# Patient Record
Sex: Female | Born: 1940 | Race: Black or African American | Hispanic: No | Smoking: Current every day smoker
Health system: Southern US, Community
[De-identification: ages and names within clinical notes are randomized; demographics above are authoritative.]

## PROBLEM LIST (undated history)

## (undated) DIAGNOSIS — E46 Unspecified protein-calorie malnutrition: Secondary | ICD-10-CM

## (undated) DIAGNOSIS — K922 Gastrointestinal hemorrhage, unspecified: Secondary | ICD-10-CM

## (undated) DIAGNOSIS — I70219 Atherosclerosis of native arteries of extremities with intermittent claudication, unspecified extremity: Secondary | ICD-10-CM

## (undated) DIAGNOSIS — D649 Anemia, unspecified: Secondary | ICD-10-CM

## (undated) DIAGNOSIS — D72829 Elevated white blood cell count, unspecified: Secondary | ICD-10-CM

## (undated) DIAGNOSIS — H409 Unspecified glaucoma: Secondary | ICD-10-CM

## (undated) DIAGNOSIS — Z720 Tobacco use: Secondary | ICD-10-CM

## (undated) DIAGNOSIS — R918 Other nonspecific abnormal finding of lung field: Secondary | ICD-10-CM

## (undated) DIAGNOSIS — I2699 Other pulmonary embolism without acute cor pulmonale: Secondary | ICD-10-CM

## (undated) DIAGNOSIS — E1129 Type 2 diabetes mellitus with other diabetic kidney complication: Secondary | ICD-10-CM

## (undated) DIAGNOSIS — Z8719 Personal history of other diseases of the digestive system: Secondary | ICD-10-CM

## (undated) DIAGNOSIS — I3139 Other pericardial effusion (noninflammatory): Secondary | ICD-10-CM

## (undated) DIAGNOSIS — I10 Essential (primary) hypertension: Secondary | ICD-10-CM

## (undated) DIAGNOSIS — C3490 Malignant neoplasm of unspecified part of unspecified bronchus or lung: Secondary | ICD-10-CM

## (undated) DIAGNOSIS — J449 Chronic obstructive pulmonary disease, unspecified: Secondary | ICD-10-CM

## (undated) DIAGNOSIS — N1831 Chronic kidney disease, stage 3a: Secondary | ICD-10-CM

## (undated) DIAGNOSIS — K219 Gastro-esophageal reflux disease without esophagitis: Secondary | ICD-10-CM

## (undated) DIAGNOSIS — E119 Type 2 diabetes mellitus without complications: Secondary | ICD-10-CM

## (undated) DIAGNOSIS — A419 Sepsis, unspecified organism: Secondary | ICD-10-CM

## (undated) DIAGNOSIS — N3289 Other specified disorders of bladder: Secondary | ICD-10-CM

## (undated) DIAGNOSIS — E876 Hypokalemia: Secondary | ICD-10-CM

## (undated) HISTORY — PX: ABDOMINAL HYSTERECTOMY: SHX81

## (undated) HISTORY — PX: BACK SURGERY: SHX140

---

## 2009-02-13 ENCOUNTER — Ambulatory Visit: Payer: Self-pay | Admitting: Family Medicine

## 2010-04-07 ENCOUNTER — Ambulatory Visit: Payer: Self-pay | Admitting: Family Medicine

## 2011-11-26 ENCOUNTER — Ambulatory Visit: Payer: Self-pay | Admitting: Family Medicine

## 2012-01-16 ENCOUNTER — Emergency Department: Payer: Self-pay | Admitting: Internal Medicine

## 2012-01-16 LAB — CBC
MCH: 29.5 pg (ref 26.0–34.0)
MCHC: 33.1 g/dL (ref 32.0–36.0)
RDW: 14.5 % (ref 11.5–14.5)

## 2012-01-16 LAB — COMPREHENSIVE METABOLIC PANEL
Albumin: 2.9 g/dL — ABNORMAL LOW (ref 3.4–5.0)
Anion Gap: 10 (ref 7–16)
BUN: 13 mg/dL (ref 7–18)
Bilirubin,Total: 0.6 mg/dL (ref 0.2–1.0)
Chloride: 97 mmol/L — ABNORMAL LOW (ref 98–107)
Creatinine: 0.99 mg/dL (ref 0.60–1.30)
EGFR (African American): >60
Potassium: 3.1 mmol/L — ABNORMAL LOW (ref 3.5–5.1)
SGPT (ALT): 22 U/L (ref 12–78)
Total Protein: 8.1 g/dL (ref 6.4–8.2)

## 2012-01-16 LAB — URINALYSIS, COMPLETE
Bilirubin,UR: NEGATIVE
Glucose,UR: NEGATIVE mg/dL (ref 0–75)
Leukocyte Esterase: NEGATIVE
RBC,UR: 15 /HPF (ref 0–5)

## 2012-01-16 LAB — CK TOTAL AND CKMB (NOT AT ARMC): CK-MB: <0.5 ng/mL — ABNORMAL LOW (ref 0.5–3.6)

## 2012-01-16 LAB — TROPONIN I: Troponin-I: <0.02 ng/mL

## 2012-01-18 LAB — URINE CULTURE

## 2012-01-22 LAB — CULTURE, BLOOD (SINGLE)

## 2012-05-09 ENCOUNTER — Encounter: Payer: Self-pay | Admitting: Family Medicine

## 2012-05-27 ENCOUNTER — Encounter: Payer: Self-pay | Admitting: Family Medicine

## 2013-09-24 IMAGING — CR PELVIS - 1-2 VIEW
1 series · 1 of 1 positions shown · non-contrast
Comparison: none

REASON FOR EXAM: fall, pain
COMMENTS:

PROCEDURE:     DXR - DXR PELVIS AP ONLY  - January 16, 2012  [DATE]
RESULT:     Single view the pelvis demonstrates degenerative changes in the
hips and sacroiliac joints with stabilization hardware the posterior lumbar
and lumbosacral regions. No fracture is evident.

[ap]
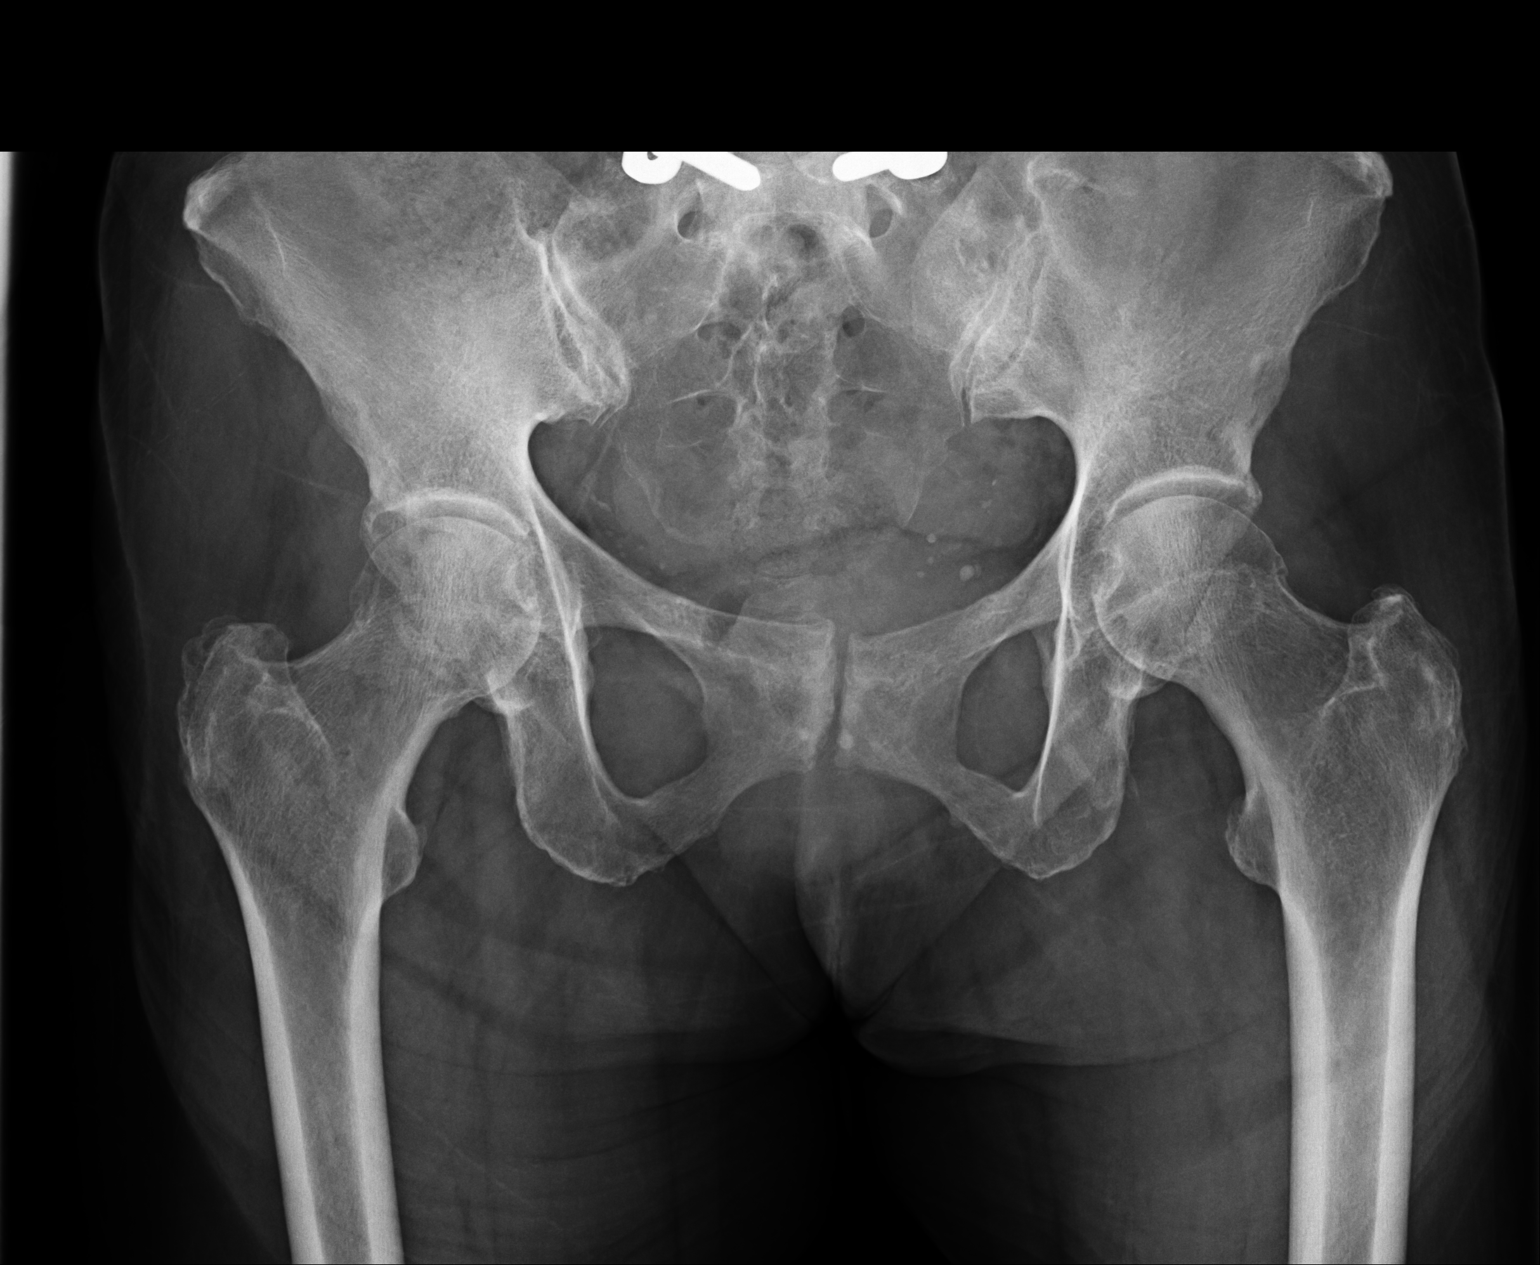

[1 of 1 positions shown; findings below may reference images not displayed]

IMPRESSION: Please see above.

[REDACTED]

## 2013-09-24 IMAGING — CR DG HIP COMPLETE 2+V*L*
1 series · 2 of 2 positions shown · non-contrast
Comparison: none

REASON FOR EXAM: fall, pain
COMMENTS:

PROCEDURE:     DXR - DXR HIP LEFT COMPLETE  - January 16, 2012  [DATE]
RESULT:     Images of the left hip demonstrate no fracture, dislocation or
foreign body. Degenerative changes are noted in the hip and in the
sacroiliac joints.

[Series 1: ap · 0.17mm/px · 2 of 2 slices shown]
[im 1/2]
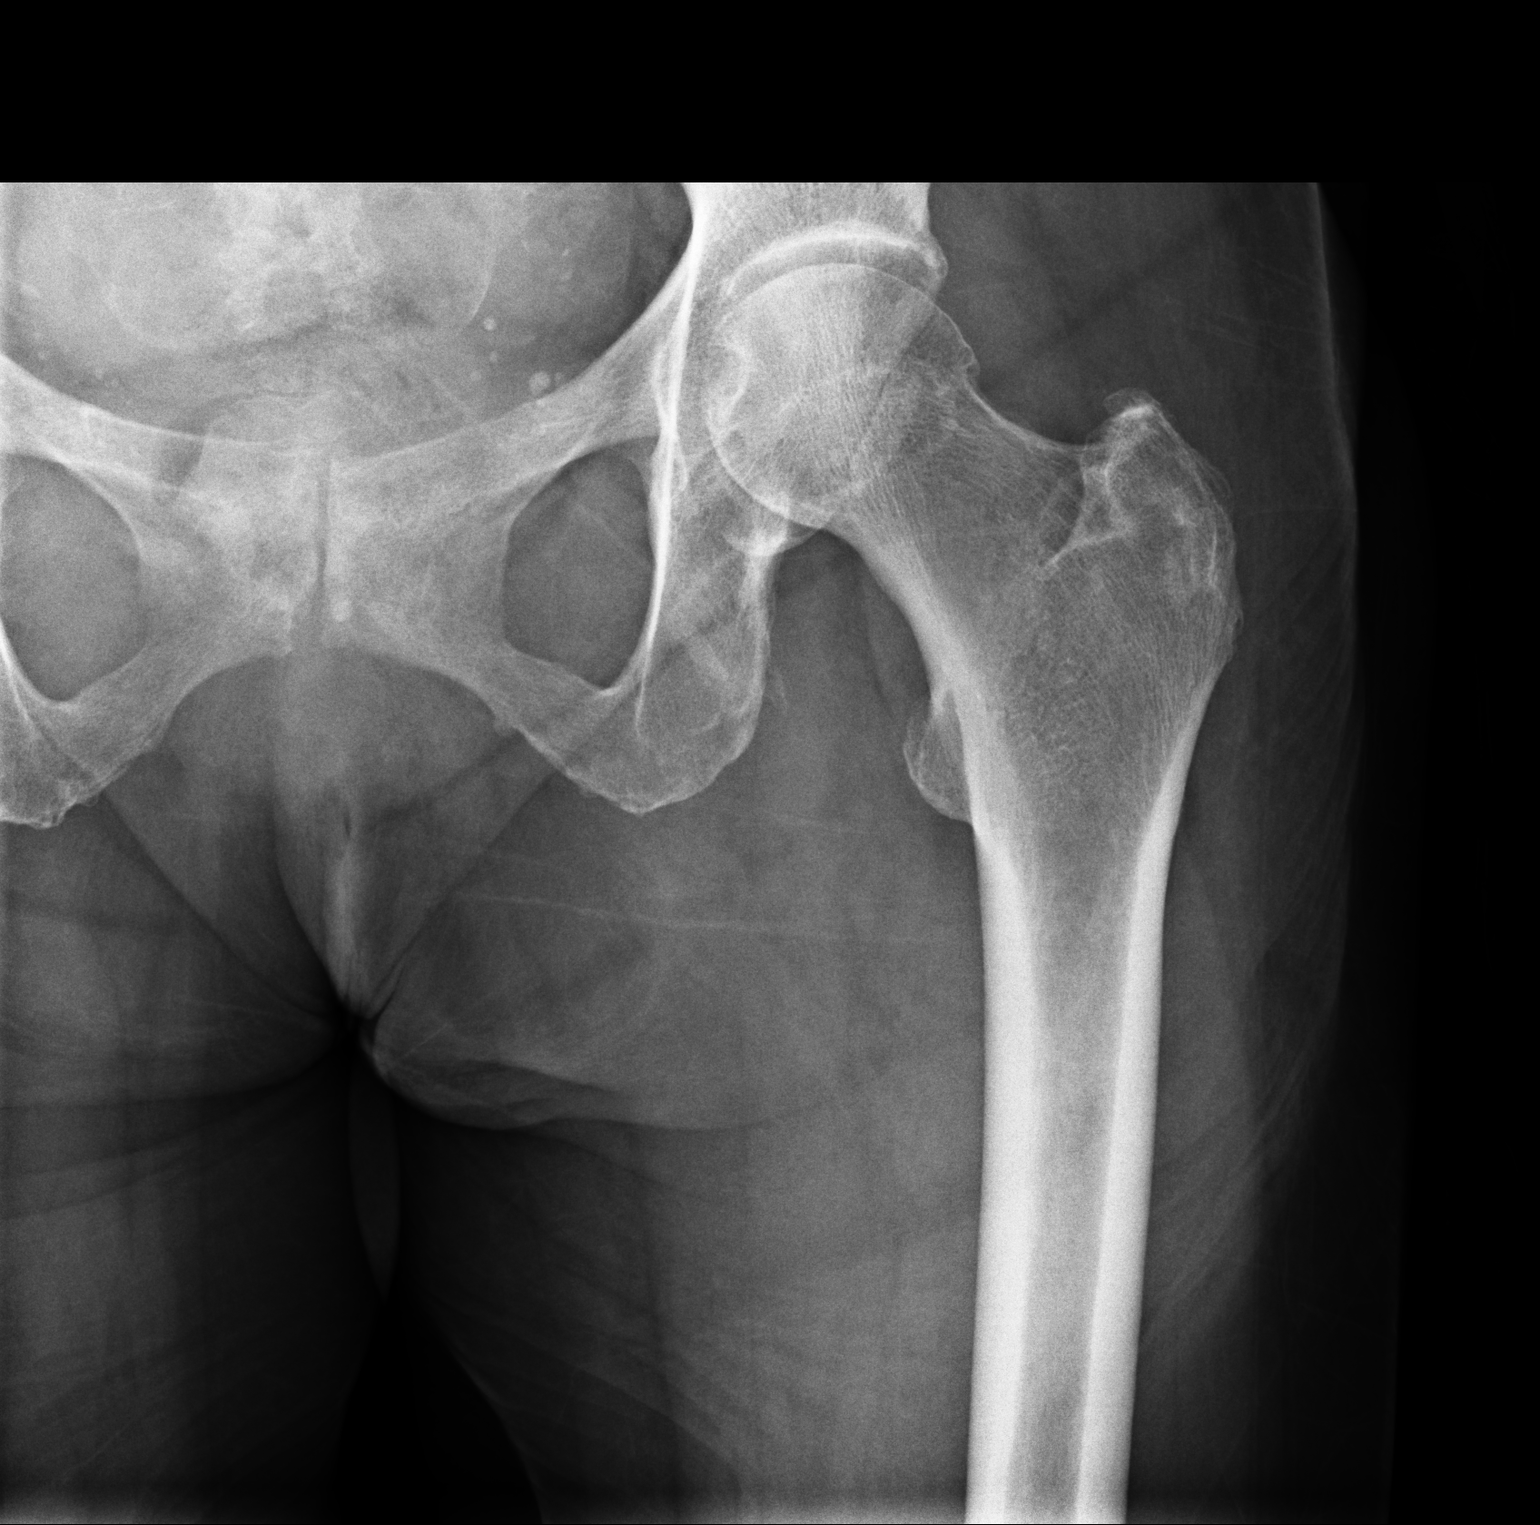
[im 2/2]
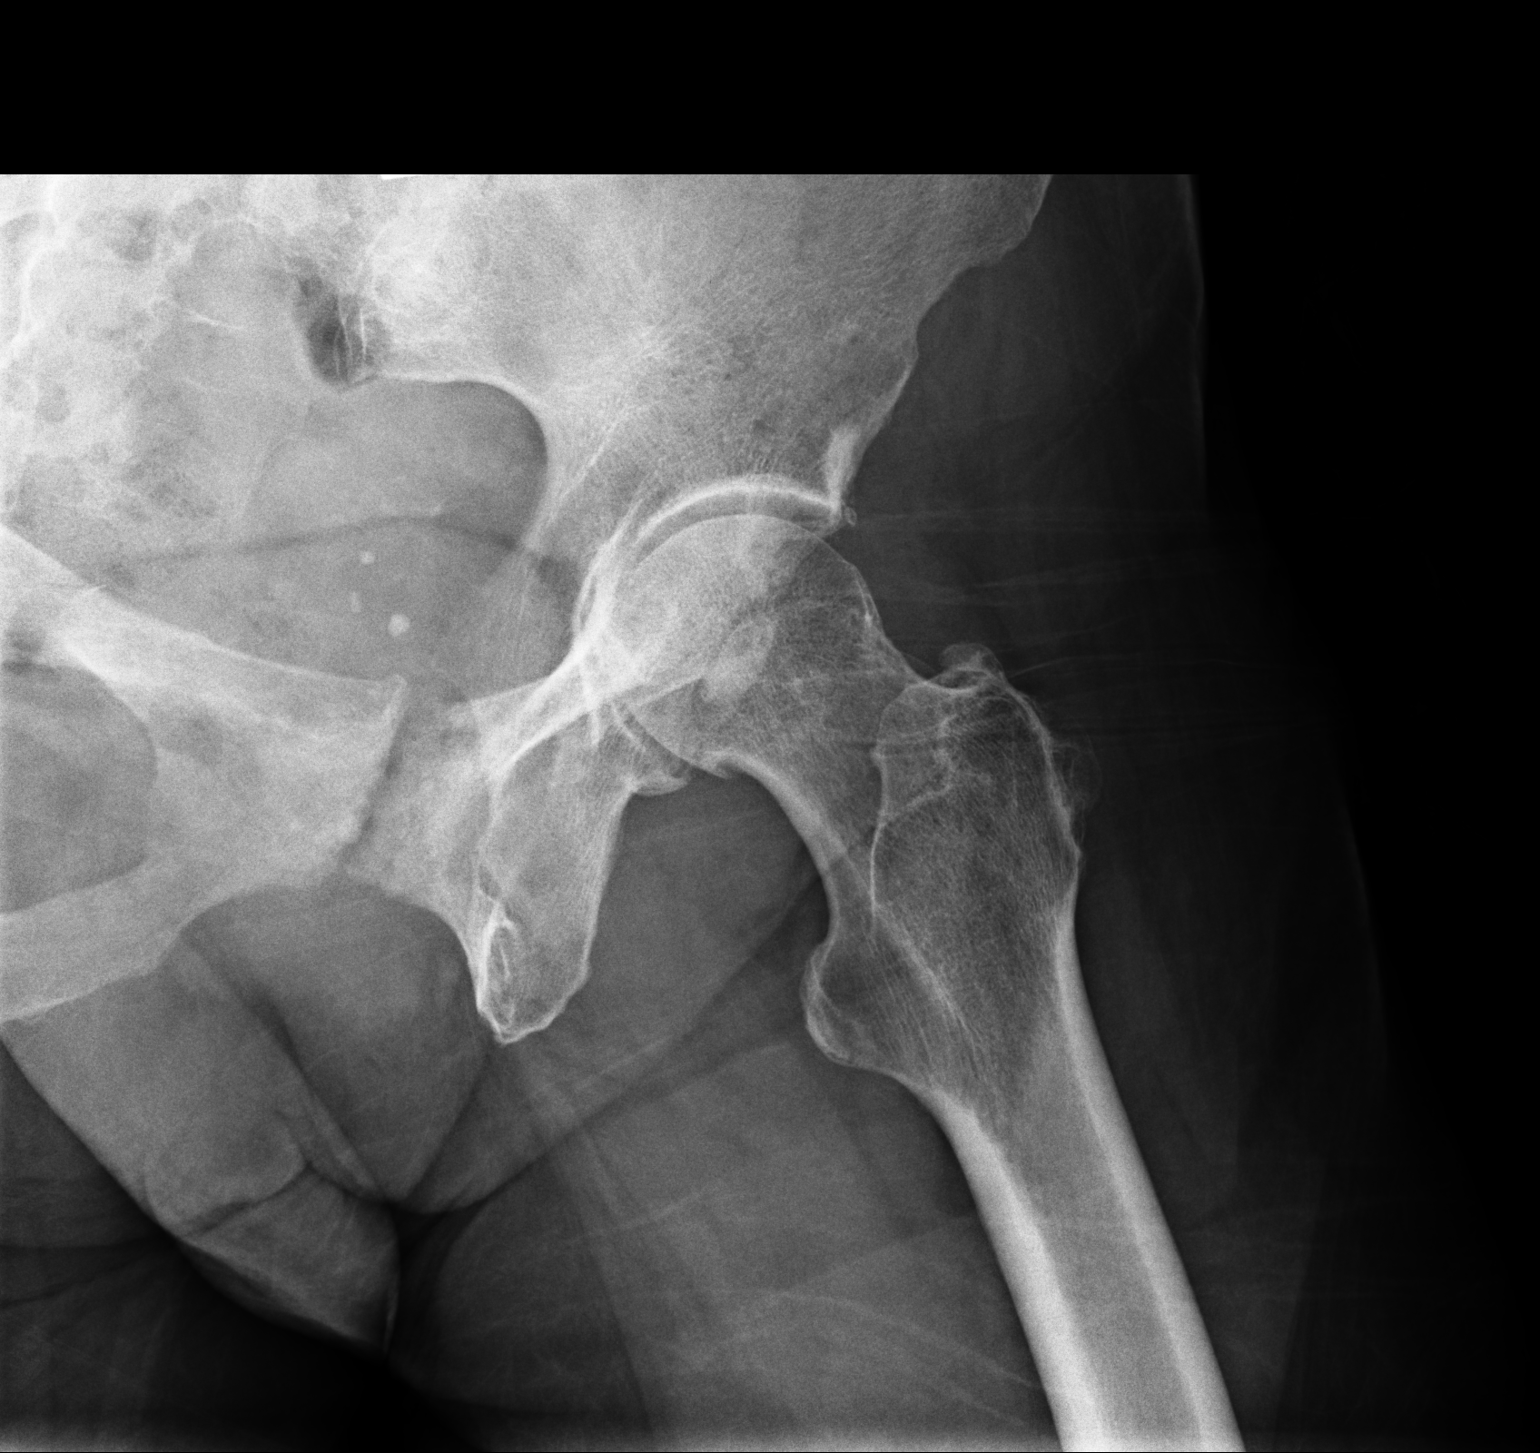

[2 of 2 positions shown; findings below may reference images not displayed]

IMPRESSION: 1. No acute left hip bony abnormality.

[REDACTED]

## 2014-02-13 ENCOUNTER — Emergency Department: Payer: Self-pay | Admitting: Emergency Medicine

## 2014-02-26 ENCOUNTER — Ambulatory Visit: Payer: Self-pay | Admitting: Family Medicine

## 2014-05-28 ENCOUNTER — Ambulatory Visit: Payer: Self-pay | Admitting: Physician Assistant

## 2014-08-01 ENCOUNTER — Ambulatory Visit
Admit: 2014-08-01 | Disposition: A | Discharge status: Home / Self Care | Payer: Self-pay | Attending: Physician Assistant | Admitting: Physician Assistant

## 2015-06-08 ENCOUNTER — Emergency Department
Admission: EM | Admit: 2015-06-08 | Discharge: 2015-06-08 | Disposition: A | Discharge status: Home / Self Care | Payer: Medicare HMO | Attending: Emergency Medicine | Admitting: Emergency Medicine

## 2015-06-08 ENCOUNTER — Encounter: Payer: Self-pay | Admitting: *Deleted

## 2015-06-08 ENCOUNTER — Emergency Department: Payer: Medicare HMO

## 2015-06-08 DIAGNOSIS — E119 Type 2 diabetes mellitus without complications: Secondary | ICD-10-CM | POA: Insufficient documentation

## 2015-06-08 DIAGNOSIS — R202 Paresthesia of skin: Secondary | ICD-10-CM | POA: Insufficient documentation

## 2015-06-08 DIAGNOSIS — R2242 Localized swelling, mass and lump, left lower limb: Secondary | ICD-10-CM | POA: Diagnosis present

## 2015-06-08 DIAGNOSIS — I10 Essential (primary) hypertension: Secondary | ICD-10-CM | POA: Insufficient documentation

## 2015-06-08 DIAGNOSIS — F17200 Nicotine dependence, unspecified, uncomplicated: Secondary | ICD-10-CM | POA: Insufficient documentation

## 2015-06-08 DIAGNOSIS — R609 Edema, unspecified: Secondary | ICD-10-CM

## 2015-06-08 DIAGNOSIS — R600 Localized edema: Secondary | ICD-10-CM | POA: Diagnosis not present

## 2015-06-08 HISTORY — DX: Type 2 diabetes mellitus without complications: E11.9

## 2015-06-08 HISTORY — DX: Essential (primary) hypertension: I10

## 2015-06-08 LAB — BASIC METABOLIC PANEL
Anion gap: 9 (ref 5–15)
BUN: 15 mg/dL (ref 6–20)
CALCIUM: 9 mg/dL (ref 8.9–10.3)
CO2: 29 mmol/L (ref 22–32)
CREATININE: 1.12 mg/dL — AB (ref 0.44–1.00)
Chloride: 103 mmol/L (ref 101–111)
GFR calc Af Amer: 55 mL/min — ABNORMAL LOW (ref >60)
GFR, EST NON AFRICAN AMERICAN: 47 mL/min — AB (ref >60)
Glucose, Bld: 191 mg/dL — ABNORMAL HIGH (ref 65–99)
Potassium: 3.2 mmol/L — ABNORMAL LOW (ref 3.5–5.1)
SODIUM: 141 mmol/L (ref 135–145)

## 2015-06-08 LAB — CBC
HEMATOCRIT: 34.3 % — AB (ref 35.0–47.0)
Hemoglobin: 11.4 g/dL — ABNORMAL LOW (ref 12.0–16.0)
MCH: 29.3 pg (ref 26.0–34.0)
MCHC: 33.3 g/dL (ref 32.0–36.0)
MCV: 88 fL (ref 80.0–100.0)
Platelets: 247 10*3/uL (ref 150–440)
RBC: 3.89 MIL/uL (ref 3.80–5.20)
RDW: 16.2 % — AB (ref 11.5–14.5)
WBC: 6.4 10*3/uL (ref 3.6–11.0)

## 2015-06-08 NOTE — ED Notes (Signed)
States she woke up this AM with left leg swelling, denies any known injury, states some tingling in her leg

## 2015-06-08 NOTE — Discharge Instructions (Signed)

## 2015-06-08 NOTE — ED Provider Notes (Addendum)
[facility] Emergency Department Provider Note  ____________________________________________  Time seen: Approximately 405 PM  I have reviewed the triage vital signs and the nursing notes.   HISTORY  Chief Complaint Leg Swelling    HPI Claudina L Wohlford is a 75 y.o. female with a history of hypertension and diabetes who is presenting today with left lower summary swelling. She said that she woke up with swelling to her foot or ankle in her calf. 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 She says that her knee does periodically swell but usually goes down after several hours. She says that she usually does not have any swelling like this. She says that there is some mild tingling but no pain. No injury recently.   Past Medical History  Diagnosis Date  . Hypertension   . Diabetes mellitus without complication ([place])     There are no active problems to display for this patient.   History reviewed. No pertinent past surgical history.  No current outpatient prescriptions on file.  Allergies Ivp dye  History reviewed. No pertinent family history.  Social History Social History  Substance Use Topics  . Smoking status: Current Every Day Smoker  . Smokeless tobacco: None  . Alcohol Use: None    Review of Systems Constitutional: No fever/chills Eyes: No visual changes. ENT: No sore throat. Cardiovascular: Denies chest pain. Respiratory: Denies shortness of breath. Gastrointestinal: No abdominal pain.  No nausea, no vomiting.  No diarrhea.  No constipation. Genitourinary: Negative for dysuria. Musculoskeletal: Negative for back pain. Skin: Negative for rash. Neurological: Negative for headaches, focal weakness or numbness.  10-point ROS otherwise negative.  ____________________________________________   PHYSICAL EXAM:  VITAL SIGNS: ED Triage Vitals  Enc Vitals Group     BP 06/08/15 1315 151/67 mmHg   Pulse Rate 06/08/15 1315 69     Resp 06/08/15 1315 18     Temp 06/08/15 1315 98 F (36.7 C)     Temp Source 06/08/15 1315 Oral     SpO2 06/08/15 1315 97 %     Weight 06/08/15 1315 142 lb (64.411 kg)     Height 06/08/15 1315 '5\' 3"'$  (1.6 m)     Head Cir --      Peak Flow --      Pain Score --      Pain Loc --      Pain Edu? --      Excl. in [place]? --     Constitutional: Alert and oriented. Well appearing and in no acute distress. Eyes: Conjunctivae are normal. PERRL. EOMI. Head: Atraumatic. Nose: No congestion/rhinnorhea. Mouth/Throat: Mucous membranes are moist.   Neck: No stridor.   Cardiovascular: Normal rate, regular rhythm. Grossly normal heart sounds.  Good peripheral circulation. Respiratory: Normal respiratory effort.  No retractions. Lungs CTAB. Gastrointestinal: Soft and nontender. No distention. No abdominal bruits. No CVA tenderness. Musculoskeletal: Pitting edema to the left foot, ankle and calf up to just distal to the knee. There is no tenderness to the knee. There is no tenderness or deformity ankle. There is no tenderness in the foot. There are palpable dorsalis pedis pulse bilaterally and the patient is able to range her feet and ankles and knees and full range of motion actively without any pain. Sensation intact to light touch. Neurologic:  Normal speech and language. No gross focal neurologic deficits are appreciated. No gait instability. Skin:  Skin is warm, dry and intact. No rash noted.  Psychiatric: Mood and affect are normal. Speech and behavior are normal.  ____________________________________________   LABS (all labs ordered are listed, but only abnormal results are displayed)  Labs Reviewed  BASIC METABOLIC PANEL - Abnormal; Notable for the following:    Potassium 3.2 (*)    Glucose, Bld 191 (*)    Creatinine, Ser 1.12 (*)    GFR calc non Af Amer 47 (*)    GFR calc Af Amer 55 (*)    All other components within normal limits  CBC - Abnormal; Notable for  the following:    Hemoglobin 11.4 (*)    HCT 34.3 (*)    RDW 16.2 (*)    All other components within normal limits   ____________________________________________  EKG   ____________________________________________  RADIOLOGY   IMPRESSION: No evidence of deep venous thrombosis. ____________________________________________   PROCEDURES  ____________________________________________   INITIAL IMPRESSION / ASSESSMENT AND PLAN / ED COURSE  Pertinent labs & imaging results that were available during my care of the patient were reviewed by me and considered in my medical decision making (see chart for details). ----------------------------------------- 5:27 PM on 06/08/2015 -----------------------------------------   Patient resting comfortably at this time. I reviewed with her her imaging results without any evidence for deep venous thrombosis. Possible routine swelling from her arthritis which has become dependent intractable down to the lower portion of her leg. Advised her to use pillows under the leg while sleeping. She says that she is very active and does a lot of walking solution to help redistribute the fluid as well. She knows to return to the emergency department for any worsening or concerning symptoms. Will be discharged to home. ____________________________________________   FINAL CLINICAL IMPRESSION(S) / ED DIAGNOSES  Peripheral edema.    Orbie Pyo, MD 06/08/15 1728 Also denied any recent hospitalization, long car/plane trips.   Orbie Pyo, MD 06/08/15 [id number]

## 2015-06-08 NOTE — ED Notes (Signed)
Pt transported to ultrasound.

## 2015-12-30 ENCOUNTER — Emergency Department: Payer: Medicare HMO

## 2015-12-30 ENCOUNTER — Encounter: Payer: Self-pay | Admitting: Emergency Medicine

## 2015-12-30 ENCOUNTER — Inpatient Hospital Stay
Admission: EM | Admit: 2015-12-30 | Discharge: 2016-01-01 | DRG: 871 | Disposition: A | Discharge status: Home / Self Care | Payer: Medicare HMO | Attending: Internal Medicine | Admitting: Internal Medicine

## 2015-12-30 DIAGNOSIS — A419 Sepsis, unspecified organism: Secondary | ICD-10-CM | POA: Diagnosis present

## 2015-12-30 DIAGNOSIS — E119 Type 2 diabetes mellitus without complications: Secondary | ICD-10-CM | POA: Diagnosis present

## 2015-12-30 DIAGNOSIS — J322 Chronic ethmoidal sinusitis: Secondary | ICD-10-CM | POA: Diagnosis present

## 2015-12-30 DIAGNOSIS — J0120 Acute ethmoidal sinusitis, unspecified: Secondary | ICD-10-CM | POA: Diagnosis present

## 2015-12-30 DIAGNOSIS — Z8249 Family history of ischemic heart disease and other diseases of the circulatory system: Secondary | ICD-10-CM | POA: Diagnosis not present

## 2015-12-30 DIAGNOSIS — F17210 Nicotine dependence, cigarettes, uncomplicated: Secondary | ICD-10-CM | POA: Diagnosis present

## 2015-12-30 DIAGNOSIS — Z7984 Long term (current) use of oral hypoglycemic drugs: Secondary | ICD-10-CM | POA: Diagnosis not present

## 2015-12-30 DIAGNOSIS — J9601 Acute respiratory failure with hypoxia: Secondary | ICD-10-CM | POA: Diagnosis present

## 2015-12-30 DIAGNOSIS — I248 Other forms of acute ischemic heart disease: Secondary | ICD-10-CM | POA: Diagnosis present

## 2015-12-30 DIAGNOSIS — R112 Nausea with vomiting, unspecified: Secondary | ICD-10-CM

## 2015-12-30 DIAGNOSIS — R6520 Severe sepsis without septic shock: Secondary | ICD-10-CM | POA: Diagnosis present

## 2015-12-30 DIAGNOSIS — I10 Essential (primary) hypertension: Secondary | ICD-10-CM | POA: Diagnosis present

## 2015-12-30 DIAGNOSIS — G9341 Metabolic encephalopathy: Secondary | ICD-10-CM | POA: Diagnosis present

## 2015-12-30 DIAGNOSIS — Z833 Family history of diabetes mellitus: Secondary | ICD-10-CM

## 2015-12-30 DIAGNOSIS — E876 Hypokalemia: Secondary | ICD-10-CM | POA: Diagnosis present

## 2015-12-30 DIAGNOSIS — Z91041 Radiographic dye allergy status: Secondary | ICD-10-CM | POA: Diagnosis not present

## 2015-12-30 DIAGNOSIS — Z79899 Other long term (current) drug therapy: Secondary | ICD-10-CM

## 2015-12-30 LAB — COMPREHENSIVE METABOLIC PANEL
ALT: 18 U/L (ref 14–54)
ANION GAP: 15 (ref 5–15)
AST: 24 U/L (ref 15–41)
Albumin: 3.2 g/dL — ABNORMAL LOW (ref 3.5–5.0)
Alkaline Phosphatase: 67 U/L (ref 38–126)
BUN: 11 mg/dL (ref 6–20)
CHLORIDE: 91 mmol/L — AB (ref 101–111)
CO2: 25 mmol/L (ref 22–32)
CREATININE: 0.98 mg/dL (ref 0.44–1.00)
Calcium: 8.8 mg/dL — ABNORMAL LOW (ref 8.9–10.3)
GFR, EST NON AFRICAN AMERICAN: 55 mL/min — AB (ref >60)
Glucose, Bld: 235 mg/dL — ABNORMAL HIGH (ref 65–99)
Potassium: 2.7 mmol/L — CL (ref 3.5–5.1)
SODIUM: 131 mmol/L — AB (ref 135–145)
Total Bilirubin: 1.1 mg/dL (ref 0.3–1.2)
Total Protein: 7.8 g/dL (ref 6.5–8.1)

## 2015-12-30 LAB — URINALYSIS COMPLETE WITH MICROSCOPIC (ARMC ONLY)
Bacteria, UA: NONE SEEN
Bilirubin Urine: NEGATIVE
Leukocytes, UA: NEGATIVE
NITRITE: NEGATIVE
Protein, ur: 30 mg/dL — AB
SPECIFIC GRAVITY, URINE: 1.005 (ref 1.005–1.030)
pH: 7 (ref 5.0–8.0)

## 2015-12-30 LAB — CBC WITH DIFFERENTIAL/PLATELET
BASOS PCT: 0 %
Basophils Absolute: 0.1 10*3/uL (ref 0–0.1)
EOS ABS: 0 10*3/uL (ref 0–0.7)
Eosinophils Relative: 0 %
HEMATOCRIT: 33.2 % — AB (ref 35.0–47.0)
HEMOGLOBIN: 11.4 g/dL — AB (ref 12.0–16.0)
LYMPHS ABS: 0.4 10*3/uL — AB (ref 1.0–3.6)
Lymphocytes Relative: 2 %
MCH: 29.6 pg (ref 26.0–34.0)
MCHC: 34.5 g/dL (ref 32.0–36.0)
MCV: 85.8 fL (ref 80.0–100.0)
MONOS PCT: 3 %
Monocytes Absolute: 0.5 10*3/uL (ref 0.2–0.9)
NEUTROS ABS: 19.5 10*3/uL — AB (ref 1.4–6.5)
NEUTROS PCT: 95 %
Platelets: 365 10*3/uL (ref 150–440)
RBC: 3.87 MIL/uL (ref 3.80–5.20)
RDW: 15.3 % — ABNORMAL HIGH (ref 11.5–14.5)
WBC: 20.4 10*3/uL — AB (ref 3.6–11.0)

## 2015-12-30 LAB — POTASSIUM: Potassium: 2.9 mmol/L — ABNORMAL LOW (ref 3.5–5.1)

## 2015-12-30 LAB — BLOOD GAS, VENOUS
PH VEN: 7.53 — AB (ref 7.250–7.430)
Patient temperature: 37
pCO2, Ven: 34 mmHg — ABNORMAL LOW (ref 44.0–60.0)
pO2, Ven: 36 mmHg (ref 32.0–45.0)

## 2015-12-30 LAB — MAGNESIUM: MAGNESIUM: 1.4 mg/dL — AB (ref 1.7–2.4)

## 2015-12-30 LAB — LACTIC ACID, PLASMA: Lactic Acid, Venous: 1.5 mmol/L (ref 0.5–1.9)

## 2015-12-30 LAB — TROPONIN I
TROPONIN I: 0.1 ng/mL — AB (ref <0.03)
TROPONIN I: 0.38 ng/mL — AB (ref <0.03)
Troponin I: 0.53 ng/mL (ref <0.03)

## 2015-12-30 MED ORDER — PIPERACILLIN-TAZOBACTAM 3.375 G IVPB 30 MIN
3.3750 g | Freq: Once | INTRAVENOUS | Status: AC
  Administered 2015-12-30: 3.375 g via INTRAVENOUS
  Filled 2015-12-30: qty 50

## 2015-12-30 MED ORDER — ACETAMINOPHEN 325 MG PO TABS
650.0000 mg | ORAL_TABLET | Freq: Four times a day (QID) | ORAL | Status: DC | PRN
  Administered 2015-12-31 (×2): 650 mg via ORAL
  Filled 2015-12-30: qty 2

## 2015-12-30 MED ORDER — ENOXAPARIN SODIUM 40 MG/0.4ML ~~LOC~~ SOLN
40.0000 mg | SUBCUTANEOUS | Status: DC
  Administered 2015-12-30: 40 mg via SUBCUTANEOUS
  Filled 2015-12-30: qty 0.4

## 2015-12-30 MED ORDER — ACETAMINOPHEN 650 MG RE SUPP
650.0000 mg | Freq: Once | RECTAL | Status: AC
  Administered 2015-12-30: 650 mg via RECTAL
  Filled 2015-12-30: qty 1

## 2015-12-30 MED ORDER — POTASSIUM CHLORIDE 10 MEQ/100ML IV SOLN
10.0000 meq | INTRAVENOUS | Status: AC
  Administered 2015-12-30 (×3): 10 meq via INTRAVENOUS
  Filled 2015-12-30 (×4): qty 100

## 2015-12-30 MED ORDER — SODIUM CHLORIDE 0.9 % IV BOLUS (SEPSIS)
1000.0000 mL | Freq: Once | INTRAVENOUS | Status: AC
  Administered 2015-12-30: 1000 mL via INTRAVENOUS

## 2015-12-30 MED ORDER — VANCOMYCIN HCL IN DEXTROSE 1-5 GM/200ML-% IV SOLN
1000.0000 mg | Freq: Once | INTRAVENOUS | Status: AC
  Administered 2015-12-30: 1000 mg via INTRAVENOUS
  Filled 2015-12-30: qty 200

## 2015-12-30 MED ORDER — PIPERACILLIN-TAZOBACTAM 3.375 G IVPB 30 MIN
3.3750 g | Freq: Three times a day (TID) | INTRAVENOUS | Status: DC
  Administered 2015-12-30 – 2015-12-31 (×2): 3.375 g via INTRAVENOUS
  Filled 2015-12-30 (×5): qty 50

## 2015-12-30 MED ORDER — CLONIDINE HCL 0.1 MG/24HR TD PTWK
0.1000 mg | MEDICATED_PATCH | TRANSDERMAL | Status: DC
  Administered 2015-12-30: 0.1 mg via TRANSDERMAL
  Filled 2015-12-30: qty 1

## 2015-12-30 MED ORDER — SODIUM CHLORIDE 0.9 % IV SOLN
INTRAVENOUS | Status: DC
  Administered 2015-12-30: 16:00:00 via INTRAVENOUS

## 2015-12-30 MED ORDER — METOPROLOL TARTRATE 5 MG/5ML IV SOLN
5.0000 mg | Freq: Once | INTRAVENOUS | Status: AC
  Administered 2015-12-30: 5 mg via INTRAVENOUS
  Filled 2015-12-30: qty 5

## 2015-12-30 MED ORDER — ONDANSETRON HCL 4 MG PO TABS: 4.0000 mg | ORAL_TABLET | Freq: Four times a day (QID) | ORAL | Status: DC | PRN

## 2015-12-30 MED ORDER — VANCOMYCIN HCL IN DEXTROSE 1-5 GM/200ML-% IV SOLN
1000.0000 mg | INTRAVENOUS | Status: DC
  Administered 2015-12-30: 1000 mg via INTRAVENOUS
  Filled 2015-12-30 (×3): qty 200

## 2015-12-30 MED ORDER — METOPROLOL TARTRATE 5 MG/5ML IV SOLN
10.0000 mg | Freq: Once | INTRAVENOUS | Status: AC
  Administered 2015-12-30: 10 mg via INTRAVENOUS
  Filled 2015-12-30: qty 10

## 2015-12-30 MED ORDER — LABETALOL HCL 5 MG/ML IV SOLN: 20.0000 mg | INTRAVENOUS | Status: DC | PRN

## 2015-12-30 MED ORDER — ONDANSETRON HCL 4 MG/2ML IJ SOLN: 4.0000 mg | Freq: Four times a day (QID) | INTRAMUSCULAR | Status: DC | PRN

## 2015-12-30 MED ORDER — ACETAMINOPHEN 650 MG RE SUPP: 650.0000 mg | Freq: Four times a day (QID) | RECTAL | Status: DC | PRN

## 2015-12-30 MED ORDER — PIPERACILLIN-TAZOBACTAM 3.375 G IVPB: 3.3750 g | Freq: Three times a day (TID) | INTRAVENOUS | Status: DC

## 2015-12-30 MED ORDER — NITROGLYCERIN 0.1 MG/HR TD PT24
0.1000 mg | MEDICATED_PATCH | Freq: Every day | TRANSDERMAL | Status: DC
  Administered 2015-12-31: 0.1 mg via TRANSDERMAL
  Filled 2015-12-30 (×3): qty 1

## 2015-12-30 NOTE — Progress Notes (Signed)
Pharmacy Antibiotic Note  Regina Perkins is a 75 y.o. female admitted on 12/30/2015 with sepsis.  Pharmacy has been consulted for vancomycin & piperacillin/tazobactam dosing.  Plan: Piperacillin/tazobactam 3.375 g IV q8h EI  Patient received vancomycin 1000 mg dose in ED. Will follow with vancomyci 1000 mg IV q24h (beginning at 2000 tonight for a 7 hour stacked dose) Goal vancomycin trough 15-20 mcg/mL Vancomycin trough ordered for 9/7 @ 1930 which is prior to 4th dose  Kinetics: Using actual body weight of 63.5 kg and CrCl of 49 mL/min Ke: 0.045 Half-life: ~16 hours Vd: 45 L  Cmin (estimated) ~15 mcg/mL  Height: '5\' 6"'$  (167.6 cm) Weight: 140 lb (63.5 kg) IBW/kg (Calculated) : 59.3  Temp (24hrs), Avg:103.2 F (39.6 C), Min:103.2 F (39.6 C), Max:103.2 F (39.6 C)   Recent Labs Lab 12/30/15 1142  WBC 20.4*  CREATININE 0.98  LATICACIDVEN 1.5    Estimated Creatinine Clearance: 46.4 mL/min (by C-G formula based on SCr of 0.98 mg/dL).    Allergies  Allergen Reactions  . Ivp Dye [Iodinated Diagnostic Agents]   . Shellfish Allergy Other (See Comments)    Reaction: unknown    Antimicrobials this admission: vancomycin 9/5 >>  Piperacillin/tazobactam 9/5 >>   Dose adjustments this admission:  Microbiology results: 9/5 BCx: Sent 9/5 UCx: Sent   Thank you for allowing pharmacy to be a part of this patient's care.  Lenis Noon, PharmD, BCPS Clinical Pharmacist 12/30/2015 3:52 PM

## 2015-12-30 NOTE — H&P (Signed)
[place] [facility] [place] NAME: Regina Perkins    MR#:  [id number]  DATE OF BIRTH:  [birth year]  DATE OF ADMISSION:  12/30/2015  PRIMARY CARE PHYSICIAN: [facility]   REQUESTING/REFERRING PHYSICIAN: Dr. Quentin Cornwall  CHIEF COMPLAINT:  Altered mental status  HISTORY OF PRESENT ILLNESS:  Regina Perkins  is a 75 y.o. female with a known history of Hypertension and diabetes mellitus is brought into the emergency department by EMS for altered mental status. Patient was found to be lethargic and less responsive by the family members. Initial vital signs by EMS for blood pressure at 195/100 with CBG at 207. According to family members patient was spitting up phlegm and reporting left ear pain and eventually became less responsive. In the emergency department patient was found to be febrile with temperature 103, tachycardic with heart rate at 129, white count is elevated at 20.4. Troponin is at 0.10 and potassium at 2.7. Patient was given broad-spectrum IV antibiotics after blood and urine cultures were obtained. Patient was opening her eyes during my examination to verbal commands but not following commands  PAST MEDICAL HISTORY:   Past Medical History:  Diagnosis Date  . Diabetes mellitus without complication ([place])   . Hypertension     PAST SURGICAL HISTOIRY:  History reviewed. No pertinent surgical history.  SOCIAL HISTORY:   Social History  Substance Use Topics  . Smoking status: Current Every Day Smoker    Packs/day: 0.50    Types: Cigarettes  . Smokeless tobacco: Never Used  . Alcohol use No    FAMILY HISTORY:  Diabetes and hypertension runs in the family  DRUG ALLERGIES:   Allergies  Allergen Reactions  . Ivp Dye [Iodinated Diagnostic Agents]   . Shellfish Allergy Other (See Comments)    Reaction: unknown     REVIEW OF SYSTEMS:  Review of systems unobtainable as the patient is with altered mental status  MEDICATIONS  AT HOME:   Prior to Admission medications   Medication Sig Start Date End Date Taking? Authorizing Provider  cloNIDine (CATAPRES) 0.1 MG tablet Take 0.1 mg by mouth 2 (two) times daily.   Yes Historical Provider, MD  gabapentin (NEURONTIN) 300 MG capsule Take 300 mg by mouth 3 (three) times daily.   Yes Historical Provider, MD  ibuprofen (ADVIL,MOTRIN) 800 MG tablet Take 800 mg by mouth every 8 (eight) hours as needed.   Yes Historical Provider, MD  lisinopril-hydrochlorothiazide (PRINZIDE,ZESTORETIC) 20-12.5 MG tablet Take 2 tablets by mouth daily.   Yes Historical Provider, MD  metFORMIN (GLUCOPHAGE) 1000 MG tablet Take 1,000 mg by mouth 2 (two) times daily with a meal.   Yes Historical Provider, MD  metoprolol tartrate (LOPRESSOR) 25 MG tablet Take 25 mg by mouth 2 (two) times daily.   Yes Historical Provider, MD      VITAL SIGNS:  Blood pressure (!) 196/94, pulse (!) 102, temperature (!) 103.2 F (39.6 C), temperature source Oral, resp. rate 14, height '5\' 6"'$  (1.676 m), weight 63.5 kg (140 lb), SpO2 99 %.  PHYSICAL EXAMINATION:  GENERAL:  75 y.o.-year-old patient lying in the bed with no acute distress.  EYES: Pupils equal, round, reactive to light and accommodation. No scleral icterus. HEENT: Head atraumatic, normocephalic. Oropharynx and nasopharynx clear. Left ear impacted with cerumen NECK:  Supple, no jugular venous distention. No thyroid enlargement, no tenderness.  LUNGS: Normal breath sounds bilaterally, no wheezing, rales,rhonchi or crepitation. No use of accessory muscles of respiration.  CARDIOVASCULAR:  S1, S2 normal. No murmurs, rubs, or gallops.  ABDOMEN: Soft, nontender, nondistended. Bowel sounds present. No organomegaly or mass.  EXTREMITIES: No pedal edema, cyanosis, or clubbing.  NEUROLOGIC: Lethargic, arousable to verbal commands , but not following verbal commands PSYCHIATRIC: The patient is lethargic SKIN: No obvious rash, lesion, or ulcer.   LABORATORY PANEL:    CBC  Recent Labs Lab 12/30/15 1142  WBC 20.4*  HGB 11.4*  HCT 33.2*  PLT 365   ------------------------------------------------------------------------------------------------------------------  Chemistries   Recent Labs Lab 12/30/15 1142  NA 131*  K 2.7*  CL 91*  CO2 25  GLUCOSE 235*  BUN 11  CREATININE 0.98  CALCIUM 8.8*  AST 24  ALT 18  ALKPHOS 67  BILITOT 1.1   ------------------------------------------------------------------------------------------------------------------  Cardiac Enzymes  Recent Labs Lab 12/30/15 1142  TROPONINI 0.10*   ------------------------------------------------------------------------------------------------------------------  RADIOLOGY:  Dg Chest Port 1 View  Result Date: 12/30/2015 CLINICAL DATA:  Altered mental status.  Hypertension. EXAM: PORTABLE CHEST 1 VIEW COMPARISON:  February 26, 2014 FINDINGS: There is no edema or consolidation. Heart is mildly enlarged with pulmonary vascular within normal limits. No adenopathy. No bone lesions. IMPRESSION: Mild cardiac enlargement.  No edema or consolidation. Electronically Signed   By: Lowella Grip III M.D.   On: 12/30/2015 12:30    EKG:   Orders placed or performed during the hospital encounter of 12/30/15  . ED EKG 12-Lead  . ED EKG 12-Lead  . EKG 12-Lead  . EKG 12-Lead    IMPRESSION AND PLAN:   Regina Perkins  is a 75 y.o. female with a known history of Hypertension and diabetes mellitus is brought into the emergency department by EMS for altered mental status. Patient was found to be lethargic and less responsive by the family members. Initial vital signs by EMS for blood pressure at 195/100 with CBG at 207. According to family members patient was spitting up phlegm and reporting left ear pain  #Metabolic encephalopathy secondary to Sepsis unclear source Meets criteria with fever, tachycardia and leukocytosis Pancultures were obtained, chest x-ray negative Continue  broad-spectrum IV antibiotics Zosyn and vancomycin which was started in the emergency department IV fluids, nothing by mouth Follow-up on the culture results Neuro checks  #Malignant hypertension Currently patient is nothing by mouth because of encephalopathy secondary to sepsis Provide IV labetalol If necessary we will add Nitropatch on clonidine patch  #Elevated troponin can be from demand ischemia from sepsis and malignant hypertension Monitor troponins closely Monitor patient on telemetry Will consider cardiology consult and the therapeutic dose of Lovenox if troponin is trending  #Diabetes mellitus Currently patient is nothing by mouth provide sliding scale insulin   #Hypokalemia-replete 2.7 Replete potassium and check magnesium and potassium    All the records are reviewed and case discussed with ED provider. Management plans discussed with the patient, family and they are in agreement.  CODE STATUS: Full code, son Mr. Coralyn Mark and granddaughter Starling Manns are the healthcare power of attorney  TOTAL TIME TAKING CARE OF THIS PATIENT: 45  minutes.   Note: This dictation was prepared with Dragon dictation along with smaller phrase technology. Any transcriptional errors that result from this process are unintentional.  Regina Perkins M.D on 12/30/2015 at 3:22 PM  Between 7am to 6pm - Pager - [phone]  After 6pm go to [email or web address] - password [email or web address]  [facility]  Office  [phone]  CC: Primary care physician; [place]

## 2015-12-30 NOTE — ED Provider Notes (Signed)
[facility] Emergency Department Provider Note    First MD Initiated Contact with Patient 12/30/15 1135     (approximate)  I have reviewed the triage vital signs and the nursing notes.   HISTORY  Chief Complaint Altered Mental Status and Code Sepsis    HPI Regina Perkins is a 75 y.o. female full code with history of diabetes and hypertension who presents with altered metal status, nausea vomiting and diarrhea this morning. Granddaughter found patient and she had swallowed herself multiple times as well as multiple episodes of nonbilious nonbloody vomiting. Has also reported a productive cough for the past 2 days. No recent admissions to the hospital. No previous CVA. No new medication changes. Patient currently acutely encephalopathic and I'm unable to provide history at this time.   Past Medical History:  Diagnosis Date  . Diabetes mellitus without complication ([place])   . Hypertension     There are no active problems to display for this patient.   PSH: unable to obtain due to ams  Prior to Admission medications   Medication Sig Start Date End Date Taking? Authorizing Provider  cloNIDine (CATAPRES) 0.1 MG tablet Take 0.1 mg by mouth 2 (two) times daily.   Yes Historical Provider, MD  gabapentin (NEURONTIN) 300 MG capsule Take 300 mg by mouth 3 (three) times daily.   Yes Historical Provider, MD  ibuprofen (ADVIL,MOTRIN) 800 MG tablet Take 800 mg by mouth every 8 (eight) hours as needed.   Yes Historical Provider, MD  lisinopril-hydrochlorothiazide (PRINZIDE,ZESTORETIC) 20-12.5 MG tablet Take 2 tablets by mouth daily.   Yes Historical Provider, MD  metFORMIN (GLUCOPHAGE) 1000 MG tablet Take 1,000 mg by mouth 2 (two) times daily with a meal.   Yes Historical Provider, MD  metoprolol tartrate (LOPRESSOR) 25 MG tablet Take 25 mg by mouth 2 (two) times daily.   Yes Historical Provider, MD    Allergies Ivp dye [iodinated diagnostic agents] and Shellfish  allergy  FMH: unable to obtain 2/2 AMS  Social History Social History  Substance Use Topics  . Smoking status: Current Every Day Smoker    Packs/day: 0.50    Types: Cigarettes  . Smokeless tobacco: Never Used  . Alcohol use No    Review of Systems Unable to obtain 2/2 AMS ____________________________________________   PHYSICAL EXAM:  VITAL SIGNS: Vitals:   12/30/15 1400 12/30/15 1415  BP: (!) 196/88 (!) 196/94  Pulse: (!) 101 (!) 102  Resp: (!) 26 14  Temp:      Constitutional: Critically ill appearing. Will open eyes spontaneously. Follows simple commands. Currently nonverbal. To And in moderate respiratory distress Eyes: Conjunctivae are normal. PERRL. EOMI. Head: Atraumatic. Nose: No congestion/rhinnorhea. Mouth/Throat: Mucous membranes are moist.  Oropharynx non-erythematous. Neck: No stridor. Painless ROM. No cervical spine tenderness to palpation Hematological/Lymphatic/Immunilogical: No cervical lymphadenopathy. Cardiovascular: Tachycardic, regular rhythm. Grossly normal heart sounds.  Good peripheral circulation. Respiratory: Tachypnea.  No retractions. Diminished breath sounds bilaterally. Gastrointestinal: Soft and nontender. No distention. No abdominal bruits. No CVA tenderness. Musculoskeletal: No lower extremity tenderness nor edema.  No joint effusions. Neurologic:  Acutely encephalopathic.  Left upper extremity with spasticity.  No facial droop. Extraocular motions intact. Currently protecting her airway Skin:  Skin is warm, dry and intact. No rash noted.  ____________________________________________   LABS (all labs ordered are listed, but only abnormal results are displayed)  Results for orders placed or performed during the hospital encounter of 12/30/15 (from the past 24 hour(s))  Comprehensive metabolic panel  Status: Abnormal   Collection Time: 12/30/15 11:42 AM  Result Value Ref Range   Sodium 131 (L) 135 - 145 mmol/L   Potassium 2.7 (LL)  3.5 - 5.1 mmol/L   Chloride 91 (L) 101 - 111 mmol/L   CO2 25 22 - 32 mmol/L   Glucose, Bld 235 (H) 65 - 99 mg/dL   BUN 11 6 - 20 mg/dL   Creatinine, Ser 0.98 0.44 - 1.00 mg/dL   Calcium 8.8 (L) 8.9 - 10.3 mg/dL   Total Protein 7.8 6.5 - 8.1 g/dL   Albumin 3.2 (L) 3.5 - 5.0 g/dL   AST 24 15 - 41 U/L   ALT 18 14 - 54 U/L   Alkaline Phosphatase 67 38 - 126 U/L   Total Bilirubin 1.1 0.3 - 1.2 mg/dL   GFR calc non Af Amer 55 (L) >60 mL/min   GFR calc Af Amer >60 >60 mL/min   Anion gap 15 5 - 15  CBC WITH DIFFERENTIAL     Status: Abnormal   Collection Time: 12/30/15 11:42 AM  Result Value Ref Range   WBC 20.4 (H) 3.6 - 11.0 K/uL   RBC 3.87 3.80 - 5.20 MIL/uL   Hemoglobin 11.4 (L) 12.0 - 16.0 g/dL   HCT 33.2 (L) 35.0 - 47.0 %   MCV 85.8 80.0 - 100.0 fL   MCH 29.6 26.0 - 34.0 pg   MCHC 34.5 32.0 - 36.0 g/dL   RDW 15.3 (H) 11.5 - 14.5 %   Platelets 365 150 - 440 K/uL   Neutrophils Relative % 95 %   Neutro Abs 19.5 (H) 1.4 - 6.5 K/uL   Lymphocytes Relative 2 %   Lymphs Abs 0.4 (L) 1.0 - 3.6 K/uL   Monocytes Relative 3 %   Monocytes Absolute 0.5 0.2 - 0.9 K/uL   Eosinophils Relative 0 %   Eosinophils Absolute 0.0 0 - 0.7 K/uL   Basophils Relative 0 %   Basophils Absolute 0.1 0 - 0.1 K/uL  Lactic acid, plasma     Status: None   Collection Time: 12/30/15 11:42 AM  Result Value Ref Range   Lactic Acid, Venous 1.5 0.5 - 1.9 mmol/L  Troponin I     Status: Abnormal   Collection Time: 12/30/15 11:42 AM  Result Value Ref Range   Troponin I 0.10 (HH) <0.03 ng/mL  Blood gas, venous     Status: Abnormal   Collection Time: 12/30/15 11:55 AM  Result Value Ref Range   pH, Ven 7.53 (H) 7.250 - 7.430   pCO2, Ven 34 (L) 44.0 - 60.0 mmHg   pO2, Ven 36.0 32.0 - 45.0 mmHg   Patient temperature 37.0    Collection site VENOUS    Sample type VENOUS   Urinalysis complete, with microscopic ([facility] only)     Status: Abnormal   Collection Time: 12/30/15  1:00 PM  Result Value Ref Range   Color,  Urine STRAW (A) YELLOW   APPearance CLEAR (A) CLEAR   Glucose, UA >500 (A) NEGATIVE mg/dL   Bilirubin Urine NEGATIVE NEGATIVE   Ketones, ur 1+ (A) NEGATIVE mg/dL   Specific Gravity, Urine 1.005 1.005 - 1.030   Hgb urine dipstick 2+ (A) NEGATIVE   pH 7.0 5.0 - 8.0   Protein, ur 30 (A) NEGATIVE mg/dL   Nitrite NEGATIVE NEGATIVE   Leukocytes, UA NEGATIVE NEGATIVE   RBC / HPF 6-30 0 - 5 RBC/hpf   WBC, UA 0-5 0 - 5 WBC/hpf   Bacteria, UA NONE  SEEN NONE SEEN   Squamous Epithelial / LPF 0-5 (A) NONE SEEN   ____________________________________________  EKG My review and personal interpretation at Time: 1132 indication and sepsis rate 1:30 normal axis sinus tachycardia ____________________________________________  RADIOLOGY  CXR my read shows no evidence of acute cardiopulmonary process.  ____________________________________________   PROCEDURES  Procedure(s) performed: none    Critical Care performed: yes CRITICAL CARE Performed by: Merlyn Lot   Total critical care time: 55 minutes  Critical care time was exclusive of separately billable procedures and treating other patients.  Critical care was necessary to treat or prevent imminent or life-threatening deterioration.  Critical care was time spent personally by me on the following activities: development of treatment plan with patient and/or surrogate as well as nursing, discussions with consultants, evaluation of patient's response to treatment, examination of patient, obtaining history from patient or surrogate, ordering and performing treatments and interventions, ordering and review of laboratory studies, ordering and review of radiographic studies, pulse oximetry and re-evaluation of patient's condition.  ____________________________________________   INITIAL IMPRESSION / ASSESSMENT AND PLAN / ED COURSE  Pertinent labs & imaging results that were available during my care of the patient were reviewed by me and  considered in my medical decision making (see chart for details).  DDX: Dehydration, sepsis, pna, uti, hypoglycemia, cva, drug effect, withdrawal, encephalitis   Khori L Bristow is a 75 y.o. who presents to the ED with altered mental status, fever and tachypnea with acute respiratory failure with hypoxia and tachycardia. Presentation concerning for acute severe sepsis. Exam was consistent with meningitis. Based on her left extremity spasticity without previous history of CVA I have concern for neurovascular incident. Patient also with acute hypoxia and recent cough for pneumonia also considered. Patient also with diarrhea will consider gastroenteritis. Her abdominal exam is otherwise benign. We'll proceed with full septic workup.  Clinical Course  Comment By Time  Patient reassessed and slightly more alert. Now responding to her granddaughter. Moving all extremities. Remains tachycardic. Pulse ox shows normal saturations. Merlyn Lot, MD 09/05 1241  Troponin elevation noted likely secondary to demand ischemia. Patient also with profound dehydration and hypokalemia. No significant acidosis and lactate is normal. Merlyn Lot, MD 09/05 1242  HR improving with IV fluid resuscitation. Merlyn Lot, MD 09/05 1318  Mentation continues to improve. The patient still hypertensive therefore given another dose of IV Lopressor. S do suspect component of this is secondary to missed antihypertensive medications this morning due to nausea and vomiting. Repeat abdominal exam soft. Based on her acute hypoxia do feel that her presentation still most clinically consistent with acute pneumonia. Patient was started on broad spectrum antibiotics. Patient admitted to hospital for further evaluation and management. Merlyn Lot, MD 09/05 1431     ____________________________________________   FINAL CLINICAL IMPRESSION(S) / ED DIAGNOSES  Final diagnoses:  Severe sepsis with acute organ dysfunction  ([place])  Acute respiratory failure with hypoxia ([facility])  Non-intractable vomiting with nausea, vomiting of unspecified type      NEW MEDICATIONS STARTED DURING THIS VISIT:  New Prescriptions   No medications on file     Note:  This document was prepared using Dragon voice recognition software and may include unintentional dictation errors.    Merlyn Lot, MD 12/30/15 [phone]

## 2015-12-30 NOTE — ED Notes (Signed)
Admitting MD at bedside.

## 2015-12-30 NOTE — Plan of Care (Signed)
Problem: Nutrition: Goal: Adequate nutrition will be maintained Outcome: Not Met (add Reason) Pt is NPO

## 2015-12-30 NOTE — Progress Notes (Signed)
Notified on-call provider, Dr. Tressia Miners of critical troponin .38. No new orders at this time.

## 2015-12-30 NOTE — ED Triage Notes (Signed)
Pt to ED via EMS from home c/o AMS today.  Per EMS patient with decreased LOC and found by family today, responsive to painful stimuli.  EMS vitals 195/100, 207 CBG, CO2 34.  Family states patient was found this morning around 1000 had defecated on self and was spitting up white phlegm.  Pt presents responsive to voice, not speaking, arms in tensed posture, gurgling respiratory sounds.

## 2015-12-30 NOTE — Progress Notes (Addendum)
Patient received from ER and patient nonverbal at this time, Vital signs obtained Telemetry placed on patient. Foley patent draining clear yellow urine. Patient has 2 IV's to both antecubitals and unable to keep arms straight at this time Third IV started to left hand 22 gauge angio. NS at 50 cc per hour from fluids from ER. Awaiting pharmacy to verify medication. Troponin drawn blood given to lab

## 2015-12-30 NOTE — ED Notes (Signed)
Patch placed on left arm for blood pressure at ordered by md.  Family with pt

## 2015-12-31 LAB — COMPREHENSIVE METABOLIC PANEL
ALBUMIN: 2.5 g/dL — AB (ref 3.5–5.0)
ALT: 14 U/L (ref 14–54)
ANION GAP: 11 (ref 5–15)
AST: 17 U/L (ref 15–41)
Alkaline Phosphatase: 54 U/L (ref 38–126)
BILIRUBIN TOTAL: 1 mg/dL (ref 0.3–1.2)
BUN: 13 mg/dL (ref 6–20)
CO2: 26 mmol/L (ref 22–32)
Calcium: 7.5 mg/dL — ABNORMAL LOW (ref 8.9–10.3)
Chloride: 94 mmol/L — ABNORMAL LOW (ref 101–111)
Creatinine, Ser: 1.04 mg/dL — ABNORMAL HIGH (ref 0.44–1.00)
GFR calc Af Amer: 59 mL/min — ABNORMAL LOW (ref >60)
GFR calc non Af Amer: 51 mL/min — ABNORMAL LOW (ref >60)
GLUCOSE: 182 mg/dL — AB (ref 65–99)
POTASSIUM: 2.7 mmol/L — AB (ref 3.5–5.1)
Sodium: 131 mmol/L — ABNORMAL LOW (ref 135–145)
TOTAL PROTEIN: 6.8 g/dL (ref 6.5–8.1)

## 2015-12-31 LAB — CBC
HEMATOCRIT: 29.8 % — AB (ref 35.0–47.0)
Hemoglobin: 10 g/dL — ABNORMAL LOW (ref 12.0–16.0)
MCH: 29 pg (ref 26.0–34.0)
MCHC: 33.5 g/dL (ref 32.0–36.0)
MCV: 86.7 fL (ref 80.0–100.0)
PLATELETS: 306 10*3/uL (ref 150–440)
RBC: 3.44 MIL/uL — ABNORMAL LOW (ref 3.80–5.20)
RDW: 15.2 % — AB (ref 11.5–14.5)
WBC: 19.8 10*3/uL — ABNORMAL HIGH (ref 3.6–11.0)

## 2015-12-31 LAB — POTASSIUM: Potassium: 2.8 mmol/L — ABNORMAL LOW (ref 3.5–5.1)

## 2015-12-31 LAB — TROPONIN I
TROPONIN I: 0.42 ng/mL — AB (ref <0.03)
Troponin I: 0.38 ng/mL (ref <0.03)

## 2015-12-31 LAB — URINE CULTURE: CULTURE: NO GROWTH

## 2015-12-31 LAB — GLUCOSE, CAPILLARY
GLUCOSE-CAPILLARY: 133 mg/dL — AB (ref 65–99)
Glucose-Capillary: 159 mg/dL — ABNORMAL HIGH (ref 65–99)
Glucose-Capillary: 140 mg/dL — ABNORMAL HIGH (ref 65–99)
Glucose-Capillary: 126 mg/dL — ABNORMAL HIGH (ref 65–99)

## 2015-12-31 MED ORDER — POTASSIUM CHLORIDE CRYS ER 20 MEQ PO TBCR
40.0000 meq | EXTENDED_RELEASE_TABLET | Freq: Once | ORAL | Status: AC
  Administered 2015-12-31: 40 meq via ORAL
  Filled 2015-12-31: qty 2

## 2015-12-31 MED ORDER — CLONIDINE HCL 0.1 MG PO TABS: 0.1000 mg | ORAL_TABLET | Freq: Two times a day (BID) | ORAL | Status: DC

## 2015-12-31 MED ORDER — ASPIRIN EC 81 MG PO TBEC
81.0000 mg | DELAYED_RELEASE_TABLET | Freq: Every day | ORAL | Status: DC
  Administered 2015-12-31 – 2016-01-01 (×2): 81 mg via ORAL
  Filled 2015-12-31 (×2): qty 1

## 2015-12-31 MED ORDER — LISINOPRIL 20 MG PO TABS
40.0000 mg | ORAL_TABLET | Freq: Every day | ORAL | Status: DC
  Administered 2015-12-31 – 2016-01-01 (×2): 40 mg via ORAL
  Filled 2015-12-31 (×2): qty 2

## 2015-12-31 MED ORDER — SODIUM CHLORIDE 0.9 % IV SOLN
INTRAVENOUS | Status: DC
  Administered 2015-12-31: 60 mL via INTRAVENOUS

## 2015-12-31 MED ORDER — LORATADINE 10 MG PO TABS
10.0000 mg | ORAL_TABLET | Freq: Every day | ORAL | Status: DC
  Administered 2015-12-31 – 2016-01-01 (×2): 10 mg via ORAL
  Filled 2015-12-31 (×2): qty 1

## 2015-12-31 MED ORDER — METOPROLOL TARTRATE 25 MG PO TABS
25.0000 mg | ORAL_TABLET | Freq: Two times a day (BID) | ORAL | Status: DC
  Administered 2015-12-31 – 2016-01-01 (×3): 25 mg via ORAL
  Filled 2015-12-31 (×3): qty 1

## 2015-12-31 MED ORDER — LEVOFLOXACIN IN D5W 750 MG/150ML IV SOLN
750.0000 mg | INTRAVENOUS | Status: DC
  Administered 2015-12-31: 750 mg via INTRAVENOUS
  Filled 2015-12-31: qty 150

## 2015-12-31 MED ORDER — HYDROCHLOROTHIAZIDE 25 MG PO TABS
25.0000 mg | ORAL_TABLET | Freq: Every day | ORAL | Status: DC
  Administered 2015-12-31 – 2016-01-01 (×2): 25 mg via ORAL
  Filled 2015-12-31 (×2): qty 1

## 2015-12-31 MED ORDER — LISINOPRIL-HYDROCHLOROTHIAZIDE 20-12.5 MG PO TABS: 2.0000 | ORAL_TABLET | Freq: Every day | ORAL | Status: DC

## 2015-12-31 MED ORDER — ENOXAPARIN SODIUM 30 MG/0.3ML ~~LOC~~ SOLN
25.0000 mg | Freq: Once | SUBCUTANEOUS | Status: AC
  Administered 2015-12-31: 25 mg via SUBCUTANEOUS
  Filled 2015-12-31: qty 0.3

## 2015-12-31 MED ORDER — ENOXAPARIN SODIUM 80 MG/0.8ML ~~LOC~~ SOLN
1.0000 mg/kg | Freq: Two times a day (BID) | SUBCUTANEOUS | Status: DC
  Filled 2015-12-31: qty 0.8

## 2015-12-31 MED ORDER — ASPIRIN EC 325 MG PO TBEC
325.0000 mg | DELAYED_RELEASE_TABLET | Freq: Once | ORAL | Status: AC
  Administered 2015-12-31: 325 mg via ORAL
  Filled 2015-12-31: qty 1

## 2015-12-31 MED ORDER — SODIUM CHLORIDE 0.9 % IV SOLN
INTRAVENOUS | Status: DC
  Administered 2015-12-31 – 2016-01-01 (×3): via INTRAVENOUS

## 2015-12-31 MED ORDER — ENOXAPARIN SODIUM 40 MG/0.4ML ~~LOC~~ SOLN
40.0000 mg | SUBCUTANEOUS | Status: DC
  Administered 2015-12-31: 40 mg via SUBCUTANEOUS
  Filled 2015-12-31: qty 0.4

## 2015-12-31 MED ORDER — POTASSIUM CHLORIDE 10 MEQ/100ML IV SOLN
10.0000 meq | INTRAVENOUS | Status: AC
  Administered 2015-12-31 – 2016-01-01 (×4): 10 meq via INTRAVENOUS
  Filled 2015-12-31 (×4): qty 100

## 2015-12-31 MED ORDER — CARBAMIDE PEROXIDE 6.5 % OT SOLN
5.0000 [drp] | Freq: Two times a day (BID) | OTIC | Status: DC
  Administered 2015-12-31 – 2016-01-01 (×3): 5 [drp] via OTIC
  Filled 2015-12-31: qty 15

## 2015-12-31 MED ORDER — INSULIN ASPART 100 UNIT/ML ~~LOC~~ SOLN
0.0000 [IU] | Freq: Three times a day (TID) | SUBCUTANEOUS | Status: DC
Start: 2015-12-31 — End: 2016-01-01
  Administered 2015-12-31 (×2): 2 [IU] via SUBCUTANEOUS
  Administered 2015-12-31: 3 [IU] via SUBCUTANEOUS
  Administered 2016-01-01: 2 [IU] via SUBCUTANEOUS
  Filled 2015-12-31 (×2): qty 2
  Filled 2015-12-31: qty 3
  Filled 2015-12-31: qty 2

## 2015-12-31 MED ORDER — ATORVASTATIN CALCIUM 20 MG PO TABS
80.0000 mg | ORAL_TABLET | Freq: Once | ORAL | Status: AC
  Administered 2015-12-31: 80 mg via ORAL
  Filled 2015-12-31: qty 4

## 2015-12-31 MED ORDER — IBUPROFEN 600 MG PO TABS: 600.0000 mg | ORAL_TABLET | Freq: Four times a day (QID) | ORAL | Status: DC | PRN

## 2015-12-31 MED ORDER — GABAPENTIN 300 MG PO CAPS
300.0000 mg | ORAL_CAPSULE | Freq: Three times a day (TID) | ORAL | Status: DC
  Administered 2015-12-31 – 2016-01-01 (×5): 300 mg via ORAL
  Filled 2015-12-31 (×5): qty 1

## 2015-12-31 MED ORDER — POTASSIUM CHLORIDE 10 MEQ/100ML IV SOLN
10.0000 meq | INTRAVENOUS | Status: AC
  Administered 2015-12-31 (×3): 10 meq via INTRAVENOUS
  Filled 2015-12-31 (×3): qty 100

## 2015-12-31 MED ORDER — MAGNESIUM SULFATE 4 GM/100ML IV SOLN
4.0000 g | Freq: Once | INTRAVENOUS | Status: AC
  Administered 2015-12-31: 4 g via INTRAVENOUS
  Filled 2015-12-31: qty 100

## 2015-12-31 MED ORDER — LEVOFLOXACIN IN D5W 500 MG/100ML IV SOLN
500.0000 mg | INTRAVENOUS | Status: DC
  Filled 2015-12-31: qty 100

## 2015-12-31 MED ORDER — CLOPIDOGREL BISULFATE 75 MG PO TABS
300.0000 mg | ORAL_TABLET | Freq: Once | ORAL | Status: AC
  Administered 2015-12-31: 300 mg via ORAL
  Filled 2015-12-31: qty 4

## 2015-12-31 MED ORDER — ACETAMINOPHEN 650 MG RE SUPP
650.0000 mg | Freq: Once | RECTAL | Status: AC
  Administered 2015-12-31: 650 mg via RECTAL
  Filled 2015-12-31: qty 1

## 2015-12-31 NOTE — Care Management (Signed)
Patient admitting from home with Metabolic encephalopathy secondary to Sepsis.  Patient is from home.  Receiving IV antibiotics.  Receives her primary care at [facility].  Per nursing patient's mental status has improved and is now A&O.  Per nursing patient has been able to ambulate with standby assist.  No needs anticipated.  RNCM following for discharge needs.

## 2015-12-31 NOTE — Progress Notes (Signed)
Dr. Estanislado Pandy notified of critical potassium of 2.7. Pt ordered to have 40 mEq orally and 10 mEq IV x3 hours.

## 2015-12-31 NOTE — Progress Notes (Signed)
  Per Dr. Posey Pronto okay to discontinue foley, discontinue fluids, discontinue vancy, and place order for PT.

## 2015-12-31 NOTE — Progress Notes (Signed)
Patient ID: Regina Perkins, female   DOB: [birth year], 75 y.o.   MRN: [id number] [place] [facility] [place] NAME: Regina Perkins    MR#:  [id number]  DATE OF BIRTH:  19-Nov-1940  SUBJECTIVE:   Came in with AMS ad now improving. Had fever of 103 y'day Afebrile today More talkative. C/o sinus HA and post nasal drip for 1 week.  REVIEW OF SYSTEMS:   Review of Systems  Constitutional: Positive for malaise/fatigue. Negative for chills, fever and weight loss.  HENT: Positive for congestion and hearing loss. Negative for ear discharge, ear pain and nosebleeds.   Eyes: Negative for blurred vision, pain and discharge.  Respiratory: Negative for sputum production, shortness of breath, wheezing and stridor.   Cardiovascular: Negative for chest pain, palpitations, orthopnea and PND.  Gastrointestinal: Negative for abdominal pain, diarrhea, nausea and vomiting.  Genitourinary: Negative for frequency and urgency.  Musculoskeletal: Negative for back pain and joint pain.  Neurological: Positive for weakness and headaches. Negative for sensory change, speech change and focal weakness.  Psychiatric/Behavioral: Negative for depression and hallucinations. The patient is not nervous/anxious.    Tolerating Diet:yes Tolerating PT: pending  DRUG ALLERGIES:   Allergies  Allergen Reactions  . Ivp Dye [Iodinated Diagnostic Agents]   . Shellfish Allergy Other (See Comments)    Reaction: unknown     VITALS:  Blood pressure (!) 180/81, pulse 92, temperature 98.9 F (37.2 C), temperature source Oral, resp. rate 16, height '5\' 6"'$  (1.676 m), weight 63.5 kg (140 lb), SpO2 100 %.  PHYSICAL EXAMINATION:   Physical Exam  GENERAL:  75 y.o.-year-old patient lying in the bed with no acute distress.  EYES: Pupils equal, round, reactive to light and accommodation. No scleral icterus. Extraocular muscles intact.  HEENT: Head atraumatic, normocephalic. Oropharynx and  nasopharynx clear.  Right ear impacted cerumen. NECK:  Supple, no jugular venous distention. No thyroid enlargement, no tenderness.  LUNGS: Normal breath sounds bilaterally, no wheezing, rales, rhonchi. No use of accessory muscles of respiration.  CARDIOVASCULAR: S1, S2 normal. No murmurs, rubs, or gallops.  ABDOMEN: Soft, nontender, nondistended. Bowel sounds present. No organomegaly or mass.  EXTREMITIES: No cyanosis, clubbing or edema b/l.    NEUROLOGIC: Cranial nerves II through XII are intact. No focal Motor or sensory deficits b/l.   PSYCHIATRIC:  patient is alert and oriented x 3.  SKIN: No obvious rash, lesion, or ulcer.   LABORATORY PANEL:  CBC  Recent Labs Lab 12/31/15 0423  WBC 19.8*  HGB 10.0*  HCT 29.8*  PLT 306    Chemistries   Recent Labs Lab 12/30/15 1142  12/31/15 0423  NA 131*  --  131*  K 2.7*  < > 2.7*  CL 91*  --  94*  CO2 25  --  26  GLUCOSE 235*  --  182*  BUN 11  --  13  CREATININE 0.98  --  1.04*  CALCIUM 8.8*  --  7.5*  MG 1.4*  --   --   AST 24  --  17  ALT 18  --  14  ALKPHOS 67  --  54  BILITOT 1.1  --  1.0  < > = values in this interval not displayed. Cardiac Enzymes  Recent Labs Lab 12/31/15 0423  TROPONINI 0.42*   RADIOLOGY:  Ct Abdomen Pelvis Wo Contrast  Result Date: 12/30/2015 CLINICAL DATA:  Altered mental status EXAM: CT ABDOMEN AND PELVIS WITHOUT CONTRAST TECHNIQUE: Multidetector CT imaging  of the abdomen and pelvis was performed following the standard protocol without IV contrast. COMPARISON:  CT left hip 01/16/2012 FINDINGS: Small pleural effusions and dependent atelectasis. Tiny pericardial effusion. Abdominal exam is limited by streak artifact from the upper extremities There are low-density lesions in the left lobe of the liver which are nonspecific. They cannot be characterize secondary to streak artifact. They are both in the lateral segment of the left lobe. The largest measures 1.6 cm. No evidence of liver injury  Gallbladder, spleen, pancreas, kidneys are within normal limits. Adrenal glands are prominent in a hyperplasia pattern No evidence of bowel obstruction. No obvious focal mass in the colon. Appendix is nonvisualized. Uterus is absent. Adnexa are within normal limits. Bladder is distended. Small para-aortic lymph nodes. No hemoperitoneum. No evidence of retroperitoneal hemorrhage. There is a small amount of layering free-fluid in the pelvis Lower lumbar fusion hardware is in place. There is no vertebral compression deformity. IMPRESSION: Small pleural effusions.  Tiny pericardial effusion. Nonspecific hypodensities in the liver cannot be characterize completely on this exam. Routine CT abdomen pelvis with contrast can be performed to further delineate. No evidence of organ injury. Bladder is distended. Aortic atherosclerosis. Electronically Signed   By: Marybelle Killings M.D.   On: 12/30/2015 15:26   Ct Head Wo Contrast  Result Date: 12/30/2015 CLINICAL DATA:  Altered mental status. Unresponsive. Diabetes. Hypertension. EXAM: CT HEAD WITHOUT CONTRAST TECHNIQUE: Contiguous axial images were obtained from the base of the skull through the vertex without intravenous contrast. COMPARISON:  None. FINDINGS: Brain: Moderate low density in the periventricular white matter likely related to small vessel disease. Dilated perivascular space versus remote lacunar infarct in the left caudate. No mass lesion, hemorrhage, hydrocephalus, acute infarct, intra-axial, or extra-axial fluid collection. Vascular: No hyperdense vessel or unexpected calcification. Skull: No significant soft tissue swelling.  No skull fracture. Sinuses/Orbits: Normal orbits and globes. Complete opacification of the right ethmoid air cells and middle ear. Other paranasal sinuses and mastoid air cells are clear. Probable cerumen in the right external ear. Other: None IMPRESSION: 1.  No acute intracranial abnormality. 2. Fluid the right mastoid air cells and  middle ear is likely chronic and incompletely evaluated on this nondedicated study. 3. Moderate small vessel ischemic change. Electronically Signed   By: Abigail Miyamoto M.D.   On: 12/30/2015 15:26   Dg Chest Port 1 View  Result Date: 12/30/2015 CLINICAL DATA:  Altered mental status.  Hypertension. EXAM: PORTABLE CHEST 1 VIEW COMPARISON:  February 26, 2014 FINDINGS: There is no edema or consolidation. Heart is mildly enlarged with pulmonary vascular within normal limits. No adenopathy. No bone lesions. IMPRESSION: Mild cardiac enlargement.  No edema or consolidation. Electronically Signed   By: Lowella Grip III M.D.   On: 12/30/2015 12:30   ASSESSMENT AND PLAN:  Regina Perkins  is a 75 y.o. female with a known history of Hypertension and diabetes mellitus is brought into the emergency department by EMS for altered mental status. Patient was found to be lethargic and less responsive by the family members. Initial vital signs by EMS for blood pressure at 195/100 with CBG at 207. 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 Trop trending down  #Diabetes mellitus Cont SSI Hold metformin till pt's oral intake improves  #Hypokalemia-replete 2.7 Replete potassium and check magnesium and potassium  D/w pt and extended family PT to see pt  Case discussed with Care Management/Social Worker. Management plans  discussed with the patient, family and they are in agreement.  CODE STATUS Full DVT Prophylaxis: lovenox TOTAL TIME TAKING CARE OF THIS PATIENT: 30* minutes.  >50% time spent on counselling and coordination of care  POSSIBLE D/C IN 1-2DAYS, DEPENDING ON CLINICAL CONDITION.  Note: This dictation was prepared with Dragon dictation along with smaller phrase technology. Any transcriptional errors that result from this process are unintentional.  Rodolphe Edmonston M.D on 12/31/2015 at 12:24 PM  Between 7am to 6pm - Pager - [phone]  After 6pm go to [email or web address] - password [email or web address] [facility]  [place] [facility]  Office  [phone]  CC: Primary care physician; [place]

## 2015-12-31 NOTE — Progress Notes (Signed)
Notified Dr. Posey Pronto that patient has a temp of 103.1. Per MD okay to give  PRN tylenol and start back on maintenance fluids at 23m/hr.

## 2015-12-31 NOTE — Progress Notes (Signed)
Per Dr. Vianne Bulls okay to place patient on a 2 gram sodium diet. Also okay to place an order for normal saline at 60 ml per hours.

## 2015-12-31 NOTE — Progress Notes (Addendum)
Notified Dr. Posey Pronto that patient still has a temp of 103. Per MD okay to place order for '650mg'$  tylenol suppository once. Also place order for PRN ibuprofen PO '600mg'$  q 6 hours prn for fever of 101 or greater.

## 2015-12-31 NOTE — Progress Notes (Signed)
On-call provider contacted, Dr. Ara Kussmaul, for critical troponin level .53. No new orders.

## 2015-12-31 NOTE — Progress Notes (Signed)
ELECTROLYTE CONSULT NOTE - INITIAL  Pharmacy Consult for electrolyte monitoring and replacement   Allergies  Allergen Reactions  . Ivp Dye [Iodinated Diagnostic Agents]   . Shellfish Allergy Other (See Comments)    Reaction: unknown     Patient Measurements: Height: '5\' 6"'$  (167.6 cm) Weight: 140 lb (63.5 kg) IBW/kg (Calculated) : 59.3  Vital Signs: Temp: 98.9 F (37.2 C) (09/06 0523) Temp Source: Oral (09/06 0523) BP: 160/67 (09/06 0523) Pulse Rate: 92 (09/06 0523) Intake/Output from previous day: 09/05 0701 - 09/06 0700 In: 641.2 [I.V.:611.1; IV Piggyback:30.2] Out: 1000 [Urine:1000] Intake/Output from this shift: Total I/O In: 22 [IV Piggyback:22] Out: 300 [Urine:300]  Labs:  Recent Labs  12/30/15 1142 12/31/15 0423  WBC 20.4* 19.8*  HGB 11.4* 10.0*  HCT 33.2* 29.8*  PLT 365 306     Recent Labs  12/30/15 1142 12/30/15 2235 12/31/15 0423  NA 131*  --  131*  K 2.7* 2.9* 2.7*  CL 91*  --  94*  CO2 25  --  26  GLUCOSE 235*  --  182*  BUN 11  --  13  CREATININE 0.98  --  1.04*  CALCIUM 8.8*  --  7.5*  MG 1.4*  --   --   PROT 7.8  --  6.8  ALBUMIN 3.2*  --  2.5*  AST 24  --  17  ALT 18  --  14  ALKPHOS 67  --  54  BILITOT 1.1  --  1.0   Estimated Creatinine Clearance: 43.8 mL/min (by C-G formula based on SCr of 1.04 mg/dL).    Recent Labs  12/31/15 0839  GLUCAP 159*    Medical History: Past Medical History:  Diagnosis Date  . Diabetes mellitus without complication ([place])   . Hypertension    Assessment: 75 yo admitted with chest pain/NSTEMI and sepsis.  Pharmacy consulted for electrolyte monitoring and replacement.   K: 2.7 Mg: 1.4  Plan:  Patient received KCL 40 mEq PO and is receiving KCL 10 mEq IV X3.  Will replacement with Magnesium 4g IV x1. Will recheck Potassium this evening, and all electrolyte with AM labs.   Nancy Fetter, PharmD Clinical Pharmacist 12/31/2015 10:07 AM

## 2015-12-31 NOTE — Progress Notes (Signed)
ANTICOAGULATION CONSULT NOTE - Initial Consult  Pharmacy Consult for Lovenox dosing Indication: chest pain/ACS/NSTEMI  Allergies  Allergen Reactions  . Ivp Dye [Iodinated Diagnostic Agents]   . Shellfish Allergy Other (See Comments)    Reaction: unknown     Patient Measurements: Height: '5\' 6"'$  (167.6 cm) Weight: 140 lb (63.5 kg) IBW/kg (Calculated) : 59.3 Heparin Dosing Weight: 63.5 kg  Vital Signs: Temp: 99.3 F (37.4 C) (09/05 1952) Temp Source: Oral (09/05 1952) BP: 175/91 (09/05 1951) Pulse Rate: 105 (09/05 1951)  Labs:  Recent Labs  12/30/15 1142 12/30/15 1840 12/30/15 2235  HGB 11.4*  --   --   HCT 33.2*  --   --   PLT 365  --   --   CREATININE 0.98  --   --   TROPONINI 0.10* 0.38* 0.53*    Estimated Creatinine Clearance: 46.4 mL/min (by C-G formula based on SCr of 0.98 mg/dL).   Medical History: Past Medical History:  Diagnosis Date  . Diabetes mellitus without complication ([place])   . Hypertension     Medications:    Assessment:  Goal of Therapy:  Monitor platelets by anticoagulation protocol: Yes   Plan:  Lovenox 1 mg/kg q 12 hours ordered. F/u labs per protocol.  Regina Perkins S 12/31/2015,1:01 AM

## 2015-12-31 NOTE — Progress Notes (Signed)
Per Dr. Posey Pronto remove both clonidine and nitro patch

## 2015-12-31 NOTE — Progress Notes (Signed)
Patient ID: Regina Perkins, female   DOB: Mar 22, 1941, 75 y.o.   MRN: [id number] Rec'd call for trending trop 0.1 -->0.38 --> 0.53  Will start aspirin, Plavix, Lovenox and statin.  Patient already receiving beta blocker.

## 2015-12-31 NOTE — Progress Notes (Signed)
ELECTROLYTE CONSULT NOTE - INITIAL  Pharmacy Consult for electrolyte monitoring and replacement   Allergies  Allergen Reactions  . Ivp Dye [Iodinated Diagnostic Agents]   . Shellfish Allergy Other (See Comments)    Reaction: unknown     Patient Measurements: Height: '5\' 6"'$  (167.6 cm) Weight: 140 lb (63.5 kg) IBW/kg (Calculated) : 59.3  Vital Signs: Temp: 100.2 F (37.9 C) (09/06 1757) Temp Source: Oral (09/06 1612) BP: 167/93 (09/06 1437) Pulse Rate: 88 (09/06 1437) Intake/Output from previous day: 09/05 0701 - 09/06 0700 In: 641.2 [I.V.:611.1; IV Piggyback:30.2] Out: 1000 [Urine:1000] Intake/Output from this shift: Total I/O In: 446 [I.V.:374; IV Piggyback:72] Out: 550 [Urine:550]  Labs:  Recent Labs  12/30/15 1142 12/31/15 0423  WBC 20.4* 19.8*  HGB 11.4* 10.0*  HCT 33.2* 29.8*  PLT 365 306     Recent Labs  12/30/15 1142 12/30/15 2235 12/31/15 0423 12/31/15 1805  NA 131*  --  131*  --   K 2.7* 2.9* 2.7* 2.8*  CL 91*  --  94*  --   CO2 25  --  26  --   GLUCOSE 235*  --  182*  --   BUN 11  --  13  --   CREATININE 0.98  --  1.04*  --   CALCIUM 8.8*  --  7.5*  --   MG 1.4*  --   --   --   PROT 7.8  --  6.8  --   ALBUMIN 3.2*  --  2.5*  --   AST 24  --  17  --   ALT 18  --  14  --   ALKPHOS 67  --  54  --   BILITOT 1.1  --  1.0  --    Estimated Creatinine Clearance: 43.8 mL/min (by C-G formula based on SCr of 1.04 mg/dL).    Recent Labs  12/31/15 0839 12/31/15 1207 12/31/15 1742  GLUCAP 159* 140* 126*    Medical History: Past Medical History:  Diagnosis Date  . Diabetes mellitus without complication ([place])   . Hypertension    Assessment: 75 yo admitted with chest pain/NSTEMI and sepsis.  Pharmacy consulted for electrolyte monitoring and replacement.   K: 2.8 @ 1600 this evening despite replacement  Plan:  Will give another 4 runs of IV potassium (40 mEq total) Will recheck electrolytes with AM labs.   Theodis Shove,  PharmD Clinical Pharmacist 12/31/2015 6:59 PM

## 2016-01-01 LAB — CBC
HCT: 28 % — ABNORMAL LOW (ref 35.0–47.0)
Hemoglobin: 9.7 g/dL — ABNORMAL LOW (ref 12.0–16.0)
MCH: 30.1 pg (ref 26.0–34.0)
MCHC: 34.7 g/dL (ref 32.0–36.0)
MCV: 86.7 fL (ref 80.0–100.0)
PLATELETS: 279 10*3/uL (ref 150–440)
RBC: 3.22 MIL/uL — ABNORMAL LOW (ref 3.80–5.20)
RDW: 15.3 % — AB (ref 11.5–14.5)
WBC: 14.8 10*3/uL — AB (ref 3.6–11.0)

## 2016-01-01 LAB — BASIC METABOLIC PANEL
Anion gap: 9 (ref 5–15)
BUN: 15 mg/dL (ref 6–20)
CHLORIDE: 100 mmol/L — AB (ref 101–111)
CO2: 26 mmol/L (ref 22–32)
CREATININE: 1.26 mg/dL — AB (ref 0.44–1.00)
Calcium: 7.7 mg/dL — ABNORMAL LOW (ref 8.9–10.3)
GFR calc non Af Amer: 41 mL/min — ABNORMAL LOW (ref >60)
GFR, EST AFRICAN AMERICAN: 47 mL/min — AB (ref >60)
Glucose, Bld: 98 mg/dL (ref 65–99)
Potassium: 3.5 mmol/L (ref 3.5–5.1)
Sodium: 135 mmol/L (ref 135–145)

## 2016-01-01 LAB — GLUCOSE, CAPILLARY
GLUCOSE-CAPILLARY: 137 mg/dL — AB (ref 65–99)
Glucose-Capillary: 116 mg/dL — ABNORMAL HIGH (ref 65–99)

## 2016-01-01 LAB — MAGNESIUM: Magnesium: 2.5 mg/dL — ABNORMAL HIGH (ref 1.7–2.4)

## 2016-01-01 MED ORDER — LEVOFLOXACIN 500 MG PO TABS
750.0000 mg | ORAL_TABLET | ORAL | Status: DC
  Administered 2016-01-01: 750 mg via ORAL
  Filled 2016-01-01: qty 2

## 2016-01-01 MED ORDER — LEVOFLOXACIN 750 MG PO TABS: 750.0000 mg | ORAL_TABLET | ORAL | 0 refills | Status: DC

## 2016-01-01 MED ORDER — LORATADINE 10 MG PO TABS: 10.0000 mg | ORAL_TABLET | Freq: Every day | ORAL | 0 refills | Status: DC

## 2016-01-01 NOTE — Progress Notes (Signed)
ELECTROLYTE CONSULT NOTE - INITIAL  Pharmacy Consult for electrolyte monitoring and replacement   Allergies  Allergen Reactions  . Ivp Dye [Iodinated Diagnostic Agents]   . Shellfish Allergy Other (See Comments)    Reaction: unknown     Patient Measurements: Height: '5\' 6"'$  (167.6 cm) Weight: 140 lb (63.5 kg) IBW/kg (Calculated) : 59.3  Vital Signs: Temp: 98.5 F (36.9 C) (09/07 0454) Temp Source: Oral (09/07 0454) BP: 140/56 (09/07 0454) Pulse Rate: 72 (09/07 0454) Intake/Output from previous day: 09/06 0701 - 09/07 0700 In: 2056.7 [P.O.:120; I.V.:1478.9; IV Piggyback:457.8] Out: 550 [Urine:550] Intake/Output from this shift: Total I/O In: 351 [I.V.:351] Out: -   Labs:  Recent Labs  12/30/15 1142 12/31/15 0423  WBC 20.4* 19.8*  HGB 11.4* 10.0*  HCT 33.2* 29.8*  PLT 365 306     Recent Labs  12/30/15 1142  12/31/15 0423 12/31/15 1805 01/01/16 0457  NA 131*  --  131*  --  135  K 2.7*  < > 2.7* 2.8* 3.5  CL 91*  --  94*  --  100*  CO2 25  --  26  --  26  GLUCOSE 235*  --  182*  --  98  BUN 11  --  13  --  15  CREATININE 0.98  --  1.04*  --  1.26*  CALCIUM 8.8*  --  7.5*  --  7.7*  MG 1.4*  --   --   --  2.5*  PROT 7.8  --  6.8  --   --   ALBUMIN 3.2*  --  2.5*  --   --   AST 24  --  17  --   --   ALT 18  --  14  --   --   ALKPHOS 67  --  54  --   --   BILITOT 1.1  --  1.0  --   --   < > = values in this interval not displayed. Estimated Creatinine Clearance: 36.1 mL/min (by C-G formula based on SCr of 1.26 mg/dL).    Recent Labs  12/31/15 1742 12/31/15 2153 01/01/16 0741  GLUCAP 126* 133* 116*    Medical History: Past Medical History:  Diagnosis Date  . Diabetes mellitus without complication ([place])   . Hypertension    Assessment: 75 yo admitted with chest pain/NSTEMI and sepsis.  Pharmacy consulted for electrolyte monitoring and replacement.   K: 3.5  Mg: 2.5  Plan:  Electrolytes WNL this morning. Will recheck electrolytes with AM  labs.   Nancy Fetter, PharmD Clinical Pharmacist 01/01/2016 8:39 AM

## 2016-01-01 NOTE — Discharge Instructions (Signed)
Patient recommended to get ENT referral thru PCP

## 2016-01-01 NOTE — Progress Notes (Signed)
PT Cancellation Note  Patient Details Name: Regina Perkins MRN: [id number] DOB: [birth year]   Cancelled Treatment:    Reason Eval/Treat Not Completed: Other (comment). Consult received and chart reviewed. PT entered room and pt reports she has been ambulatory with RN staff and is independent. Pt has headache right now and wishes not to participate in PT at this time. Agreeable to strength testing with B UE/LE 5/5. Pt reports no falls, independent at baseline, and has no mobility concerns at this time. Pt reports she will continue to walk with family later today once headache is resolved.  Pt wishes to be discharged from PT services. No skilled needs identified. Will discontinue current orders at this time. Please re-order if needs change.   Eliezer Khawaja 01/01/2016, 9:21 AM Greggory Stallion, PT, DPT [phone]

## 2016-01-01 NOTE — Discharge Summary (Signed)
[facility] [place] NAME: Regina Perkins    MR#:  [id number]  DATE OF BIRTH:  1941/03/20  DATE OF ADMISSION:  12/30/2015 ADMITTING PHYSICIAN: Nicholes Mango, MD  DATE OF DISCHARGE: 01/01/16  PRIMARY CARE PHYSICIAN: [facility]    ADMISSION DIAGNOSIS:  Acute respiratory failure with hypoxia (HCC) [J96.01] Severe sepsis with acute organ dysfunction (HCC) [A41.9, R65.20] Non-intractable vomiting with nausea, vomiting of unspecified type [R11.2]  DISCHARGE DIAGNOSIS:  Sepsis-resolved Acute on Chronic sinusitis  SECONDARY DIAGNOSIS:   Past Medical History:  Diagnosis Date  . Diabetes mellitus without complication ([place])   . Hypertension     HOSPITAL COURSE:   [name] a 75 y.o. femalewith a known history of Hypertension and diabetes mellitus is brought into the emergency department by EMS for altered mental status. Patient was found to be lethargic and less responsive by the family members. Initial vital signs by EMS for blood pressure at 195/100 with CBG at 207. According to family members patient was spitting up phlegm and reporting left ear pain  #Metabolic encephalopathy secondary to Sepsis due to Acute on chronic ethmoid/mastoid sinusistis -Meets criteria with fever, tachycardia and leukocytosis -BC negatvie - chest x-ray negative change to IV levaquin---now on po levaquin for (10 days) -mentation back to baseline  -CT head shows chronic right ethmoid/mastoid sinusitis changes -family recommended PCP referral for ENT appt -wbc count down to 14 K  #Malignant hypertension with mild elevated troponin  -no cp Resume home meds Asa 81 mg daily  #Elevated troponin can be from demand ischemia from sepsis and malignant hypertension D/c rx dose lovenox. Trop trending down  #Diabetes mellitus Cont SSI Resume metformin  #Hypokalemia-replete2.7 Replete ted  D/w pt and extended family NO PT needs at  home CONSULTS OBTAINED:    DRUG ALLERGIES:   Allergies  Allergen Reactions  . Ivp Dye [Iodinated Diagnostic Agents]   . Shellfish Allergy Other (See Comments)    Reaction: unknown     DISCHARGE MEDICATIONS:   Current Discharge Medication List    START taking these medications   Details  levofloxacin (LEVAQUIN) 750 MG tablet Take 1 tablet (750 mg total) by mouth every other day. Qty: 5 tablet, Refills: 0    loratadine (CLARITIN) 10 MG tablet Take 1 tablet (10 mg total) by mouth daily. Qty: 30 tablet, Refills: 0      CONTINUE these medications which have NOT CHANGED   Details  gabapentin (NEURONTIN) 300 MG capsule Take 300 mg by mouth 3 (three) times daily.    lisinopril-hydrochlorothiazide (PRINZIDE,ZESTORETIC) 20-12.5 MG tablet Take 2 tablets by mouth daily.    metFORMIN (GLUCOPHAGE) 1000 MG tablet Take 1,000 mg by mouth 2 (two) times daily with a meal.    metoprolol tartrate (LOPRESSOR) 25 MG tablet Take 25 mg by mouth 2 (two) times daily.      STOP taking these medications     ibuprofen (ADVIL,MOTRIN) 800 MG tablet         If you experience worsening of your admission symptoms, develop shortness of breath, life threatening emergency, suicidal or homicidal thoughts you must seek medical attention immediately by calling 911 or calling your MD immediately  if symptoms less severe.  You Must read complete instructions/literature along with all the possible adverse reactions/side effects for all the Medicines you take and that have been prescribed to you. Take any new Medicines after you have completely understood and accept all the possible adverse reactions/side effects.  Please note  You were cared for by a hospitalist during your hospital stay. If you have any questions about your discharge medications or the care you received while you were in the hospital after you are discharged, you can call the unit and asked to speak with the hospitalist on call if the  hospitalist that took care of you is not available. Once you are discharged, your primary care physician will handle any further medical issues. Please note that NO REFILLS for any discharge medications will be authorized once you are discharged, as it is imperative that you return to your primary care physician (or establish a relationship with a primary care physician if you do not have one) for your aftercare needs so that they can reassess your need for medications and monitor your lab values. Today   SUBJECTIVE   Doing well. Mild headache earlier  VITAL SIGNS:  Blood pressure (!) 164/64, pulse 83, temperature 97.8 F (36.6 C), temperature source Oral, resp. rate 18, height '5\' 6"'$  (1.676 m), weight 63.5 kg (140 lb), SpO2 100 %.  I/O:   Intake/Output Summary (Last 24 hours) at 01/01/16 1341 Last data filed at 01/01/16 1300  Gross per 24 hour  Intake          2166.69 ml  Output                1 ml  Net          2165.69 ml    PHYSICAL EXAMINATION:  GENERAL:  75 y.o.-year-old patient lying in the bed with no acute distress.  EYES: Pupils equal, round, reactive to light and accommodation. No scleral icterus. Extraocular muscles intact.  HEENT: Head atraumatic, normocephalic. Oropharynx and nasopharynx clear.  NECK:  Supple, no jugular venous distention. No thyroid enlargement, no tenderness.  LUNGS: Normal breath sounds bilaterally, no wheezing, rales,rhonchi or crepitation. No use of accessory muscles of respiration.  CARDIOVASCULAR: S1, S2 normal. No murmurs, rubs, or gallops.  ABDOMEN: Soft, non-tender, non-distended. Bowel sounds present. No organomegaly or mass.  EXTREMITIES: No pedal edema, cyanosis, or clubbing.  NEUROLOGIC: Cranial nerves II through XII are intact. Muscle strength 5/5 in all extremities. Sensation intact. Gait not checked.  PSYCHIATRIC: The patient is alert and oriented x 3.  SKIN: No obvious rash, lesion, or ulcer.   DATA REVIEW:   CBC   Recent Labs Lab  01/01/16 0457  WBC 14.8*  HGB 9.7*  HCT 28.0*  PLT 279    Chemistries   Recent Labs Lab 12/31/15 0423  01/01/16 0457  NA 131*  --  135  K 2.7*  < > 3.5  CL 94*  --  100*  CO2 26  --  26  GLUCOSE 182*  --  98  BUN 13  --  15  CREATININE 1.04*  --  1.26*  CALCIUM 7.5*  --  7.7*  MG  --   --  2.5*  AST 17  --   --   ALT 14  --   --   ALKPHOS 54  --   --   BILITOT 1.0  --   --   < > = values in this interval not displayed.  Microbiology Results   Recent Results (from the past 240 hour(s))  Blood Culture (routine x 2)     Status: None (Preliminary result)   Collection Time: 12/30/15 11:43 AM  Result Value Ref Range Status   Specimen Description BLOOD RIGHT [facility]  Final   Special Requests BOTTLES  DRAWN AEROBIC AND ANAEROBIC 9ML  Final   Culture NO GROWTH 2 DAYS  Final   Report Status PENDING  Incomplete  Blood Culture (routine x 2)     Status: None (Preliminary result)   Collection Time: 12/30/15 11:43 AM  Result Value Ref Range Status   Specimen Description BLOOD RIGHT HAND  Final   Special Requests   Final    BOTTLES DRAWN AEROBIC AND ANAEROBIC AER 5ML ANA 4ML   Culture NO GROWTH 2 DAYS  Final   Report Status PENDING  Incomplete  Urine culture     Status: None   Collection Time: 12/30/15  1:00 PM  Result Value Ref Range Status   Specimen Description URINE, RANDOM  Final   Special Requests NONE  Final   Culture NO GROWTH Performed at [facility]   Final   Report Status 12/31/2015 FINAL  Final    RADIOLOGY:  Ct Abdomen Pelvis Wo Contrast  Result Date: 12/30/2015 CLINICAL DATA:  Altered mental status EXAM: CT ABDOMEN AND PELVIS WITHOUT CONTRAST TECHNIQUE: Multidetector CT imaging of the abdomen and pelvis was performed following the standard protocol without IV contrast. COMPARISON:  CT left hip 01/16/2012 FINDINGS: Small pleural effusions and dependent atelectasis. Tiny pericardial effusion. Abdominal exam is limited by streak artifact from the upper  extremities There are low-density lesions in the left lobe of the liver which are nonspecific. They cannot be characterize secondary to streak artifact. They are both in the lateral segment of the left lobe. The largest measures 1.6 cm. No evidence of liver injury Gallbladder, spleen, pancreas, kidneys are within normal limits. Adrenal glands are prominent in a hyperplasia pattern No evidence of bowel obstruction. No obvious focal mass in the colon. Appendix is nonvisualized. Uterus is absent. Adnexa are within normal limits. Bladder is distended. Small para-aortic lymph nodes. No hemoperitoneum. No evidence of retroperitoneal hemorrhage. There is a small amount of layering free-fluid in the pelvis Lower lumbar fusion hardware is in place. There is no vertebral compression deformity. IMPRESSION: Small pleural effusions.  Tiny pericardial effusion. Nonspecific hypodensities in the liver cannot be characterize completely on this exam. Routine CT abdomen pelvis with contrast can be performed to further delineate. No evidence of organ injury. Bladder is distended. Aortic atherosclerosis. Electronically Signed   By: Marybelle Killings M.D.   On: 12/30/2015 15:26   Ct Head Wo Contrast  Result Date: 12/30/2015 CLINICAL DATA:  Altered mental status. Unresponsive. Diabetes. Hypertension. EXAM: CT HEAD WITHOUT CONTRAST TECHNIQUE: Contiguous axial images were obtained from the base of the skull through the vertex without intravenous contrast. COMPARISON:  None. FINDINGS: Brain: Moderate low density in the periventricular white matter likely related to small vessel disease. Dilated perivascular space versus remote lacunar infarct in the left caudate. No mass lesion, hemorrhage, hydrocephalus, acute infarct, intra-axial, or extra-axial fluid collection. Vascular: No hyperdense vessel or unexpected calcification. Skull: No significant soft tissue swelling.  No skull fracture. Sinuses/Orbits: Normal orbits and globes. Complete  opacification of the right ethmoid air cells and middle ear. Other paranasal sinuses and mastoid air cells are clear. Probable cerumen in the right external ear. Other: None IMPRESSION: 1.  No acute intracranial abnormality. 2. Fluid the right mastoid air cells and middle ear is likely chronic and incompletely evaluated on this nondedicated study. 3. Moderate small vessel ischemic change. Electronically Signed   By: Abigail Miyamoto M.D.   On: 12/30/2015 15:26     Management plans discussed with the patient, family and they are in  agreement.  CODE STATUS:     Code Status Orders        Start     Ordered   12/30/15 1936  Full code  Continuous     12/30/15 1935    Code Status History    Date Active Date Inactive Code Status Order ID Comments User Context   This patient has a current code status but no historical code status.      TOTAL TIME TAKING CARE OF THIS PATIENT: 40 minutes.    Tiarna Koppen M.D on 01/01/2016 at 1:41 PM  Between 7am to 6pm - Pager - [phone] After 6pm go to [email or web address] - password [email or web address] [facility]  [facility]  Office  [phone]  CC: Primary care physician; [place]

## 2016-01-04 LAB — CULTURE, BLOOD (ROUTINE X 2)
CULTURE: NO GROWTH
Culture: NO GROWTH

## 2017-09-07 IMAGING — CT CT ABD-PELV W/O CM
2 of 4 series · 16 of 46 positions shown, 18 images · non-contrast
Comparison: CT left hip 01/16/2012

CLINICAL DATA: Altered mental status

EXAM:
CT ABDOMEN AND PELVIS WITHOUT CONTRAST
TECHNIQUE: Multidetector CT imaging of the abdomen and pelvis was performed
following the standard protocol without IV contrast.

[Series 3: axial st · axial · 0.69mm/px · z∈[-239,+156]mm · 13 of 87 slices shown, 15 images]
[im 4/87  soft-tissue]
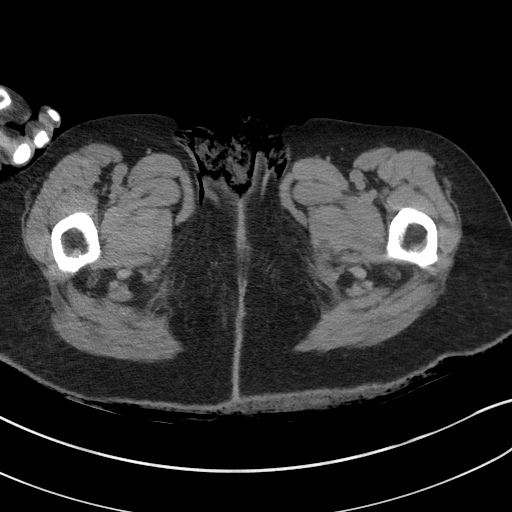
[im 4/87  bone]
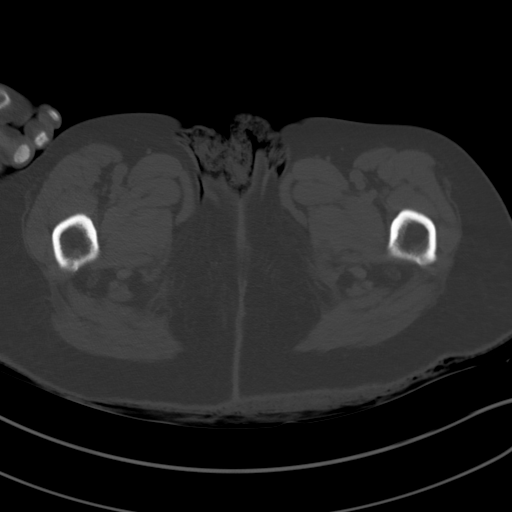
[im 11/87  soft-tissue]
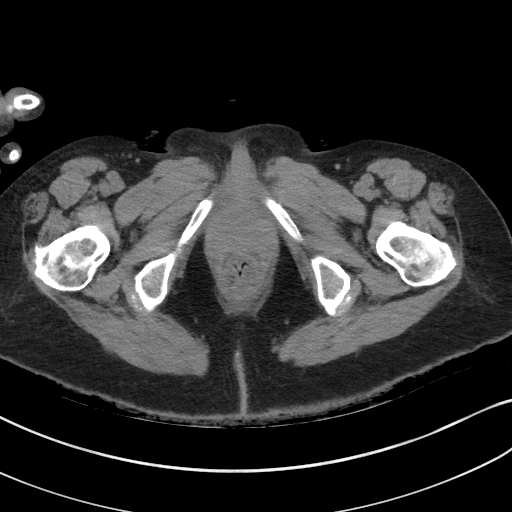
[im 18/87  soft-tissue]
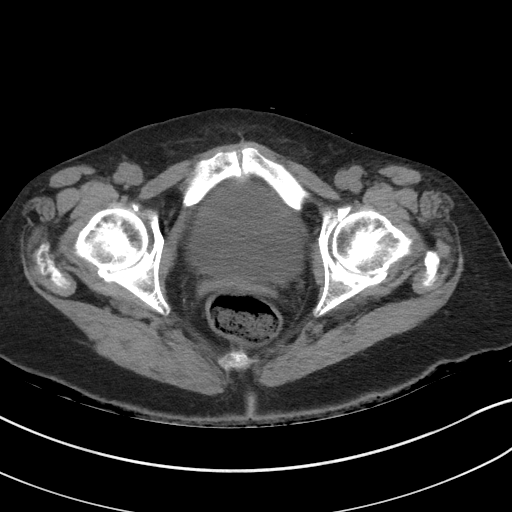
[im 25/87  soft-tissue]
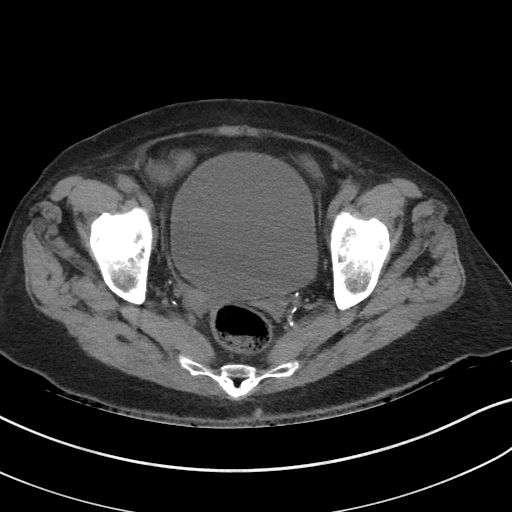
[im 31/87  soft-tissue]
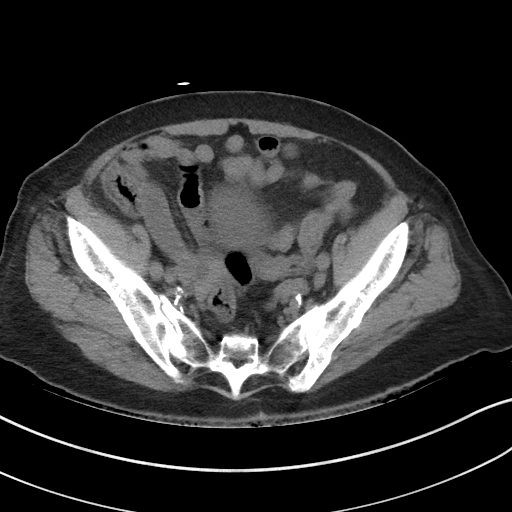
[im 38/87  soft-tissue]
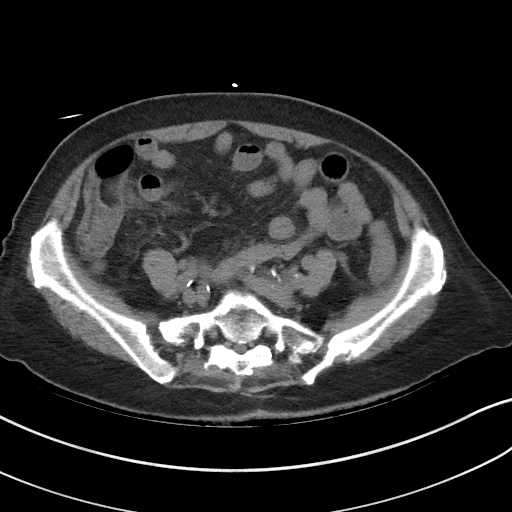
[im 45/87  soft-tissue]
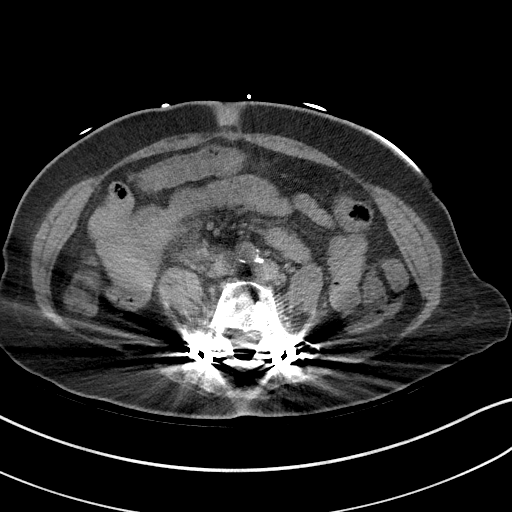
[im 49/87  soft-tissue]
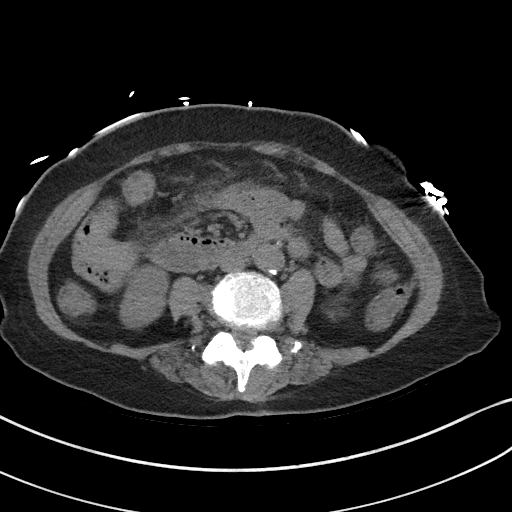
[im 56/87  soft-tissue]
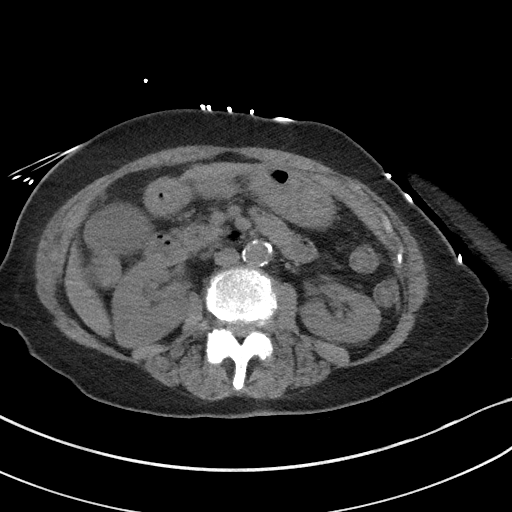
[im 56/87  bone]
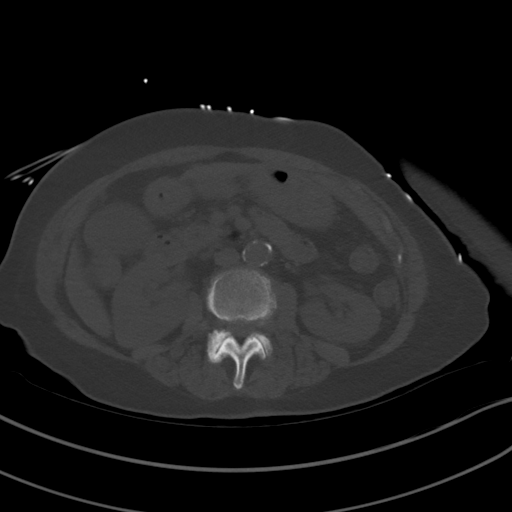
[im 62/87  soft-tissue]
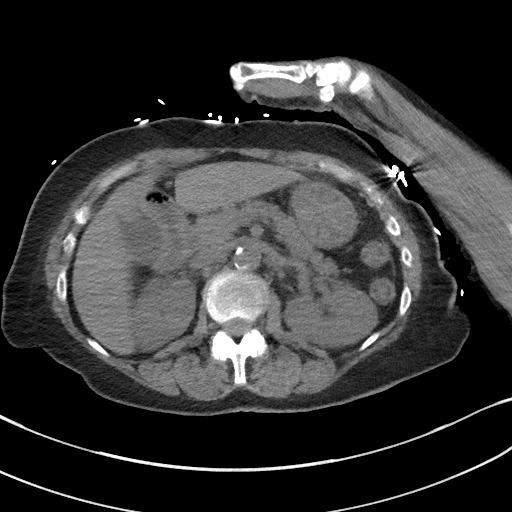
[im 69/87  soft-tissue]
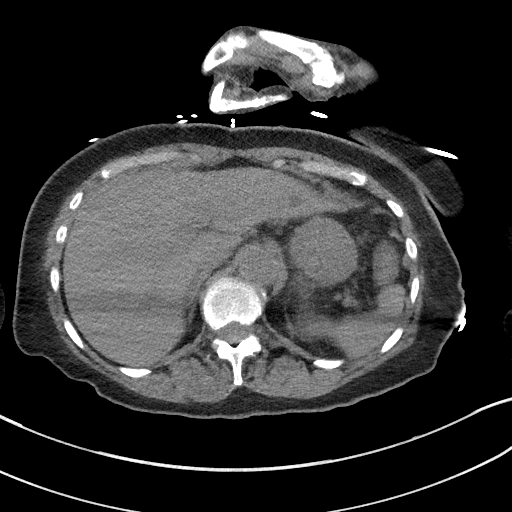
[im 76/87  soft-tissue]
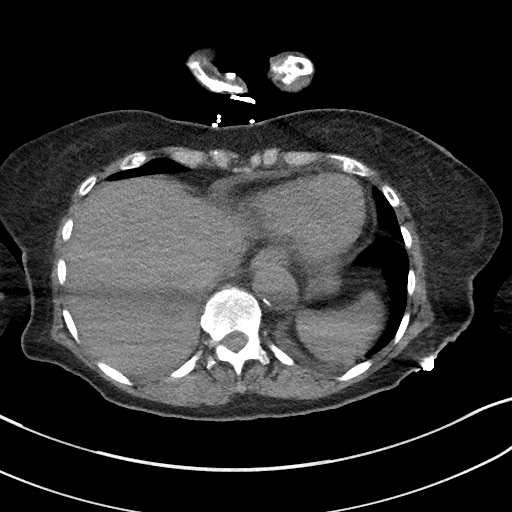
[im 83/87  soft-tissue]
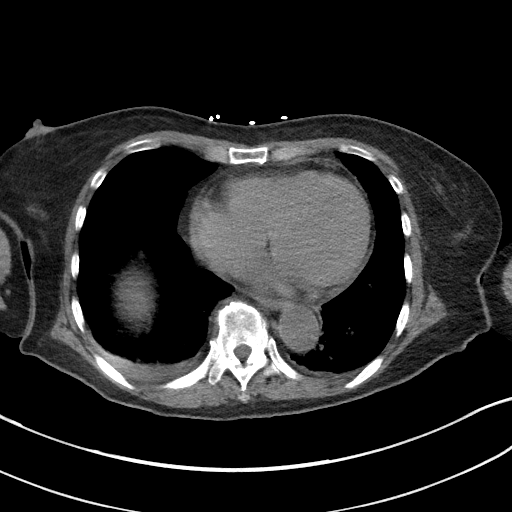

[Series 6: coronal st · coronal · 0.70mm/px · 3 of 75 slices shown]
[im 25/75  soft-tissue]
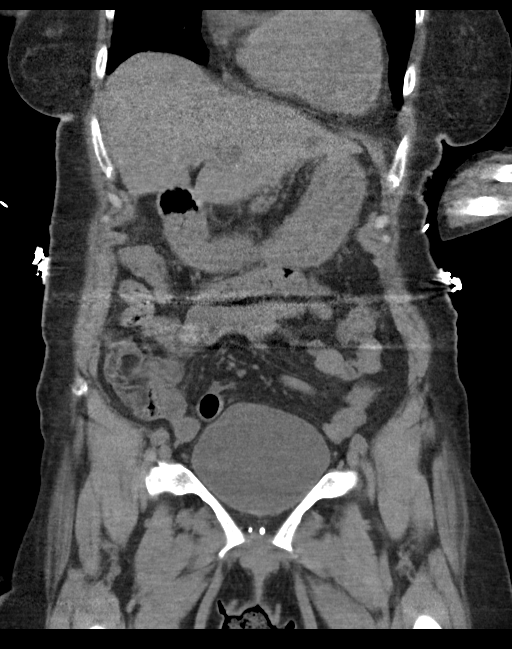
[im 33/75  soft-tissue]
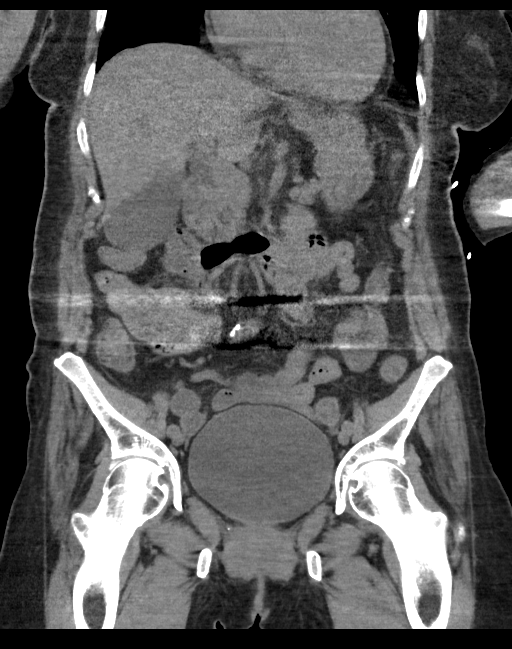
[im 42/75  soft-tissue]
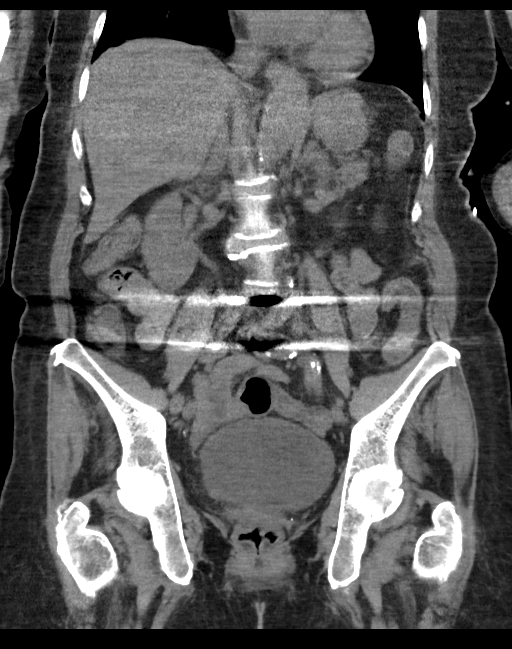

[16 of 46 positions shown; findings below may reference images not displayed]

FINDINGS: Small pleural effusions and dependent atelectasis. Tiny pericardial
effusion.

Abdominal exam is limited by streak artifact from the upper
extremities

There are low-density lesions in the left lobe of the liver which
are nonspecific. They cannot be characterize secondary to streak
artifact. They are both in the lateral segment of the left lobe. The
largest measures 1.6 cm. No evidence of liver injury

Gallbladder, spleen, pancreas, kidneys are within normal limits.

Adrenal glands are prominent in a hyperplasia pattern

No evidence of bowel obstruction. No obvious focal mass in the
colon. Appendix is nonvisualized.

Uterus is absent. Adnexa are within normal limits. Bladder is
distended.

Small para-aortic lymph nodes.

No hemoperitoneum. No evidence of retroperitoneal hemorrhage. There
is a small amount of layering free-fluid in the pelvis

Lower lumbar fusion hardware is in place. There is no vertebral
compression deformity.
IMPRESSION: Small pleural effusions.  Tiny pericardial effusion.

Nonspecific hypodensities in the liver cannot be characterize
completely on this exam. Routine CT abdomen pelvis with contrast can
be performed to further delineate.

No evidence of organ injury.

Bladder is distended.

Aortic atherosclerosis.

## 2017-09-07 IMAGING — DX DG CHEST 1V PORT
1 series · 1 of 1 positions shown · non-contrast
Comparison: February 26, 2014

CLINICAL DATA: Altered mental status.  Hypertension.

EXAM:
PORTABLE CHEST 1 VIEW

[chest ap]
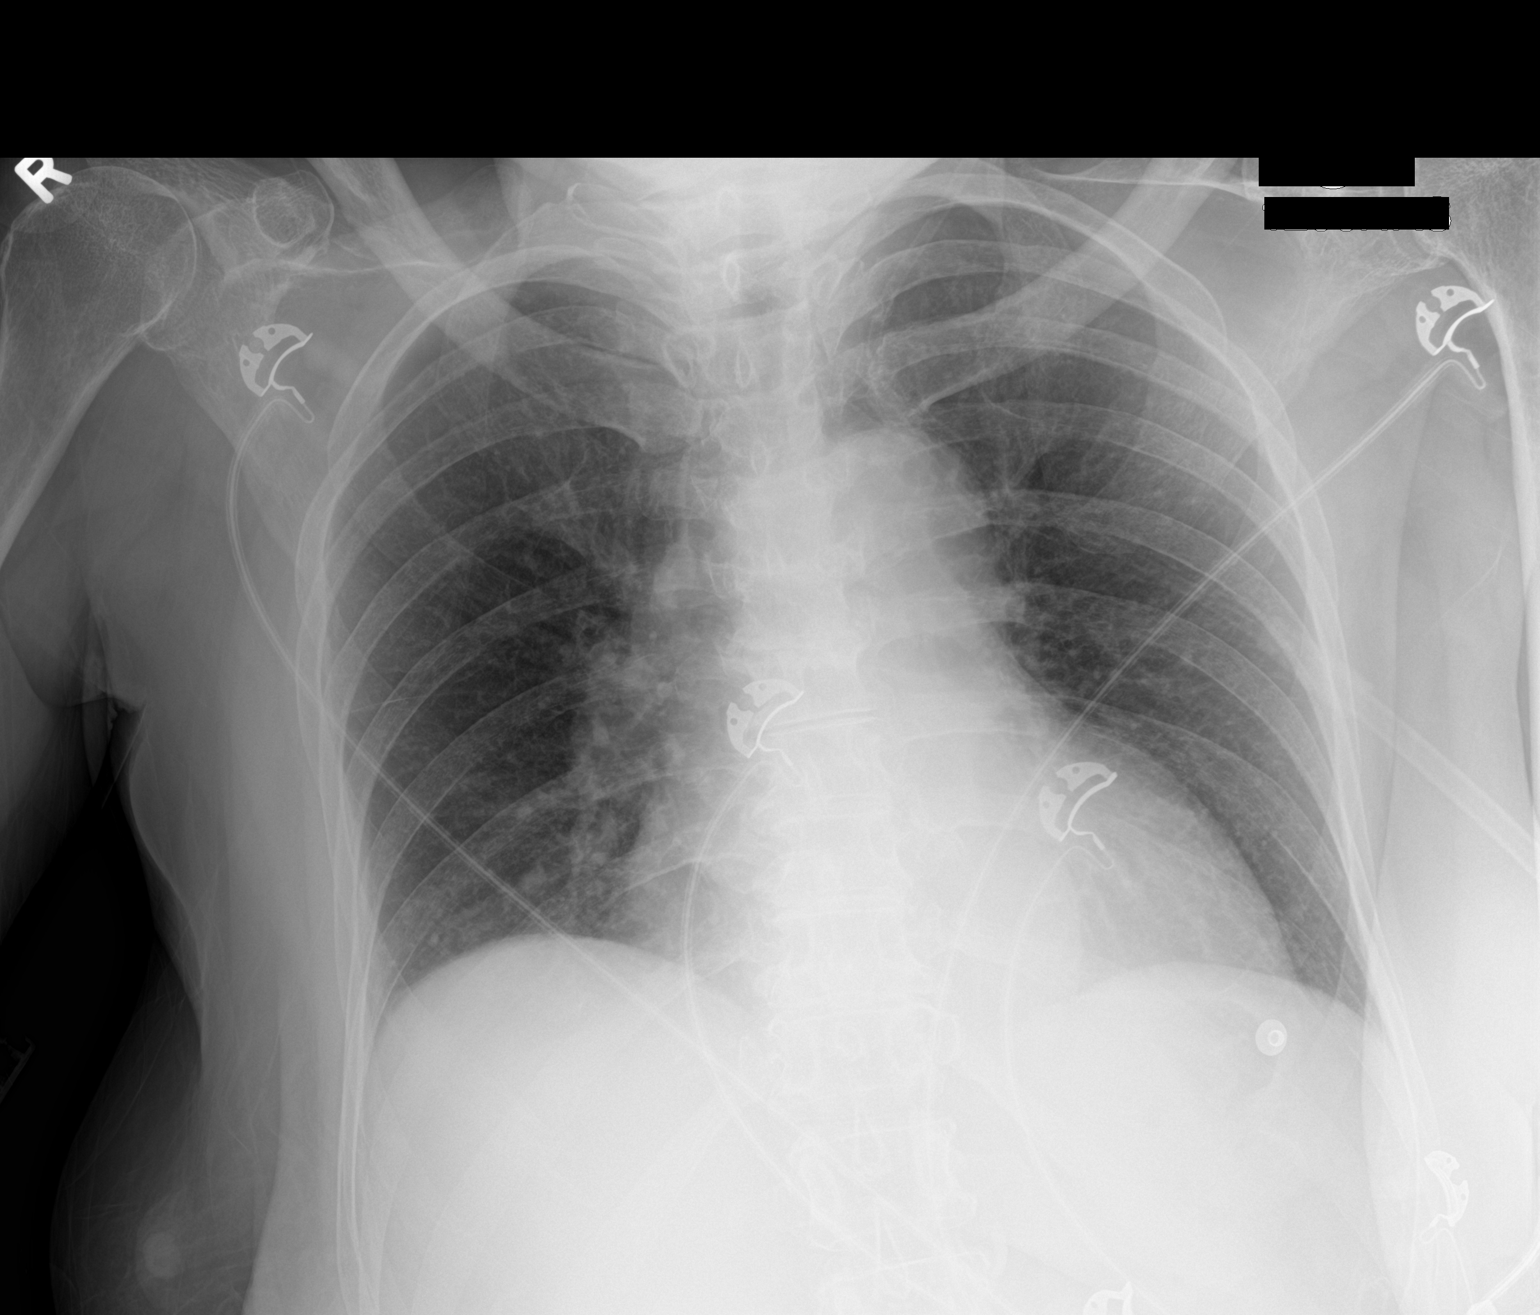

[1 of 1 positions shown; findings below may reference images not displayed]

FINDINGS: There is no edema or consolidation. Heart is mildly enlarged with
pulmonary vascular within normal limits. No adenopathy. No bone
lesions.
IMPRESSION: Mild cardiac enlargement.  No edema or consolidation.

## 2017-09-22 ENCOUNTER — Other Ambulatory Visit: Payer: Self-pay | Admitting: Physician Assistant

## 2017-09-22 DIAGNOSIS — Z13820 Encounter for screening for osteoporosis: Secondary | ICD-10-CM

## 2017-10-24 ENCOUNTER — Other Ambulatory Visit: Payer: Medicare HMO

## 2018-05-15 ENCOUNTER — Ambulatory Visit
Admission: RE | Admit: 2018-05-15 | Discharge: 2018-05-15 | Disposition: A | Discharge status: Home / Self Care | Payer: Medicare HMO | Source: Ambulatory Visit | Attending: Physician Assistant | Admitting: Physician Assistant

## 2018-05-15 DIAGNOSIS — Z13820 Encounter for screening for osteoporosis: Secondary | ICD-10-CM | POA: Insufficient documentation

## 2019-03-12 ENCOUNTER — Encounter (INDEPENDENT_AMBULATORY_CARE_PROVIDER_SITE_OTHER): Payer: Self-pay | Admitting: Vascular Surgery

## 2019-03-12 ENCOUNTER — Ambulatory Visit (INDEPENDENT_AMBULATORY_CARE_PROVIDER_SITE_OTHER): Payer: Medicare HMO | Admitting: Vascular Surgery

## 2019-03-12 ENCOUNTER — Other Ambulatory Visit: Payer: Self-pay

## 2019-03-12 DIAGNOSIS — I70213 Atherosclerosis of native arteries of extremities with intermittent claudication, bilateral legs: Secondary | ICD-10-CM

## 2019-03-12 DIAGNOSIS — E1151 Type 2 diabetes mellitus with diabetic peripheral angiopathy without gangrene: Secondary | ICD-10-CM

## 2019-03-12 DIAGNOSIS — E119 Type 2 diabetes mellitus without complications: Secondary | ICD-10-CM | POA: Insufficient documentation

## 2019-03-12 DIAGNOSIS — I70219 Atherosclerosis of native arteries of extremities with intermittent claudication, unspecified extremity: Secondary | ICD-10-CM | POA: Insufficient documentation

## 2019-03-12 DIAGNOSIS — I10 Essential (primary) hypertension: Secondary | ICD-10-CM | POA: Diagnosis not present

## 2019-03-12 NOTE — Progress Notes (Signed)
MRN : [id number]  Regina Perkins is a 78 y.o. ([birth year]) female who presents with chief complaint of  Chief Complaint  Patient presents with  . New Patient (Initial Visit)    biltateral leg pain  .  History of Present Illness:    The patient is seen for evaluation of painful lower extremities and diminished pulses. Patient notes the pain is always associated with activity and is very consistent day today. Typically, the pain occurs at less than one block, progress is as activity continues to the point that the patient must stop walking. Resting including standing still for several minutes allowed resumption of the activity and the ability to walk a similar distance before stopping again. Uneven terrain and inclined shorten the distance. The pain has been progressive over the past several years. The patient states the inability to walk is now having a profound negative impact on quality of life and daily activities.  The patient denies rest pain or dangling of an extremity off the side of the bed during the night for relief. No open wounds or sores at this time. No prior interventions or surgeries.  No history of back problems or DJD of the lumbar sacral spine.   The patient denies changes in claudication symptoms or new rest pain symptoms.  No new ulcers or wounds of the foot.  The patient's blood pressure has been stable and relatively well controlled. The patient denies amaurosis fugax or recent TIA symptoms. There are no recent neurological changes noted. The patient denies history of DVT, PE or superficial thrombophlebitis. The patient denies recent episodes of angina or shortness of breath.   ABI's Rt=0.79 and Lt=0.81  Current Meds  Medication Sig  . brimonidine (ALPHAGAN) 0.2 % ophthalmic solution 1 drop 2 (two) times daily.  . cloNIDine (CATAPRES) 0.1 MG tablet Take by mouth.  . gabapentin (NEURONTIN) 300 MG capsule Take 300 mg by mouth 3 (three) times daily.  Marland Kitchen  lisinopril-hydrochlorothiazide (PRINZIDE,ZESTORETIC) 20-12.5 MG tablet Take 2 tablets by mouth daily.  Marland Kitchen loratadine (CLARITIN) 10 MG tablet Take 1 tablet (10 mg total) by mouth daily.  . metFORMIN (GLUCOPHAGE) 1000 MG tablet Take 1,000 mg by mouth 2 (two) times daily with a meal.  . metoprolol tartrate (LOPRESSOR) 25 MG tablet Take 25 mg by mouth 2 (two) times daily.    Past Medical History:  Diagnosis Date  . Diabetes mellitus without complication ([place])   . Hypertension     No past surgical history on file.  Social History Social History   Tobacco Use  . Smoking status: Current Every Day Smoker    Packs/day: 0.50    Types: Cigarettes  . Smokeless tobacco: Never Used  Substance Use Topics  . Alcohol use: No  . Drug use: No    Family History Family History  Problem Relation Age of Onset  . Asthma Mother   . Heart disease Mother   . Hypertension Mother   . Diabetes Mother   . Stroke Mother   . Diabetes Sister   . Diabetes Brother   No family history of bleeding/clotting disorders, porphyria or autoimmune disease   Allergies  Allergen Reactions  . Ivp Dye [Iodinated Diagnostic Agents]   . Shellfish Allergy Other (See Comments)    Reaction: unknown      REVIEW OF SYSTEMS (Negative unless checked)  Constitutional: [] Weight loss  [] Fever  [] Chills Cardiac: [] Chest pain   [] Chest pressure   [] Palpitations   [] Shortness of breath when laying flat   []   Shortness of breath with exertion. Vascular:  [x] Pain in legs with walking   [] Pain in legs at rest  [] History of DVT   [] Phlebitis   [] Swelling in legs   [] Varicose veins   [] Non-healing ulcers Pulmonary:   [] Uses home oxygen   [] Productive cough   [] Hemoptysis   [] Wheeze  [] COPD   [] Asthma Neurologic:  [] Dizziness   [] Seizures   [] History of stroke   [] History of TIA  [] Aphasia   [] Vissual changes   [] Weakness or numbness in arm   [] Weakness or numbness in leg Musculoskeletal:   [] Joint swelling   [] Joint pain   [] Low  back pain Hematologic:  [] Easy bruising  [] Easy bleeding   [] Hypercoagulable state   [] Anemic Gastrointestinal:  [] Diarrhea   [] Vomiting  [] Gastroesophageal reflux/heartburn   [] Difficulty swallowing. Genitourinary:  [] Chronic kidney disease   [] Difficult urination  [] Frequent urination   [] Blood in urine Skin:  [] Rashes   [] Ulcers  Psychological:  [] History of anxiety   []  History of major depression.  Physical Examination  Vitals:   03/12/19 1309  BP: (!) 219/78  Pulse: 66  Resp: 16  Weight: 149 lb (67.6 kg)  Height: 5\' 3"  (1.6 m)   Body mass index is 26.39 kg/m. Gen: WD/WN, NAD Head: [place]/AT, No temporalis wasting.  Ear/Nose/Throat: Hearing grossly intact, nares w/o erythema or drainage, poor dentition Eyes: PER, EOMI, sclera nonicteric.  Neck: Supple, no masses.  No bruit or JVD.  Pulmonary:  Good air movement, clear to auscultation bilaterally, no use of accessory muscles.  Cardiac: RRR, normal S1, S2, no Murmurs. Vascular:  Vessel Right Left  Radial Palpable Palpable  PT Not Palpable Not Palpable  DP Not Palpable Not Palpable   Gastrointestinal: soft, non-distended. No guarding/no peritoneal signs.  Musculoskeletal: M/S 5/5 throughout.  No deformity or atrophy.  Neurologic: CN 2-12 intact. Pain and light touch intact in extremities.  Symmetrical.  Speech is fluent. Motor exam as listed above. Psychiatric: Judgment intact, Mood & affect appropriate for pt's clinical situation. Dermatologic: No rashes or ulcers noted.  No changes consistent with cellulitis. Lymph : No Cervical lymphadenopathy, no lichenification or skin changes of chronic lymphedema.  CBC Lab Results  Component Value Date   WBC 14.8 (H) 01/01/2016   HGB 9.7 (L) 01/01/2016   HCT 28.0 (L) 01/01/2016   MCV 86.7 01/01/2016   PLT 279 01/01/2016    BMET    Component Value Date/Time   NA 135 01/01/2016 0457   NA 134 (L) 01/16/2012 1722   K 3.5 01/01/2016 0457   K 3.1 (L) 01/16/2012 1722   CL 100 (L)  01/01/2016 0457   CL 97 (L) 01/16/2012 1722   CO2 26 01/01/2016 0457   CO2 27 01/16/2012 1722   GLUCOSE 98 01/01/2016 0457   GLUCOSE 198 (H) 01/16/2012 1722   BUN 15 01/01/2016 0457   BUN 13 01/16/2012 1722   CREATININE 1.26 (H) 01/01/2016 0457   CREATININE 0.99 01/16/2012 1722   CALCIUM 7.7 (L) 01/01/2016 0457   CALCIUM 9.2 01/16/2012 1722   GFRNONAA 41 (L) 01/01/2016 0457   GFRNONAA 57 (L) 01/16/2012 1722   GFRAA 47 (L) 01/01/2016 0457   GFRAA >60 01/16/2012 1722   CrCl cannot be calculated (Patient's most recent lab result is older than the maximum 21 days allowed.).  COAG No results found for: INR, PROTIME  Radiology No results found.   Assessment/Plan 1. Atherosclerosis of native artery of both lower extremities with intermittent claudication (HCC)  Recommend:  The patient  has evidence of atherosclerosis of the lower extremities with claudication.  The patient does not voice lifestyle limiting changes at this point in time.  Noninvasive studies do not suggest clinically significant change.  No invasive studies, angiography or surgery at this time The patient should continue walking and begin a more formal exercise program.  The patient should continue antiplatelet therapy and aggressive treatment of the lipid abnormalities  No changes in the patient's medications at this time  The patient should continue wearing graduated compression socks 10-15 mmHg strength to control the mild edema.   - ABI; Future   2. Essential hypertension Continue antihypertensive medications as already ordered, these medications have been reviewed and there are no changes at this time.   3. Type 2 diabetes mellitus with diabetic peripheral angiopathy without gangrene, without long-term current use of insulin (HCC) Continue hypoglycemic medications as already ordered, these medications have been reviewed and there are no changes at this time.  Hgb A1C to be monitored as already arranged  by primary service    Hortencia Pilar, MD  03/12/2019 1:17 PM

## 2019-03-12 NOTE — Progress Notes (Signed)
f °

## 2019-04-03 ENCOUNTER — Encounter (INDEPENDENT_AMBULATORY_CARE_PROVIDER_SITE_OTHER): Payer: Self-pay

## 2019-08-27 ENCOUNTER — Encounter (INDEPENDENT_AMBULATORY_CARE_PROVIDER_SITE_OTHER): Payer: Medicare Other

## 2019-08-27 ENCOUNTER — Ambulatory Visit (INDEPENDENT_AMBULATORY_CARE_PROVIDER_SITE_OTHER): Payer: Medicare HMO | Admitting: Vascular Surgery

## 2021-01-30 DIAGNOSIS — H401430 Capsular glaucoma with pseudoexfoliation of lens, bilateral, stage unspecified: Secondary | ICD-10-CM | POA: Insufficient documentation

## 2021-01-30 DIAGNOSIS — H2512 Age-related nuclear cataract, left eye: Secondary | ICD-10-CM | POA: Insufficient documentation

## 2022-05-21 ENCOUNTER — Emergency Department: Payer: Medicare Other

## 2022-05-21 ENCOUNTER — Other Ambulatory Visit: Payer: Self-pay

## 2022-05-21 ENCOUNTER — Inpatient Hospital Stay
Admission: EM | Admit: 2022-05-21 | Discharge: 2022-05-27 | DRG: 180 | Disposition: A | Discharge status: Home / Self Care | Payer: Medicare Other | Attending: Student in an Organized Health Care Education/Training Program | Admitting: Student in an Organized Health Care Education/Training Program

## 2022-05-21 DIAGNOSIS — E1122 Type 2 diabetes mellitus with diabetic chronic kidney disease: Secondary | ICD-10-CM | POA: Diagnosis present

## 2022-05-21 DIAGNOSIS — I3139 Other pericardial effusion (noninflammatory): Secondary | ICD-10-CM

## 2022-05-21 DIAGNOSIS — J9811 Atelectasis: Secondary | ICD-10-CM | POA: Diagnosis present

## 2022-05-21 DIAGNOSIS — Z8249 Family history of ischemic heart disease and other diseases of the circulatory system: Secondary | ICD-10-CM

## 2022-05-21 DIAGNOSIS — Z6822 Body mass index (BMI) 22.0-22.9, adult: Secondary | ICD-10-CM

## 2022-05-21 DIAGNOSIS — Z91041 Radiographic dye allergy status: Secondary | ICD-10-CM

## 2022-05-21 DIAGNOSIS — E1151 Type 2 diabetes mellitus with diabetic peripheral angiopathy without gangrene: Secondary | ICD-10-CM | POA: Diagnosis present

## 2022-05-21 DIAGNOSIS — Z1152 Encounter for screening for COVID-19: Secondary | ICD-10-CM

## 2022-05-21 DIAGNOSIS — I5030 Unspecified diastolic (congestive) heart failure: Secondary | ICD-10-CM | POA: Clinically undetermined

## 2022-05-21 DIAGNOSIS — N1831 Chronic kidney disease, stage 3a: Secondary | ICD-10-CM | POA: Diagnosis present

## 2022-05-21 DIAGNOSIS — Z79899 Other long term (current) drug therapy: Secondary | ICD-10-CM

## 2022-05-21 DIAGNOSIS — I10 Essential (primary) hypertension: Secondary | ICD-10-CM | POA: Diagnosis present

## 2022-05-21 DIAGNOSIS — Z91013 Allergy to seafood: Secondary | ICD-10-CM

## 2022-05-21 DIAGNOSIS — J9601 Acute respiratory failure with hypoxia: Secondary | ICD-10-CM | POA: Diagnosis present

## 2022-05-21 DIAGNOSIS — Z823 Family history of stroke: Secondary | ICD-10-CM

## 2022-05-21 DIAGNOSIS — R634 Abnormal weight loss: Secondary | ICD-10-CM | POA: Diagnosis present

## 2022-05-21 DIAGNOSIS — E876 Hypokalemia: Secondary | ICD-10-CM | POA: Diagnosis present

## 2022-05-21 DIAGNOSIS — Z825 Family history of asthma and other chronic lower respiratory diseases: Secondary | ICD-10-CM

## 2022-05-21 DIAGNOSIS — C3492 Malignant neoplasm of unspecified part of left bronchus or lung: Secondary | ICD-10-CM | POA: Diagnosis not present

## 2022-05-21 DIAGNOSIS — Z7984 Long term (current) use of oral hypoglycemic drugs: Secondary | ICD-10-CM

## 2022-05-21 DIAGNOSIS — Z833 Family history of diabetes mellitus: Secondary | ICD-10-CM

## 2022-05-21 DIAGNOSIS — I2699 Other pulmonary embolism without acute cor pulmonale: Secondary | ICD-10-CM | POA: Diagnosis present

## 2022-05-21 DIAGNOSIS — Z720 Tobacco use: Secondary | ICD-10-CM | POA: Diagnosis present

## 2022-05-21 DIAGNOSIS — R0602 Shortness of breath: Secondary | ICD-10-CM | POA: Diagnosis not present

## 2022-05-21 DIAGNOSIS — F17210 Nicotine dependence, cigarettes, uncomplicated: Secondary | ICD-10-CM | POA: Diagnosis present

## 2022-05-21 DIAGNOSIS — I130 Hypertensive heart and chronic kidney disease with heart failure and stage 1 through stage 4 chronic kidney disease, or unspecified chronic kidney disease: Secondary | ICD-10-CM | POA: Diagnosis present

## 2022-05-21 DIAGNOSIS — Z86711 Personal history of pulmonary embolism: Secondary | ICD-10-CM | POA: Diagnosis present

## 2022-05-21 DIAGNOSIS — C3490 Malignant neoplasm of unspecified part of unspecified bronchus or lung: Secondary | ICD-10-CM

## 2022-05-21 DIAGNOSIS — R918 Other nonspecific abnormal finding of lung field: Secondary | ICD-10-CM

## 2022-05-21 DIAGNOSIS — E1129 Type 2 diabetes mellitus with other diabetic kidney complication: Secondary | ICD-10-CM | POA: Diagnosis present

## 2022-05-21 HISTORY — DX: Type 2 diabetes mellitus with other diabetic kidney complication: E11.29

## 2022-05-21 HISTORY — DX: Chronic kidney disease, stage 3a: N18.31

## 2022-05-21 HISTORY — DX: Essential (primary) hypertension: I10

## 2022-05-21 LAB — CBC
HCT: 34.6 % — ABNORMAL LOW (ref 36.0–46.0)
Hemoglobin: 10.8 g/dL — ABNORMAL LOW (ref 12.0–15.0)
MCH: 26.7 pg (ref 26.0–34.0)
MCHC: 31.2 g/dL (ref 30.0–36.0)
MCV: 85.6 fL (ref 80.0–100.0)
Platelets: 502 10*3/uL — ABNORMAL HIGH (ref 150–400)
RBC: 4.04 MIL/uL (ref 3.87–5.11)
RDW: 15.6 % — ABNORMAL HIGH (ref 11.5–15.5)
WBC: 10.6 10*3/uL — ABNORMAL HIGH (ref 4.0–10.5)
nRBC: 0 % (ref 0.0–0.2)

## 2022-05-21 LAB — D-DIMER, QUANTITATIVE: D-Dimer, Quant: 1.29 ug/mL-FEU — ABNORMAL HIGH (ref 0.00–0.50)

## 2022-05-21 LAB — BASIC METABOLIC PANEL
Anion gap: 13 (ref 5–15)
BUN: 27 mg/dL — ABNORMAL HIGH (ref 8–23)
CO2: 30 mmol/L (ref 22–32)
Calcium: 9.5 mg/dL (ref 8.9–10.3)
Chloride: 93 mmol/L — ABNORMAL LOW (ref 98–111)
Creatinine, Ser: 1.18 mg/dL — ABNORMAL HIGH (ref 0.44–1.00)
GFR, Estimated: 46 mL/min — ABNORMAL LOW (ref >60)
Glucose, Bld: 176 mg/dL — ABNORMAL HIGH (ref 70–99)
Potassium: 3.3 mmol/L — ABNORMAL LOW (ref 3.5–5.1)
Sodium: 136 mmol/L (ref 135–145)

## 2022-05-21 LAB — RESP PANEL BY RT-PCR (FLU A&B, COVID) ARPGX2
Influenza A by PCR: NEGATIVE
Influenza B by PCR: NEGATIVE
SARS Coronavirus 2 by RT PCR: NEGATIVE

## 2022-05-21 LAB — TROPONIN I (HIGH SENSITIVITY)
Troponin I (High Sensitivity): <2 ng/L (ref <18)
Troponin I (High Sensitivity): <2 ng/L (ref <18)

## 2022-05-21 MED ORDER — IPRATROPIUM-ALBUTEROL 0.5-2.5 (3) MG/3ML IN SOLN
3.0000 mL | Freq: Once | RESPIRATORY_TRACT | Status: AC
  Administered 2022-05-21: 3 mL via RESPIRATORY_TRACT
  Filled 2022-05-21: qty 3

## 2022-05-21 MED ORDER — METFORMIN HCL 500 MG PO TABS
1000.0000 mg | ORAL_TABLET | Freq: Two times a day (BID) | ORAL | Status: DC
  Administered 2022-05-22: 1000 mg via ORAL
  Filled 2022-05-21: qty 2

## 2022-05-21 MED ORDER — AMITRIPTYLINE HCL 25 MG PO TABS
25.0000 mg | ORAL_TABLET | Freq: Every morning | ORAL | Status: DC
  Administered 2022-05-22 – 2022-05-26 (×5): 25 mg via ORAL
  Filled 2022-05-21 (×6): qty 1

## 2022-05-21 MED ORDER — LISINOPRIL 10 MG PO TABS
40.0000 mg | ORAL_TABLET | Freq: Every day | ORAL | Status: DC
  Filled 2022-05-21: qty 4

## 2022-05-21 NOTE — ED Provider Notes (Signed)
Updated patient on plan of care, she is comfortable with plan to stay in the ER overnight with anticipated ventilation scan in the morning.  Verified her home medications with her and have written for   Delman Kitten, MD 05/21/22 2339

## 2022-05-21 NOTE — ED Notes (Signed)
Pt ambulated in room for 1 minute, O2 saturation 100% during ambulation.

## 2022-05-21 NOTE — ED Triage Notes (Signed)
Pt comes via EMS from home with sob that started yesterday. [facility] and [facility]er. Pt states she went home and is now having sob on exertion.   O2-97 % RA for EMS. Pt states it gets worse when she is trying to breath.

## 2022-05-21 NOTE — ED Provider Notes (Signed)
[facility] Provider Note    Event Date/Time   First MD Initiated Contact with Patient 05/21/22 1959     (approximate)   History   Shortness of Breath   HPI  Regina Perkins is a 82 y.o. female with a history of previous severe sepsis, acute respiratory failure with hypoxia and discharge summary from 2017.   Review of primary care notes from [facility] indicates history of diabetes, hypertension, and chronic kidney disease stage III  Patient reports that for roughly a week now she has had some cough and shortness of breath and sputum production.  She saw her doctor at [facility] today and she reports that an EKG and a chest x-ray and they told her that they could not detect anything as to the cause and referred to the ER.  She reports that it is a feeling of shortness of breath and when she is talking she just feels like she cannot catch her breath.  She is not wheezing.  She is not short of breath at rest.  She has not had any swelling or unusual weight gain she has not had any episodes of chest pain.  She reports when the symptoms first started she did have some body aches and a fever on the first day or 2.  Now she reports she just feels like she cannot catch her breath.  No history of blood clots.  She does report a severe allergy to IVP dye including severe swelling.  No recent trips or travel.  No leg swelling no surgeries.  No bloody cough.  No chest pain  Physical Exam   Triage Vital Signs: ED Triage Vitals  Enc Vitals Group     BP 05/21/22 1620 (!) 151/70     Pulse Rate 05/21/22 1620 88     Resp 05/21/22 1620 16     Temp 05/21/22 1620 98 F (36.7 C)     Temp Source 05/21/22 1620 Oral     SpO2 05/21/22 1620 96 %     Weight --      Height --      Head Circumference --      Peak Flow --      Pain Score 05/21/22 1604 0     Pain Loc --      Pain Edu? --      Excl. in [place]? --     Most recent vital signs: Vitals:   05/21/22 2230 05/21/22 2300   BP: (!) 142/71 (!) 123/52  Pulse: 85 88  Resp: 17 16  Temp:    SpO2: 98% 97%     General: Awake, no distress.  She appears quite comfortable when she does speak though she speaks in 2-3 word phrases and appears dyspneic while talking but does not experience any desaturations. CV:  Good peripheral perfusion.  Normal tones and rate Resp:  Normal effort.  Clear bilaterally.  Work of breathing appears normal but when she starts to speak she will start to speak in 2-3 word phrases and appears dyspneic while talking. Abd:  No distention.  Abdomen soft nontender Other:  No lower extremity edema.  No venous cords or congestion.  No calf tenderness.   ED Results / Procedures / Treatments   Labs (all labs ordered are listed, but only abnormal results are displayed) Labs Reviewed  BASIC METABOLIC PANEL - Abnormal; Notable for the following components:      Result Value   Potassium 3.3 (*)  Chloride 93 (*)    Glucose, Bld 176 (*)    BUN 27 (*)    Creatinine, Ser 1.18 (*)    GFR, Estimated 46 (*)    All other components within normal limits  CBC - Abnormal; Notable for the following components:   WBC 10.6 (*)    Hemoglobin 10.8 (*)    HCT 34.6 (*)    RDW 15.6 (*)    Platelets 502 (*)    All other components within normal limits  D-DIMER, QUANTITATIVE - Abnormal; Notable for the following components:   D-Dimer, Quant 1.29 (*)    All other components within normal limits  RESP PANEL BY RT-PCR (FLU A&B, COVID) ARPGX2  TROPONIN I (HIGH SENSITIVITY)  TROPONIN I (HIGH SENSITIVITY)     EKG  And interpreted by me at 1615 heart rate 90 QRS 80 QTc 430 Normal sinus rhythm, evidence of possible old inferior infarct.  No obvious acute ischemia   RADIOLOGY  Chest x-ray interpreted by me as normal  Negative lower extremity dvt study   PROCEDURES:  Critical Care performed: No  Procedures   MEDICATIONS ORDERED IN ED: Medications  lisinopril (ZESTRIL) tablet 40 mg (has no  administration in time range)  metFORMIN (GLUCOPHAGE) tablet 1,000 mg (has no administration in time range)  amitriptyline (ELAVIL) tablet 25 mg (has no administration in time range)  ipratropium-albuterol (DUONEB) 0.5-2.5 (3) MG/3ML nebulizer solution 3 mL (3 mLs Nebulization Given 05/21/22 2052)     IMPRESSION / MDM / ASSESSMENT AND PLAN / ED COURSE  I reviewed the triage vital signs and the nursing notes.                              Differential diagnosis includes, but is not limited to, possible pneumonia, viral syndrome, bronchitis, low risk for pulmonary embolism without obvious risk factors will send D-dimer.  Also check lower extremity DVT studies she reports severe anaphylactic type reaction to IVP dye and thus CT angio cannot be utilized if D-dimer is positive.  If negative though I think this would be exclusionary.  She denies any chest pain her EKG does not show evidence of any acute ischemia and her initial troponin is normal.  Her second troponin pending at sign-out, Dr. Archie Balboa to follow  Based on her cough that she has been experiencing some sputum production and that the symptoms started with fevers and chills I suspect this may be viral or bronchitis.  Will trial a nebulizer to see if this helps alleviate any of her symptomatology though she does not have hypoxia or wheezing to noted.  She ambulates without desaturation.  Patient's presentation is most consistent with acute complicated illness / injury requiring diagnostic workup.   The patient is on the cardiac monitor to evaluate for evidence of arrhythmia and/or significant heart rate changes.  Clinical Course as of 05/21/22 2339  Ludwig Clarks May 21, 2022  2039 Patient ambulated with 100% SpO2 and no desaturations. [MQ]    Clinical Course User Index [MQ] Delman Kitten, MD   Positive D-dimer.  Low pretest probability for PE but positive D-dimer now.  If DVT studies are negative, plan to proceed with VQ scan in the morning.  Have  discussed with Dr. Laurence Ferrari of radiology who will have the tech made aware of the pending VQ scan and complete in the morning  Ongoing care assigned to Dr. Archie Balboa, patient being observed for chest pain overnight and pending ventilation/perfusion  scan to exclude pulmonary embolism in the setting of elevated D-dimer with severe contrast allergy in the morning.  If this study is negative anticipate likely discharge to home follow-up with PCP  FINAL CLINICAL IMPRESSION(S) / ED DIAGNOSES   Final diagnoses:  Shortness of breath     Rx / DC Orders   ED Discharge Orders     None        Note:  This document was prepared using Dragon voice recognition software and may include unintentional dictation errors.   Delman Kitten, MD 05/21/22 [phone]

## 2022-05-22 ENCOUNTER — Encounter: Payer: Self-pay | Admitting: Internal Medicine

## 2022-05-22 ENCOUNTER — Emergency Department: Payer: Medicare Other

## 2022-05-22 DIAGNOSIS — Z823 Family history of stroke: Secondary | ICD-10-CM | POA: Diagnosis not present

## 2022-05-22 DIAGNOSIS — E1151 Type 2 diabetes mellitus with diabetic peripheral angiopathy without gangrene: Secondary | ICD-10-CM | POA: Diagnosis present

## 2022-05-22 DIAGNOSIS — R918 Other nonspecific abnormal finding of lung field: Secondary | ICD-10-CM | POA: Diagnosis not present

## 2022-05-22 DIAGNOSIS — E876 Hypokalemia: Secondary | ICD-10-CM | POA: Diagnosis present

## 2022-05-22 DIAGNOSIS — R0602 Shortness of breath: Secondary | ICD-10-CM | POA: Diagnosis present

## 2022-05-22 DIAGNOSIS — I10 Essential (primary) hypertension: Secondary | ICD-10-CM | POA: Diagnosis not present

## 2022-05-22 DIAGNOSIS — E1129 Type 2 diabetes mellitus with other diabetic kidney complication: Secondary | ICD-10-CM | POA: Diagnosis not present

## 2022-05-22 DIAGNOSIS — C3490 Malignant neoplasm of unspecified part of unspecified bronchus or lung: Secondary | ICD-10-CM | POA: Diagnosis not present

## 2022-05-22 DIAGNOSIS — I5030 Unspecified diastolic (congestive) heart failure: Secondary | ICD-10-CM | POA: Diagnosis not present

## 2022-05-22 DIAGNOSIS — N1831 Chronic kidney disease, stage 3a: Secondary | ICD-10-CM | POA: Diagnosis not present

## 2022-05-22 DIAGNOSIS — I2699 Other pulmonary embolism without acute cor pulmonale: Secondary | ICD-10-CM | POA: Diagnosis not present

## 2022-05-22 DIAGNOSIS — J9811 Atelectasis: Secondary | ICD-10-CM | POA: Diagnosis present

## 2022-05-22 DIAGNOSIS — Z91041 Radiographic dye allergy status: Secondary | ICD-10-CM | POA: Diagnosis not present

## 2022-05-22 DIAGNOSIS — F17210 Nicotine dependence, cigarettes, uncomplicated: Secondary | ICD-10-CM | POA: Diagnosis present

## 2022-05-22 DIAGNOSIS — Z6822 Body mass index (BMI) 22.0-22.9, adult: Secondary | ICD-10-CM | POA: Diagnosis not present

## 2022-05-22 DIAGNOSIS — C3492 Malignant neoplasm of unspecified part of left bronchus or lung: Secondary | ICD-10-CM | POA: Diagnosis present

## 2022-05-22 DIAGNOSIS — Z79899 Other long term (current) drug therapy: Secondary | ICD-10-CM | POA: Diagnosis not present

## 2022-05-22 DIAGNOSIS — Z86711 Personal history of pulmonary embolism: Secondary | ICD-10-CM | POA: Diagnosis present

## 2022-05-22 DIAGNOSIS — Z91013 Allergy to seafood: Secondary | ICD-10-CM | POA: Diagnosis not present

## 2022-05-22 DIAGNOSIS — Z7984 Long term (current) use of oral hypoglycemic drugs: Secondary | ICD-10-CM | POA: Diagnosis not present

## 2022-05-22 DIAGNOSIS — Z1152 Encounter for screening for COVID-19: Secondary | ICD-10-CM | POA: Diagnosis not present

## 2022-05-22 DIAGNOSIS — R634 Abnormal weight loss: Secondary | ICD-10-CM | POA: Diagnosis present

## 2022-05-22 DIAGNOSIS — Z833 Family history of diabetes mellitus: Secondary | ICD-10-CM | POA: Diagnosis not present

## 2022-05-22 DIAGNOSIS — Z720 Tobacco use: Secondary | ICD-10-CM | POA: Diagnosis present

## 2022-05-22 DIAGNOSIS — Z8249 Family history of ischemic heart disease and other diseases of the circulatory system: Secondary | ICD-10-CM | POA: Diagnosis not present

## 2022-05-22 DIAGNOSIS — I3139 Other pericardial effusion (noninflammatory): Secondary | ICD-10-CM | POA: Diagnosis present

## 2022-05-22 DIAGNOSIS — J9601 Acute respiratory failure with hypoxia: Secondary | ICD-10-CM | POA: Diagnosis present

## 2022-05-22 DIAGNOSIS — I2609 Other pulmonary embolism with acute cor pulmonale: Secondary | ICD-10-CM | POA: Diagnosis not present

## 2022-05-22 DIAGNOSIS — E1122 Type 2 diabetes mellitus with diabetic chronic kidney disease: Secondary | ICD-10-CM | POA: Diagnosis present

## 2022-05-22 DIAGNOSIS — I130 Hypertensive heart and chronic kidney disease with heart failure and stage 1 through stage 4 chronic kidney disease, or unspecified chronic kidney disease: Secondary | ICD-10-CM | POA: Diagnosis present

## 2022-05-22 DIAGNOSIS — Z825 Family history of asthma and other chronic lower respiratory diseases: Secondary | ICD-10-CM | POA: Diagnosis not present

## 2022-05-22 LAB — MAGNESIUM: Magnesium: 1.4 mg/dL — ABNORMAL LOW (ref 1.7–2.4)

## 2022-05-22 LAB — CBC
HCT: 29.4 % — ABNORMAL LOW (ref 36.0–46.0)
Hemoglobin: 9.2 g/dL — ABNORMAL LOW (ref 12.0–15.0)
MCH: 26.8 pg (ref 26.0–34.0)
MCHC: 31.3 g/dL (ref 30.0–36.0)
MCV: 85.7 fL (ref 80.0–100.0)
Platelets: 441 10*3/uL — ABNORMAL HIGH (ref 150–400)
RBC: 3.43 MIL/uL — ABNORMAL LOW (ref 3.87–5.11)
RDW: 15.9 % — ABNORMAL HIGH (ref 11.5–15.5)
WBC: 9.8 10*3/uL (ref 4.0–10.5)
nRBC: 0 % (ref 0.0–0.2)

## 2022-05-22 LAB — BRAIN NATRIURETIC PEPTIDE: B Natriuretic Peptide: 28.1 pg/mL (ref 0.0–100.0)

## 2022-05-22 LAB — HEPARIN LEVEL (UNFRACTIONATED): Heparin Unfractionated: 0.26 IU/mL — ABNORMAL LOW (ref 0.30–0.70)

## 2022-05-22 LAB — CBG MONITORING, ED
Glucose-Capillary: 160 mg/dL — ABNORMAL HIGH (ref 70–99)
Glucose-Capillary: 108 mg/dL — ABNORMAL HIGH (ref 70–99)
Glucose-Capillary: 112 mg/dL — ABNORMAL HIGH (ref 70–99)

## 2022-05-22 LAB — HEMOGLOBIN A1C
Hgb A1c MFr Bld: 7.1 % — ABNORMAL HIGH (ref 4.8–5.6)
Mean Plasma Glucose: 157.07 mg/dL

## 2022-05-22 LAB — APTT: aPTT: 36 seconds (ref 24–36)

## 2022-05-22 LAB — PHOSPHORUS: Phosphorus: 3 mg/dL (ref 2.5–4.6)

## 2022-05-22 LAB — PROTIME-INR
INR: 1.1 (ref 0.8–1.2)
Prothrombin Time: 14.3 seconds (ref 11.4–15.2)

## 2022-05-22 MED ORDER — HYDRALAZINE HCL 20 MG/ML IJ SOLN: 5.0000 mg | INTRAMUSCULAR | Status: DC | PRN

## 2022-05-22 MED ORDER — ALBUTEROL SULFATE (2.5 MG/3ML) 0.083% IN NEBU: 3.0000 mL | INHALATION_SOLUTION | RESPIRATORY_TRACT | Status: DC | PRN

## 2022-05-22 MED ORDER — TECHNETIUM TO 99M ALBUMIN AGGREGATED
4.0000 | Freq: Once | INTRAVENOUS | Status: AC | PRN
  Administered 2022-05-22: 4.37 via INTRAVENOUS

## 2022-05-22 MED ORDER — HEPARIN BOLUS VIA INFUSION
3000.0000 [IU] | Freq: Once | INTRAVENOUS | Status: AC
  Administered 2022-05-22: 3000 [IU] via INTRAVENOUS
  Filled 2022-05-22: qty 3000

## 2022-05-22 MED ORDER — MAGNESIUM SULFATE 2 GM/50ML IV SOLN
2.0000 g | Freq: Once | INTRAVENOUS | Status: AC
  Administered 2022-05-22: 2 g via INTRAVENOUS
  Filled 2022-05-22: qty 50

## 2022-05-22 MED ORDER — ACETAMINOPHEN 325 MG PO TABS
650.0000 mg | ORAL_TABLET | Freq: Four times a day (QID) | ORAL | Status: DC | PRN
  Administered 2022-05-25 – 2022-05-27 (×2): 650 mg via ORAL
  Filled 2022-05-22 (×2): qty 2

## 2022-05-22 MED ORDER — HEPARIN BOLUS VIA INFUSION
800.0000 [IU] | Freq: Once | INTRAVENOUS | Status: AC
  Administered 2022-05-22: 800 [IU] via INTRAVENOUS
  Filled 2022-05-22: qty 800

## 2022-05-22 MED ORDER — POTASSIUM CHLORIDE CRYS ER 20 MEQ PO TBCR
40.0000 meq | EXTENDED_RELEASE_TABLET | Freq: Once | ORAL | Status: AC
  Administered 2022-05-22: 40 meq via ORAL
  Filled 2022-05-22: qty 2

## 2022-05-22 MED ORDER — HEPARIN (PORCINE) 25000 UT/250ML-% IV SOLN
900.0000 [IU]/h | INTRAVENOUS | Status: DC
  Administered 2022-05-22: 700 [IU]/h via INTRAVENOUS
  Filled 2022-05-22: qty 250

## 2022-05-22 MED ORDER — DM-GUAIFENESIN ER 30-600 MG PO TB12: 1.0000 | ORAL_TABLET | Freq: Two times a day (BID) | ORAL | Status: DC | PRN

## 2022-05-22 MED ORDER — AMLODIPINE BESYLATE 10 MG PO TABS
10.0000 mg | ORAL_TABLET | Freq: Every day | ORAL | Status: DC
  Administered 2022-05-22 – 2022-05-27 (×6): 10 mg via ORAL
  Filled 2022-05-22 (×3): qty 1
  Filled 2022-05-22: qty 2
  Filled 2022-05-22: qty 1
  Filled 2022-05-22: qty 2

## 2022-05-22 MED ORDER — NICOTINE 21 MG/24HR TD PT24
21.0000 mg | MEDICATED_PATCH | Freq: Every day | TRANSDERMAL | Status: DC
  Administered 2022-05-22 – 2022-05-27 (×6): 21 mg via TRANSDERMAL
  Filled 2022-05-22 (×6): qty 1

## 2022-05-22 MED ORDER — INSULIN ASPART 100 UNIT/ML IJ SOLN
0.0000 [IU] | Freq: Three times a day (TID) | INTRAMUSCULAR | Status: DC
  Administered 2022-05-22: 2 [IU] via SUBCUTANEOUS
  Administered 2022-05-23: 3 [IU] via SUBCUTANEOUS
  Administered 2022-05-24 (×2): 2 [IU] via SUBCUTANEOUS
  Administered 2022-05-25: 1 [IU] via SUBCUTANEOUS
  Administered 2022-05-25: 2 [IU] via SUBCUTANEOUS
  Administered 2022-05-27: 1 [IU] via SUBCUTANEOUS
  Filled 2022-05-22 (×7): qty 1

## 2022-05-22 MED ORDER — ONDANSETRON HCL 4 MG/2ML IJ SOLN: 4.0000 mg | Freq: Three times a day (TID) | INTRAMUSCULAR | Status: DC | PRN

## 2022-05-22 MED ORDER — LISINOPRIL 20 MG PO TABS
40.0000 mg | ORAL_TABLET | Freq: Every day | ORAL | Status: DC
  Administered 2022-05-22 – 2022-05-27 (×6): 40 mg via ORAL
  Filled 2022-05-22: qty 2
  Filled 2022-05-22: qty 4
  Filled 2022-05-22: qty 2
  Filled 2022-05-22: qty 4
  Filled 2022-05-22 (×2): qty 2

## 2022-05-22 MED ORDER — INSULIN ASPART 100 UNIT/ML IJ SOLN: 0.0000 [IU] | Freq: Every day | INTRAMUSCULAR | Status: DC

## 2022-05-22 NOTE — H&P (Addendum)
History and Physical    Regina Perkins VZD:[id number] DOB: 1940-11-28 DOA: 05/21/2022  Referring MD/NP/PA:   [facility]P: [facility] [place]   Patient coming from:  The patient is coming from home.  At baseline, pt is independent for most of ADL.        Chief Complaint: SOB  HPI: Regina Perkins is a 82 y.o. female with medical history significant of hypertension, diabetes mellitus, PVD, CKD-3A, tobacco abuse, who presents with shortness breath.  Pt states that she has shortness of breath for more than a week.  She has cough with little white mucus production.  She had mild chest pain 2 days ago which has resolved.  Currently patient does not have active chest pain.  No fever or chills.  Patient was seen in urgent care yesterday, and given inhaler without improvement.  She continues to have shortness of breath, which has been progressively worsening.  No recent long distance traveling.  Denies tenderness in the calf areas.  Patient does not have nausea vomiting, diarrhea or abdominal pain.  No symptoms of UTI.  Denies recent fall or head injury.  No rectal bleeding or dark stool.  Patient is normally not using oxygen, but was found to have oxygen desaturation to 86% on room air, which improved to 94% on 2 L oxygen.  Data reviewed independently and ED Course: pt was found to have positive D-dimer 1.29, WBC 10.6, troponin negative x 2, renal function close to baseline, potassium 3.3, temperature normal, blood pressure 145/48, heart rate 97, RR 20, oxygen saturation 86% on room air, which improved to 94% on 2 L oxygen.  Chest x-ray negative.  Lower extremity venous Doppler negative for DVT.  VQ scan showed high probability of PE.  Patient is admitted to PCU as inpatient.  V/Q scan: Large segmental perfusion defect of the left upper lobe without corresponding radiographic abnormality. Additional bilateral wedge-shaped subsegmental branch perfusion defects.   IMPRESSION: High  probability for pulmonary embolism.   EKG: I have personally reviewed.  Sinus rhythm, QTc 425, LAD, Q waves in lead III/aVF, low voltage.   Review of Systems:   General: no fevers, chills, no body weight gain, fatigue HEENT: no blurry vision, hearing changes or sore throat Respiratory: has dyspnea, coughing, no wheezing CV: no chest pain, no palpitations GI: no nausea, vomiting, abdominal pain, diarrhea, constipation GU: no dysuria, burning on urination, increased urinary frequency, hematuria  Ext: no leg edema Neuro: no unilateral weakness, numbness, or tingling, no vision change or hearing loss Skin: no rash, no skin tear. MSK: No muscle spasm, no deformity, no limitation of range of movement in spin Heme: No easy bruising.  Travel history: No recent long distant travel.   Allergy:  Allergies  Allergen Reactions   Ivp Dye [Iodinated Contrast Media]    Shellfish Allergy Other (See Comments)    Reaction: unknown     Past Medical History:  Diagnosis Date   Chronic kidney disease, stage 3a ([place])    Diabetes mellitus without complication ([place])    HTN (hypertension)    Hypertension    Type II diabetes mellitus with renal manifestations ([place])     No past surgical history on file.  Social History:  reports that she has been smoking cigarettes. She has been smoking an average of .5 packs per day. She has never used smokeless tobacco. She reports that she does not drink alcohol and does not use drugs.  Family History:  Family History  Problem Relation  Age of Onset   Asthma Mother    Heart disease Mother    Hypertension Mother    Diabetes Mother    Stroke Mother    Diabetes Sister    Diabetes Brother      Prior to Admission medications   Medication Sig Start Date End Date Taking? Authorizing Provider  brimonidine (ALPHAGAN) 0.2 % ophthalmic solution 1 drop 2 (two) times daily. 03/01/19   [provider]  cloNIDine (CATAPRES) 0.1 MG tablet Take by mouth.     [provider]  gabapentin (NEURONTIN) 300 MG capsule Take 300 mg by mouth 3 (three) times daily.    [provider]  latanoprost (XALATAN) 0.005 % ophthalmic solution 1 drop at bedtime. 02/10/19   [provider]  levofloxacin (LEVAQUIN) 750 MG tablet Take 1 tablet (750 mg total) by mouth every other day. Patient not taking: Reported on 03/12/2019 01/01/16   Fritzi Mandes, MD  lisinopril-hydrochlorothiazide (PRINZIDE,ZESTORETIC) 20-12.5 MG tablet Take 2 tablets by mouth daily.    [provider]  loratadine (CLARITIN) 10 MG tablet Take 1 tablet (10 mg total) by mouth daily. 01/01/16   Fritzi Mandes, MD  metFORMIN (GLUCOPHAGE) 1000 MG tablet Take 1,000 mg by mouth 2 (two) times daily with a meal.    [provider]  metoprolol tartrate (LOPRESSOR) 25 MG tablet Take 25 mg by mouth 2 (two) times daily.    [provider]    Physical Exam: Vitals:   05/22/22 1028 05/22/22 1030 05/22/22 1100 05/22/22 1130  BP:  (!) 141/70 (!) 144/73 121/72  Pulse:  84 99 87  Resp:  16    Temp:      TempSrc:      SpO2:  97% 96% 97%  Weight: 54.4 kg     Height: 5\' 1"  (1.549 m)      General: Not in acute distress HEENT:       Eyes: PERRL, EOMI, no scleral icterus.       ENT: No discharge from the ears and nose, no pharynx injection, no tonsillar enlargement.        Neck: No JVD, no bruit, no mass felt. Heme: No neck lymph node enlargement. Cardiac: S1/S2, RRR, No murmurs, No gallops or rubs. Respiratory: No rales, wheezing, rhonchi or rubs. GI: Soft, nondistended, nontender, no rebound pain, no organomegaly, BS present. GU: No hematuria Ext: No pitting leg edema bilaterally. 1+DP/PT pulse bilaterally. Musculoskeletal: No joint deformities, No joint redness or warmth, no limitation of ROM in spin. Skin: No rashes.  Neuro: Alert, oriented X3, cranial nerves II-XII grossly intact, moves all extremities normally. Psych: Patient is not psychotic, no suicidal or  hemocidal ideation.  Labs on Admission: I have personally reviewed following labs and imaging studies  CBC: Recent Labs  Lab 05/21/22 1621  WBC 10.6*  HGB 10.8*  HCT 34.6*  MCV 85.6  PLT 366*   Basic Metabolic Panel: Recent Labs  Lab 05/21/22 1621 05/22/22 1037 05/22/22 1038  NA 136  --   --   K 3.3*  --   --   CL 93*  --   --   CO2 30  --   --   GLUCOSE 176*  --   --   BUN 27*  --   --   CREATININE 1.18*  --   --   CALCIUM 9.5  --   --   MG  --   --  1.4*  PHOS  --  3.0  --  GFR: Estimated Creatinine Clearance: 28.2 mL/min (A) (by C-G formula based on SCr of 1.18 mg/dL (H)). Liver Function Tests: No results for input(s): "AST", "ALT", "ALKPHOS", "BILITOT", "PROT", "ALBUMIN" in the last 168 hours. No results for input(s): "LIPASE", "AMYLASE" in the last 168 hours. No results for input(s): "AMMONIA" in the last 168 hours. Coagulation Profile: Recent Labs  Lab 05/22/22 1038  INR 1.1   Cardiac Enzymes: No results for input(s): "CKTOTAL", "CKMB", "CKMBINDEX", "TROPONINI" in the last 168 hours. BNP (last 3 results) No results for input(s): "PROBNP" in the last 8760 hours. HbA1C: No results for input(s): "HGBA1C" in the last 72 hours. CBG: Recent Labs  Lab 05/22/22 1123  GLUCAP 160*   Lipid Profile: No results for input(s): "CHOL", "HDL", "LDLCALC", "TRIG", "CHOLHDL", "LDLDIRECT" in the last 72 hours. Thyroid Function Tests: No results for input(s): "TSH", "T4TOTAL", "FREET4", "T3FREE", "THYROIDAB" in the last 72 hours. Anemia Panel: No results for input(s): "VITAMINB12", "FOLATE", "FERRITIN", "TIBC", "IRON", "RETICCTPCT" in the last 72 hours. Urine analysis:    Component Value Date/Time   COLORURINE STRAW (A) 12/30/2015 1300   APPEARANCEUR CLEAR (A) 12/30/2015 1300   APPEARANCEUR Cloudy 01/16/2012 1937   LABSPEC 1.005 12/30/2015 1300   LABSPEC 1.028 01/16/2012 1937   PHURINE 7.0 12/30/2015 1300   GLUCOSEU >500 (A) 12/30/2015 1300   GLUCOSEU Negative  01/16/2012 1937   HGBUR 2+ (A) 12/30/2015 1300   BILIRUBINUR NEGATIVE 12/30/2015 1300   BILIRUBINUR Negative 01/16/2012 1937   KETONESUR 1+ (A) 12/30/2015 1300   PROTEINUR 30 (A) 12/30/2015 1300   NITRITE NEGATIVE 12/30/2015 1300   LEUKOCYTESUR NEGATIVE 12/30/2015 1300   LEUKOCYTESUR Negative 01/16/2012 1937   Sepsis Labs: @LABRCNTIP (procalcitonin:4,lacticidven:4) ) Recent Results (from the past 240 hour(s))  Resp Panel by RT-PCR (Flu A&B, Covid) Anterior Nasal Swab     Status: None   Collection Time: 05/21/22  8:52 PM   Specimen: Anterior Nasal Swab  Result Value Ref Range Status   SARS Coronavirus 2 by RT PCR NEGATIVE NEGATIVE Final    Comment: (NOTE) SARS-CoV-2 target nucleic acids are NOT DETECTED.  The SARS-CoV-2 RNA is generally detectable in upper respiratory specimens during the acute phase of infection. The lowest concentration of SARS-CoV-2 viral copies this assay can detect is 138 copies/mL. A negative result does not preclude SARS-Cov-2 infection and should not be used as the sole basis for treatment or other patient management decisions. A negative result may occur with  improper specimen collection/handling, submission of specimen other than nasopharyngeal swab, presence of viral mutation(s) within the areas targeted by this assay, and inadequate number of viral copies(<138 copies/mL). A negative result must be combined with clinical observations, patient history, and epidemiological information. The expected result is Negative.  Fact Sheet for Patients:  EntrepreneurPulse.com.au  Fact Sheet for Healthcare Providers:  IncredibleEmployment.be  This test is no t yet approved or cleared by the [place] FDA and  has been authorized for detection and/or diagnosis of SARS-CoV-2 by FDA under an Emergency Use Authorization (EUA). This EUA will remain  in effect (meaning this test can be used) for the duration of the COVID-19  declaration under Section 564(b)(1) of the Act, 21 U.S.C.section 360bbb-3(b)(1), unless the authorization is terminated  or revoked sooner.       Influenza A by PCR NEGATIVE NEGATIVE Final   Influenza B by PCR NEGATIVE NEGATIVE Final    Comment: (NOTE) The Xpert Xpress SARS-CoV-2/FLU/RSV plus assay is intended as an aid in the diagnosis of influenza from Nasopharyngeal swab specimens  and should not be used as a sole basis for treatment. Nasal washings and aspirates are unacceptable for Xpert Xpress SARS-CoV-2/FLU/RSV testing.  Fact Sheet for Patients: EntrepreneurPulse.com.au  Fact Sheet for Healthcare Providers: IncredibleEmployment.be  This test is not yet approved or cleared by the [place] FDA and has been authorized for detection and/or diagnosis of SARS-CoV-2 by FDA under an Emergency Use Authorization (EUA). This EUA will remain in effect (meaning this test can be used) for the duration of the COVID-19 declaration under Section 564(b)(1) of the Act, 21 U.S.C. section 360bbb-3(b)(1), unless the authorization is terminated or revoked.  Performed at [facility], [address] [place], [place] [address]      Radiological Exams on Admission: NM Pulmonary Perfusion  Result Date: 05/22/2022 CLINICAL DATA:  Pulmonary embolism (PE) suspected, high prob EXAM: NUCLEAR MEDICINE PERFUSION LUNG SCAN TECHNIQUE: Perfusion images were obtained in multiple projections after intravenous injection of radiopharmaceutical. Ventilation scans intentionally deferred if perfusion scan and chest x-ray adequate for interpretation during COVID 19 epidemic. RADIOPHARMACEUTICALS:  4.37 mCi Tc-51m MAA IV COMPARISON:  Chest x-ray 05/21/2022 FINDINGS: Large segmental perfusion defect of the left upper lobe without corresponding radiographic abnormality. Additional bilateral wedge-shaped subsegmental branch perfusion defects. IMPRESSION: High probability  for pulmonary embolism. Electronically Signed   By: Davina Poke D.O.   On: 05/22/2022 10:01   US Venous Img Lower Bilateral  Result Date: 05/21/2022 CLINICAL DATA:  Shortness of breath EXAM: BILATERAL LOWER EXTREMITY VENOUS DOPPLER ULTRASOUND TECHNIQUE: Gray-scale sonography with graded compression, as well as color Doppler and duplex ultrasound were performed to evaluate the lower extremity deep venous systems from the level of the common femoral vein and including the common femoral, femoral, profunda femoral, popliteal and calf veins including the posterior tibial, peroneal and gastrocnemius veins when visible. The superficial great saphenous vein was also interrogated. Spectral Doppler was utilized to evaluate flow at rest and with distal augmentation maneuvers in the common femoral, femoral and popliteal veins. COMPARISON:  None Available. FINDINGS: RIGHT LOWER EXTREMITY Common Femoral Vein: No evidence of thrombus. Normal compressibility, respiratory phasicity and response to augmentation. Saphenofemoral Junction: No evidence of thrombus. Normal compressibility and flow on color Doppler imaging. Profunda Femoral Vein: No evidence of thrombus. Normal compressibility and flow on color Doppler imaging. Femoral Vein: No evidence of thrombus. Normal compressibility, respiratory phasicity and response to augmentation. Popliteal Vein: No evidence of thrombus. Normal compressibility, respiratory phasicity and response to augmentation. Calf Veins: No evidence of thrombus. Normal compressibility and flow on color Doppler imaging. Superficial Great Saphenous Vein: No evidence of thrombus. Normal compressibility. Venous Reflux:  None. Other Findings:  None. LEFT LOWER EXTREMITY Common Femoral Vein: No evidence of thrombus. Normal compressibility, respiratory phasicity and response to augmentation. Saphenofemoral Junction: No evidence of thrombus. Normal compressibility and flow on color Doppler imaging. Profunda  Femoral Vein: No evidence of thrombus. Normal compressibility and flow on color Doppler imaging. Femoral Vein: No evidence of thrombus. Normal compressibility, respiratory phasicity and response to augmentation. Popliteal Vein: No evidence of thrombus. Normal compressibility, respiratory phasicity and response to augmentation. Calf Veins: No evidence of thrombus. Normal compressibility and flow on color Doppler imaging. Superficial Great Saphenous Vein: No evidence of thrombus. Normal compressibility. Venous Reflux:  None. Other Findings:  None. IMPRESSION: No evidence of deep venous thrombosis in either lower extremity. Electronically Signed   By: Inez Catalina M.D.   On: 05/21/2022 22:37   DG Chest 2 View  Result Date: 05/21/2022 CLINICAL DATA:  sob EXAM: CHEST -  2 VIEW COMPARISON:  December 30, 2015, February 26, 2014 FINDINGS: Evaluation is limited by rotation. The cardiomediastinal silhouette is unchanged and enlarged in contour. Tortuous thoracic aorta. No pleural effusion. No pneumothorax. No acute pleuroparenchymal abnormality. Visualized abdomen is unremarkable. Multilevel degenerative changes of the thoracic spine. IMPRESSION: No acute cardiopulmonary abnormality. Electronically Signed   By: Valentino Saxon M.D.   On: 05/21/2022 16:40      Assessment/Plan Principal Problem:   Acute pulmonary embolism (HCC) Active Problems:   Essential hypertension   Chronic kidney disease, stage 3a (HCC)   Type II diabetes mellitus with renal manifestations (HCC)   Hypokalemia   Hypomagnesemia   Tobacco abuse   Assessment and Plan:  Acute pulmonary embolism ([place]): V/Q showed large segmental perfusion defect of the left upper lobe without corresponding radiographic abnormality. Additional bilateral wedge-shaped subsegmental branch perfusion defects. Pt has 2 L new oxygen requirement.  Currently hemodynamically stable  -admit to PCU as inpt -heparin drip initiated -check BNP -prn albuterol nebs  and mucinex  -2D echocardiogram ordered --> if pt has right heart straining, will consult vascular surgeon  Essential hypertension -IV hydralazine as needed -Amlodipine, lisinopril  Chronic kidney disease, stage 3a ([place]): Renal function close to baseline.  Recent baseline creatinine 0.98 on 05/30/2020.  Her creatinine is 1.18, BUN 27, GFR 46 today -Follow-up with BMP  Type II diabetes mellitus with renal manifestations ([place]): no A1c available in epic.  Blood sugar 176.  Patient is taking metformin at home -SSI -Check A1c  Hypokalemia and hypomagnesemia: Potassium 3.3, magnesium 1.4 -Repleted potassium and magnesium -Check  phosphorus level --> 3.0  Tobacco abuse -Nicotine patch    DVT ppx: on IV Heparin    Code Status: Full code  Family Communication:   Yes, patient's granddaughter   at bed side.  I also spoke with her son  by phone  Disposition Plan:  Anticipate discharge back to previous environment  Consults called:  none   Admission status and Level of care: Progressive:     as inpt      Dispo: The patient is from: Home              Anticipated d/c is to: Home              Anticipated d/c date is: 2 days              Patient currently is not medically stable to d/c.    Severity of Illness:  The appropriate patient status for this patient is INPATIENT. Inpatient status is judged to be reasonable and necessary in order to provide the required intensity of service to ensure the patient's safety. The patient's presenting symptoms, physical exam findings, and initial radiographic and laboratory data in the context of their chronic comorbidities is felt to place them at high risk for further clinical deterioration. Furthermore, it is not anticipated that the patient will be medically stable for discharge from the hospital within 2 midnights of admission.   * I certify that at the point of admission it is my clinical judgment that the patient will require inpatient hospital  care spanning beyond 2 midnights from the point of admission due to high intensity of service, high risk for further deterioration and high frequency of surveillance required.*       Date of Service 05/22/2022    Ivor Costa [facility]   If 7PM-7AM, please contact night-coverage [email or web address] 05/22/2022, 1:03 PM

## 2022-05-22 NOTE — ED Notes (Signed)
Pt back from v/q scan. Provided water applesauce graham crackers.

## 2022-05-22 NOTE — ED Notes (Signed)
Dr Niu at bedside 

## 2022-05-22 NOTE — ED Notes (Signed)
Helped pt OOB to toilet. Pt has steady gait. Pt denies any dizziness.

## 2022-05-22 NOTE — ED Notes (Signed)
Pt going to v/q scan at this time.

## 2022-05-22 NOTE — ED Provider Notes (Addendum)
Patient is resting comfortably hemodynamics reviewed within normal limits, was put on minimal O2 for comfort while sleeping but I discontinued this and she has no desaturations.  She understands plan of care for VQ scan and if normal plan will be for discharge and outpatient follow-up.  Perfusion scan is highly suggestive of pulmonary embolism.  Hemodynamics appropriate reassuring him I wanted to discuss these results with the patient, she states that she has exertional dyspnea and shortness of breath when talking but none when at rest, when she is speaking to me her oxygen saturation drops to around 91% but recovers after speaking.  Given age and O2 levels upon talking and likely while exerting herself, I will admit her to the hospitalist service on heparin (no reported hx bleeding or allergy to heparin)      PROCEDURES:  Critical Care performed: Yes, see critical care procedure note(s)  .Critical Care  Performed by: Lucillie Garfinkel, MD Authorized by: Lucillie Garfinkel, MD   Critical care provider statement:    Critical care time (minutes):  30   Critical care was necessary to treat or prevent imminent or life-threatening deterioration of the following conditions: Pulmonary embolism with exertional hypoxia, IV heparin ordered.   Critical care was time spent personally by me on the following activities:  Development of treatment plan with patient or surrogate, discussions with consultants, evaluation of patient's response to treatment, examination of patient, ordering and review of laboratory studies, ordering and review of radiographic studies, ordering and performing treatments and interventions, pulse oximetry, re-evaluation of patient's condition and review of old charts   Note:  This document was prepared using Dragon voice recognition software and may include unintentional dictation errors.  Lucillie Garfinkel, MD 05/22/22 3817    Lucillie Garfinkel, MD 05/22/22 1013    Lucillie Garfinkel, MD 05/22/22 [phone]

## 2022-05-22 NOTE — Consult Note (Signed)
ANTICOAGULATION CONSULT NOTE - Initial Consult  Pharmacy Consult for heparin infusion Indication: pulmonary embolus  Allergies  Allergen Reactions   Ivp Dye [Iodinated Contrast Media]    Shellfish Allergy Other (See Comments)    Reaction: unknown     Patient Measurements: Height: 5\' 1"  (154.9 cm) Weight: 54.4 kg (120 lb) IBW/kg (Calculated) : 47.8 Heparin Dosing Weight: 54.4 kg  Vital Signs: Temp: 98 F (36.7 C) (01/27 1336) Temp Source: Oral (01/27 0749) BP: 125/82 (01/27 1800) Pulse Rate: 83 (01/27 1800)  Labs: Recent Labs    05/21/22 1620 05/21/22 1621 05/21/22 2036 05/22/22 1038 05/22/22 1904  HGB  --  10.8*  --   --  9.2*  HCT  --  34.6*  --   --  29.4*  PLT  --  502*  --   --  441*  APTT  --   --   --  36  --   LABPROT  --   --   --  14.3  --   INR  --   --   --  1.1  --   HEPARINUNFRC  --   --   --   --  0.26*  CREATININE  --  1.18*  --   --   --   TROPONINIHS <2  --  <2  --   --      Estimated Creatinine Clearance: 28.2 mL/min (A) (by C-G formula based on SCr of 1.18 mg/dL (H)).   Medical History: Past Medical History:  Diagnosis Date   Chronic kidney disease, stage 3a ([place])    Diabetes mellitus without complication ([place])    HTN (hypertension)    Hypertension    Type II diabetes mellitus with renal manifestations ([place])     Medications:  NO AC prior to admission noted  Assessment: Patient admitted with shortness of breath. PMH includes diabetes, hypertension, and chronic kidney disease stage III. Pharmacy consulted to manage heparin infusion for new dx PE.  Baseline Hgb and plt appropriate to start heparin. No s/sx of bleeding noted.  CrCl = 28.46ml/min  *Baseline aPTT and PT/INR order*  Goal of Therapy:  Heparin level 0.3-0.7 units/ml Monitor platelets by anticoagulation protocol: Yes  1/27 1904 HL 0.26, Subtherapeutic @ 700 un/hr   Plan:  --Give heparin 800 units IV x 1 --Increase heparin drip rate to 800 units/hr --Check heparin  level 8 hours after rate change --Daily CBC while on heparin drip  Beatris Si, PharmD 05/22/2022 7:27 PM

## 2022-05-22 NOTE — Consult Note (Signed)
ANTICOAGULATION CONSULT NOTE - Initial Consult  Pharmacy Consult for heparin infusion Indication: pulmonary embolus  Allergies  Allergen Reactions   Ivp Dye [Iodinated Contrast Media]    Shellfish Allergy Other (See Comments)    Reaction: unknown     Patient Measurements: Height: 5\' 1"  (154.9 cm) Weight: 54.4 kg (120 lb) IBW/kg (Calculated) : 47.8 Heparin Dosing Weight: 54.4 kg  Vital Signs: Temp: 98 F (36.7 C) (01/27 0749) Temp Source: Oral (01/27 0749) BP: 145/48 (01/27 0937) Pulse Rate: 87 (01/27 0938)  Labs: Recent Labs    05/21/22 1620 05/21/22 1621 05/21/22 2036  HGB  --  10.8*  --   HCT  --  34.6*  --   PLT  --  502*  --   CREATININE  --  1.18*  --   TROPONINIHS <2  --  <2    Estimated Creatinine Clearance: 28.2 mL/min (A) (by C-G formula based on SCr of 1.18 mg/dL (H)).   Medical History: Past Medical History:  Diagnosis Date   Chronic kidney disease, stage 3a ([place])    Diabetes mellitus without complication ([place])    HTN (hypertension)    Hypertension    Type II diabetes mellitus with renal manifestations ([place])     Medications:  NO AC prior to admission noted  Assessment: Patient admitted with shortness of breath. PMH includes diabetes, hypertension, and chronic kidney disease stage III. Pharmacy consulted to manage heparin infusion for new dx PE.  Baseline Hgb and plt appropriate to start heparin. No s/sx of bleeding noted.  CrCl = 28.45ml/min  *Baseline aPTT and PT/INR order*  Goal of Therapy:  Heparin level 0.3-0.7 units/ml Monitor platelets by anticoagulation protocol: Yes   Plan:  Give 3000 units bolus x 1 Start heparin infusion at 700 units/hr Check anti-Xa level in 8 hours and daily while on heparin Continue to monitor H&H and platelets  Christien Berthelot Rodriguez-Guzman PharmD, BCPS 05/22/2022 10:39 AM

## 2022-05-22 NOTE — ED Notes (Signed)
EDP at bedside talking to pt.

## 2022-05-23 ENCOUNTER — Inpatient Hospital Stay (HOSPITAL_COMMUNITY)
Admit: 2022-05-23 | Discharge: 2022-05-23 | Disposition: A | Discharge status: Home / Self Care | Payer: Medicare Other | Attending: Internal Medicine | Admitting: Internal Medicine

## 2022-05-23 ENCOUNTER — Other Ambulatory Visit: Payer: Self-pay

## 2022-05-23 DIAGNOSIS — I10 Essential (primary) hypertension: Secondary | ICD-10-CM | POA: Diagnosis not present

## 2022-05-23 DIAGNOSIS — Z720 Tobacco use: Secondary | ICD-10-CM

## 2022-05-23 DIAGNOSIS — E1129 Type 2 diabetes mellitus with other diabetic kidney complication: Secondary | ICD-10-CM

## 2022-05-23 DIAGNOSIS — E876 Hypokalemia: Secondary | ICD-10-CM

## 2022-05-23 DIAGNOSIS — N1831 Chronic kidney disease, stage 3a: Secondary | ICD-10-CM | POA: Diagnosis not present

## 2022-05-23 DIAGNOSIS — I2699 Other pulmonary embolism without acute cor pulmonale: Secondary | ICD-10-CM | POA: Diagnosis not present

## 2022-05-23 DIAGNOSIS — I2609 Other pulmonary embolism with acute cor pulmonale: Secondary | ICD-10-CM

## 2022-05-23 LAB — CBG MONITORING, ED
Glucose-Capillary: 101 mg/dL — ABNORMAL HIGH (ref 70–99)
Glucose-Capillary: 226 mg/dL — ABNORMAL HIGH (ref 70–99)
Glucose-Capillary: 82 mg/dL (ref 70–99)
Glucose-Capillary: 149 mg/dL — ABNORMAL HIGH (ref 70–99)

## 2022-05-23 LAB — ECHOCARDIOGRAM COMPLETE
AR max vel: 2.33 cm2
AV Peak grad: 5.7 mmHg
Ao pk vel: 1.19 m/s
Area-P 1/2: 4.49 cm2
Height: 61 in
P 1/2 time: 508 msec
S' Lateral: 2.3 cm
Weight: 1920 oz

## 2022-05-23 LAB — CBC
HCT: 29.5 % — ABNORMAL LOW (ref 36.0–46.0)
Hemoglobin: 9.2 g/dL — ABNORMAL LOW (ref 12.0–15.0)
MCH: 26.7 pg (ref 26.0–34.0)
MCHC: 31.2 g/dL (ref 30.0–36.0)
MCV: 85.5 fL (ref 80.0–100.0)
Platelets: 438 10*3/uL — ABNORMAL HIGH (ref 150–400)
RBC: 3.45 MIL/uL — ABNORMAL LOW (ref 3.87–5.11)
RDW: 15.9 % — ABNORMAL HIGH (ref 11.5–15.5)
WBC: 9.3 10*3/uL (ref 4.0–10.5)
nRBC: 0 % (ref 0.0–0.2)

## 2022-05-23 LAB — BASIC METABOLIC PANEL
Anion gap: 9 (ref 5–15)
BUN: 17 mg/dL (ref 8–23)
CO2: 29 mmol/L (ref 22–32)
Calcium: 8.7 mg/dL — ABNORMAL LOW (ref 8.9–10.3)
Chloride: 98 mmol/L (ref 98–111)
Creatinine, Ser: 0.83 mg/dL (ref 0.44–1.00)
GFR, Estimated: >60 mL/min (ref >60)
Glucose, Bld: 102 mg/dL — ABNORMAL HIGH (ref 70–99)
Potassium: 3.6 mmol/L (ref 3.5–5.1)
Sodium: 136 mmol/L (ref 135–145)

## 2022-05-23 LAB — MAGNESIUM: Magnesium: 1.6 mg/dL — ABNORMAL LOW (ref 1.7–2.4)

## 2022-05-23 LAB — HEPARIN LEVEL (UNFRACTIONATED)
Heparin Unfractionated: 0.41 IU/mL (ref 0.30–0.70)
Heparin Unfractionated: 0.26 IU/mL — ABNORMAL LOW (ref 0.30–0.70)

## 2022-05-23 MED ORDER — BENZONATATE 200 MG PO CAPS: 200.0000 mg | ORAL_CAPSULE | Freq: Three times a day (TID) | ORAL | Status: DC | PRN

## 2022-05-23 MED ORDER — APIXABAN 5 MG PO TABS
10.0000 mg | ORAL_TABLET | Freq: Two times a day (BID) | ORAL | Status: DC
  Filled 2022-05-23: qty 2

## 2022-05-23 MED ORDER — APIXABAN 5 MG PO TABS: ORAL_TABLET | ORAL | 0 refills | Status: DC

## 2022-05-23 MED ORDER — HEPARIN (PORCINE) 25000 UT/250ML-% IV SOLN
1000.0000 [IU]/h | INTRAVENOUS | Status: DC
  Administered 2022-05-23: 900 [IU]/h via INTRAVENOUS
  Administered 2022-05-24: 1000 [IU]/h via INTRAVENOUS
  Filled 2022-05-23 (×2): qty 250

## 2022-05-23 MED ORDER — HEPARIN BOLUS VIA INFUSION
800.0000 [IU] | Freq: Once | INTRAVENOUS | Status: DC
  Filled 2022-05-23: qty 800

## 2022-05-23 MED ORDER — HEPARIN BOLUS VIA INFUSION
800.0000 [IU] | Freq: Once | INTRAVENOUS | Status: AC
  Administered 2022-05-23: 800 [IU] via INTRAVENOUS
  Filled 2022-05-23: qty 800

## 2022-05-23 MED ORDER — APIXABAN 5 MG PO TABS: 5.0000 mg | ORAL_TABLET | Freq: Two times a day (BID) | ORAL | Status: DC

## 2022-05-23 NOTE — Consult Note (Signed)
ANTICOAGULATION CONSULT NOTE   Pharmacy Consult for heparin infusion Indication: pulmonary embolus  Allergies  Allergen Reactions   Ivp Dye [Iodinated Contrast Media]    Shellfish Allergy Other (See Comments)    Reaction: unknown     Patient Measurements: Height: 5\' 1"  (154.9 cm) Weight: 54.4 kg (120 lb) IBW/kg (Calculated) : 47.8 Heparin Dosing Weight: 54.4 kg  Vital Signs: Temp: 97.5 F (36.4 C) (01/28 1500) Temp Source: Oral (01/28 1500) BP: 116/65 (01/28 1500) Pulse Rate: 95 (01/28 1500)  Labs: Recent Labs    05/21/22 1620 05/21/22 1621 05/21/22 1621 05/21/22 2036 05/22/22 1038 05/22/22 1904 05/23/22 0405 05/23/22 1131  HGB  --  10.8*   < >  --   --  9.2* 9.2*  --   HCT  --  34.6*  --   --   --  29.4* 29.5*  --   PLT  --  502*  --   --   --  441* 438*  --   APTT  --   --   --   --  36  --   --   --   LABPROT  --   --   --   --  14.3  --   --   --   INR  --   --   --   --  1.1  --   --   --   HEPARINUNFRC  --   --   --   --   --  0.26* 0.41 0.26*  CREATININE  --  1.18*  --   --   --   --  0.83  --   TROPONINIHS <2  --   --  <2  --   --   --   --    < > = values in this interval not displayed.     Estimated Creatinine Clearance: 40.1 mL/min (by C-G formula based on SCr of 0.83 mg/dL).   Medical History: Past Medical History:  Diagnosis Date   Chronic kidney disease, stage 3a ([place])    Diabetes mellitus without complication ([place])    HTN (hypertension)    Hypertension    Type II diabetes mellitus with renal manifestations ([place])     Medications:  NO AC prior to admission noted  Assessment: Patient admitted with shortness of breath. PMH includes diabetes, hypertension, and chronic kidney disease stage III. Pharmacy consulted to manage heparin infusion for new dx PE.  Baseline Hgb and plt appropriate to start heparin. No s/sx of bleeding noted.  CrCl = 28.26ml/min  *Baseline aPTT and PT/INR order*  Goal of Therapy:  Heparin level 0.3-0.7  units/ml Monitor platelets by anticoagulation protocol: Yes   Date Time HL Rate/Comment 1/27 1904 0.26 Subtherapeutic @ 700 un/hr 1/28 0405 0.41 therapeutic x 1 1/28 1131 0.26 Subtherapeutic,  inc from 800 to 900 u/hr  Plan:  Heparin infusion was briefly paused and switched to Eliquis, but given need for further workup, heparin consulted has been restarted. Discontinue Eliquis.  Give heparin IV 800 units bolus x1; then restart heparin infusion at 900 units/hr Check anti-Xa level in 8 hours and daily once consecutively therapeutic. Continue to monitor H&H and platelets daily while on heparin gtt.

## 2022-05-23 NOTE — ED Notes (Signed)
Advised nurse that patient has ready bed 

## 2022-05-23 NOTE — Consult Note (Signed)
ANTICOAGULATION CONSULT NOTE   Pharmacy Consult for heparin infusion Indication: pulmonary embolus  Allergies  Allergen Reactions   Ivp Dye [Iodinated Contrast Media]    Shellfish Allergy Other (See Comments)    Reaction: unknown     Patient Measurements: Height: 5\' 1"  (154.9 cm) Weight: 54.4 kg (120 lb) IBW/kg (Calculated) : 47.8 Heparin Dosing Weight: 54.4 kg  Vital Signs: Temp: 98.2 F (36.8 C) (01/28 0800) Temp Source: Oral (01/28 0800) BP: 144/79 (01/28 1100) Pulse Rate: 88 (01/28 1100)  Labs: Recent Labs    05/21/22 1620 05/21/22 1621 05/21/22 1621 05/21/22 2036 05/22/22 1038 05/22/22 1904 05/23/22 0405 05/23/22 1131  HGB  --  10.8*   < >  --   --  9.2* 9.2*  --   HCT  --  34.6*  --   --   --  29.4* 29.5*  --   PLT  --  502*  --   --   --  441* 438*  --   APTT  --   --   --   --  36  --   --   --   LABPROT  --   --   --   --  14.3  --   --   --   INR  --   --   --   --  1.1  --   --   --   HEPARINUNFRC  --   --   --   --   --  0.26* 0.41 0.26*  CREATININE  --  1.18*  --   --   --   --  0.83  --   TROPONINIHS <2  --   --  <2  --   --   --   --    < > = values in this interval not displayed.     Estimated Creatinine Clearance: 40.1 mL/min (by C-G formula based on SCr of 0.83 mg/dL).   Medical History: Past Medical History:  Diagnosis Date   Chronic kidney disease, stage 3a ([place])    Diabetes mellitus without complication ([place])    HTN (hypertension)    Hypertension    Type II diabetes mellitus with renal manifestations ([place])     Medications:  NO AC prior to admission noted  Assessment: Patient admitted with shortness of breath. PMH includes diabetes, hypertension, and chronic kidney disease stage III. Pharmacy consulted to manage heparin infusion for new dx PE.  Baseline Hgb and plt appropriate to start heparin. No s/sx of bleeding noted.  CrCl = 28.24ml/min  *Baseline aPTT and PT/INR order*  Goal of Therapy:  Heparin level 0.3-0.7  units/ml Monitor platelets by anticoagulation protocol: Yes  1/27 1904 HL 0.26, Subtherapeutic @ 700 un/hr 1/28 0405 HL 0.41, therapeutic x 1 1/28 1131 HL 0.26, subtherapeutic,  inc from 800 to 900 u/hr   Plan:  1/28 1131 HL 0.26, subtherapeutic,  --Bolus of 800 units x1 --Increase heparin drip rate to 900 units/hr --Recheck HL in 8 hrs to confirm --Daily CBC while on heparin drip  Chinita Greenland PharmD Clinical Pharmacist 05/23/2022

## 2022-05-23 NOTE — Progress Notes (Signed)
PROGRESS NOTE    Regina Perkins   MWN:[id number] DOB: 02/05/1941  DOA: 05/21/2022 Date of Service: 05/23/22 PCP: [facility]     Brief Narrative / Hospital Course:  Regina Perkins is a 82 y.o. female with medical history significant of hypertension, diabetes mellitus, PVD, CKD-3A, tobacco abuse, who presents to ED via EMS from home 05/21/22 with shortness breath x1 day.  01/26: in ED, oxygen saturation 86% on room air, which improved to 94% on 2 L oxygen. Pt reports IV contrast allergy, (+)D-dimer, CXR neg, Tropes neg, plan VQ scan 01/27: VQ done, high probability PE (Large segmental perfusion defect of the left upper lobe without corresponding radiographic abnormality. Additional bilateral wedge-shaped subsegmental branch perfusion defects"). [place] neg DVT. Admitted to hospitalist service on IV heparin and supplemental O2.  01/28: Echo no RV strain, (+)moderate pericardial effusion, LVH. Pt noting dyspnea, will keep overnight on O2 and remain on heparin   Consultants:  none  Procedures: none   ASSESSMENT & PLAN:   Principal Problem:   Acute pulmonary embolism ([place]) Active Problems:   Essential hypertension   Chronic kidney disease, stage 3a (HCC)   Type II diabetes mellitus with renal manifestations ([place])   Hypokalemia   Hypomagnesemia   Tobacco abuse  Acute pulmonary embolism (HCC) Acute hypoxic respiratory failure d/t PE 2D echocardiogram --> no R heart strain heparin drip  Supplemental O2 prn albuterol nebs and mucinex    Essential hypertension IV hydralazine as needed Amlodipine, lisinopril   Chronic kidney disease, stage 3a ([place]):  Renal function close to baseline.   Follow-up with BMP   Type II diabetes mellitus with renal manifestations ([place]):  no A1c available in epic.  Blood sugar 176.  Patient is taking metformin at home SSI Check A1c   Hypokalemia and hypomagnesemia on admission Potassium 3.3, magnesium 1.4 Repleted  potassium and magnesium Check  phosphorus level --> 3.0 Monitor labs    DVT prophylaxis: Eliquis Pertinent IV fluids/nutrition: no continuous IV fluids Central lines / invasive devices: none  Code Status: FULL CODE  Current Admission Status: inpatient   TOC needs / Dispo plan: none at this time, may need home O2 Barriers to discharge / significant pending items: dyspnea, O2 requirement, ancipitate improvement over next 1-2 days        Subjective / Brief ROS:  Patient reports feeling okay this morning other than SOB Denies CP/ Pain controlled.  Denies new weakness.  Tolerating diet.  Reports no concerns w/ urination/defecation.   Family Communication: spoke on phone w/ granddaughter 05/23/22 4:07 PM     Objective Findings:  Vitals:   05/23/22 0800 05/23/22 0900 05/23/22 1000 05/23/22 1100  BP: (!) 148/73 (!) 149/83 124/82 (!) 144/79  Pulse: 83 81  88  Resp:  (!) 22 (!) 23 (!) 22  Temp: 98.2 F (36.8 C)     TempSrc: Oral     SpO2: 100%     Weight:      Height:       No intake or output data in the 24 hours ending 05/23/22 1503 Filed Weights   05/22/22 1028  Weight: 54.4 kg    Examination:  Physical Exam Constitutional:      General: She is not in acute distress. Cardiovascular:     Rate and Rhythm: Normal rate and regular rhythm.  Pulmonary:     Effort: Pulmonary effort is normal. Tachypnea (becomes tachypneic on exertion) present.     Breath sounds: Normal breath sounds.  Musculoskeletal:        General: Normal range of motion.     Right lower leg: No edema.     Left lower leg: No edema.  Skin:    General: Skin is warm and dry.  Neurological:     General: No focal deficit present.     Mental Status: She is alert and oriented to person, place, and time.  Psychiatric:        Mood and Affect: Mood normal.        Behavior: Behavior normal.          Scheduled Medications:   amitriptyline  25 mg Oral q morning   amLODipine  10 mg Oral Daily    heparin  800 Units Intravenous Once   insulin aspart  0-5 Units Subcutaneous QHS   insulin aspart  0-9 Units Subcutaneous TID WC   lisinopril  40 mg Oral Daily   nicotine  21 mg Transdermal Daily    Continuous Infusions:   PRN Medications:  acetaminophen, albuterol, dextromethorphan-guaiFENesin, hydrALAZINE, ondansetron (ZOFRAN) IV  Antimicrobials from admission:  Anti-infectives (From admission, onward)    None           Data Reviewed:  I have personally reviewed the following...  CBC: Recent Labs  Lab 05/21/22 1621 05/22/22 1904 05/23/22 0405  WBC 10.6* 9.8 9.3  HGB 10.8* 9.2* 9.2*  HCT 34.6* 29.4* 29.5*  MCV 85.6 85.7 85.5  PLT 502* 441* 626*   Basic Metabolic Panel: Recent Labs  Lab 05/21/22 1621 05/22/22 1037 05/22/22 1038 05/23/22 0405  NA 136  --   --  136  K 3.3*  --   --  3.6  CL 93*  --   --  98  CO2 30  --   --  29  GLUCOSE 176*  --   --  102*  BUN 27*  --   --  17  CREATININE 1.18*  --   --  0.83  CALCIUM 9.5  --   --  8.7*  MG  --   --  1.4* 1.6*  PHOS  --  3.0  --   --    GFR: Estimated Creatinine Clearance: 40.1 mL/min (by C-G formula based on SCr of 0.83 mg/dL). Liver Function Tests: No results for input(s): "AST", "ALT", "ALKPHOS", "BILITOT", "PROT", "ALBUMIN" in the last 168 hours. No results for input(s): "LIPASE", "AMYLASE" in the last 168 hours. No results for input(s): "AMMONIA" in the last 168 hours. Coagulation Profile: Recent Labs  Lab 05/22/22 1038  INR 1.1   Cardiac Enzymes: No results for input(s): "CKTOTAL", "CKMB", "CKMBINDEX", "TROPONINI" in the last 168 hours. BNP (last 3 results) No results for input(s): "PROBNP" in the last 8760 hours. HbA1C: Recent Labs    05/22/22 1038  HGBA1C 7.1*   CBG: Recent Labs  Lab 05/22/22 1123 05/22/22 1606 05/22/22 2150 05/23/22 0742 05/23/22 1115  GLUCAP 160* 108* 112* 101* 226*   Lipid Profile: No results for input(s): "CHOL", "HDL", "LDLCALC", "TRIG",  "CHOLHDL", "LDLDIRECT" in the last 72 hours. Thyroid Function Tests: No results for input(s): "TSH", "T4TOTAL", "FREET4", "T3FREE", "THYROIDAB" in the last 72 hours. Anemia Panel: No results for input(s): "VITAMINB12", "FOLATE", "FERRITIN", "TIBC", "IRON", "RETICCTPCT" in the last 72 hours. Most Recent Urinalysis On File:     Component Value Date/Time   COLORURINE STRAW (A) 12/30/2015 1300   APPEARANCEUR CLEAR (A) 12/30/2015 1300   APPEARANCEUR Cloudy 01/16/2012 1937   LABSPEC 1.005 12/30/2015 1300   LABSPEC 1.028 01/16/2012  1937   PHURINE 7.0 12/30/2015 1300   GLUCOSEU >500 (A) 12/30/2015 1300   GLUCOSEU Negative 01/16/2012 1937   HGBUR 2+ (A) 12/30/2015 1300   BILIRUBINUR NEGATIVE 12/30/2015 1300   BILIRUBINUR Negative 01/16/2012 1937   KETONESUR 1+ (A) 12/30/2015 1300   PROTEINUR 30 (A) 12/30/2015 1300   NITRITE NEGATIVE 12/30/2015 1300   LEUKOCYTESUR NEGATIVE 12/30/2015 1300   LEUKOCYTESUR Negative 01/16/2012 1937   Sepsis Labs: @LABRCNTIP (procalcitonin:4,lacticidven:4) Microbiology: Recent Results (from the past 240 hour(s))  Resp Panel by RT-PCR (Flu A&B, Covid) Anterior Nasal Swab     Status: None   Collection Time: 05/21/22  8:52 PM   Specimen: Anterior Nasal Swab  Result Value Ref Range Status   SARS Coronavirus 2 by RT PCR NEGATIVE NEGATIVE Final    Comment: (NOTE) SARS-CoV-2 target nucleic acids are NOT DETECTED.  The SARS-CoV-2 RNA is generally detectable in upper respiratory specimens during the acute phase of infection. The lowest concentration of SARS-CoV-2 viral copies this assay can detect is 138 copies/mL. A negative result does not preclude SARS-Cov-2 infection and should not be used as the sole basis for treatment or other patient management decisions. A negative result may occur with  improper specimen collection/handling, submission of specimen other than nasopharyngeal swab, presence of viral mutation(s) within the areas targeted by this assay, and  inadequate number of viral copies(<138 copies/mL). A negative result must be combined with clinical observations, patient history, and epidemiological information. The expected result is Negative.  Fact Sheet for Patients:  EntrepreneurPulse.com.au  Fact Sheet for Healthcare Providers:  IncredibleEmployment.be  This test is no t yet approved or cleared by the [place] FDA and  has been authorized for detection and/or diagnosis of SARS-CoV-2 by FDA under an Emergency Use Authorization (EUA). This EUA will remain  in effect (meaning this test can be used) for the duration of the COVID-19 declaration under Section 564(b)(1) of the Act, 21 U.S.C.section 360bbb-3(b)(1), unless the authorization is terminated  or revoked sooner.       Influenza A by PCR NEGATIVE NEGATIVE Final   Influenza B by PCR NEGATIVE NEGATIVE Final    Comment: (NOTE) The Xpert Xpress SARS-CoV-2/FLU/RSV plus assay is intended as an aid in the diagnosis of influenza from Nasopharyngeal swab specimens and should not be used as a sole basis for treatment. Nasal washings and aspirates are unacceptable for Xpert Xpress SARS-CoV-2/FLU/RSV testing.  Fact Sheet for Patients: EntrepreneurPulse.com.au  Fact Sheet for Healthcare Providers: IncredibleEmployment.be  This test is not yet approved or cleared by the [place] FDA and has been authorized for detection and/or diagnosis of SARS-CoV-2 by FDA under an Emergency Use Authorization (EUA). This EUA will remain in effect (meaning this test can be used) for the duration of the COVID-19 declaration under Section 564(b)(1) of the Act, 21 U.S.C. section 360bbb-3(b)(1), unless the authorization is terminated or revoked.  Performed at [facility], [address] [place], [place] [address]       Radiology Studies last 3 days: ECHOCARDIOGRAM COMPLETE  Result Date: 05/23/2022     ECHOCARDIOGRAM REPORT   Patient Name:   KEYSHA DAMEWOOD Date of Exam: 05/23/2022 Medical Rec #:  [id number]      Height:       61.0 in Accession #:    [id number]     Weight:       120.0 lb Date of Birth:  28-Nov-1940      BSA:          1.520 m  Patient Age:    70 years       BP:           148/77 mmHg Patient Gender: F              HR:           90 bpm. Exam Location:  [facility] Procedure: 2D Echo and Strain Analysis Indications:     Pulmonary Embolus I26.09  History:         Patient has no prior history of Echocardiogram examinations.  Sonographer:     Kathlen Brunswick RDCS Referring Phys:  [id number] Soledad Gerlach NIU Diagnosing Phys: Fransico Him MD  Sonographer Comments: Global longitudinal strain was attempted. IMPRESSIONS  1. Left ventricular ejection fraction, by estimation, is 60 to 65%. The left ventricle has normal function. The left ventricle has no regional wall motion abnormalities. There is severe concentric left ventricular hypertrophy. Left ventricular diastolic  parameters are consistent with Grade I diastolic dysfunction (impaired relaxation).  2. Right ventricular systolic function is normal. The right ventricular size is normal. There is normal pulmonary artery systolic pressure. The estimated right ventricular systolic pressure is 00.7 mmHg.  3. Moderate pericardial effusion. The pericardial effusion is circumferential, localized near the right atrium and anterior to the right ventricle. There is no evidence of cardiac tamponade.  4. The mitral valve is normal in structure. No evidence of mitral valve regurgitation. No evidence of mitral stenosis.  5. The aortic valve is normal in structure. Aortic valve regurgitation is mild. No aortic stenosis is present.  6. Aortic dilatation noted. There is mild dilatation of the aortic root, measuring 40 mm.  7. The inferior vena cava is normal in size with greater than 50% respiratory variability, suggesting right atrial pressure of 3 mmHg. FINDINGS  Left Ventricle: Left  ventricular ejection fraction, by estimation, is 60 to 65%. The left ventricle has normal function. The left ventricle has no regional wall motion abnormalities. Global longitudinal strain performed but not reported based on interpreter judgement due to suboptimal tracking. The left ventricular internal cavity size was normal in size. There is severe concentric left ventricular hypertrophy. Left ventricular diastolic parameters are consistent with Grade I diastolic dysfunction (impaired relaxation). Normal left ventricular filling pressure. Right Ventricle: The right ventricular size is normal. No increase in right ventricular wall thickness. Right ventricular systolic function is normal. There is normal pulmonary artery systolic pressure. The tricuspid regurgitant velocity is 2.60 m/s, and  with an assumed right atrial pressure of 3 mmHg, the estimated right ventricular systolic pressure is 62.2 mmHg. Left Atrium: Left atrial size was normal in size. Right Atrium: Right atrial size was normal in size. Pericardium: A moderately sized pericardial effusion is present. The pericardial effusion is circumferential, localized near the right atrium and anterior to the right ventricle. There is no evidence of cardiac tamponade. Mitral Valve: The mitral valve is normal in structure. No evidence of mitral valve regurgitation. No evidence of mitral valve stenosis. Tricuspid Valve: The tricuspid valve is normal in structure. Tricuspid valve regurgitation is trivial. No evidence of tricuspid stenosis. Aortic Valve: The aortic valve is normal in structure. Aortic valve regurgitation is mild. Aortic regurgitation PHT measures 508 msec. No aortic stenosis is present. Aortic valve peak gradient measures 5.7 mmHg. Pulmonic Valve: The pulmonic valve was normal in structure. Pulmonic valve regurgitation is trivial. No evidence of pulmonic stenosis. Aorta: Aortic dilatation noted. There is mild dilatation of the aortic root, measuring  40 mm. Venous: The inferior vena cava  is normal in size with greater than 50% respiratory variability, suggesting right atrial pressure of 3 mmHg. IAS/Shunts: No atrial level shunt detected by color flow Doppler.  LEFT VENTRICLE PLAX 2D LVIDd:         3.30 cm   Diastology LVIDs:         2.30 cm   LV e' medial:    8.16 cm/s LV PW:         1.70 cm   LV E/e' medial:  8.2 LV IVS:        1.70 cm   LV e' lateral:   9.14 cm/s LVOT diam:     1.80 cm   LV E/e' lateral: 7.3 LV SV:         50 LV SV Index:   33 LVOT Area:     2.54 cm  RIGHT VENTRICLE RV Basal diam:  2.20 cm RV S prime:     14.10 cm/s TAPSE (M-mode): 1.8 cm LEFT ATRIUM           Index        RIGHT ATRIUM          Index LA diam:      2.70 cm 1.78 cm/m   RA Area:     6.99 cm LA Vol (A2C): 14.1 ml 9.28 ml/m   RA Volume:   12.80 ml 8.42 ml/m LA Vol (A4C): 40.0 ml 26.31 ml/m  AORTIC VALVE                 PULMONIC VALVE AV Area (Vmax): 2.33 cm     PV Vmax:       1.09 m/s AV Vmax:        119.00 cm/s  PV Peak grad:  4.8 mmHg AV Peak Grad:   5.7 mmHg LVOT Vmax:      109.00 cm/s LVOT Vmean:     72.800 cm/s LVOT VTI:       0.196 m AI PHT:         508 msec  AORTA Ao Root diam: 4.00 cm Ao Asc diam:  3.40 cm MITRAL VALVE                TRICUSPID VALVE MV Area (PHT): 4.49 cm     TR Peak grad:   27.0 mmHg MV Decel Time: 169 msec     TR Vmax:        260.00 cm/s MV E velocity: 67.10 cm/s MV A velocity: 106.00 cm/s  SHUNTS MV E/A ratio:  0.63         Systemic VTI:  0.20 m                             Systemic Diam: 1.80 cm Fransico Him MD Electronically signed by Fransico Him MD Signature Date/Time: 05/23/2022/1:38:42 PM    Final    NM Pulmonary Perfusion  Result Date: 05/22/2022 CLINICAL DATA:  Pulmonary embolism (PE) suspected, high prob EXAM: NUCLEAR MEDICINE PERFUSION LUNG SCAN TECHNIQUE: Perfusion images were obtained in multiple projections after intravenous injection of radiopharmaceutical. Ventilation scans intentionally deferred if perfusion scan and chest x-ray  adequate for interpretation during COVID 19 epidemic. RADIOPHARMACEUTICALS:  4.37 mCi Tc-62m MAA IV COMPARISON:  Chest x-ray 05/21/2022 FINDINGS: Large segmental perfusion defect of the left upper lobe without corresponding radiographic abnormality. Additional bilateral wedge-shaped subsegmental branch perfusion defects. IMPRESSION: High probability for pulmonary embolism. Electronically Signed   By: Davina Poke D.O.  On: 05/22/2022 10:01   US Venous Img Lower Bilateral  Result Date: 05/21/2022 CLINICAL DATA:  Shortness of breath EXAM: BILATERAL LOWER EXTREMITY VENOUS DOPPLER ULTRASOUND TECHNIQUE: Gray-scale sonography with graded compression, as well as color Doppler and duplex ultrasound were performed to evaluate the lower extremity deep venous systems from the level of the common femoral vein and including the common femoral, femoral, profunda femoral, popliteal and calf veins including the posterior tibial, peroneal and gastrocnemius veins when visible. The superficial great saphenous vein was also interrogated. Spectral Doppler was utilized to evaluate flow at rest and with distal augmentation maneuvers in the common femoral, femoral and popliteal veins. COMPARISON:  None Available. FINDINGS: RIGHT LOWER EXTREMITY Common Femoral Vein: No evidence of thrombus. Normal compressibility, respiratory phasicity and response to augmentation. Saphenofemoral Junction: No evidence of thrombus. Normal compressibility and flow on color Doppler imaging. Profunda Femoral Vein: No evidence of thrombus. Normal compressibility and flow on color Doppler imaging. Femoral Vein: No evidence of thrombus. Normal compressibility, respiratory phasicity and response to augmentation. Popliteal Vein: No evidence of thrombus. Normal compressibility, respiratory phasicity and response to augmentation. Calf Veins: No evidence of thrombus. Normal compressibility and flow on color Doppler imaging. Superficial Great Saphenous Vein: No  evidence of thrombus. Normal compressibility. Venous Reflux:  None. Other Findings:  None. LEFT LOWER EXTREMITY Common Femoral Vein: No evidence of thrombus. Normal compressibility, respiratory phasicity and response to augmentation. Saphenofemoral Junction: No evidence of thrombus. Normal compressibility and flow on color Doppler imaging. Profunda Femoral Vein: No evidence of thrombus. Normal compressibility and flow on color Doppler imaging. Femoral Vein: No evidence of thrombus. Normal compressibility, respiratory phasicity and response to augmentation. Popliteal Vein: No evidence of thrombus. Normal compressibility, respiratory phasicity and response to augmentation. Calf Veins: No evidence of thrombus. Normal compressibility and flow on color Doppler imaging. Superficial Great Saphenous Vein: No evidence of thrombus. Normal compressibility. Venous Reflux:  None. Other Findings:  None. IMPRESSION: No evidence of deep venous thrombosis in either lower extremity. Electronically Signed   By: Inez Catalina M.D.   On: 05/21/2022 22:37   DG Chest 2 View  Result Date: 05/21/2022 CLINICAL DATA:  sob EXAM: CHEST - 2 VIEW COMPARISON:  December 30, 2015, February 26, 2014 FINDINGS: Evaluation is limited by rotation. The cardiomediastinal silhouette is unchanged and enlarged in contour. Tortuous thoracic aorta. No pleural effusion. No pneumothorax. No acute pleuroparenchymal abnormality. Visualized abdomen is unremarkable. Multilevel degenerative changes of the thoracic spine. IMPRESSION: No acute cardiopulmonary abnormality. Electronically Signed   By: Valentino Saxon M.D.   On: 05/21/2022 16:40             LOS: 1 day      Emeterio Reeve, DO [facility] 05/23/2022, 3:03 PM    Dictation software may have been used to generate the above note. Typos may occur and escape review in typed/dictated notes. Please contact Dr Sheppard Coil directly for clarity if needed.  Staff may message me via  secure chat in [place]  but this may not receive an immediate response,  please page me for urgent matters!  If 7PM-7AM, please contact night coverage [email or web address]

## 2022-05-23 NOTE — ED Notes (Signed)
Pt ambulated halfway down hall during shift on room air. Oxygen sats dropped to 93%. Pt did present with Tachypnea and Hear Rate 125.  MD Notified. Orders received. Will continue plan of care.

## 2022-05-23 NOTE — Consult Note (Signed)
ANTICOAGULATION CONSULT NOTE   Pharmacy Consult for heparin infusion Indication: pulmonary embolus  Allergies  Allergen Reactions   Ivp Dye [Iodinated Contrast Media]    Shellfish Allergy Other (See Comments)    Reaction: unknown     Patient Measurements: Height: 5\' 1"  (154.9 cm) Weight: 54.4 kg (120 lb) IBW/kg (Calculated) : 47.8 Heparin Dosing Weight: 54.4 kg  Vital Signs: Temp: 98.4 F (36.9 C) (01/28 0400) Temp Source: Oral (01/28 0400) BP: 117/85 (01/28 0400) Pulse Rate: 87 (01/28 0400)  Labs: Recent Labs    05/21/22 1620 05/21/22 1621 05/21/22 1621 05/21/22 2036 05/22/22 1038 05/22/22 1904 05/23/22 0405  HGB  --  10.8*   < >  --   --  9.2* 9.2*  HCT  --  34.6*  --   --   --  29.4* 29.5*  PLT  --  502*  --   --   --  441* 438*  APTT  --   --   --   --  36  --   --   LABPROT  --   --   --   --  14.3  --   --   INR  --   --   --   --  1.1  --   --   HEPARINUNFRC  --   --   --   --   --  0.26* 0.41  CREATININE  --  1.18*  --   --   --   --   --   TROPONINIHS <2  --   --  <2  --   --   --    < > = values in this interval not displayed.     Estimated Creatinine Clearance: 28.2 mL/min (A) (by C-G formula based on SCr of 1.18 mg/dL (H)).   Medical History: Past Medical History:  Diagnosis Date   Chronic kidney disease, stage 3a ([place])    Diabetes mellitus without complication ([place])    HTN (hypertension)    Hypertension    Type II diabetes mellitus with renal manifestations ([place])     Medications:  NO AC prior to admission noted  Assessment: Patient admitted with shortness of breath. PMH includes diabetes, hypertension, and chronic kidney disease stage III. Pharmacy consulted to manage heparin infusion for new dx PE.  Baseline Hgb and plt appropriate to start heparin. No s/sx of bleeding noted.  CrCl = 28.97ml/min  *Baseline aPTT and PT/INR order*  Goal of Therapy:  Heparin level 0.3-0.7 units/ml Monitor platelets by anticoagulation protocol:  Yes  1/27 1904 HL 0.26, Subtherapeutic @ 700 un/hr 1/28 0405 HL 0.41, therapeutic x 1   Plan:  --Continue heparin drip rate at 800 units/hr --Recheck HL in 8 hrs to confirm --Daily CBC while on heparin drip  Renda Rolls, PharmD, [facility] 05/23/2022 4:25 AM

## 2022-05-23 NOTE — Progress Notes (Signed)
  Echocardiogram 2D Echocardiogram has been performed.  Claretta Fraise 05/23/2022, 8:24 AM

## 2022-05-23 NOTE — Hospital Course (Signed)
Regina Perkins is a 82 y.o. female with medical history significant of hypertension, diabetes mellitus, PVD, CKD-3A, tobacco abuse, who presents to ED via EMS from home 05/21/22 with shortness breath x1 day.  01/26: in ED, oxygen saturation 86% on room air, which improved to 94% on 2 L oxygen. Pt reports IV contrast allergy, (+)D-dimer, CXR neg, Tropes neg, plan VQ scan 01/27: VQ done, high probability PE (Large segmental perfusion defect of the left upper lobe without corresponding radiographic abnormality. Additional bilateral wedge-shaped subsegmental branch perfusion defects"). [place] neg DVT. Admitted to hospitalist service on IV heparin and supplemental O2.  01/28: Echo pending this AM  Consultants:  ***  Procedures: ***      ASSESSMENT & PLAN:   Principal Problem:   Acute pulmonary embolism ([place]) Active Problems:   Essential hypertension   Chronic kidney disease, stage 3a ([place])   Type II diabetes mellitus with renal manifestations ([place])   Hypokalemia   Hypomagnesemia   Tobacco abuse  Acute pulmonary embolism (HCC) Acute hypoxic respiratory failure d/t PE heparin drip  BNP prn albuterol nebs and mucinex  2D echocardiogram ordered --> if pt has right heart straining, will consult vascular surgeon --> ***   Essential hypertension IV hydralazine as needed Amlodipine, lisinopril   Chronic kidney disease, stage 3a ([place]):  Renal function close to baseline.   Follow-up with BMP   Type II diabetes mellitus with renal manifestations ([place]):  no A1c available in epic.  Blood sugar 176.  Patient is taking metformin at home SSI Check A1c   Hypokalemia and hypomagnesemia on admission Potassium 3.3, magnesium 1.4 Repleted potassium and magnesium Check  phosphorus level --> 3.0 Monitor labs        DVT prophylaxis: *** Pertinent IV fluids/nutrition: *** Central lines / invasive devices: ***  Code Status: ***  Current Admission Status: ***  TOC needs / Dispo plan:  *** Barriers to discharge / significant pending items: ***

## 2022-05-23 NOTE — TOC Benefit Eligibility Note (Signed)
Copay Check: Apixaban (Eliquis)   Patient eligible-  copay $47 per month  [name], PharmD, BCPS Clinical Pharmacist 05/23/2022 10:04 AM

## 2022-05-23 NOTE — ED Notes (Signed)
Nt [employer] assisted pt to use bedside commode. Pt had a bm. Returned to bed, purewick in place.

## 2022-05-24 ENCOUNTER — Inpatient Hospital Stay: Payer: Medicare Other

## 2022-05-24 ENCOUNTER — Encounter: Payer: Self-pay | Admitting: Internal Medicine

## 2022-05-24 ENCOUNTER — Other Ambulatory Visit (HOSPITAL_COMMUNITY): Payer: Self-pay

## 2022-05-24 DIAGNOSIS — N1831 Chronic kidney disease, stage 3a: Secondary | ICD-10-CM | POA: Diagnosis not present

## 2022-05-24 DIAGNOSIS — E1129 Type 2 diabetes mellitus with other diabetic kidney complication: Secondary | ICD-10-CM | POA: Diagnosis not present

## 2022-05-24 DIAGNOSIS — I2699 Other pulmonary embolism without acute cor pulmonale: Secondary | ICD-10-CM | POA: Diagnosis not present

## 2022-05-24 DIAGNOSIS — I10 Essential (primary) hypertension: Secondary | ICD-10-CM | POA: Diagnosis not present

## 2022-05-24 LAB — CBC
HCT: 33.8 % — ABNORMAL LOW (ref 36.0–46.0)
Hemoglobin: 10.6 g/dL — ABNORMAL LOW (ref 12.0–15.0)
MCH: 27 pg (ref 26.0–34.0)
MCHC: 31.4 g/dL (ref 30.0–36.0)
MCV: 86.2 fL (ref 80.0–100.0)
Platelets: 471 10*3/uL — ABNORMAL HIGH (ref 150–400)
RBC: 3.92 MIL/uL (ref 3.87–5.11)
RDW: 15.9 % — ABNORMAL HIGH (ref 11.5–15.5)
WBC: 9.7 10*3/uL (ref 4.0–10.5)
nRBC: 0 % (ref 0.0–0.2)

## 2022-05-24 LAB — HEPARIN LEVEL (UNFRACTIONATED)
Heparin Unfractionated: 0.34 IU/mL (ref 0.30–0.70)
Heparin Unfractionated: 0.27 IU/mL — ABNORMAL LOW (ref 0.30–0.70)
Heparin Unfractionated: 0.42 IU/mL (ref 0.30–0.70)

## 2022-05-24 LAB — BASIC METABOLIC PANEL
Anion gap: 10 (ref 5–15)
BUN: 13 mg/dL (ref 8–23)
CO2: 30 mmol/L (ref 22–32)
Calcium: 9 mg/dL (ref 8.9–10.3)
Chloride: 99 mmol/L (ref 98–111)
Creatinine, Ser: 0.98 mg/dL (ref 0.44–1.00)
GFR, Estimated: 58 mL/min — ABNORMAL LOW (ref >60)
Glucose, Bld: 118 mg/dL — ABNORMAL HIGH (ref 70–99)
Potassium: 4.1 mmol/L (ref 3.5–5.1)
Sodium: 139 mmol/L (ref 135–145)

## 2022-05-24 LAB — GLUCOSE, CAPILLARY
Glucose-Capillary: 114 mg/dL — ABNORMAL HIGH (ref 70–99)
Glucose-Capillary: 192 mg/dL — ABNORMAL HIGH (ref 70–99)
Glucose-Capillary: 158 mg/dL — ABNORMAL HIGH (ref 70–99)
Glucose-Capillary: 186 mg/dL — ABNORMAL HIGH (ref 70–99)

## 2022-05-24 MED ORDER — METHYLPREDNISOLONE SODIUM SUCC 40 MG IJ SOLR
40.0000 mg | Freq: Once | INTRAMUSCULAR | Status: AC
  Administered 2022-05-24: 40 mg via INTRAVENOUS
  Filled 2022-05-24: qty 1

## 2022-05-24 MED ORDER — METHYLPREDNISOLONE SODIUM SUCC 40 MG IJ SOLR: 40.0000 mg | Freq: Once | INTRAMUSCULAR | Status: DC

## 2022-05-24 MED ORDER — DIPHENHYDRAMINE HCL 25 MG PO CAPS
50.0000 mg | ORAL_CAPSULE | Freq: Once | ORAL | Status: AC
  Administered 2022-05-24: 50 mg via ORAL
  Filled 2022-05-24: qty 2

## 2022-05-24 MED ORDER — IOHEXOL 350 MG/ML SOLN
75.0000 mL | Freq: Once | INTRAVENOUS | Status: AC | PRN
  Administered 2022-05-24: 75 mL via INTRAVENOUS

## 2022-05-24 MED ORDER — HEPARIN BOLUS VIA INFUSION
800.0000 [IU] | Freq: Once | INTRAVENOUS | Status: AC
  Administered 2022-05-24: 800 [IU] via INTRAVENOUS
  Filled 2022-05-24: qty 800

## 2022-05-24 MED ORDER — DIPHENHYDRAMINE HCL 50 MG/ML IJ SOLN: 50.0000 mg | Freq: Once | INTRAMUSCULAR | Status: AC

## 2022-05-24 NOTE — Consult Note (Signed)
ANTICOAGULATION CONSULT NOTE   Pharmacy Consult for heparin infusion Indication: pulmonary embolus  Allergies  Allergen Reactions   Ivp Dye [Iodinated Contrast Media]    Shellfish Allergy Other (See Comments)    Reaction: unknown     Patient Measurements: Height: 5\' 1"  (154.9 cm) Weight: 54.4 kg (120 lb) IBW/kg (Calculated) : 47.8 Heparin Dosing Weight: 54.4 kg  Vital Signs: Temp: 98.2 F (36.8 C) (01/28 2306) Temp Source: Oral (01/28 1500) BP: 140/69 (01/28 2306) Pulse Rate: 87 (01/28 2306)  Labs: Recent Labs    05/21/22 1620 05/21/22 1621 05/21/22 1621 05/21/22 2036 05/22/22 1038 05/22/22 1904 05/23/22 0405 05/23/22 1131 05/24/22 0041  HGB  --  10.8*   < >  --   --  9.2* 9.2*  --   --   HCT  --  34.6*  --   --   --  29.4* 29.5*  --   --   PLT  --  502*  --   --   --  441* 438*  --   --   APTT  --   --   --   --  36  --   --   --   --   LABPROT  --   --   --   --  14.3  --   --   --   --   INR  --   --   --   --  1.1  --   --   --   --   HEPARINUNFRC  --   --    < >  --   --  0.26* 0.41 0.26* 0.34  CREATININE  --  1.18*  --   --   --   --  0.83  --   --   TROPONINIHS <2  --   --  <2  --   --   --   --   --    < > = values in this interval not displayed.     Estimated Creatinine Clearance: 40.1 mL/min (by C-G formula based on SCr of 0.83 mg/dL).   Medical History: Past Medical History:  Diagnosis Date   Chronic kidney disease, stage 3a ([place])    Diabetes mellitus without complication ([place])    HTN (hypertension)    Hypertension    Type II diabetes mellitus with renal manifestations ([place])     Medications:  NO AC prior to admission noted  Assessment: Patient admitted with shortness of breath. PMH includes diabetes, hypertension, and chronic kidney disease stage III. Pharmacy consulted to manage heparin infusion for new dx PE.  Baseline Hgb and plt appropriate to start heparin. No s/sx of bleeding noted.  CrCl = 28.91ml/min  *Baseline aPTT and PT/INR  order*  Goal of Therapy:  Heparin level 0.3-0.7 units/ml Monitor platelets by anticoagulation protocol: Yes   Date Time HL Rate/Comment 1/27 1904 0.26 Subtherapeutic @ 700 un/hr 1/28 0405 0.41 therapeutic x 1 1/28 1131 0.26 Subtherapeutic,  inc from 800 to 900 u/hr 1/29 0041 0.34 Therapeutic x 1  Plan:  Continue heparin infusion at 900 units/hr Recheck Hl in 8 hr to confirm. Continue to monitor H&H and platelets daily while on heparin gtt.

## 2022-05-24 NOTE — Consult Note (Signed)
Pharmacy Antibiotic Note  Juleah L Supan is a 82 y.o. female admitted on 05/21/2022 with pneumonia.  Pharmacy has been consulted for cefepime dosing.  Plan: Cefepime 2g IV every 12 hours Continue to monitor and dose adjust antibiotics according to renal function and indication   Height: 5\' 1"  (154.9 cm) Weight: 54.4 kg (120 lb) IBW/kg (Calculated) : 47.8  Temp (24hrs), Avg:98 F (36.7 C), Min:97.7 F (36.5 C), Max:98.3 F (36.8 C)  Recent Labs  Lab 05/21/22 1621 05/22/22 1904 05/23/22 0405 05/24/22 0538  WBC 10.6* 9.8 9.3 9.7  CREATININE 1.18*  --  0.83 0.98    Estimated Creatinine Clearance: 34 mL/min (by C-G formula based on SCr of 0.98 mg/dL).    Allergies  Allergen Reactions   Ivp Dye [Iodinated Contrast Media]    Shellfish Allergy Other (See Comments)    Reaction: unknown     Antimicrobials this admission: 1/29 cefepime >>    Microbiology results: 1/29 MRSA PCR: sent  Thank you for allowing pharmacy to be a part of this patient's care.  Darrick Penna 05/24/2022 9:33 PM

## 2022-05-24 NOTE — TOC Benefit Eligibility Note (Signed)
Patient Advocate Encounter  Insurance verification completed.    The patient is currently admitted and upon discharge could be taking Eliquis 5 mg.  The current 30 day co-pay is $47.00.   The patient is insured through [employer] Medicare Part D   Polk Minor, CPHT Pharmacy Patient Advocate Specialist [facility] Patient Advocate Team Direct Number: [phone]  Fax: [phone]       

## 2022-05-24 NOTE — Progress Notes (Incomplete)
CROSS COVER NOTE  NAME: Regina Perkins MRN: [id number] DOB : [birth year] ATTENDING PHYSICIAN: Emeterio Reeve, DO    Date of Service   05/24/2022   HPI/Events of Note   CTA negative for PE or hemorrhage. Shows (L) upper lobe opacity concerning for infection.  Interventions   Assessment/Plan:  (L) Upper Lobe Pneumonia Cefepime per pharmacy X X    *** professional thanks      To reach the provider On-Call:   7AM- 7PM see care teams to locate the attending and reach out to them via [email or web address] Password: [email or web address] 7PM-7AM contact night-coverage If you still have difficulty reaching the appropriate provider, please page the [facility] (Director on Call) for [facility] on amion for assistance  This document was prepared using Systems analyst and may include unintentional dictation errors.  Neomia Glass DNP, MBA, FNP-BC, PMHNP-BC Nurse Practitioner [facility] Pager [phone]

## 2022-05-24 NOTE — Progress Notes (Signed)
PROGRESS NOTE    CHAUNTELLE AZPEITIA   PPI:[id number] DOB: October 16, 1940  DOA: 05/21/2022 Date of Service: 05/24/22 PCP: [facility] [place]     Brief Narrative / Hospital Course:  SHERRILYNN GUDGEL is a 82 y.o. female with medical history significant of hypertension, diabetes mellitus, PVD, CKD-3A, tobacco abuse, who presents to ED via EMS from home 05/21/22 with shortness breath x1 day.  01/26: in ED, oxygen saturation 86% on room air, which improved to 94% on 2 L oxygen. Pt reports IV contrast allergy, (+)D-dimer, CXR neg, Tropes neg, plan VQ scan 01/27: VQ done, high probability PE (Large segmental perfusion defect of the left upper lobe without corresponding radiographic abnormality. Additional bilateral wedge-shaped subsegmental branch perfusion defects"). [place] neg DVT. Admitted to hospitalist service on IV heparin and supplemental O2.  01/28: Echo no RV strain, (+)moderate pericardial effusion, LVH. Pt noting dyspnea, will keep overnight on O2 and remain on heparin  01/29: Normal SpO2 but significant increased WOB and SOB with minimal exertion.  Kept on heparin.  CXR concerning for LLL opacity and noted potential for pulmonary hemorrhage. D/w patient and with vascular surgery, Dr. Lucky Cowboy, we will move forward with CTA chest, premedicating for contrast allergy.  Signed this out to [facility] night team  Consultants:  none  Procedures: none   ASSESSMENT & PLAN:   Principal Problem:   Acute pulmonary embolism ([place]) Active Problems:   Essential hypertension   Chronic kidney disease, stage 3a ([place])   Type II diabetes mellitus with renal manifestations ([place])   Hypokalemia   Hypomagnesemia   Tobacco abuse  Acute pulmonary embolism (HCC) Acute hypoxic respiratory failure d/t PE - improved, to RA 2D echocardiogram --> no R heart strain heparin drip  Supplemental O2 as needed prn albuterol nebs and mucinex   SOB ABN CXR Ddx: extensive clot not well-characterized on VQ scan,  pulmonary hemorrhage (if present hopefully is mild given normal CBC), pulmonary infarct, infectious process Kept on heparin for now Pending CTA chest, premedicating for contrast allergy.  Signed this out to [facility] night team to follow results.  If pulmonary hemorrhage (seems unlikely but possible) may need hold anticoag and transfer for CT surgery / attempt embolize bleed If extensive clot, may need thrombectomy   Essential hypertension IV hydralazine as needed Amlodipine, lisinopril   Chronic kidney disease, stage 3a ([place]):  Renal function close to baseline.   Follow-up with BMP   Type II diabetes mellitus with renal manifestations ([place]):  no A1c available in epic.  Blood sugar 176.  Patient is taking metformin at home SSI Check A1c   Hypokalemia and hypomagnesemia on admission Potassium 3.3, magnesium 1.4 Repleted potassium and magnesium Check  phosphorus level --> 3.0 Monitor labs    DVT prophylaxis: Eliquis Pertinent IV fluids/nutrition: no continuous IV fluids Central lines / invasive devices: none  Code Status: FULL CODE  Current Admission Status: inpatient   TOC needs / Dispo plan: none at this time, may need home O2 Barriers to discharge / significant pending items: dyspnea, pending CT chest        Subjective / Brief ROS:  Patient reports feeling okay this morning other than SOB Denies CP Pain controlled.  Denies new weakness.  Tolerating diet.  Reports no concerns w/ urination/defecation.   Family Communication: Attempted to reach granddaughter, left message.    Objective Findings:  Vitals:   05/23/22 2306 05/24/22 0612 05/24/22 0820 05/24/22 1652  BP: (!) 140/69 (!) 146/79 (!) 165/86 (!) 136/94  Pulse:  87 95 98 (!) 102  Resp: 20 18 16 16   Temp: 98.2 F (36.8 C) 98.1 F (36.7 C) 97.7 F (36.5 C) 98.3 F (36.8 C)  TempSrc:      SpO2: 96% 98% 97% 100%  Weight:      Height:        Intake/Output Summary (Last 24 hours) at 05/24/2022 1851 Last  data filed at 05/24/2022 0402 Gross per 24 hour  Intake 104.61 ml  Output --  Net 104.61 ml   Filed Weights   05/22/22 1028  Weight: 54.4 kg    Examination:  Physical Exam Constitutional:      General: She is not in acute distress. Cardiovascular:     Rate and Rhythm: Normal rate and regular rhythm.  Pulmonary:     Effort: Pulmonary effort is normal. Tachypnea (becomes tachypneic on exertion) present.     Breath sounds: Examination of the left-middle field reveals decreased breath sounds. Examination of the left-lower field reveals decreased breath sounds. Decreased breath sounds present.  Musculoskeletal:        General: Normal range of motion.     Right lower leg: No edema.     Left lower leg: No edema.  Skin:    General: Skin is warm and dry.  Neurological:     General: No focal deficit present.     Mental Status: She is alert and oriented to person, place, and time.  Psychiatric:        Mood and Affect: Mood normal.        Behavior: Behavior normal.          Scheduled Medications:   amitriptyline  25 mg Oral q morning   amLODipine  10 mg Oral Daily   insulin aspart  0-5 Units Subcutaneous QHS   insulin aspart  0-9 Units Subcutaneous TID WC   lisinopril  40 mg Oral Daily   nicotine  21 mg Transdermal Daily    Continuous Infusions:  heparin 1,000 Units/hr (05/24/22 1815)    PRN Medications:  acetaminophen, albuterol, dextromethorphan-guaiFENesin, hydrALAZINE, ondansetron (ZOFRAN) IV  Antimicrobials from admission:  Anti-infectives (From admission, onward)    None           Data Reviewed:  I have personally reviewed the following...  CBC: Recent Labs  Lab 05/21/22 1621 05/22/22 1904 05/23/22 0405 05/24/22 0538  WBC 10.6* 9.8 9.3 9.7  HGB 10.8* 9.2* 9.2* 10.6*  HCT 34.6* 29.4* 29.5* 33.8*  MCV 85.6 85.7 85.5 86.2  PLT 502* 441* 438* 471*    Basic Metabolic Panel: Recent Labs  Lab 05/21/22 1621 05/22/22 1037 05/22/22 1038  05/23/22 0405 05/24/22 0538  NA 136  --   --  136 139  K 3.3*  --   --  3.6 4.1  CL 93*  --   --  98 99  CO2 30  --   --  29 30  GLUCOSE 176*  --   --  102* 118*  BUN 27*  --   --  17 13  CREATININE 1.18*  --   --  0.83 0.98  CALCIUM 9.5  --   --  8.7* 9.0  MG  --   --  1.4* 1.6*  --   PHOS  --  3.0  --   --   --     GFR: Estimated Creatinine Clearance: 34 mL/min (by C-G formula based on SCr of 0.98 mg/dL). Liver Function Tests: No results for input(s): "AST", "ALT", "ALKPHOS", "BILITOT", "PROT", "  ALBUMIN" in the last 168 hours. No results for input(s): "LIPASE", "AMYLASE" in the last 168 hours. No results for input(s): "AMMONIA" in the last 168 hours. Coagulation Profile: Recent Labs  Lab 05/22/22 1038  INR 1.1    Cardiac Enzymes: No results for input(s): "CKTOTAL", "CKMB", "CKMBINDEX", "TROPONINI" in the last 168 hours. BNP (last 3 results) No results for input(s): "PROBNP" in the last 8760 hours. HbA1C: Recent Labs    05/22/22 1038  HGBA1C 7.1*    CBG: Recent Labs  Lab 05/23/22 1616 05/23/22 2026 05/24/22 0822 05/24/22 1158 05/24/22 1654  GLUCAP 82 149* 114* 192* 158*    Lipid Profile: No results for input(s): "CHOL", "HDL", "LDLCALC", "TRIG", "CHOLHDL", "LDLDIRECT" in the last 72 hours. Thyroid Function Tests: No results for input(s): "TSH", "T4TOTAL", "FREET4", "T3FREE", "THYROIDAB" in the last 72 hours. Anemia Panel: No results for input(s): "VITAMINB12", "FOLATE", "FERRITIN", "TIBC", "IRON", "RETICCTPCT" in the last 72 hours. Most Recent Urinalysis On File:     Component Value Date/Time   COLORURINE STRAW (A) 12/30/2015 1300   APPEARANCEUR CLEAR (A) 12/30/2015 1300   APPEARANCEUR Cloudy 01/16/2012 1937   LABSPEC 1.005 12/30/2015 1300   LABSPEC 1.028 01/16/2012 1937   PHURINE 7.0 12/30/2015 1300   GLUCOSEU >500 (A) 12/30/2015 1300   GLUCOSEU Negative 01/16/2012 1937   HGBUR 2+ (A) 12/30/2015 1300   BILIRUBINUR NEGATIVE 12/30/2015 1300    BILIRUBINUR Negative 01/16/2012 1937   KETONESUR 1+ (A) 12/30/2015 1300   PROTEINUR 30 (A) 12/30/2015 1300   NITRITE NEGATIVE 12/30/2015 1300   LEUKOCYTESUR NEGATIVE 12/30/2015 1300   LEUKOCYTESUR Negative 01/16/2012 1937   Sepsis Labs: @LABRCNTIP (procalcitonin:4,lacticidven:4) Microbiology: Recent Results (from the past 240 hour(s))  Resp Panel by RT-PCR (Flu A&B, Covid) Anterior Nasal Swab     Status: None   Collection Time: 05/21/22  8:52 PM   Specimen: Anterior Nasal Swab  Result Value Ref Range Status   SARS Coronavirus 2 by RT PCR NEGATIVE NEGATIVE Final    Comment: (NOTE) SARS-CoV-2 target nucleic acids are NOT DETECTED.  The SARS-CoV-2 RNA is generally detectable in upper respiratory specimens during the acute phase of infection. The lowest concentration of SARS-CoV-2 viral copies this assay can detect is 138 copies/mL. A negative result does not preclude SARS-Cov-2 infection and should not be used as the sole basis for treatment or other patient management decisions. A negative result may occur with  improper specimen collection/handling, submission of specimen other than nasopharyngeal swab, presence of viral mutation(s) within the areas targeted by this assay, and inadequate number of viral copies(<138 copies/mL). A negative result must be combined with clinical observations, patient history, and epidemiological information. The expected result is Negative.  Fact Sheet for Patients:  EntrepreneurPulse.com.au  Fact Sheet for Healthcare Providers:  IncredibleEmployment.be  This test is no t yet approved or cleared by the [place] FDA and  has been authorized for detection and/or diagnosis of SARS-CoV-2 by FDA under an Emergency Use Authorization (EUA). This EUA will remain  in effect (meaning this test can be used) for the duration of the COVID-19 declaration under Section 564(b)(1) of the Act, 21 U.S.C.section  360bbb-3(b)(1), unless the authorization is terminated  or revoked sooner.       Influenza A by PCR NEGATIVE NEGATIVE Final   Influenza B by PCR NEGATIVE NEGATIVE Final    Comment: (NOTE) The Xpert Xpress SARS-CoV-2/FLU/RSV plus assay is intended as an aid in the diagnosis of influenza from Nasopharyngeal swab specimens and should not be used as a sole  basis for treatment. Nasal washings and aspirates are unacceptable for Xpert Xpress SARS-CoV-2/FLU/RSV testing.  Fact Sheet for Patients: EntrepreneurPulse.com.au  Fact Sheet for Healthcare Providers: IncredibleEmployment.be  This test is not yet approved or cleared by the [place] FDA and has been authorized for detection and/or diagnosis of SARS-CoV-2 by FDA under an Emergency Use Authorization (EUA). This EUA will remain in effect (meaning this test can be used) for the duration of the COVID-19 declaration under Section 564(b)(1) of the Act, 21 U.S.C. section 360bbb-3(b)(1), unless the authorization is terminated or revoked.  Performed at [facility], [address] [place], [place] [address]       Radiology Studies last 3 days: [facility] Chest [facility] 1 View  Result Date: 05/24/2022 CLINICAL DATA:  Shortness of breath and abnormal lung sounds. High probability perfusion lung scan on 05/22/2022. EXAM: PORTABLE CHEST 1 VIEW COMPARISON:  05/21/2022 FINDINGS: New airspace opacity/consolidation in the left lower lobe. Mild central interstitial accentuation and airway thickening in the lungs. Mild enlargement of the cardiopericardial silhouette. IMPRESSION: 1. New airspace opacity/consolidation in the left lower lobe, suspicious for pneumonia although pulmonary hemorrhage can have. 2. Mild enlargement of the cardiopericardial silhouette. A similar appearance 3. Airway thickening and interstitial accentuation in the lungs favors bronchitis or reactive airways disease. Electronically Signed    By: Van Clines M.D.   On: 05/24/2022 12:40   ECHOCARDIOGRAM COMPLETE  Result Date: 05/23/2022    ECHOCARDIOGRAM REPORT   Patient Name:   LATECIA MILER Date of Exam: 05/23/2022 Medical Rec #:  [id number]      Height:       61.0 in Accession #:    [id number]     Weight:       120.0 lb Date of Birth:  1941-02-06      BSA:          1.520 m Patient Age:    37 years       BP:           148/77 mmHg Patient Gender: F              HR:           90 bpm. Exam Location:  [facility] Procedure: 2D Echo and Strain Analysis Indications:     Pulmonary Embolus I26.09  History:         Patient has no prior history of Echocardiogram examinations.  Sonographer:     Kathlen Brunswick RDCS Referring Phys:  [id number] Soledad Gerlach NIU Diagnosing Phys: Fransico Him MD  Sonographer Comments: Global longitudinal strain was attempted. IMPRESSIONS  1. Left ventricular ejection fraction, by estimation, is 60 to 65%. The left ventricle has normal function. The left ventricle has no regional wall motion abnormalities. There is severe concentric left ventricular hypertrophy. Left ventricular diastolic  parameters are consistent with Grade I diastolic dysfunction (impaired relaxation).  2. Right ventricular systolic function is normal. The right ventricular size is normal. There is normal pulmonary artery systolic pressure. The estimated right ventricular systolic pressure is 86.5 mmHg.  3. Moderate pericardial effusion. The pericardial effusion is circumferential, localized near the right atrium and anterior to the right ventricle. There is no evidence of cardiac tamponade.  4. The mitral valve is normal in structure. No evidence of mitral valve regurgitation. No evidence of mitral stenosis.  5. The aortic valve is normal in structure. Aortic valve regurgitation is mild. No aortic stenosis is present.  6. Aortic dilatation noted. There is mild dilatation of the aortic root,  measuring 40 mm.  7. The inferior vena cava is normal in size with greater than 50%  respiratory variability, suggesting right atrial pressure of 3 mmHg. FINDINGS  Left Ventricle: Left ventricular ejection fraction, by estimation, is 60 to 65%. The left ventricle has normal function. The left ventricle has no regional wall motion abnormalities. Global longitudinal strain performed but not reported based on interpreter judgement due to suboptimal tracking. The left ventricular internal cavity size was normal in size. There is severe concentric left ventricular hypertrophy. Left ventricular diastolic parameters are consistent with Grade I diastolic dysfunction (impaired relaxation). Normal left ventricular filling pressure. Right Ventricle: The right ventricular size is normal. No increase in right ventricular wall thickness. Right ventricular systolic function is normal. There is normal pulmonary artery systolic pressure. The tricuspid regurgitant velocity is 2.60 m/s, and  with an assumed right atrial pressure of 3 mmHg, the estimated right ventricular systolic pressure is 52.8 mmHg. Left Atrium: Left atrial size was normal in size. Right Atrium: Right atrial size was normal in size. Pericardium: A moderately sized pericardial effusion is present. The pericardial effusion is circumferential, localized near the right atrium and anterior to the right ventricle. There is no evidence of cardiac tamponade. Mitral Valve: The mitral valve is normal in structure. No evidence of mitral valve regurgitation. No evidence of mitral valve stenosis. Tricuspid Valve: The tricuspid valve is normal in structure. Tricuspid valve regurgitation is trivial. No evidence of tricuspid stenosis. Aortic Valve: The aortic valve is normal in structure. Aortic valve regurgitation is mild. Aortic regurgitation PHT measures 508 msec. No aortic stenosis is present. Aortic valve peak gradient measures 5.7 mmHg. Pulmonic Valve: The pulmonic valve was normal in structure. Pulmonic valve regurgitation is trivial. No evidence of  pulmonic stenosis. Aorta: Aortic dilatation noted. There is mild dilatation of the aortic root, measuring 40 mm. Venous: The inferior vena cava is normal in size with greater than 50% respiratory variability, suggesting right atrial pressure of 3 mmHg. IAS/Shunts: No atrial level shunt detected by color flow Doppler.  LEFT VENTRICLE PLAX 2D LVIDd:         3.30 cm   Diastology LVIDs:         2.30 cm   LV e' medial:    8.16 cm/s LV PW:         1.70 cm   LV E/e' medial:  8.2 LV IVS:        1.70 cm   LV e' lateral:   9.14 cm/s LVOT diam:     1.80 cm   LV E/e' lateral: 7.3 LV SV:         50 LV SV Index:   33 LVOT Area:     2.54 cm  RIGHT VENTRICLE RV Basal diam:  2.20 cm RV S prime:     14.10 cm/s TAPSE (M-mode): 1.8 cm LEFT ATRIUM           Index        RIGHT ATRIUM          Index LA diam:      2.70 cm 1.78 cm/m   RA Area:     6.99 cm LA Vol (A2C): 14.1 ml 9.28 ml/m   RA Volume:   12.80 ml 8.42 ml/m LA Vol (A4C): 40.0 ml 26.31 ml/m  AORTIC VALVE                 PULMONIC VALVE AV Area (Vmax): 2.33 cm     PV Vmax:  1.09 m/s AV Vmax:        119.00 cm/s  PV Peak grad:  4.8 mmHg AV Peak Grad:   5.7 mmHg LVOT Vmax:      109.00 cm/s LVOT Vmean:     72.800 cm/s LVOT VTI:       0.196 m AI PHT:         508 msec  AORTA Ao Root diam: 4.00 cm Ao Asc diam:  3.40 cm MITRAL VALVE                TRICUSPID VALVE MV Area (PHT): 4.49 cm     TR Peak grad:   27.0 mmHg MV Decel Time: 169 msec     TR Vmax:        260.00 cm/s MV E velocity: 67.10 cm/s MV A velocity: 106.00 cm/s  SHUNTS MV E/A ratio:  0.63         Systemic VTI:  0.20 m                             Systemic Diam: 1.80 cm Fransico Him MD Electronically signed by Fransico Him MD Signature Date/Time: 05/23/2022/1:38:42 PM    Final    NM Pulmonary Perfusion  Result Date: 05/22/2022 CLINICAL DATA:  Pulmonary embolism (PE) suspected, high prob EXAM: NUCLEAR MEDICINE PERFUSION LUNG SCAN TECHNIQUE: Perfusion images were obtained in multiple projections after intravenous  injection of radiopharmaceutical. Ventilation scans intentionally deferred if perfusion scan and chest x-ray adequate for interpretation during COVID 19 epidemic. RADIOPHARMACEUTICALS:  4.37 mCi Tc-1m MAA IV COMPARISON:  Chest x-ray 05/21/2022 FINDINGS: Large segmental perfusion defect of the left upper lobe without corresponding radiographic abnormality. Additional bilateral wedge-shaped subsegmental branch perfusion defects. IMPRESSION: High probability for pulmonary embolism. Electronically Signed   By: Davina Poke D.O.   On: 05/22/2022 10:01   US Venous Img Lower Bilateral  Result Date: 05/21/2022 CLINICAL DATA:  Shortness of breath EXAM: BILATERAL LOWER EXTREMITY VENOUS DOPPLER ULTRASOUND TECHNIQUE: Gray-scale sonography with graded compression, as well as color Doppler and duplex ultrasound were performed to evaluate the lower extremity deep venous systems from the level of the common femoral vein and including the common femoral, femoral, profunda femoral, popliteal and calf veins including the posterior tibial, peroneal and gastrocnemius veins when visible. The superficial great saphenous vein was also interrogated. Spectral Doppler was utilized to evaluate flow at rest and with distal augmentation maneuvers in the common femoral, femoral and popliteal veins. COMPARISON:  None Available. FINDINGS: RIGHT LOWER EXTREMITY Common Femoral Vein: No evidence of thrombus. Normal compressibility, respiratory phasicity and response to augmentation. Saphenofemoral Junction: No evidence of thrombus. Normal compressibility and flow on color Doppler imaging. Profunda Femoral Vein: No evidence of thrombus. Normal compressibility and flow on color Doppler imaging. Femoral Vein: No evidence of thrombus. Normal compressibility, respiratory phasicity and response to augmentation. Popliteal Vein: No evidence of thrombus. Normal compressibility, respiratory phasicity and response to augmentation. Calf Veins: No  evidence of thrombus. Normal compressibility and flow on color Doppler imaging. Superficial Great Saphenous Vein: No evidence of thrombus. Normal compressibility. Venous Reflux:  None. Other Findings:  None. LEFT LOWER EXTREMITY Common Femoral Vein: No evidence of thrombus. Normal compressibility, respiratory phasicity and response to augmentation. Saphenofemoral Junction: No evidence of thrombus. Normal compressibility and flow on color Doppler imaging. Profunda Femoral Vein: No evidence of thrombus. Normal compressibility and flow on color Doppler imaging. Femoral Vein: No evidence of thrombus. Normal compressibility, respiratory phasicity  and response to augmentation. Popliteal Vein: No evidence of thrombus. Normal compressibility, respiratory phasicity and response to augmentation. Calf Veins: No evidence of thrombus. Normal compressibility and flow on color Doppler imaging. Superficial Great Saphenous Vein: No evidence of thrombus. Normal compressibility. Venous Reflux:  None. Other Findings:  None. IMPRESSION: No evidence of deep venous thrombosis in either lower extremity. Electronically Signed   By: Inez Catalina M.D.   On: 05/21/2022 22:37   DG Chest 2 View  Result Date: 05/21/2022 CLINICAL DATA:  sob EXAM: CHEST - 2 VIEW COMPARISON:  December 30, 2015, February 26, 2014 FINDINGS: Evaluation is limited by rotation. The cardiomediastinal silhouette is unchanged and enlarged in contour. Tortuous thoracic aorta. No pleural effusion. No pneumothorax. No acute pleuroparenchymal abnormality. Visualized abdomen is unremarkable. Multilevel degenerative changes of the thoracic spine. IMPRESSION: No acute cardiopulmonary abnormality. Electronically Signed   By: Valentino Saxon M.D.   On: 05/21/2022 16:40             LOS: 2 days      Emeterio Reeve, DO [facility] 05/24/2022, 6:51 PM    Dictation software may have been used to generate the above note. Typos may occur and escape  review in typed/dictated notes. Please contact Dr Sheppard Coil directly for clarity if needed.  Staff may message me via secure chat in [place]  but this may not receive an immediate response,  please page me for urgent matters!  If 7PM-7AM, please contact night coverage [email or web address]

## 2022-05-24 NOTE — Evaluation (Signed)
Physical Therapy Evaluation Patient Details Name: Regina Perkins MRN: [id number] DOB: 04-27-1940 Today's Date: 05/24/2022  History of Present Illness  Regina Perkins is a 82 y.o. female with medical history significant of hypertension, diabetes mellitus, PVD, CKD-3A, tobacco abuse, who presents with shortness breath; admitted for management of acute PE, managed with heparin drip.  Clinical Impression  Patient resting in bed upon arrival to room; endorses generalized discomfort in bed and agreeable for OOB to chair.  Patient alert and oriented to basic information; follows commands, pleasant and cooperative.  Denies pain, but notably SOB at rest/with conversation.  Able to complete bed mobility with mod indep; sit/stand and bed/chair transfer without assist device, cga/min assist.  Persistent SOB with minimal exertion (though sats >90% on RA throughout session); additional gait/mobility deferred as result.  RN/MD informed/aware. Will continue to progress gait next session as medically appropriate to monitor O2 response with household distance gait/mobility tasks. Would benefit from skilled PT to address above deficits and promote optimal return to PLOF.; recommend transition to STR upon discharge from acute hospitalization.  Will continue to monitor progress and update recs as appropriate.      Recommendations for follow up therapy are one component of a multi-disciplinary discharge planning process, led by the attending physician.  Recommendations may be updated based on patient status, additional functional criteria and insurance authorization.  Follow Up Recommendations Skilled nursing-short term rehab (<3 hours/day)      Assistance Recommended at Discharge Frequent or constant Supervision/Assistance  Patient can return home with the following  A little help with walking and/or transfers;A little help with bathing/dressing/bathroom    Equipment Recommendations    Recommendations for Other  Services       Functional Status Assessment Patient has had a recent decline in their functional status and demonstrates the ability to make significant improvements in function in a reasonable and predictable amount of time.     Precautions / Restrictions Precautions Precautions: Fall Restrictions Weight Bearing Restrictions: No      Mobility  Bed Mobility Overal bed mobility: Modified Independent                  Transfers Overall transfer level: Needs assistance Equipment used: None Transfers: Sit to/from Stand, Bed to chair/wheelchair/BSC Sit to Stand: Min guard, Min assist Stand pivot transfers: Min guard, Min assist              Ambulation/Gait               General Gait Details: deferred due to SOB with minimal exertion  Stairs            Wheelchair Mobility    Modified Rankin (Stroke Patients Only)       Balance Overall balance assessment: Needs assistance Sitting-balance support: No upper extremity supported Sitting balance-Leahy Scale: Good     Standing balance support: No upper extremity supported, During functional activity Standing balance-Leahy Scale: Fair                               Pertinent Vitals/Pain Pain Assessment Pain Assessment: No/denies pain    Home Living Family/patient expects to be discharged to:: Private residence Living Arrangements: Other relatives (granddaughter; works outside of the home) Available Help at Discharge: Family;Available 24 hours/day Type of Home: Apartment Home Access: Stairs to enter   Entrance Stairs-Number of Steps: 1   Home Layout: One level Home Equipment: Conservation officer, nature (2 wheels);Cane -  single point;BSC/3in1;Shower seat      Prior Function Prior Level of Function : Independent/Modified Independent             Mobility Comments: Pt endorses having AD but does not utilize at baseline; denies fall history; no home O2. ADLs Comments: Pt endorses being Ind  in self care and IADLs. Family takes her to appointments because she does not drive.     Hand Dominance        Extremity/Trunk Assessment   Upper Extremity Assessment Upper Extremity Assessment: Generalized weakness    Lower Extremity Assessment Lower Extremity Assessment: Generalized weakness (grossly at least 4/5 throughout; no focal wekaness)       Communication   Communication: No difficulties  Cognition Arousal/Alertness: Awake/alert Behavior During Therapy: WFL for tasks assessed/performed Overall Cognitive Status: Within Functional Limits for tasks assessed                                          General Comments      Exercises     Assessment/Plan    PT Assessment Patient needs continued PT services  PT Problem List Decreased strength;Decreased range of motion;Decreased activity tolerance;Decreased balance;Decreased mobility;Decreased knowledge of use of DME;Decreased safety awareness;Decreased knowledge of precautions;Cardiopulmonary status limiting activity       PT Treatment Interventions DME instruction;Gait training;Stair training;Functional mobility training;Therapeutic activities;Therapeutic exercise;Balance training;Patient/family education    PT Goals (Current goals can be found in the Care Plan section)  Acute Rehab PT Goals Patient Stated Goal: to get stronger PT Goal Formulation: With patient Time For Goal Achievement: 06/07/22 Potential to Achieve Goals: Good    Frequency Min 2X/week     Co-evaluation               AM-PAC PT "6 Clicks" Mobility  Outcome Measure Help needed turning from your back to your side while in a flat bed without using bedrails?: None Help needed moving from lying on your back to sitting on the side of a flat bed without using bedrails?: None Help needed moving to and from a bed to a chair (including a wheelchair)?: A Little Help needed standing up from a chair using your arms (e.g.,  wheelchair or bedside chair)?: A Little Help needed to walk in hospital room?: A Little Help needed climbing 3-5 steps with a railing? : A Little 6 Click Score: 20    End of Session   Activity Tolerance: Treatment limited secondary to medical complications (Comment) (significant SOB with exertion) Patient left: in chair;with call bell/phone within reach;with chair alarm set Nurse Communication: Mobility status PT Visit Diagnosis: Muscle weakness (generalized) (M62.81);Difficulty in walking, not elsewhere classified (R26.2)    Time: 1127-1140 PT Time Calculation (min) (ACUTE ONLY): 13 min   Charges:   PT Evaluation $PT Eval High Complexity: 1 High          Rosene Pilling H. Owens Shark, PT, DPT, NCS 05/24/22, 9:55 PM [phone]

## 2022-05-24 NOTE — Consult Note (Signed)
ANTICOAGULATION CONSULT NOTE   Pharmacy Consult for heparin infusion Indication: pulmonary embolus  Allergies  Allergen Reactions   Ivp Dye [Iodinated Contrast Media]    Shellfish Allergy Other (See Comments)    Reaction: unknown     Patient Measurements: Height: 5\' 1"  (154.9 cm) Weight: 54.4 kg (120 lb) IBW/kg (Calculated) : 47.8 Heparin Dosing Weight: 54.4 kg  Vital Signs: Temp: 97.8 F (36.6 C) (01/29 1955) Temp Source: Oral (01/29 1955) BP: 139/83 (01/29 1955) Pulse Rate: 109 (01/29 1955)  Labs: Recent Labs    05/21/22 2036 05/22/22 1038 05/22/22 1904 05/22/22 1904 05/23/22 0405 05/23/22 1131 05/24/22 0041 05/24/22 0538 05/24/22 0909 05/24/22 1914  HGB  --   --  9.2*   < > 9.2*  --   --  10.6*  --   --   HCT  --   --  29.4*  --  29.5*  --   --  33.8*  --   --   PLT  --   --  441*  --  438*  --   --  471*  --   --   APTT  --  36  --   --   --   --   --   --   --   --   LABPROT  --  14.3  --   --   --   --   --   --   --   --   INR  --  1.1  --   --   --   --   --   --   --   --   HEPARINUNFRC  --   --  0.26*   < > 0.41   < > 0.34  --  0.27* 0.42  CREATININE  --   --   --   --  0.83  --   --  0.98  --   --   TROPONINIHS <2  --   --   --   --   --   --   --   --   --    < > = values in this interval not displayed.     Estimated Creatinine Clearance: 34 mL/min (by C-G formula based on SCr of 0.98 mg/dL).   Medical History: Past Medical History:  Diagnosis Date   Chronic kidney disease, stage 3a ([place])    Diabetes mellitus without complication ([place])    HTN (hypertension)    Hypertension    Type II diabetes mellitus with renal manifestations ([place])     Medications:  NO AC prior to admission noted  Assessment: Patient admitted with shortness of breath. PMH includes diabetes, hypertension, and chronic kidney disease stage III. Pharmacy consulted to manage heparin infusion for new dx PE. CBC stable.    Goal of Therapy:  Heparin level 0.3-0.7  units/ml Monitor platelets by anticoagulation protocol: Yes   Date Time HL Rate/Comment 1/27 1904 0.26 Subtherapeutic @ 700 un/hr 1/28 0405 0.41 therapeutic x 1 1/28 1131 0.26 Subtherapeutic,  inc from 800 to 900 u/hr 1/29 0041 0.34 Therapeutic x 1 1/29 0909 0.27 Subtherapeutic  1/29 1914 0.42 Therapeutic x 1  Plan:  - Will continue heparin infusion at 1000 units/hr.  - Recheck heparin level in 8 hours.  - CBC daily while on heparin.    Pearla Dubonnet, PharmD Clinical Pharmacist 05/24/2022 8:02 PM

## 2022-05-24 NOTE — Consult Note (Signed)
ANTICOAGULATION CONSULT NOTE   Pharmacy Consult for heparin infusion Indication: pulmonary embolus  Allergies  Allergen Reactions   Ivp Dye [Iodinated Contrast Media]    Shellfish Allergy Other (See Comments)    Reaction: unknown     Patient Measurements: Height: 5\' 1"  (154.9 cm) Weight: 54.4 kg (120 lb) IBW/kg (Calculated) : 47.8 Heparin Dosing Weight: 54.4 kg  Vital Signs: Temp: 97.7 F (36.5 C) (01/29 0820) BP: 165/86 (01/29 0820) Pulse Rate: 98 (01/29 0820)  Labs: Recent Labs    05/21/22 1620 05/21/22 1621 05/21/22 1621 05/21/22 2036 05/22/22 1038 05/22/22 1904 05/23/22 0405 05/23/22 1131 05/24/22 0041 05/24/22 0538 05/24/22 0909  HGB  --  10.8*   < >  --   --  9.2* 9.2*  --   --  10.6*  --   HCT  --  34.6*   < >  --   --  29.4* 29.5*  --   --  33.8*  --   PLT  --  502*   < >  --   --  441* 438*  --   --  471*  --   APTT  --   --   --   --  36  --   --   --   --   --   --   LABPROT  --   --   --   --  14.3  --   --   --   --   --   --   INR  --   --   --   --  1.1  --   --   --   --   --   --   HEPARINUNFRC  --   --    < >  --   --  0.26* 0.41 0.26* 0.34  --  0.27*  CREATININE  --  1.18*  --   --   --   --  0.83  --   --  0.98  --   TROPONINIHS <2  --   --  <2  --   --   --   --   --   --   --    < > = values in this interval not displayed.     Estimated Creatinine Clearance: 34 mL/min (by C-G formula based on SCr of 0.98 mg/dL).   Medical History: Past Medical History:  Diagnosis Date   Chronic kidney disease, stage 3a ([place])    Diabetes mellitus without complication ([place])    HTN (hypertension)    Hypertension    Type II diabetes mellitus with renal manifestations ([place])     Medications:  NO AC prior to admission noted  Assessment: Patient admitted with shortness of breath. PMH includes diabetes, hypertension, and chronic kidney disease stage III. Pharmacy consulted to manage heparin infusion for new dx PE. CBC stable.    Goal of Therapy:   Heparin level 0.3-0.7 units/ml Monitor platelets by anticoagulation protocol: Yes   Date Time HL Rate/Comment 1/27 1904 0.26 Subtherapeutic @ 700 un/hr 1/28 0405 0.41 therapeutic x 1 1/28 1131 0.26 Subtherapeutic,  inc from 800 to 900 u/hr 1/29 0041 0.34 Therapeutic x 1 1/29 0909 0.27 Subtherapeutic   Plan:  Heparin level is slightly subtherapeutic. Will give heparin bolus of 800 units x 1 and increase heparin infusion to 1000 units/hr. Recheck heparin level in 8 hours. CBC daily while on heparin.     Eleonore Chiquito, PharmD, BCPS

## 2022-05-24 NOTE — Plan of Care (Addendum)
Brief note:  PT denies symptoms but is obviously SOB on minimal exertion. On exam, diminished breath breath sounds L mid/lower, CXR concerning for infiltrate, question hemorrhage/infarct. D/w patient re: CT scan for optimal characterization of this finding given her SOB, she is okay for premedication for contrast. D/w vascular surgery, if hemorrhage could see about embolization but may need transfer for CT surgery, if nothing of concern on CT patient may be able to go home with O2. Await CT results.  Called granddaughter 05/24/22 4:39 PM to update her, no voicemail set up was unable to leave message

## 2022-05-24 NOTE — Evaluation (Signed)
Occupational Therapy Evaluation Patient Details Name: Regina Perkins MRN: [id number] DOB: 02-Oct-1940 Today's Date: 05/24/2022   History of Present Illness Regina Perkins is a 82 y.o. female with medical history significant of hypertension, diabetes mellitus, PVD, CKD-3A, tobacco abuse, who presents with shortness breath.   Clinical Impression   Patient presenting with decreased Ind in self care,balance, functional mobility/transfers, endurance, and safety awareness.  Patient reports being Ind at baseline with self care and IADLs and living at home with granddaughter. Pt endorses not utilizing AD at baseline. Upon starting session, pt reports she feels wet. She is currrently on purewick but it has malfunctioned and pts linen and gown are saturated. Pt performs bed mobility without assistance and ambulates to bathroom with supervision. Pt needing cuing for safety but doffs soiled clothing while seated. Pt stands and washes buttocks and peri area with close supervision while holding grab bar. Pt returns to sit on toilet to thread clothing over B feet and and hips with supervision as well as clean gown. Pt returning to bed with supervision. She endorses family is available 24/7 at discharge if needed. Patient will benefit from acute OT to increase overall independence in the areas of ADLs, functional mobility, and safety awareness  in order to safely discharge home with family.     Recommendations for follow up therapy are one component of a multi-disciplinary discharge planning process, led by the attending physician.  Recommendations may be updated based on patient status, additional functional criteria and insurance authorization.   Follow Up Recommendations  No OT follow up     Assistance Recommended at Discharge Intermittent Supervision/Assistance  Patient can return home with the following A little help with bathing/dressing/bathroom;Assistance with cooking/housework;Help with stairs or ramp for  entrance;Assist for transportation    Functional Status Assessment  Patient has had a recent decline in their functional status and demonstrates the ability to make significant improvements in function in a reasonable and predictable amount of time.  Equipment Recommendations  None recommended by OT       Precautions / Restrictions Precautions Precautions: Fall      Mobility Bed Mobility Overal bed mobility: Modified Independent             General bed mobility comments: no assistance provided    Transfers Overall transfer level: Needs assistance Equipment used: None Transfers: Sit to/from Stand Sit to Stand: Supervision                  Balance Overall balance assessment: Needs assistance Sitting-balance support: Feet supported Sitting balance-Leahy Scale: Good     Standing balance support: During functional activity Standing balance-Leahy Scale: Good                             ADL either performed or assessed with clinical judgement   ADL Overall ADL's : Needs assistance/impaired     Grooming: Wash/dry hands;Wash/dry face;Standing;Supervision/safety       Lower Body Bathing: Administrator, Civil Service;Sit to/from stand   Upper Body Dressing : Set up;Supervision/safety;Sitting   Lower Body Dressing: Supervision/safety;Sit to/from stand   Toilet Transfer: Supervision/safety   Toileting- Water quality scientist and Hygiene: Supervision/safety;Sit to/from stand       Functional mobility during ADLs: Supervision/safety       Vision Patient Visual Report: No change from baseline              Pertinent Vitals/Pain Pain Assessment Pain Assessment: No/denies pain  Hand Dominance Right   Extremity/Trunk Assessment Upper Extremity Assessment Upper Extremity Assessment: Overall WFL for tasks assessed   Lower Extremity Assessment Lower Extremity Assessment: Overall WFL for tasks assessed       Communication  Communication Communication: No difficulties   Cognition Arousal/Alertness: Awake/alert Behavior During Therapy: WFL for tasks assessed/performed Overall Cognitive Status: Within Functional Limits for tasks assessed                                 General Comments: pleasant and cooperative                Home Living Family/patient expects to be discharged to:: Private residence Living Arrangements: Other relatives (granddaughter) Available Help at Discharge: Family;Available 24 hours/day Type of Home: Apartment Home Access: Stairs to enter Entrance Stairs-Number of Steps: 1   Home Layout: One level     Bathroom Shower/Tub: Walk-in shower;Sponge bathes at baseline         Home Equipment: Conservation officer, nature (2 wheels);Cane - single point;BSC/3in1;Shower seat          Prior Functioning/Environment Prior Level of Function : Independent/Modified Independent             Mobility Comments: Pt endorses having AD but does not utilize at baseline ADLs Comments: Pt endorses being Ind in self care and IADLs. Family takes her to appointments because she does not drive.        OT Problem List: Decreased strength;Decreased activity tolerance;Decreased safety awareness;Impaired balance (sitting and/or standing);Decreased knowledge of use of DME or AE      OT Treatment/Interventions: Self-care/ADL training;Therapeutic exercise;Therapeutic activities;Energy conservation;DME and/or AE instruction;Patient/family education;Balance training    OT Goals(Current goals can be found in the care plan section) Acute Rehab OT Goals Patient Stated Goal: to go home and feel better OT Goal Formulation: With patient Time For Goal Achievement: 06/07/22 Potential to Achieve Goals: Good ADL Goals Pt Will Perform Grooming: with modified independence;standing Pt Will Perform Lower Body Dressing: with modified independence;sit to/from stand Pt Will Transfer to Toilet: with modified  independence;ambulating Pt Will Perform Toileting - Clothing Manipulation and hygiene: with modified independence;sit to/from stand  OT Frequency: Min 2X/week       AM-PAC OT "6 Clicks" Daily Activity     Outcome Measure Help from another person eating meals?: None Help from another person taking care of personal grooming?: None Help from another person toileting, which includes using toliet, bedpan, or urinal?: A Little Help from another person bathing (including washing, rinsing, drying)?: A Little Help from another person to put on and taking off regular upper body clothing?: None Help from another person to put on and taking off regular lower body clothing?: A Little 6 Click Score: 21   End of Session Nurse Communication: Mobility status;Other (comment) (hospital gowns soiled with urine.)  Activity Tolerance: Patient tolerated treatment well Patient left: in bed;with call bell/phone within reach;with bed alarm set  OT Visit Diagnosis: Muscle weakness (generalized) (M62.81);Unsteadiness on feet (R26.81)                Time: 8264-1583 OT Time Calculation (min): 20 min Charges:  OT General Charges $OT Visit: 1 Visit OT Evaluation $OT Eval Low Complexity: 1 Low OT Treatments $Self Care/Home Management : 8-22 mins  Darleen Crocker, MS, OTR/L , CBIS ascom [phone]  05/24/22, 1:29 PM

## 2022-05-25 ENCOUNTER — Inpatient Hospital Stay: Payer: Medicare Other

## 2022-05-25 DIAGNOSIS — I3139 Other pericardial effusion (noninflammatory): Secondary | ICD-10-CM | POA: Diagnosis not present

## 2022-05-25 DIAGNOSIS — N1831 Chronic kidney disease, stage 3a: Secondary | ICD-10-CM | POA: Diagnosis not present

## 2022-05-25 DIAGNOSIS — E1129 Type 2 diabetes mellitus with other diabetic kidney complication: Secondary | ICD-10-CM | POA: Diagnosis not present

## 2022-05-25 DIAGNOSIS — R918 Other nonspecific abnormal finding of lung field: Secondary | ICD-10-CM | POA: Diagnosis not present

## 2022-05-25 DIAGNOSIS — I2699 Other pulmonary embolism without acute cor pulmonale: Secondary | ICD-10-CM | POA: Diagnosis not present

## 2022-05-25 DIAGNOSIS — I10 Essential (primary) hypertension: Secondary | ICD-10-CM | POA: Diagnosis not present

## 2022-05-25 LAB — BASIC METABOLIC PANEL
Anion gap: 7 (ref 5–15)
BUN: 15 mg/dL (ref 8–23)
CO2: 30 mmol/L (ref 22–32)
Calcium: 9 mg/dL (ref 8.9–10.3)
Chloride: 96 mmol/L — ABNORMAL LOW (ref 98–111)
Creatinine, Ser: 0.94 mg/dL (ref 0.44–1.00)
GFR, Estimated: >60 mL/min (ref >60)
Glucose, Bld: 185 mg/dL — ABNORMAL HIGH (ref 70–99)
Potassium: 4.4 mmol/L (ref 3.5–5.1)
Sodium: 133 mmol/L — ABNORMAL LOW (ref 135–145)

## 2022-05-25 LAB — CBC
HCT: 32.6 % — ABNORMAL LOW (ref 36.0–46.0)
Hemoglobin: 10.3 g/dL — ABNORMAL LOW (ref 12.0–15.0)
MCH: 26.7 pg (ref 26.0–34.0)
MCHC: 31.6 g/dL (ref 30.0–36.0)
MCV: 84.5 fL (ref 80.0–100.0)
Platelets: 445 10*3/uL — ABNORMAL HIGH (ref 150–400)
RBC: 3.86 MIL/uL — ABNORMAL LOW (ref 3.87–5.11)
RDW: 15.8 % — ABNORMAL HIGH (ref 11.5–15.5)
WBC: 8.7 10*3/uL (ref 4.0–10.5)
nRBC: 0 % (ref 0.0–0.2)

## 2022-05-25 LAB — HEPARIN LEVEL (UNFRACTIONATED): Heparin Unfractionated: 0.55 IU/mL (ref 0.30–0.70)

## 2022-05-25 LAB — GLUCOSE, CAPILLARY
Glucose-Capillary: 139 mg/dL — ABNORMAL HIGH (ref 70–99)
Glucose-Capillary: 196 mg/dL — ABNORMAL HIGH (ref 70–99)
Glucose-Capillary: 105 mg/dL — ABNORMAL HIGH (ref 70–99)
Glucose-Capillary: 151 mg/dL — ABNORMAL HIGH (ref 70–99)

## 2022-05-25 LAB — MRSA NEXT GEN BY PCR, NASAL: MRSA by PCR Next Gen: NOT DETECTED

## 2022-05-25 MED ORDER — ENOXAPARIN SODIUM 40 MG/0.4ML IJ SOSY
40.0000 mg | PREFILLED_SYRINGE | INTRAMUSCULAR | Status: DC
  Administered 2022-05-25 – 2022-05-26 (×2): 40 mg via SUBCUTANEOUS
  Filled 2022-05-25 (×2): qty 0.4

## 2022-05-25 MED ORDER — BARIUM SULFATE 2 % PO SUSP
450.0000 mL | ORAL | Status: AC
  Administered 2022-05-25 (×2): 450 mL via ORAL

## 2022-05-25 MED ORDER — SODIUM CHLORIDE 0.9 % IV SOLN
2.0000 g | Freq: Two times a day (BID) | INTRAVENOUS | Status: DC
  Administered 2022-05-25 – 2022-05-27 (×5): 2 g via INTRAVENOUS
  Filled 2022-05-25: qty 12.5
  Filled 2022-05-25 (×4): qty 2
  Filled 2022-05-25 (×2): qty 12.5

## 2022-05-25 NOTE — Progress Notes (Signed)
CSW spoke with MD prior to speaking to patient regarding SNF. 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 Consults placed to medical oncology.  CSW will hold on snf consult for today for patient to process news.   Kelby Fam, [place], MSW, [place]

## 2022-05-25 NOTE — Consult Note (Addendum)
Hematology/Oncology Consult note [phone] [phone]      Patient Care Team: [facility] as PCP - General (General Practice)   Name of the patient: Regina Perkins  [id number]  09-Oct-1940   REASON FOR COSULTATION:  Lung mass History of presenting illness-  82 y.o. female with PMH listed at below who presents to ER for evaluation of shortness of breath . . 05/22/2022 in the emergency room, she was found to have hypoxia with oxygen levels of 86% on room air, improved to 94 with 2 L of oxygen.  D-dimer was elevated at 1.29, troponin negative.  Lower extremity Doppler was negative for DVT.  VQ scan high probability PE (Large segmental perfusion defect of the left upper lobe without corresponding radiographic abnormality. Additional bilateral wedge-shaped subsegmental branch perfusion defects .  Patient was admitted and started on heparin Echocardiogram showed no RV strain.  Moderate pericardial effusion. Over her hospitalization, she continues to have shortness of breath with minimal exertion. Chest x-ray showed no left lobe opacity.  05/24/2022, CT angio chest PE protocol showed no PE.  There is left upper lobe/vascular mass 4.9 x 3.5 x 4.4 cm, suspicious for neoplasm.  Mediastinal and hilar lymphadenopathy.  Multiple pulmonary nodules measures up to 11 mm worrisome for metastasis.  Small left pleural effusion.  Patchy groundglass airspace opacity in the left upper lobe worrisome for infection.  Moderate sized pericardial effusion.  Cholelithiasis.  Nonobstructing left renal calculus  Oncology was consulted for further evaluation and management. She reports that her granddaughter has noticed for being more windy for a month, she started experience symptoms for about a week.  Unintentional weight loss about 20 pounds during possible months.  No appetite does not eat well.  She lives at home with granddaughter Patient has a longstanding history of  smoking   Allergies  Allergen Reactions   Ivp Dye [Iodinated Contrast Media]    Shellfish Allergy Other (See Comments)    Reaction: unknown     Patient Active Problem List   Diagnosis Date Noted   Acute pulmonary embolism ([place]) 05/22/2022   Chronic kidney disease, stage 3a ([place]) 05/22/2022   HTN (hypertension) 05/22/2022   Type II diabetes mellitus with renal manifestations ([place]) 05/22/2022   Hypokalemia 05/22/2022   Tobacco abuse 05/22/2022   Hypomagnesemia 05/22/2022   Atherosclerosis of native arteries of extremity with intermittent claudication ([place]) 03/12/2019   Essential hypertension 03/12/2019   Diabetes ([place]) 03/12/2019   Sepsis ([place]) 12/30/2015     Past Medical History:  Diagnosis Date   Chronic kidney disease, stage 3a ([place])    Diabetes mellitus without complication ([place])    HTN (hypertension)    Hypertension    Type II diabetes mellitus with renal manifestations ([place])      No past surgical history on file.  Social History   Socioeconomic History   Marital status: Widowed    Spouse name: Not on file   Number of children: Not on file   Years of education: Not on file   Highest education level: Not on file  Occupational History   Not on file  Tobacco Use   Smoking status: Every Day    Packs/day: 0.50    Types: Cigarettes   Smokeless tobacco: Never  Substance and Sexual Activity   Alcohol use: No   Drug use: No   Sexual activity: Not on file  Other Topics Concern   Not on file  Social History Narrative   Not on file  Social Determinants of Health   Financial Resource Strain: Not on file  Food Insecurity: Not on file  Transportation Needs: Not on file  Physical Activity: Not on file  Stress: Not on file  Social Connections: Not on file  Intimate Partner Violence: Not on file     Family History  Problem Relation Age of Onset   Asthma Mother    Heart disease Mother    Hypertension Mother    Diabetes Mother    Stroke Mother     Diabetes Sister    Diabetes Brother      Current Facility-Administered Medications:    acetaminophen (TYLENOL) tablet 650 mg, 650 mg, Oral, Q6H PRN, Ivor Costa, MD, 650 mg at 05/25/22 0930   albuterol (PROVENTIL) (2.5 MG/3ML) 0.083% nebulizer solution 3 mL, 3 mL, Inhalation, Q4H PRN, Ivor Costa, MD   amitriptyline (ELAVIL) tablet 25 mg, 25 mg, Oral, q morning, Delman Kitten, MD, 25 mg at 05/25/22 0924   amLODipine (NORVASC) tablet 10 mg, 10 mg, Oral, Daily, Ivor Costa, MD, 10 mg at 05/25/22 0924   ceFEPIme (MAXIPIME) 2 g in sodium chloride 0.9 % 100 mL IVPB, 2 g, Intravenous, Q12H, Dorothe Pea, RPH, Last Rate: 200 mL/hr at 05/25/22 0922, 2 g at 05/25/22 9735   dextromethorphan-guaiFENesin ([place] DM) 30-600 MG per 12 hr tablet 1 tablet, 1 tablet, Oral, BID PRN, Ivor Costa, MD   hydrALAZINE (APRESOLINE) injection 5 mg, 5 mg, Intravenous, Q2H PRN, Ivor Costa, MD   insulin aspart (novoLOG) injection 0-5 Units, 0-5 Units, Subcutaneous, QHS, Niu, Xilin, MD   insulin aspart (novoLOG) injection 0-9 Units, 0-9 Units, Subcutaneous, TID WC, Ivor Costa, MD, 2 Units at 05/25/22 1240   lisinopril (ZESTRIL) tablet 40 mg, 40 mg, Oral, Daily, Ivor Costa, MD, 40 mg at 05/25/22 3299   nicotine (NICODERM CQ - dosed in mg/24 hours) patch 21 mg, 21 mg, Transdermal, Daily, Ivor Costa, MD, 21 mg at 05/25/22 0925   ondansetron (ZOFRAN) injection 4 mg, 4 mg, Intravenous, Q8H PRN, Ivor Costa, MD  Review of Systems  Constitutional:  Positive for appetite change and unexpected weight change. Negative for chills, fatigue and fever.  HENT:   Negative for hearing loss and voice change.   Eyes:  Negative for eye problems.  Respiratory:  Positive for shortness of breath. Negative for chest tightness, cough and hemoptysis.   Cardiovascular:  Negative for chest pain.  Gastrointestinal:  Negative for abdominal distention, abdominal pain and blood in stool.  Endocrine: Negative for hot flashes.  Genitourinary:  Negative  for difficulty urinating and frequency.   Musculoskeletal:  Negative for arthralgias.  Skin:  Negative for itching and rash.  Neurological:  Negative for extremity weakness.  Hematological:  Negative for adenopathy.  Psychiatric/Behavioral:  Negative for confusion.     PHYSICAL EXAM Vitals:   05/25/22 0034 05/25/22 0553 05/25/22 0744 05/25/22 1208  BP: 114/74 131/72 138/76 (!) 131/91  Pulse: 87 96 93 (!) 103  Resp: 20 20 15 16   Temp: 97.6 F (36.4 C) 98.8 F (37.1 C) 98.6 F (37 C) 98.3 F (36.8 C)  TempSrc:   Oral Oral  SpO2: 100% 100% 98% 96%  Weight:      Height:       Physical Exam Constitutional:      General: She is not in acute distress.    Appearance: She is not diaphoretic.  HENT:     Head: Normocephalic.     Nose: Nose normal.     Mouth/Throat:  Pharynx: No oropharyngeal exudate.  Eyes:     General: No scleral icterus.    Pupils: Pupils are equal, round, and reactive to light.  Cardiovascular:     Rate and Rhythm: Normal rate.  Pulmonary:     Effort: Pulmonary effort is normal. No respiratory distress.     Breath sounds: No wheezing.  Abdominal:     General: There is no distension.     Palpations: Abdomen is soft.     Tenderness: There is no abdominal tenderness.  Musculoskeletal:        General: Normal range of motion.     Cervical back: Normal range of motion.  Skin:    General: Skin is warm and dry.     Findings: No erythema.  Neurological:     Mental Status: She is alert and oriented to person, place, and time. Mental status is at baseline.     Cranial Nerves: No cranial nerve deficit.     Motor: No abnormal muscle tone.  Psychiatric:        Mood and Affect: Mood and affect normal.       LABORATORY STUDIES    Latest Ref Rng & Units 05/25/2022    2:47 AM 05/24/2022    5:38 AM 05/23/2022    4:05 AM  CBC  WBC 4.0 - 10.5 K/uL 8.7  9.7  9.3   Hemoglobin 12.0 - 15.0 g/dL 10.3  10.6  9.2   Hematocrit 36.0 - 46.0 % 32.6  33.8  29.5    Platelets 150 - 400 K/uL 445  471  438       Latest Ref Rng & Units 05/25/2022    2:47 AM 05/24/2022    5:38 AM 05/23/2022    4:05 AM  CMP  Glucose 70 - 99 mg/dL 185  118  102   BUN 8 - 23 mg/dL 15  13  17    Creatinine 0.44 - 1.00 mg/dL 0.94  0.98  0.83   Sodium 135 - 145 mmol/L 133  139  136   Potassium 3.5 - 5.1 mmol/L 4.4  4.1  3.6   Chloride 98 - 111 mmol/L 96  99  98   CO2 22 - 32 mmol/L 30  30  29    Calcium 8.9 - 10.3 mg/dL 9.0  9.0  8.7      RADIOGRAPHIC STUDIES: I have personally reviewed the radiological images as listed and agreed with the findings in the report. CT Angio Chest Pulmonary Embolism (PE) W or WO Contrast  Result Date: 05/24/2022 CLINICAL DATA:  Abnormal x-ray.  Known PE on V/Q scan. EXAM: CT ANGIOGRAPHY CHEST WITH CONTRAST TECHNIQUE: Multidetector CT imaging of the chest was performed using the standard protocol during bolus administration of intravenous contrast. Multiplanar CT image reconstructions and MIPs were obtained to evaluate the vascular anatomy. RADIATION DOSE REDUCTION: This exam was performed according to the departmental dose-optimization program which includes automated exposure control, adjustment of the mA and/or kV according to patient size and/or use of iterative reconstruction technique. CONTRAST:  46mL OMNIPAQUE IOHEXOL 350 MG/ML SOLN COMPARISON:  Chest x-ray 05/24/2022. Nuclear medicine perfusion lung scan 05/22/2022. FINDINGS: Cardiovascular: There is adequate opacification of the pulmonary arteries to the segmental level. There is no evidence for pulmonary embolism. The aorta is ectatic without focal dilatation. Heart is normal in size. There is a moderate-sized pericardial effusion. There are atherosclerotic calcifications of the aorta. Mediastinum/Nodes: Abnormal masslike soft tissue density in the prevascular space identified extending into the left upper  lobe. This measures 4.9 x 3.5 x 4.4 cm. There is cutoff of bronchi in this region. There  is an enlarged subcarinal lymph node measuring 13 mm short axis. There is an enlarged right hilar lymph node measuring 15 mm short axis. There is an enlarged left hilar lymph node measuring 12 mm short axis. The stomach is mildly distended with fluid. The visualized thyroid gland is within normal limits. Lungs/Pleura: There are additional bilateral pulmonary nodules measuring up to 11 mm (image 5/71 in the right lower lobe) there also some ill-defined patchy ground-glass and airspace opacities in the left upper lobe. There is a small left pleural effusion with compressive atelectasis of the left lower lobe. Secretions or other material noted within the left lower lobe bronchus image 5/60. Trachea and airways are otherwise patent. Upper Abdomen: Gallstones are present. There are rounded hypodense areas within the liver measuring up to 19 mm favored as cysts. There are atherosclerotic calcifications of the aorta. There is a punctate nonobstructing left renal calculus. Musculoskeletal: No chest wall abnormality. No acute or significant osseous findings. Review of the MIP images confirms the above findings. IMPRESSION: 1. No evidence for pulmonary embolism. 2. Left upper lobe/prevascular mass worrisome for neoplasm. 3. Mediastinal and hilar lymphadenopathy. 4. Multiple pulmonary nodules measuring up to 11 mm worrisome for metastatic disease. 5. Small left pleural effusion. 6. Patchy ground-glass and airspace opacities in the left upper lobe worrisome for infection. 7. Moderate-sized pericardial effusion. 8. Cholelithiasis. 9. Nonobstructing left renal calculus. Aortic Atherosclerosis (ICD10-I70.0). Electronically Signed   By: Ronney Asters M.D.   On: 05/24/2022 19:58   DG Chest Port 1 View  Result Date: 05/24/2022 CLINICAL DATA:  Shortness of breath and abnormal lung sounds. High probability perfusion lung scan on 05/22/2022. EXAM: PORTABLE CHEST 1 VIEW COMPARISON:  05/21/2022 FINDINGS: New airspace  opacity/consolidation in the left lower lobe. Mild central interstitial accentuation and airway thickening in the lungs. Mild enlargement of the cardiopericardial silhouette. IMPRESSION: 1. New airspace opacity/consolidation in the left lower lobe, suspicious for pneumonia although pulmonary hemorrhage can have. 2. Mild enlargement of the cardiopericardial silhouette. A similar appearance 3. Airway thickening and interstitial accentuation in the lungs favors bronchitis or reactive airways disease. Electronically Signed   By: Van Clines M.D.   On: 05/24/2022 12:40   ECHOCARDIOGRAM COMPLETE  Result Date: 05/23/2022    ECHOCARDIOGRAM REPORT   Patient Name:   JOSSLYNN MENTZER Date of Exam: 05/23/2022 Medical Rec #:  [id number]      Height:       61.0 in Accession #:    [id number]     Weight:       120.0 lb Date of Birth:  1941/04/02      BSA:          1.520 m Patient Age:    6 years       BP:           148/77 mmHg Patient Gender: F              HR:           90 bpm. Exam Location:  [facility] Procedure: 2D Echo and Strain Analysis Indications:     Pulmonary Embolus I26.09  History:         Patient has no prior history of Echocardiogram examinations.  Sonographer:     Kathlen Brunswick RDCS Referring Phys:  [id number] Soledad Gerlach NIU Diagnosing Phys: Fransico Him MD  Sonographer Comments: Global longitudinal strain was attempted. IMPRESSIONS  1. Left ventricular ejection fraction, by estimation, is 60 to 65%. The left ventricle has normal function. The left ventricle has no regional wall motion abnormalities. There is severe concentric left ventricular hypertrophy. Left ventricular diastolic  parameters are consistent with Grade I diastolic dysfunction (impaired relaxation).  2. Right ventricular systolic function is normal. The right ventricular size is normal. There is normal pulmonary artery systolic pressure. The estimated right ventricular systolic pressure is 82.9 mmHg.  3. Moderate pericardial effusion. The pericardial  effusion is circumferential, localized near the right atrium and anterior to the right ventricle. There is no evidence of cardiac tamponade.  4. The mitral valve is normal in structure. No evidence of mitral valve regurgitation. No evidence of mitral stenosis.  5. The aortic valve is normal in structure. Aortic valve regurgitation is mild. No aortic stenosis is present.  6. Aortic dilatation noted. There is mild dilatation of the aortic root, measuring 40 mm.  7. The inferior vena cava is normal in size with greater than 50% respiratory variability, suggesting right atrial pressure of 3 mmHg. FINDINGS  Left Ventricle: Left ventricular ejection fraction, by estimation, is 60 to 65%. The left ventricle has normal function. The left ventricle has no regional wall motion abnormalities. Global longitudinal strain performed but not reported based on interpreter judgement due to suboptimal tracking. The left ventricular internal cavity size was normal in size. There is severe concentric left ventricular hypertrophy. Left ventricular diastolic parameters are consistent with Grade I diastolic dysfunction (impaired relaxation). Normal left ventricular filling pressure. Right Ventricle: The right ventricular size is normal. No increase in right ventricular wall thickness. Right ventricular systolic function is normal. There is normal pulmonary artery systolic pressure. The tricuspid regurgitant velocity is 2.60 m/s, and  with an assumed right atrial pressure of 3 mmHg, the estimated right ventricular systolic pressure is 56.2 mmHg. Left Atrium: Left atrial size was normal in size. Right Atrium: Right atrial size was normal in size. Pericardium: A moderately sized pericardial effusion is present. The pericardial effusion is circumferential, localized near the right atrium and anterior to the right ventricle. There is no evidence of cardiac tamponade. Mitral Valve: The mitral valve is normal in structure. No evidence of mitral  valve regurgitation. No evidence of mitral valve stenosis. Tricuspid Valve: The tricuspid valve is normal in structure. Tricuspid valve regurgitation is trivial. No evidence of tricuspid stenosis. Aortic Valve: The aortic valve is normal in structure. Aortic valve regurgitation is mild. Aortic regurgitation PHT measures 508 msec. No aortic stenosis is present. Aortic valve peak gradient measures 5.7 mmHg. Pulmonic Valve: The pulmonic valve was normal in structure. Pulmonic valve regurgitation is trivial. No evidence of pulmonic stenosis. Aorta: Aortic dilatation noted. There is mild dilatation of the aortic root, measuring 40 mm. Venous: The inferior vena cava is normal in size with greater than 50% respiratory variability, suggesting right atrial pressure of 3 mmHg. IAS/Shunts: No atrial level shunt detected by color flow Doppler.  LEFT VENTRICLE PLAX 2D LVIDd:         3.30 cm   Diastology LVIDs:         2.30 cm   LV e' medial:    8.16 cm/s LV PW:         1.70 cm   LV E/e' medial:  8.2 LV IVS:        1.70 cm   LV e' lateral:   9.14 cm/s LVOT diam:     1.80 cm   LV E/e' lateral: 7.3 LV  SV:         50 LV SV Index:   33 LVOT Area:     2.54 cm  RIGHT VENTRICLE RV Basal diam:  2.20 cm RV S prime:     14.10 cm/s TAPSE (M-mode): 1.8 cm LEFT ATRIUM           Index        RIGHT ATRIUM          Index LA diam:      2.70 cm 1.78 cm/m   RA Area:     6.99 cm LA Vol (A2C): 14.1 ml 9.28 ml/m   RA Volume:   12.80 ml 8.42 ml/m LA Vol (A4C): 40.0 ml 26.31 ml/m  AORTIC VALVE                 PULMONIC VALVE AV Area (Vmax): 2.33 cm     PV Vmax:       1.09 m/s AV Vmax:        119.00 cm/s  PV Peak grad:  4.8 mmHg AV Peak Grad:   5.7 mmHg LVOT Vmax:      109.00 cm/s LVOT Vmean:     72.800 cm/s LVOT VTI:       0.196 m AI PHT:         508 msec  AORTA Ao Root diam: 4.00 cm Ao Asc diam:  3.40 cm MITRAL VALVE                TRICUSPID VALVE MV Area (PHT): 4.49 cm     TR Peak grad:   27.0 mmHg MV Decel Time: 169 msec     TR Vmax:         260.00 cm/s MV E velocity: 67.10 cm/s MV A velocity: 106.00 cm/s  SHUNTS MV E/A ratio:  0.63         Systemic VTI:  0.20 m                             Systemic Diam: 1.80 cm Fransico Him MD Electronically signed by Fransico Him MD Signature Date/Time: 05/23/2022/1:38:42 PM    Final    NM Pulmonary Perfusion  Result Date: 05/22/2022 CLINICAL DATA:  Pulmonary embolism (PE) suspected, high prob EXAM: NUCLEAR MEDICINE PERFUSION LUNG SCAN TECHNIQUE: Perfusion images were obtained in multiple projections after intravenous injection of radiopharmaceutical. Ventilation scans intentionally deferred if perfusion scan and chest x-ray adequate for interpretation during COVID 19 epidemic. RADIOPHARMACEUTICALS:  4.37 mCi Tc-40m MAA IV COMPARISON:  Chest x-ray 05/21/2022 FINDINGS: Large segmental perfusion defect of the left upper lobe without corresponding radiographic abnormality. Additional bilateral wedge-shaped subsegmental branch perfusion defects. IMPRESSION: High probability for pulmonary embolism. Electronically Signed   By: Davina Poke D.O.   On: 05/22/2022 10:01   US Venous Img Lower Bilateral  Result Date: 05/21/2022 CLINICAL DATA:  Shortness of breath EXAM: BILATERAL LOWER EXTREMITY VENOUS DOPPLER ULTRASOUND TECHNIQUE: Gray-scale sonography with graded compression, as well as color Doppler and duplex ultrasound were performed to evaluate the lower extremity deep venous systems from the level of the common femoral vein and including the common femoral, femoral, profunda femoral, popliteal and calf veins including the posterior tibial, peroneal and gastrocnemius veins when visible. The superficial great saphenous vein was also interrogated. Spectral Doppler was utilized to evaluate flow at rest and with distal augmentation maneuvers in the common femoral, femoral and popliteal veins. COMPARISON:  None Available. FINDINGS: RIGHT LOWER EXTREMITY Common  Femoral Vein: No evidence of thrombus. Normal  compressibility, respiratory phasicity and response to augmentation. Saphenofemoral Junction: No evidence of thrombus. Normal compressibility and flow on color Doppler imaging. Profunda Femoral Vein: No evidence of thrombus. Normal compressibility and flow on color Doppler imaging. Femoral Vein: No evidence of thrombus. Normal compressibility, respiratory phasicity and response to augmentation. Popliteal Vein: No evidence of thrombus. Normal compressibility, respiratory phasicity and response to augmentation. Calf Veins: No evidence of thrombus. Normal compressibility and flow on color Doppler imaging. Superficial Great Saphenous Vein: No evidence of thrombus. Normal compressibility. Venous Reflux:  None. Other Findings:  None. LEFT LOWER EXTREMITY Common Femoral Vein: No evidence of thrombus. Normal compressibility, respiratory phasicity and response to augmentation. Saphenofemoral Junction: No evidence of thrombus. Normal compressibility and flow on color Doppler imaging. Profunda Femoral Vein: No evidence of thrombus. Normal compressibility and flow on color Doppler imaging. Femoral Vein: No evidence of thrombus. Normal compressibility, respiratory phasicity and response to augmentation. Popliteal Vein: No evidence of thrombus. Normal compressibility, respiratory phasicity and response to augmentation. Calf Veins: No evidence of thrombus. Normal compressibility and flow on color Doppler imaging. Superficial Great Saphenous Vein: No evidence of thrombus. Normal compressibility. Venous Reflux:  None. Other Findings:  None. IMPRESSION: No evidence of deep venous thrombosis in either lower extremity. Electronically Signed   By: Inez Catalina M.D.   On: 05/21/2022 22:37   DG Chest 2 View  Result Date: 05/21/2022 CLINICAL DATA:  sob EXAM: CHEST - 2 VIEW COMPARISON:  December 30, 2015, February 26, 2014 FINDINGS: Evaluation is limited by rotation. The cardiomediastinal silhouette is unchanged and enlarged in contour.  Tortuous thoracic aorta. No pleural effusion. No pneumothorax. No acute pleuroparenchymal abnormality. Visualized abdomen is unremarkable. Multilevel degenerative changes of the thoracic spine. IMPRESSION: No acute cardiopulmonary abnormality. Electronically Signed   By: Valentino Saxon M.D.   On: 05/21/2022 16:40     Assessment and plan-   #left upper lobe/vascular mass, mediastinal/hilar adenopathy, suspicious for neoplasm. I will check abdomen pelvis.  If no other more peripheral accessible site for biopsy, I recommend consulting pulmonology for biopsy via bronchoscopy.  # VQ scan results likely false positive due to left upper lobe mass.  CTA showed no pulmonary embolism.  Recommend to stop heparin.    # Moderate pericardial effusion, recommend cardiology evaluation.  Thank you for allowing me to participate in the care of this patient.   Earlie Server, MD, PhD Hematology Oncology 05/25/2022

## 2022-05-25 NOTE — Care Management Important Message (Signed)
Important Message  Patient Details  Name: Regina Perkins MRN: [id number] Date of Birth: 18-Oct-1940   Medicare Important Message Given:  Yes     Dannette Barbara 05/25/2022, 9:56 AM

## 2022-05-25 NOTE — Progress Notes (Addendum)
PROGRESS NOTE    Regina Perkins   OAC:[id number] DOB: [birth year]  DOA: 05/21/2022 Date of Service: 05/25/22 PCP: [facility] [place]     Brief Narrative / Hospital Course:  Regina Perkins is a 82 y.o. female with medical history significant of hypertension, diabetes mellitus, PVD, CKD-3A, tobacco abuse, who presents to ED via EMS from home 05/21/22 with shortness breath x1 day.  01/26: in ED, oxygen saturation 86% on room air, which improved to 94% on 2 L oxygen. Pt reports IV contrast allergy, (+)D-dimer, CXR neg, Tropes neg, plan VQ scan 01/27: VQ done, high probability PE (Large segmental perfusion defect of the left upper lobe without corresponding radiographic abnormality. Additional bilateral wedge-shaped subsegmental branch perfusion defects"). [place] neg DVT. Admitted to hospitalist service on IV heparin and supplemental O2.  01/28: Echo no RV strain, (+)moderate pericardial effusion, LVH. Pt noting dyspnea, will keep overnight on O2 and remain on heparin  01/29: Normal SpO2 but significant increased WOB and SOB with minimal exertion.  Kept on heparin.  CXR concerning for LLL opacity and noted potential for pulmonary hemorrhage. D/w patient and with vascular surgery, Dr. Lucky Cowboy, we will move forward with CTA chest, premedicating for contrast allergy.  Signed this out to [facility] night team 01/30: CT(+) likely lung cancer. D/w patient. Consults placed to medical oncology re: further workup/tx. Pulm consult for possible bronch.   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  Active Problems:   Essential hypertension   Chronic kidney disease, stage 3a (HCC)   Type II diabetes mellitus with renal manifestations (HCC)   Hypokalemia   Hypomagnesemia   Tobacco abuse    Acute pulmonary embolism (HCC) - RULED OUT  Abnormal CT chest likely malignancy likely pulmonary   Oncology consult - Dr Tasia Catchings CT abd/pelvis Pulmonary consult for bronch - Dr Lanney Gins  Acute pulmonary embolism ([place]) - RULED OUT BY CT Acute hypoxic respiratory failure d/t PE - improved, to RA 2D echocardiogram --> no R heart strain heparin drip --> d/c since PE r/o Supplemental O2 as needed prn albuterol nebs and mucinex    Pericardial effusion Spoke w/ Dr Radford Pax, can follow outpatient   Essential hypertension IV hydralazine as needed Amlodipine, lisinopril   Chronic kidney disease, stage 3a ([place]):  Renal function close to baseline.   Follow-up with BMP   Type II diabetes mellitus with renal manifestations ([place]):  no A1c available in epic.  Blood sugar 176.  Patient is taking metformin at home SSI Check A1c   Hypokalemia and hypomagnesemia on admission Potassium 3.3, magnesium 1.4 Repleted potassium and magnesium Check  phosphorus level --> 3.0 Monitor labs    DVT prophylaxis: lovenox Pertinent IV fluids/nutrition: no continuous IV fluids Central lines / invasive devices: none  Code Status: FULL CODE  Current Admission Status: inpatient   TOC needs / Dispo plan: none at this time, may need home O2 Barriers to discharge / significant pending items: dyspnea, pending oncology consult        Subjective / Brief ROS:  Patient reports feeling okay this morning other than SOB Denies CP Pain controlled.   Family Communication: spoke w/ son on the phone 05/25/22 5:32 PM     Objective Findings:  Vitals:   05/24/22 2147 05/25/22 0034 05/25/22 0553 05/25/22 0744  BP:  114/74 131/72 138/76  Pulse:  87 96 93  Resp:  20 20  15  Temp:  97.6 F (36.4 C) 98.8 F (37.1 C) 98.6 F (37 C)  TempSrc:    Oral  SpO2: 92% 100% 100% 98%  Weight:      Height:        Intake/Output Summary (Last 24 hours) at 05/25/2022 1028 Last data filed at 05/25/2022 0900 Gross per 24 hour  Intake 471.37 ml  Output 650 ml  Net -178.63 ml    Filed Weights   05/22/22 1028  Weight:  54.4 kg    Examination:  Physical Exam Constitutional:      General: She is not in acute distress. Cardiovascular:     Rate and Rhythm: Normal rate and regular rhythm.  Pulmonary:     Effort: Pulmonary effort is normal. Tachypnea (becomes tachypneic on exertion) present.     Breath sounds: Examination of the left-middle field reveals decreased breath sounds. Examination of the left-lower field reveals decreased breath sounds. Decreased breath sounds present.  Musculoskeletal:        General: Normal range of motion.     Right lower leg: No edema.     Left lower leg: No edema.  Skin:    General: Skin is warm and dry.  Neurological:     General: No focal deficit present.     Mental Status: She is alert and oriented to person, place, and time.  Psychiatric:        Mood and Affect: Mood normal.        Behavior: Behavior normal.          Scheduled Medications:   amitriptyline  25 mg Oral q morning   amLODipine  10 mg Oral Daily   insulin aspart  0-5 Units Subcutaneous QHS   insulin aspart  0-9 Units Subcutaneous TID WC   lisinopril  40 mg Oral Daily   nicotine  21 mg Transdermal Daily    Continuous Infusions:  ceFEPime (MAXIPIME) IV 2 g (05/25/22 0922)   heparin 1,000 Units/hr (05/25/22 0556)    PRN Medications:  acetaminophen, albuterol, dextromethorphan-guaiFENesin, hydrALAZINE, ondansetron (ZOFRAN) IV  Antimicrobials from admission:  Anti-infectives (From admission, onward)    Start     Dose/Rate Route Frequency Ordered Stop   05/25/22 0800  ceFEPIme (MAXIPIME) 2 g in sodium chloride 0.9 % 100 mL IVPB        2 g 200 mL/hr over 30 Minutes Intravenous Every 12 hours 05/25/22 0709             Data Reviewed:  I have personally reviewed the following...  CBC: Recent Labs  Lab 05/21/22 1621 05/22/22 1904 05/23/22 0405 05/24/22 0538 05/25/22 0247  WBC 10.6* 9.8 9.3 9.7 8.7  HGB 10.8* 9.2* 9.2* 10.6* 10.3*  HCT 34.6* 29.4* 29.5* 33.8* 32.6*  MCV 85.6  85.7 85.5 86.2 84.5  PLT 502* 441* 438* 471* 445*    Basic Metabolic Panel: Recent Labs  Lab 05/21/22 1621 05/22/22 1037 05/22/22 1038 05/23/22 0405 05/24/22 0538 05/25/22 0247  NA 136  --   --  136 139 133*  K 3.3*  --   --  3.6 4.1 4.4  CL 93*  --   --  98 99 96*  CO2 30  --   --  29 30 30   GLUCOSE 176*  --   --  102* 118* 185*  BUN 27*  --   --  17 13 15   CREATININE 1.18*  --   --  0.83 0.98 0.94  CALCIUM 9.5  --   --  8.7* 9.0 9.0  MG  --   --  1.4* 1.6*  --   --   PHOS  --  3.0  --   --   --   --     GFR: Estimated Creatinine Clearance: 35.4 mL/min (by C-G formula based on SCr of 0.94 mg/dL). Liver Function Tests: No results for input(s): "AST", "ALT", "ALKPHOS", "BILITOT", "PROT", "ALBUMIN" in the last 168 hours. No results for input(s): "LIPASE", "AMYLASE" in the last 168 hours. No results for input(s): "AMMONIA" in the last 168 hours. Coagulation Profile: Recent Labs  Lab 05/22/22 1038  INR 1.1    Cardiac Enzymes: No results for input(s): "CKTOTAL", "CKMB", "CKMBINDEX", "TROPONINI" in the last 168 hours. BNP (last 3 results) No results for input(s): "PROBNP" in the last 8760 hours. HbA1C: Recent Labs    05/22/22 1038  HGBA1C 7.1*    CBG: Recent Labs  Lab 05/24/22 0822 05/24/22 1158 05/24/22 1654 05/24/22 2107 05/25/22 0746  GLUCAP 114* 192* 158* 186* 139*    Lipid Profile: No results for input(s): "CHOL", "HDL", "LDLCALC", "TRIG", "CHOLHDL", "LDLDIRECT" in the last 72 hours. Thyroid Function Tests: No results for input(s): "TSH", "T4TOTAL", "FREET4", "T3FREE", "THYROIDAB" in the last 72 hours. Anemia Panel: No results for input(s): "VITAMINB12", "FOLATE", "FERRITIN", "TIBC", "IRON", "RETICCTPCT" in the last 72 hours. Most Recent Urinalysis On File:     Component Value Date/Time   COLORURINE STRAW (A) 12/30/2015 1300   APPEARANCEUR CLEAR (A) 12/30/2015 1300   APPEARANCEUR Cloudy 01/16/2012 1937   LABSPEC 1.005 12/30/2015 1300   LABSPEC  1.028 01/16/2012 1937   PHURINE 7.0 12/30/2015 1300   GLUCOSEU >500 (A) 12/30/2015 1300   GLUCOSEU Negative 01/16/2012 1937   HGBUR 2+ (A) 12/30/2015 1300   BILIRUBINUR NEGATIVE 12/30/2015 1300   BILIRUBINUR Negative 01/16/2012 1937   KETONESUR 1+ (A) 12/30/2015 1300   PROTEINUR 30 (A) 12/30/2015 1300   NITRITE NEGATIVE 12/30/2015 1300   LEUKOCYTESUR NEGATIVE 12/30/2015 1300   LEUKOCYTESUR Negative 01/16/2012 1937   Sepsis Labs: @LABRCNTIP (procalcitonin:4,lacticidven:4) Microbiology: Recent Results (from the past 240 hour(s))  Resp Panel by RT-PCR (Flu A&B, Covid) Anterior Nasal Swab     Status: None   Collection Time: 05/21/22  8:52 PM   Specimen: Anterior Nasal Swab  Result Value Ref Range Status   SARS Coronavirus 2 by RT PCR NEGATIVE NEGATIVE Final    Comment: (NOTE) SARS-CoV-2 target nucleic acids are NOT DETECTED.  The SARS-CoV-2 RNA is generally detectable in upper respiratory specimens during the acute phase of infection. The lowest concentration of SARS-CoV-2 viral copies this assay can detect is 138 copies/mL. A negative result does not preclude SARS-Cov-2 infection and should not be used as the sole basis for treatment or other patient management decisions. A negative result may occur with  improper specimen collection/handling, submission of specimen other than nasopharyngeal swab, presence of viral mutation(s) within the areas targeted by this assay, and inadequate number of viral copies(<138 copies/mL). A negative result must be combined with clinical observations, patient history, and epidemiological information. The expected result is Negative.  Fact Sheet for Patients:  EntrepreneurPulse.com.au  Fact Sheet for Healthcare Providers:  IncredibleEmployment.be  This test is no t yet approved or cleared by the [place] FDA and  has been authorized for detection and/or diagnosis of SARS-CoV-2 by FDA under an Emergency  Use Authorization (EUA). This EUA will remain  in effect (meaning this test can be used) for the duration of the COVID-19 declaration under Section 564(b)(1) of the Act,  21 U.S.C.section 360bbb-3(b)(1), unless the authorization is terminated  or revoked sooner.       Influenza A by PCR NEGATIVE NEGATIVE Final   Influenza B by PCR NEGATIVE NEGATIVE Final    Comment: (NOTE) The Xpert Xpress SARS-CoV-2/FLU/RSV plus assay is intended as an aid in the diagnosis of influenza from Nasopharyngeal swab specimens and should not be used as a sole basis for treatment. Nasal washings and aspirates are unacceptable for Xpert Xpress SARS-CoV-2/FLU/RSV testing.  Fact Sheet for Patients: EntrepreneurPulse.com.au  Fact Sheet for Healthcare Providers: IncredibleEmployment.be  This test is not yet approved or cleared by the [place] FDA and has been authorized for detection and/or diagnosis of SARS-CoV-2 by FDA under an Emergency Use Authorization (EUA). This EUA will remain in effect (meaning this test can be used) for the duration of the COVID-19 declaration under Section 564(b)(1) of the Act, 21 U.S.C. section 360bbb-3(b)(1), unless the authorization is terminated or revoked.  Performed at [facility], [address] [place], [place] [address]   MRSA Next Gen by PCR, Nasal     Status: None   Collection Time: 05/25/22  3:38 AM   Specimen: Nasal Mucosa; Nasal Swab  Result Value Ref Range Status   MRSA by PCR Next Gen NOT DETECTED NOT DETECTED Final    Comment: (NOTE) The GeneXpert MRSA Assay (FDA approved for NASAL specimens only), is one component of a comprehensive MRSA colonization surveillance program. It is not intended to diagnose MRSA infection nor to guide or monitor treatment for MRSA infections. Test performance is not FDA approved in patients less than 77 years old. Performed at [facility], [address]  [place], [place] [address]       Radiology Studies last 3 days: CT Angio Chest Pulmonary Embolism (PE) W or WO Contrast  Result Date: 05/24/2022 CLINICAL DATA:  Abnormal x-ray.  Known PE on V/Q scan. EXAM: CT ANGIOGRAPHY CHEST WITH CONTRAST TECHNIQUE: Multidetector CT imaging of the chest was performed using the standard protocol during bolus administration of intravenous contrast. Multiplanar CT image reconstructions and MIPs were obtained to evaluate the vascular anatomy. RADIATION DOSE REDUCTION: This exam was performed according to the departmental dose-optimization program which includes automated exposure control, adjustment of the mA and/or kV according to patient size and/or use of iterative reconstruction technique. CONTRAST:  91mL OMNIPAQUE IOHEXOL 350 MG/ML SOLN COMPARISON:  Chest x-ray 05/24/2022. Nuclear medicine perfusion lung scan 05/22/2022. FINDINGS: Cardiovascular: There is adequate opacification of the pulmonary arteries to the segmental level. There is no evidence for pulmonary embolism. The aorta is ectatic without focal dilatation. Heart is normal in size. There is a moderate-sized pericardial effusion. There are atherosclerotic calcifications of the aorta. Mediastinum/Nodes: Abnormal masslike soft tissue density in the prevascular space identified extending into the left upper lobe. This measures 4.9 x 3.5 x 4.4 cm. There is cutoff of bronchi in this region. There is an enlarged subcarinal lymph node measuring 13 mm short axis. There is an enlarged right hilar lymph node measuring 15 mm short axis. There is an enlarged left hilar lymph node measuring 12 mm short axis. The stomach is mildly distended with fluid. The visualized thyroid gland is within normal limits. Lungs/Pleura: There are additional bilateral pulmonary nodules measuring up to 11 mm (image 5/71 in the right lower lobe) there also some ill-defined patchy ground-glass and airspace opacities in the left upper lobe. There is a  small left pleural effusion with compressive atelectasis of the left lower lobe. Secretions or  other material noted within the left lower lobe bronchus image 5/60. Trachea and airways are otherwise patent. Upper Abdomen: Gallstones are present. There are rounded hypodense areas within the liver measuring up to 19 mm favored as cysts. There are atherosclerotic calcifications of the aorta. There is a punctate nonobstructing left renal calculus. Musculoskeletal: No chest wall abnormality. No acute or significant osseous findings. Review of the MIP images confirms the above findings. IMPRESSION: 1. No evidence for pulmonary embolism. 2. Left upper lobe/prevascular mass worrisome for neoplasm. 3. Mediastinal and hilar lymphadenopathy. 4. Multiple pulmonary nodules measuring up to 11 mm worrisome for metastatic disease. 5. Small left pleural effusion. 6. Patchy ground-glass and airspace opacities in the left upper lobe worrisome for infection. 7. Moderate-sized pericardial effusion. 8. Cholelithiasis. 9. Nonobstructing left renal calculus. Aortic Atherosclerosis (ICD10-I70.0). Electronically Signed   By: Ronney Asters M.D.   On: 05/24/2022 19:58   DG Chest Port 1 View  Result Date: 05/24/2022 CLINICAL DATA:  Shortness of breath and abnormal lung sounds. High probability perfusion lung scan on 05/22/2022. EXAM: PORTABLE CHEST 1 VIEW COMPARISON:  05/21/2022 FINDINGS: New airspace opacity/consolidation in the left lower lobe. Mild central interstitial accentuation and airway thickening in the lungs. Mild enlargement of the cardiopericardial silhouette. IMPRESSION: 1. New airspace opacity/consolidation in the left lower lobe, suspicious for pneumonia although pulmonary hemorrhage can have. 2. Mild enlargement of the cardiopericardial silhouette. A similar appearance 3. Airway thickening and interstitial accentuation in the lungs favors bronchitis or reactive airways disease. Electronically Signed   By: Van Clines  M.D.   On: 05/24/2022 12:40   ECHOCARDIOGRAM COMPLETE  Result Date: 05/23/2022    ECHOCARDIOGRAM REPORT   Patient Name:   Regina Perkins Date of Exam: 05/23/2022 Medical Rec #:  [id number]      Height:       61.0 in Accession #:    [id number]     Weight:       120.0 lb Date of Birth:  1940/08/15      BSA:          1.520 m Patient Age:    67 years       BP:           148/77 mmHg Patient Gender: F              HR:           90 bpm. Exam Location:  [facility] Procedure: 2D Echo and Strain Analysis Indications:     Pulmonary Embolus I26.09  History:         Patient has no prior history of Echocardiogram examinations.  Sonographer:     Kathlen Brunswick RDCS Referring Phys:  [id number] Soledad Gerlach NIU Diagnosing Phys: Fransico Him MD  Sonographer Comments: Global longitudinal strain was attempted. IMPRESSIONS  1. Left ventricular ejection fraction, by estimation, is 60 to 65%. The left ventricle has normal function. The left ventricle has no regional wall motion abnormalities. There is severe concentric left ventricular hypertrophy. Left ventricular diastolic  parameters are consistent with Grade I diastolic dysfunction (impaired relaxation).  2. Right ventricular systolic function is normal. The right ventricular size is normal. There is normal pulmonary artery systolic pressure. The estimated right ventricular systolic pressure is 23.5 mmHg.  3. Moderate pericardial effusion. The pericardial effusion is circumferential, localized near the right atrium and anterior to the right ventricle. There is no evidence of cardiac tamponade.  4. The mitral valve is normal in structure. No evidence of mitral valve regurgitation. No  evidence of mitral stenosis.  5. The aortic valve is normal in structure. Aortic valve regurgitation is mild. No aortic stenosis is present.  6. Aortic dilatation noted. There is mild dilatation of the aortic root, measuring 40 mm.  7. The inferior vena cava is normal in size with greater than 50% respiratory  variability, suggesting right atrial pressure of 3 mmHg. FINDINGS  Left Ventricle: Left ventricular ejection fraction, by estimation, is 60 to 65%. The left ventricle has normal function. The left ventricle has no regional wall motion abnormalities. Global longitudinal strain performed but not reported based on interpreter judgement due to suboptimal tracking. The left ventricular internal cavity size was normal in size. There is severe concentric left ventricular hypertrophy. Left ventricular diastolic parameters are consistent with Grade I diastolic dysfunction (impaired relaxation). Normal left ventricular filling pressure. Right Ventricle: The right ventricular size is normal. No increase in right ventricular wall thickness. Right ventricular systolic function is normal. There is normal pulmonary artery systolic pressure. The tricuspid regurgitant velocity is 2.60 m/s, and  with an assumed right atrial pressure of 3 mmHg, the estimated right ventricular systolic pressure is 62.9 mmHg. Left Atrium: Left atrial size was normal in size. Right Atrium: Right atrial size was normal in size. Pericardium: A moderately sized pericardial effusion is present. The pericardial effusion is circumferential, localized near the right atrium and anterior to the right ventricle. There is no evidence of cardiac tamponade. Mitral Valve: The mitral valve is normal in structure. No evidence of mitral valve regurgitation. No evidence of mitral valve stenosis. Tricuspid Valve: The tricuspid valve is normal in structure. Tricuspid valve regurgitation is trivial. No evidence of tricuspid stenosis. Aortic Valve: The aortic valve is normal in structure. Aortic valve regurgitation is mild. Aortic regurgitation PHT measures 508 msec. No aortic stenosis is present. Aortic valve peak gradient measures 5.7 mmHg. Pulmonic Valve: The pulmonic valve was normal in structure. Pulmonic valve regurgitation is trivial. No evidence of pulmonic stenosis.  Aorta: Aortic dilatation noted. There is mild dilatation of the aortic root, measuring 40 mm. Venous: The inferior vena cava is normal in size with greater than 50% respiratory variability, suggesting right atrial pressure of 3 mmHg. IAS/Shunts: No atrial level shunt detected by color flow Doppler.  LEFT VENTRICLE PLAX 2D LVIDd:         3.30 cm   Diastology LVIDs:         2.30 cm   LV e' medial:    8.16 cm/s LV PW:         1.70 cm   LV E/e' medial:  8.2 LV IVS:        1.70 cm   LV e' lateral:   9.14 cm/s LVOT diam:     1.80 cm   LV E/e' lateral: 7.3 LV SV:         50 LV SV Index:   33 LVOT Area:     2.54 cm  RIGHT VENTRICLE RV Basal diam:  2.20 cm RV S prime:     14.10 cm/s TAPSE (M-mode): 1.8 cm LEFT ATRIUM           Index        RIGHT ATRIUM          Index LA diam:      2.70 cm 1.78 cm/m   RA Area:     6.99 cm LA Vol (A2C): 14.1 ml 9.28 ml/m   RA Volume:   12.80 ml 8.42 ml/m LA Vol (A4C): 40.0 ml  26.31 ml/m  AORTIC VALVE                 PULMONIC VALVE AV Area (Vmax): 2.33 cm     PV Vmax:       1.09 m/s AV Vmax:        119.00 cm/s  PV Peak grad:  4.8 mmHg AV Peak Grad:   5.7 mmHg LVOT Vmax:      109.00 cm/s LVOT Vmean:     72.800 cm/s LVOT VTI:       0.196 m AI PHT:         508 msec  AORTA Ao Root diam: 4.00 cm Ao Asc diam:  3.40 cm MITRAL VALVE                TRICUSPID VALVE MV Area (PHT): 4.49 cm     TR Peak grad:   27.0 mmHg MV Decel Time: 169 msec     TR Vmax:        260.00 cm/s MV E velocity: 67.10 cm/s MV A velocity: 106.00 cm/s  SHUNTS MV E/A ratio:  0.63         Systemic VTI:  0.20 m                             Systemic Diam: 1.80 cm Fransico Him MD Electronically signed by Fransico Him MD Signature Date/Time: 05/23/2022/1:38:42 PM    Final    NM Pulmonary Perfusion  Result Date: 05/22/2022 CLINICAL DATA:  Pulmonary embolism (PE) suspected, high prob EXAM: NUCLEAR MEDICINE PERFUSION LUNG SCAN TECHNIQUE: Perfusion images were obtained in multiple projections after intravenous injection of  radiopharmaceutical. Ventilation scans intentionally deferred if perfusion scan and chest x-ray adequate for interpretation during COVID 19 epidemic. RADIOPHARMACEUTICALS:  4.37 mCi Tc-24m MAA IV COMPARISON:  Chest x-ray 05/21/2022 FINDINGS: Large segmental perfusion defect of the left upper lobe without corresponding radiographic abnormality. Additional bilateral wedge-shaped subsegmental branch perfusion defects. IMPRESSION: High probability for pulmonary embolism. Electronically Signed   By: Davina Poke D.O.   On: 05/22/2022 10:01   US Venous Img Lower Bilateral  Result Date: 05/21/2022 CLINICAL DATA:  Shortness of breath EXAM: BILATERAL LOWER EXTREMITY VENOUS DOPPLER ULTRASOUND TECHNIQUE: Gray-scale sonography with graded compression, as well as color Doppler and duplex ultrasound were performed to evaluate the lower extremity deep venous systems from the level of the common femoral vein and including the common femoral, femoral, profunda femoral, popliteal and calf veins including the posterior tibial, peroneal and gastrocnemius veins when visible. The superficial great saphenous vein was also interrogated. Spectral Doppler was utilized to evaluate flow at rest and with distal augmentation maneuvers in the common femoral, femoral and popliteal veins. COMPARISON:  None Available. FINDINGS: RIGHT LOWER EXTREMITY Common Femoral Vein: No evidence of thrombus. Normal compressibility, respiratory phasicity and response to augmentation. Saphenofemoral Junction: No evidence of thrombus. Normal compressibility and flow on color Doppler imaging. Profunda Femoral Vein: No evidence of thrombus. Normal compressibility and flow on color Doppler imaging. Femoral Vein: No evidence of thrombus. Normal compressibility, respiratory phasicity and response to augmentation. Popliteal Vein: No evidence of thrombus. Normal compressibility, respiratory phasicity and response to augmentation. Calf Veins: No evidence of  thrombus. Normal compressibility and flow on color Doppler imaging. Superficial Great Saphenous Vein: No evidence of thrombus. Normal compressibility. Venous Reflux:  None. Other Findings:  None. LEFT LOWER EXTREMITY Common Femoral Vein: No evidence of thrombus. Normal compressibility, respiratory phasicity and response to  augmentation. Saphenofemoral Junction: No evidence of thrombus. Normal compressibility and flow on color Doppler imaging. Profunda Femoral Vein: No evidence of thrombus. Normal compressibility and flow on color Doppler imaging. Femoral Vein: No evidence of thrombus. Normal compressibility, respiratory phasicity and response to augmentation. Popliteal Vein: No evidence of thrombus. Normal compressibility, respiratory phasicity and response to augmentation. Calf Veins: No evidence of thrombus. Normal compressibility and flow on color Doppler imaging. Superficial Great Saphenous Vein: No evidence of thrombus. Normal compressibility. Venous Reflux:  None. Other Findings:  None. IMPRESSION: No evidence of deep venous thrombosis in either lower extremity. Electronically Signed   By: Inez Catalina M.D.   On: 05/21/2022 22:37   DG Chest 2 View  Result Date: 05/21/2022 CLINICAL DATA:  sob EXAM: CHEST - 2 VIEW COMPARISON:  December 30, 2015, February 26, 2014 FINDINGS: Evaluation is limited by rotation. The cardiomediastinal silhouette is unchanged and enlarged in contour. Tortuous thoracic aorta. No pleural effusion. No pneumothorax. No acute pleuroparenchymal abnormality. Visualized abdomen is unremarkable. Multilevel degenerative changes of the thoracic spine. IMPRESSION: No acute cardiopulmonary abnormality. Electronically Signed   By: Valentino Saxon M.D.   On: 05/21/2022 16:40             LOS: 3 days   Time spent: 50 mins    Emeterio Reeve, DO [facility] 05/25/2022, 10:28 AM    Dictation software may have been used to generate the above note. Typos may occur and  escape review in typed/dictated notes. Please contact Dr Sheppard Coil directly for clarity if needed.  Staff may message me via secure chat in [place]  but this may not receive an immediate response,  please page me for urgent matters!  If 7PM-7AM, please contact night coverage [email or web address]

## 2022-05-25 NOTE — Consult Note (Signed)
ANTICOAGULATION CONSULT NOTE   Pharmacy Consult for heparin infusion Indication: pulmonary embolus  Allergies  Allergen Reactions   Ivp Dye [Iodinated Contrast Media]    Shellfish Allergy Other (See Comments)    Reaction: unknown     Patient Measurements: Height: 5\' 1"  (154.9 cm) Weight: 54.4 kg (120 lb) IBW/kg (Calculated) : 47.8 Heparin Dosing Weight: 54.4 kg  Vital Signs: Temp: 97.6 F (36.4 C) (01/30 0034) Temp Source: Oral (01/29 1955) BP: 114/74 (01/30 0034) Pulse Rate: 87 (01/30 0034)  Labs: Recent Labs    05/22/22 1038 05/22/22 1904 05/23/22 0405 05/23/22 1131 05/24/22 0538 05/24/22 0909 05/24/22 1914 05/25/22 0247  HGB  --    < > 9.2*  --  10.6*  --   --  10.3*  HCT  --    < > 29.5*  --  33.8*  --   --  32.6*  PLT  --    < > 438*  --  471*  --   --  445*  APTT 36  --   --   --   --   --   --   --   LABPROT 14.3  --   --   --   --   --   --   --   INR 1.1  --   --   --   --   --   --   --   HEPARINUNFRC  --    < > 0.41   < >  --  0.27* 0.42 0.55  CREATININE  --   --  0.83  --  0.98  --   --  0.94   < > = values in this interval not displayed.     Estimated Creatinine Clearance: 35.4 mL/min (by C-G formula based on SCr of 0.94 mg/dL).   Medical History: Past Medical History:  Diagnosis Date   Chronic kidney disease, stage 3a ([place])    Diabetes mellitus without complication ([place])    HTN (hypertension)    Hypertension    Type II diabetes mellitus with renal manifestations ([place])     Medications:  NO AC prior to admission noted  Assessment: Patient admitted with shortness of breath. PMH includes diabetes, hypertension, and chronic kidney disease stage III. Pharmacy consulted to manage heparin infusion for new dx PE. CBC stable.    Goal of Therapy:  Heparin level 0.3-0.7 units/ml Monitor platelets by anticoagulation protocol: Yes   Date Time HL Rate/Comment 1/27 1904 0.26 Subtherapeutic @ 700 un/hr 1/28 0405 0.41 therapeutic x  1 1/28 1131 0.26 Subtherapeutic,  inc from 800 to 900 u/hr 1/29 0041 0.34 Therapeutic x 1 1/29 0909 0.27 Subtherapeutic  1/29 1914 0.42 Therapeutic x 1 1/30 0247 0.55 Therapeutic x 2  Plan:  Heparin remains therapeutic - Will continue heparin infusion at 1000 units/hr.  - Recheck heparin lev with AM labs - CBC daily while on heparin.    Dorothe Pea, PharmD, BCPS Clinical Pharmacist   05/25/2022 3:47 AM

## 2022-05-26 DIAGNOSIS — C3490 Malignant neoplasm of unspecified part of unspecified bronchus or lung: Secondary | ICD-10-CM

## 2022-05-26 DIAGNOSIS — R0602 Shortness of breath: Secondary | ICD-10-CM | POA: Diagnosis not present

## 2022-05-26 LAB — BASIC METABOLIC PANEL
Anion gap: 7 (ref 5–15)
BUN: 19 mg/dL (ref 8–23)
CO2: 29 mmol/L (ref 22–32)
Calcium: 8.8 mg/dL — ABNORMAL LOW (ref 8.9–10.3)
Chloride: 100 mmol/L (ref 98–111)
Creatinine, Ser: 1 mg/dL (ref 0.44–1.00)
GFR, Estimated: 57 mL/min — ABNORMAL LOW (ref >60)
Glucose, Bld: 121 mg/dL — ABNORMAL HIGH (ref 70–99)
Potassium: 3.6 mmol/L (ref 3.5–5.1)
Sodium: 136 mmol/L (ref 135–145)

## 2022-05-26 LAB — CBC
HCT: 31.1 % — ABNORMAL LOW (ref 36.0–46.0)
Hemoglobin: 9.9 g/dL — ABNORMAL LOW (ref 12.0–15.0)
MCH: 26.8 pg (ref 26.0–34.0)
MCHC: 31.8 g/dL (ref 30.0–36.0)
MCV: 84.1 fL (ref 80.0–100.0)
Platelets: 405 10*3/uL — ABNORMAL HIGH (ref 150–400)
RBC: 3.7 MIL/uL — ABNORMAL LOW (ref 3.87–5.11)
RDW: 16.1 % — ABNORMAL HIGH (ref 11.5–15.5)
WBC: 15.1 10*3/uL — ABNORMAL HIGH (ref 4.0–10.5)
nRBC: 0 % (ref 0.0–0.2)

## 2022-05-26 LAB — GLUCOSE, CAPILLARY
Glucose-Capillary: 112 mg/dL — ABNORMAL HIGH (ref 70–99)
Glucose-Capillary: 122 mg/dL — ABNORMAL HIGH (ref 70–99)
Glucose-Capillary: 109 mg/dL — ABNORMAL HIGH (ref 70–99)
Glucose-Capillary: 150 mg/dL — ABNORMAL HIGH (ref 70–99)

## 2022-05-26 NOTE — Progress Notes (Signed)
Occupational Therapy Treatment Patient Details Name: Regina Perkins MRN: [id number] DOB: 06-10-1940 Today's Date: 05/26/2022   History of present illness Regina Perkins is a 82 y.o. female with medical history significant of hypertension, diabetes mellitus, PVD, CKD-3A, tobacco abuse, who presents with shortness breath; admitted for management of acute PE, managed with heparin drip.   OT comments  Upon entering the room, pt seated in recliner chair and requesting assistance to transfer to bed. She is pleasant but reports being very fatigued and declines additional ADLs and mobility tasks. Pt stands from recliner with supervision and takes several steps without AD or assist to bed. Pt's O2 remains at 93% or better while on RA. She has new Dx of lung CA. Pt would benefit from energy conservation education next session. All needs within reach.   Recommendations for follow up therapy are one component of a multi-disciplinary discharge planning process, led by the attending physician.  Recommendations may be updated based on patient status, additional functional criteria and insurance authorization.    Follow Up Recommendations  Home health OT     Assistance Recommended at Discharge Intermittent Supervision/Assistance  Patient can return home with the following  A little help with bathing/dressing/bathroom;Assistance with cooking/housework;Help with stairs or ramp for entrance;Assist for transportation   Equipment Recommendations  None recommended by OT       Precautions / Restrictions Precautions Precautions: Fall       Mobility Bed Mobility Overal bed mobility: Modified Independent             General bed mobility comments: increased time to complete task    Transfers Overall transfer level: Needs assistance Equipment used: None Transfers: Sit to/from Stand, Bed to chair/wheelchair/BSC Sit to Stand: Supervision     Step pivot transfers: Supervision           Balance  Overall balance assessment: Needs assistance Sitting-balance support: No upper extremity supported Sitting balance-Leahy Scale: Good     Standing balance support: No upper extremity supported, During functional activity Standing balance-Leahy Scale: Fair                             ADL either performed or assessed with clinical judgement    Extremity/Trunk Assessment Upper Extremity Assessment Upper Extremity Assessment: Generalized weakness   Lower Extremity Assessment Lower Extremity Assessment: Generalized weakness        Vision Patient Visual Report: No change from baseline            Cognition Arousal/Alertness: Awake/alert Behavior During Therapy: WFL for tasks assessed/performed Overall Cognitive Status: Within Functional Limits for tasks assessed                                 General Comments: pleasant and cooperative                   Pertinent Vitals/ Pain       Pain Assessment Pain Assessment: No/denies pain         Frequency  Min 2X/week        Progress Toward Goals  OT Goals(current goals can now be found in the care plan section)  Progress towards OT goals: Progressing toward goals  Acute Rehab OT Goals Patient Stated Goal: to go home and feel better OT Goal Formulation: With patient Time For Goal Achievement: 06/07/22 Potential to Achieve Goals: Good  Plan Discharge  plan remains appropriate;Frequency remains appropriate       AM-PAC OT "6 Clicks" Daily Activity     Outcome Measure   Help from another person eating meals?: None Help from another person taking care of personal grooming?: None Help from another person toileting, which includes using toliet, bedpan, or urinal?: A Little Help from another person bathing (including washing, rinsing, drying)?: A Little Help from another person to put on and taking off regular upper body clothing?: None Help from another person to put on and taking off  regular lower body clothing?: A Little 6 Click Score: 21    End of Session    OT Visit Diagnosis: Muscle weakness (generalized) (M62.81);Unsteadiness on feet (R26.81)   Activity Tolerance Patient limited by fatigue   Patient Left in bed;with call bell/phone within reach;with bed alarm set   Nurse Communication Mobility status        Time: 1359-1410 OT Time Calculation (min): 11 min  Charges: OT General Charges $OT Visit: 1 Visit OT Treatments $Therapeutic Activity: 8-22 mins  Darleen Crocker, MS, OTR/L , CBIS ascom [phone]  05/26/22, 3:06 PM

## 2022-05-26 NOTE — Progress Notes (Addendum)
PT Cancellation Note  Patient Details Name: Regina Perkins MRN: [id number] DOB: [birth year]   Cancelled Treatment:     PT attempt. Pt is in bed reporting she has already been up ambulating with RN staff. Confirmed with RN tech that she was able to ambulate without O2 into hallway. Pt wanting to DC home at DC. She politely requested not to ambulate or have PT at this time. " I just returned to bed." Pt states she is planning to DC home when cleared medically.    Willette Pa 05/26/2022, 2:19 PM

## 2022-05-26 NOTE — Progress Notes (Signed)
PROGRESS NOTE  DWAYNE BEGAY   HEN:[id number] DOB: [birth year]  DOA: 05/21/2022 Date of Service: 05/26/22 PCP: [facility] [place]  Brief Narrative / Hospital Course:  Regina Perkins is a 82 y.o. female with medical history significant of hypertension, diabetes mellitus, PVD, CKD-3A, tobacco abuse, who presents to ED via EMS from home 05/21/22 with shortness breath x1 day.  01/26: in ED, oxygen saturation 86% on room air, which improved to 94% on 2 L oxygen. Pt reports IV contrast allergy, (+)D-dimer, CXR neg, Tropes neg, plan VQ scan 01/27: VQ done, high probability PE (Large segmental perfusion defect of the left upper lobe without corresponding radiographic abnormality. Additional bilateral wedge-shaped subsegmental branch perfusion defects"). [place] neg DVT. Admitted to hospitalist service on IV heparin and supplemental O2.  01/28: Echo no RV strain, (+)moderate pericardial effusion, LVH. Pt noting dyspnea, will keep overnight on O2 and remain on heparin  01/29: Normal SpO2 but significant increased WOB and SOB with minimal exertion.  Kept on heparin.  CXR concerning for LLL opacity and noted potential for pulmonary hemorrhage. D/w patient and with vascular surgery, Dr. Lucky Cowboy, we will move forward with CTA chest, premedicating for contrast allergy.  Signed this out to [facility] night team 01/30: CT(+) likely lung cancer. D/w patient. Consults placed to medical oncology re: further workup/tx. Pulm consult for possible bronch.   Consultants:  Medical Oncology  Pulmonology for bronchoscopy   Procedures: none  ASSESSMENT & PLAN:   Principal Problem:   Abnormal CT chest likely malignancy likely pulmonary  Active Problems:   Essential hypertension   Chronic kidney disease, stage 3a (HCC)   Type II diabetes mellitus with renal manifestations ([place])   Hypokalemia   Hypomagnesemia   Tobacco abuse    Acute pulmonary embolism (HCC) - RULED OUT  Abnormal CT chest likely malignancy  likely pulmonary  Acute hypoxic respiratory failure d/t PE - improved, to RA. 2D echocardiogram --> no R heart strain Oncology consult - Dr Tasia Catchings CT abd/pelvis Pulmonary consult for bronch - Dr Lanney Gins Supplemental O2 as needed prn albuterol nebs and mucinex    Pericardial effusion Spoke w/ Dr Radford Pax, cardiology, can follow outpatient   Essential hypertension- well controlled Amlodipine, lisinopril   Chronic kidney disease, stage 3a ([place]): baseline Follow-up with BMP   Type II diabetes mellitus with renal manifestations ([place]): A1c 7.1 on admission SSI   Hypokalemia and hypomagnesemia on admission- resolved Repleted potassium and magnesium PRN  DVT prophylaxis: lovenox Pertinent IV fluids/nutrition: no continuous IV fluids Central lines / invasive devices: none  Code Status: FULL CODE  Current Admission Status: inpatient   TOC needs / Dispo plan: none at this time Barriers to discharge / significant pending items: dyspnea, pending oncology, pulmonology clearance    Subjective / Brief ROS:  Patient reports feeling well. She has no SOB at rest. Continues to get short of breath when she talks. She is taking the news of her new diagnosis very well. She is agreeable to bronchoscopy.   Family Communication: none at baseline  Objective Findings:  Vitals:   05/25/22 1934 05/25/22 2330 05/26/22 0402 05/26/22 0705  BP: (!) 153/82 139/72 129/67 125/69  Pulse: 100 97 95 92  Resp: 17 17 20 20   Temp: 98 F (36.7 C) 98.4 F (36.9 C) 98.3 F (36.8 C) 98.6 F (37 C)  TempSrc: Oral     SpO2: 94% 93% 100% 96%  Weight:      Height:        Intake/Output  Summary (Last 24 hours) at 05/26/2022 0804 Last data filed at 05/26/2022 0358 Gross per 24 hour  Intake 2540 ml  Output 650 ml  Net 1890 ml    Filed Weights   05/22/22 1028  Weight: 54.4 kg    Examination:  Physical Exam Constitutional:      General: She is not in acute distress. Cardiovascular:     Rate and Rhythm:  Normal rate and regular rhythm.  Pulmonary:     Effort: Pulmonary effort is normal. No tachypnea (becomes tachypneic on exertion).     Breath sounds: No decreased breath sounds.  Musculoskeletal:        General: Normal range of motion.     Right lower leg: No edema.     Left lower leg: No edema.  Skin:    General: Skin is warm and dry.  Neurological:     General: No focal deficit present.     Mental Status: She is alert and oriented to person, place, and time.  Psychiatric:        Mood and Affect: Mood normal.        Behavior: Behavior normal.    Scheduled Medications:   amitriptyline  25 mg Oral q morning   amLODipine  10 mg Oral Daily   enoxaparin (LOVENOX) injection  40 mg Subcutaneous Q24H   insulin aspart  0-5 Units Subcutaneous QHS   insulin aspart  0-9 Units Subcutaneous TID WC   lisinopril  40 mg Oral Daily   nicotine  21 mg Transdermal Daily   Continuous Infusions:  ceFEPime (MAXIPIME) IV Stopped (05/25/22 2236)   PRN Medications:  acetaminophen, albuterol, dextromethorphan-guaiFENesin, hydrALAZINE, ondansetron (ZOFRAN) IV  Antimicrobials from admission:  Anti-infectives (From admission, onward)    Start     Dose/Rate Route Frequency Ordered Stop   05/25/22 0800  ceFEPIme (MAXIPIME) 2 g in sodium chloride 0.9 % 100 mL IVPB        2 g 200 mL/hr over 30 Minutes Intravenous Every 12 hours 05/25/22 0709        Data Reviewed:  I have personally reviewed the following.  CBC: Recent Labs  Lab 05/22/22 1904 05/23/22 0405 05/24/22 0538 05/25/22 0247 05/26/22 0130  WBC 9.8 9.3 9.7 8.7 15.1*  HGB 9.2* 9.2* 10.6* 10.3* 9.9*  HCT 29.4* 29.5* 33.8* 32.6* 31.1*  MCV 85.7 85.5 86.2 84.5 84.1  PLT 441* 438* 471* 445* 405*    Basic Metabolic Panel: Recent Labs  Lab 05/21/22 1621 05/22/22 1037 05/22/22 1038 05/23/22 0405 05/24/22 0538 05/25/22 0247 05/26/22 0130  NA 136  --   --  136 139 133* 136  K 3.3*  --   --  3.6 4.1 4.4 3.6  CL 93*  --   --  98 99  96* 100  CO2 30  --   --  29 30 30 29   GLUCOSE 176*  --   --  102* 118* 185* 121*  BUN 27*  --   --  17 13 15 19   CREATININE 1.18*  --   --  0.83 0.98 0.94 1.00  CALCIUM 9.5  --   --  8.7* 9.0 9.0 8.8*  MG  --   --  1.4* 1.6*  --   --   --   PHOS  --  3.0  --   --   --   --   --     Recent Labs  Lab 05/22/22 1038  INR 1.1    CBG: Recent Labs  Lab 05/25/22 0746 05/25/22 1207 05/25/22 1614 05/25/22 2128 05/26/22 0751  GLUCAP 139* 196* 105* 151* 112*    Most Recent Urinalysis On File:     Component Value Date/Time   COLORURINE STRAW (A) 12/30/2015 1300   APPEARANCEUR CLEAR (A) 12/30/2015 1300   APPEARANCEUR Cloudy 01/16/2012 1937   LABSPEC 1.005 12/30/2015 1300   LABSPEC 1.028 01/16/2012 1937   PHURINE 7.0 12/30/2015 1300   GLUCOSEU >500 (A) 12/30/2015 1300   GLUCOSEU Negative 01/16/2012 1937   HGBUR 2+ (A) 12/30/2015 1300   BILIRUBINUR NEGATIVE 12/30/2015 1300   BILIRUBINUR Negative 01/16/2012 1937   KETONESUR 1+ (A) 12/30/2015 1300   PROTEINUR 30 (A) 12/30/2015 1300   NITRITE NEGATIVE 12/30/2015 1300   LEUKOCYTESUR NEGATIVE 12/30/2015 1300   LEUKOCYTESUR Negative 01/16/2012 1937   Sepsis Labs: @LABRCNTIP (procalcitonin:4,lacticidven:4) Microbiology: Recent Results (from the past 240 hour(s))  Resp Panel by RT-PCR (Flu A&B, Covid) Anterior Nasal Swab     Status: None   Collection Time: 05/21/22  8:52 PM   Specimen: Anterior Nasal Swab  Result Value Ref Range Status   SARS Coronavirus 2 by RT PCR NEGATIVE NEGATIVE Final    Comment: (NOTE) SARS-CoV-2 target nucleic acids are NOT DETECTED.  The SARS-CoV-2 RNA is generally detectable in upper respiratory specimens during the acute phase of infection. The lowest concentration of SARS-CoV-2 viral copies this assay can detect is 138 copies/mL. A negative result does not preclude SARS-Cov-2 infection and should not be used as the sole basis for treatment or other patient management decisions. A negative result may  occur with  improper specimen collection/handling, submission of specimen other than nasopharyngeal swab, presence of viral mutation(s) within the areas targeted by this assay, and inadequate number of viral copies(<138 copies/mL). A negative result must be combined with clinical observations, patient history, and epidemiological information. The expected result is Negative.  Fact Sheet for Patients:  EntrepreneurPulse.com.au  Fact Sheet for Healthcare Providers:  IncredibleEmployment.be  This test is no t yet approved or cleared by the [place] FDA and  has been authorized for detection and/or diagnosis of SARS-CoV-2 by FDA under an Emergency Use Authorization (EUA). This EUA will remain  in effect (meaning this test can be used) for the duration of the COVID-19 declaration under Section 564(b)(1) of the Act, 21 U.S.C.section 360bbb-3(b)(1), unless the authorization is terminated  or revoked sooner.       Influenza A by PCR NEGATIVE NEGATIVE Final   Influenza B by PCR NEGATIVE NEGATIVE Final    Comment: (NOTE) The Xpert Xpress SARS-CoV-2/FLU/RSV plus assay is intended as an aid in the diagnosis of influenza from Nasopharyngeal swab specimens and should not be used as a sole basis for treatment. Nasal washings and aspirates are unacceptable for Xpert Xpress SARS-CoV-2/FLU/RSV testing.  Fact Sheet for Patients: EntrepreneurPulse.com.au  Fact Sheet for Healthcare Providers: IncredibleEmployment.be  This test is not yet approved or cleared by the [place] FDA and has been authorized for detection and/or diagnosis of SARS-CoV-2 by FDA under an Emergency Use Authorization (EUA). This EUA will remain in effect (meaning this test can be used) for the duration of the COVID-19 declaration under Section 564(b)(1) of the Act, 21 U.S.C. section 360bbb-3(b)(1), unless the authorization is terminated  or revoked.  Performed at [facility], [address] [place], [place] [address]   MRSA Next Gen by PCR, Nasal     Status: None   Collection Time: 05/25/22  3:38 AM   Specimen: Nasal Mucosa; Nasal Swab  Result  Value Ref Range Status   MRSA by PCR Next Gen NOT DETECTED NOT DETECTED Final    Comment: (NOTE) The GeneXpert MRSA Assay (FDA approved for NASAL specimens only), is one component of a comprehensive MRSA colonization surveillance program. It is not intended to diagnose MRSA infection nor to guide or monitor treatment for MRSA infections. Test performance is not FDA approved in patients less than 41 years old. Performed at [facility], [address], [place], [place] [address]     Radiology Studies last 3 days: CT ABDOMEN PELVIS WO CONTRAST  Result Date: 05/25/2022 CLINICAL DATA:  Unintentional weight loss. EXAM: CT ABDOMEN AND PELVIS WITHOUT CONTRAST TECHNIQUE: Multidetector CT imaging of the abdomen and pelvis was performed following the standard protocol without IV contrast. RADIATION DOSE REDUCTION: This exam was performed according to the departmental dose-optimization program which includes automated exposure control, adjustment of the mA and/or kV according to patient size and/or use of iterative reconstruction technique. COMPARISON:  Chest CTA 05/24/2022.  Abdominal CT 12/30/2015. FINDINGS: Lower chest: 6 mm right middle lobe pulmonary nodule on image 8/4 is unchanged from recent chest CT. Left pleural effusion and moderate pericardial effusion are also unchanged. Hepatobiliary: No suspicious liver lesions are demonstrated on noncontrast imaging. Well-circumscribed low-density lesions in the left hepatic lobe are stable and consistent with cysts. There is a small calcified gallstone. No evidence of gallbladder wall thickening or biliary dilatation. Pancreas: Unremarkable. No pancreatic ductal dilatation or surrounding inflammatory changes. Spleen: Normal  in size without focal abnormality. Adrenals/Urinary Tract: Both adrenal glands appear normal. Punctate nonobstructing calculus in the interpolar region of the left kidney. There is contrast material within the renal collecting systems bilaterally attributed to recent chest CTA. No hydronephrosis or perinephric soft tissue stranding. The bladder appears unremarkable for its degree of distention. Stomach/Bowel: Enteric contrast was administered and has passed into the colon. The stomach appears unremarkable for its degree of distension. No evidence of bowel wall thickening, distention or surrounding inflammatory change. Moderate stool throughout the colon. Vascular/Lymphatic: There are no enlarged abdominal or pelvic lymph nodes. Portions of the retroperitoneum are partly obscured by artifact from lumbar spinal hardware. There is aortic and branch vessel atherosclerosis without evidence of aneurysm. Reproductive: Hysterectomy.  No evidence of adnexal mass. Other: Small umbilical hernia containing fat and a small amount of fluid. No evidence of peritoneal nodularity. Musculoskeletal: No acute or significant osseous findings. Previous lower lumbar fusion with mild multilevel spondylosis. Mild degenerative changes of both hips. IMPRESSION: 1. No acute findings or explanation for the patient's symptoms. 2. No evidence of primary malignancy or metastatic disease within the abdomen or pelvis on noncontrast imaging. Please refer to recent chest CT demonstrating findings highly worrisome for thoracic malignancy. 3. Cholelithiasis without evidence of cholecystitis or biliary dilatation. 4. Nonobstructing left renal calculus. 5. Moderate stool throughout the colon suggesting constipation. 6.  Aortic Atherosclerosis (ICD10-I70.0). Electronically Signed   By: Richardean Sale M.D.   On: 05/25/2022 18:31   CT Angio Chest Pulmonary Embolism (PE) W or WO Contrast  Result Date: 05/24/2022 CLINICAL DATA:  Abnormal x-ray.  Known PE  on V/Q scan. EXAM: CT ANGIOGRAPHY CHEST WITH CONTRAST TECHNIQUE: Multidetector CT imaging of the chest was performed using the standard protocol during bolus administration of intravenous contrast. Multiplanar CT image reconstructions and MIPs were obtained to evaluate the vascular anatomy. RADIATION DOSE REDUCTION: This exam was performed according to the departmental dose-optimization program which includes automated exposure control, adjustment of the mA and/or kV according to  patient size and/or use of iterative reconstruction technique. CONTRAST:  53mL OMNIPAQUE IOHEXOL 350 MG/ML SOLN COMPARISON:  Chest x-ray 05/24/2022. Nuclear medicine perfusion lung scan 05/22/2022. FINDINGS: Cardiovascular: There is adequate opacification of the pulmonary arteries to the segmental level. There is no evidence for pulmonary embolism. The aorta is ectatic without focal dilatation. Heart is normal in size. There is a moderate-sized pericardial effusion. There are atherosclerotic calcifications of the aorta. Mediastinum/Nodes: Abnormal masslike soft tissue density in the prevascular space identified extending into the left upper lobe. This measures 4.9 x 3.5 x 4.4 cm. There is cutoff of bronchi in this region. There is an enlarged subcarinal lymph node measuring 13 mm short axis. There is an enlarged right hilar lymph node measuring 15 mm short axis. There is an enlarged left hilar lymph node measuring 12 mm short axis. The stomach is mildly distended with fluid. The visualized thyroid gland is within normal limits. Lungs/Pleura: There are additional bilateral pulmonary nodules measuring up to 11 mm (image 5/71 in the right lower lobe) there also some ill-defined patchy ground-glass and airspace opacities in the left upper lobe. There is a small left pleural effusion with compressive atelectasis of the left lower lobe. Secretions or other material noted within the left lower lobe bronchus image 5/60. Trachea and airways are  otherwise patent. Upper Abdomen: Gallstones are present. There are rounded hypodense areas within the liver measuring up to 19 mm favored as cysts. There are atherosclerotic calcifications of the aorta. There is a punctate nonobstructing left renal calculus. Musculoskeletal: No chest wall abnormality. No acute or significant osseous findings. Review of the MIP images confirms the above findings. IMPRESSION: 1. No evidence for pulmonary embolism. 2. Left upper lobe/prevascular mass worrisome for neoplasm. 3. Mediastinal and hilar lymphadenopathy. 4. Multiple pulmonary nodules measuring up to 11 mm worrisome for metastatic disease. 5. Small left pleural effusion. 6. Patchy ground-glass and airspace opacities in the left upper lobe worrisome for infection. 7. Moderate-sized pericardial effusion. 8. Cholelithiasis. 9. Nonobstructing left renal calculus. Aortic Atherosclerosis (ICD10-I70.0). Electronically Signed   By: Ronney Asters M.D.   On: 05/24/2022 19:58   DG Chest Port 1 View  Result Date: 05/24/2022 CLINICAL DATA:  Shortness of breath and abnormal lung sounds. High probability perfusion lung scan on 05/22/2022. EXAM: PORTABLE CHEST 1 VIEW COMPARISON:  05/21/2022 FINDINGS: New airspace opacity/consolidation in the left lower lobe. Mild central interstitial accentuation and airway thickening in the lungs. Mild enlargement of the cardiopericardial silhouette. IMPRESSION: 1. New airspace opacity/consolidation in the left lower lobe, suspicious for pneumonia although pulmonary hemorrhage can have. 2. Mild enlargement of the cardiopericardial silhouette. A similar appearance 3. Airway thickening and interstitial accentuation in the lungs favors bronchitis or reactive airways disease. Electronically Signed   By: Van Clines M.D.   On: 05/24/2022 12:40   ECHOCARDIOGRAM COMPLETE  Result Date: 05/23/2022    ECHOCARDIOGRAM REPORT   Patient Name:   Regina Perkins Date of Exam: 05/23/2022 Medical Rec #:   [id number]      Height:       61.0 in Accession #:    [id number]     Weight:       120.0 lb Date of Birth:  February 05, 1941      BSA:          1.520 m Patient Age:    31 years       BP:           148/77 mmHg Patient Gender: F  HR:           90 bpm. Exam Location:  [facility] Procedure: 2D Echo and Strain Analysis Indications:     Pulmonary Embolus I26.09  History:         Patient has no prior history of Echocardiogram examinations.  Sonographer:     Kathlen Brunswick RDCS Referring Phys:  [id number] Soledad Gerlach NIU Diagnosing Phys: Fransico Him MD  Sonographer Comments: Global longitudinal strain was attempted. IMPRESSIONS  1. Left ventricular ejection fraction, by estimation, is 60 to 65%. The left ventricle has normal function. The left ventricle has no regional wall motion abnormalities. There is severe concentric left ventricular hypertrophy. Left ventricular diastolic  parameters are consistent with Grade I diastolic dysfunction (impaired relaxation).  2. Right ventricular systolic function is normal. The right ventricular size is normal. There is normal pulmonary artery systolic pressure. The estimated right ventricular systolic pressure is 74.2 mmHg.  3. Moderate pericardial effusion. The pericardial effusion is circumferential, localized near the right atrium and anterior to the right ventricle. There is no evidence of cardiac tamponade.  4. The mitral valve is normal in structure. No evidence of mitral valve regurgitation. No evidence of mitral stenosis.  5. The aortic valve is normal in structure. Aortic valve regurgitation is mild. No aortic stenosis is present.  6. Aortic dilatation noted. There is mild dilatation of the aortic root, measuring 40 mm.  7. The inferior vena cava is normal in size with greater than 50% respiratory variability, suggesting right atrial pressure of 3 mmHg. FINDINGS  Left Ventricle: Left ventricular ejection fraction, by estimation, is 60 to 65%. The left ventricle has normal function. The  left ventricle has no regional wall motion abnormalities. Global longitudinal strain performed but not reported based on interpreter judgement due to suboptimal tracking. The left ventricular internal cavity size was normal in size. There is severe concentric left ventricular hypertrophy. Left ventricular diastolic parameters are consistent with Grade I diastolic dysfunction (impaired relaxation). Normal left ventricular filling pressure. Right Ventricle: The right ventricular size is normal. No increase in right ventricular wall thickness. Right ventricular systolic function is normal. There is normal pulmonary artery systolic pressure. The tricuspid regurgitant velocity is 2.60 m/s, and  with an assumed right atrial pressure of 3 mmHg, the estimated right ventricular systolic pressure is 59.5 mmHg. Left Atrium: Left atrial size was normal in size. Right Atrium: Right atrial size was normal in size. Pericardium: A moderately sized pericardial effusion is present. The pericardial effusion is circumferential, localized near the right atrium and anterior to the right ventricle. There is no evidence of cardiac tamponade. Mitral Valve: The mitral valve is normal in structure. No evidence of mitral valve regurgitation. No evidence of mitral valve stenosis. Tricuspid Valve: The tricuspid valve is normal in structure. Tricuspid valve regurgitation is trivial. No evidence of tricuspid stenosis. Aortic Valve: The aortic valve is normal in structure. Aortic valve regurgitation is mild. Aortic regurgitation PHT measures 508 msec. No aortic stenosis is present. Aortic valve peak gradient measures 5.7 mmHg. Pulmonic Valve: The pulmonic valve was normal in structure. Pulmonic valve regurgitation is trivial. No evidence of pulmonic stenosis. Aorta: Aortic dilatation noted. There is mild dilatation of the aortic root, measuring 40 mm. Venous: The inferior vena cava is normal in size with greater than 50% respiratory variability,  suggesting right atrial pressure of 3 mmHg. IAS/Shunts: No atrial level shunt detected by color flow Doppler.  LEFT VENTRICLE PLAX 2D LVIDd:         3.30  cm   Diastology LVIDs:         2.30 cm   LV e' medial:    8.16 cm/s LV PW:         1.70 cm   LV E/e' medial:  8.2 LV IVS:        1.70 cm   LV e' lateral:   9.14 cm/s LVOT diam:     1.80 cm   LV E/e' lateral: 7.3 LV SV:         50 LV SV Index:   33 LVOT Area:     2.54 cm  RIGHT VENTRICLE RV Basal diam:  2.20 cm RV S prime:     14.10 cm/s TAPSE (M-mode): 1.8 cm LEFT ATRIUM           Index        RIGHT ATRIUM          Index LA diam:      2.70 cm 1.78 cm/m   RA Area:     6.99 cm LA Vol (A2C): 14.1 ml 9.28 ml/m   RA Volume:   12.80 ml 8.42 ml/m LA Vol (A4C): 40.0 ml 26.31 ml/m  AORTIC VALVE                 PULMONIC VALVE AV Area (Vmax): 2.33 cm     PV Vmax:       1.09 m/s AV Vmax:        119.00 cm/s  PV Peak grad:  4.8 mmHg AV Peak Grad:   5.7 mmHg LVOT Vmax:      109.00 cm/s LVOT Vmean:     72.800 cm/s LVOT VTI:       0.196 m AI PHT:         508 msec  AORTA Ao Root diam: 4.00 cm Ao Asc diam:  3.40 cm MITRAL VALVE                TRICUSPID VALVE MV Area (PHT): 4.49 cm     TR Peak grad:   27.0 mmHg MV Decel Time: 169 msec     TR Vmax:        260.00 cm/s MV E velocity: 67.10 cm/s MV A velocity: 106.00 cm/s  SHUNTS MV E/A ratio:  0.63         Systemic VTI:  0.20 m                             Systemic Diam: 1.80 cm Fransico Him MD Electronically signed by Fransico Him MD Signature Date/Time: 05/23/2022/1:38:42 PM    Final    NM Pulmonary Perfusion  Result Date: 05/22/2022 CLINICAL DATA:  Pulmonary embolism (PE) suspected, high prob EXAM: NUCLEAR MEDICINE PERFUSION LUNG SCAN TECHNIQUE: Perfusion images were obtained in multiple projections after intravenous injection of radiopharmaceutical. Ventilation scans intentionally deferred if perfusion scan and chest x-ray adequate for interpretation during COVID 19 epidemic. RADIOPHARMACEUTICALS:  4.37 mCi Tc-17m MAA IV  COMPARISON:  Chest x-ray 05/21/2022 FINDINGS: Large segmental perfusion defect of the left upper lobe without corresponding radiographic abnormality. Additional bilateral wedge-shaped subsegmental branch perfusion defects. IMPRESSION: High probability for pulmonary embolism. Electronically Signed   By: Davina Poke D.O.   On: 05/22/2022 10:01      LOS: 4 days   Time spent: 50 mins    Richarda Osmond, DO [facility] 05/26/2022, 8:04 AM   Staff may message me via secure chat in [place]  but this may not receive an immediate response,  please page me for urgent matters!  If 7PM-7AM, please contact night coverage [email or web address]

## 2022-05-27 ENCOUNTER — Other Ambulatory Visit: Payer: Self-pay | Admitting: Student in an Organized Health Care Education/Training Program

## 2022-05-27 DIAGNOSIS — I3139 Other pericardial effusion (noninflammatory): Secondary | ICD-10-CM

## 2022-05-27 HISTORY — DX: Other pericardial effusion (noninflammatory): I31.39

## 2022-05-27 LAB — BASIC METABOLIC PANEL
Anion gap: 6 (ref 5–15)
BUN: 17 mg/dL (ref 8–23)
CO2: 31 mmol/L (ref 22–32)
Calcium: 8.9 mg/dL (ref 8.9–10.3)
Chloride: 101 mmol/L (ref 98–111)
Creatinine, Ser: 0.84 mg/dL (ref 0.44–1.00)
GFR, Estimated: >60 mL/min (ref >60)
Glucose, Bld: 102 mg/dL — ABNORMAL HIGH (ref 70–99)
Potassium: 4 mmol/L (ref 3.5–5.1)
Sodium: 138 mmol/L (ref 135–145)

## 2022-05-27 LAB — GLUCOSE, CAPILLARY
Glucose-Capillary: 106 mg/dL — ABNORMAL HIGH (ref 70–99)
Glucose-Capillary: 129 mg/dL — ABNORMAL HIGH (ref 70–99)

## 2022-05-27 LAB — CBC
HCT: 35 % — ABNORMAL LOW (ref 36.0–46.0)
Hemoglobin: 10.9 g/dL — ABNORMAL LOW (ref 12.0–15.0)
MCH: 26.3 pg (ref 26.0–34.0)
MCHC: 31.1 g/dL (ref 30.0–36.0)
MCV: 84.3 fL (ref 80.0–100.0)
Platelets: 412 10*3/uL — ABNORMAL HIGH (ref 150–400)
RBC: 4.15 MIL/uL (ref 3.87–5.11)
RDW: 16.1 % — ABNORMAL HIGH (ref 11.5–15.5)
WBC: 8.4 10*3/uL (ref 4.0–10.5)
nRBC: 0 % (ref 0.0–0.2)

## 2022-05-27 MED ORDER — FUROSEMIDE 40 MG PO TABS: 40.0000 mg | ORAL_TABLET | Freq: Every day | ORAL | 0 refills | Status: DC

## 2022-05-27 NOTE — Progress Notes (Incomplete)
PROGRESS NOTE  Regina Perkins   EHU:[id number] DOB: [birth year]  DOA: 05/21/2022 Date of Service: 05/27/22 PCP: [facility] [place]  Brief Narrative / Hospital Course:  Regina Perkins is a 82 y.o. female with medical history significant of hypertension, diabetes mellitus, PVD, CKD-3A, tobacco abuse, who presents to ED via EMS from home 05/21/22 with shortness breath x1 day.  01/26: in ED, oxygen saturation 86% on room air, which improved to 94% on 2 L oxygen. Pt reports IV contrast allergy, (+)D-dimer, CXR neg, Tropes neg, plan VQ scan 01/27: VQ done, high probability PE (Large segmental perfusion defect of the left upper lobe without corresponding radiographic abnormality. Additional bilateral wedge-shaped subsegmental branch perfusion defects"). [place] neg DVT. Admitted to hospitalist service on IV heparin and supplemental O2.  01/28: Echo no RV strain, (+)moderate pericardial effusion, LVH. Pt noting dyspnea, will keep overnight on O2 and remain on heparin  01/29: Normal SpO2 but significant increased WOB and SOB with minimal exertion.  Kept on heparin.  CXR concerning for LLL opacity and noted potential for pulmonary hemorrhage. D/w patient and with vascular surgery, Dr. Lucky Cowboy, we will move forward with CTA chest, premedicating for contrast allergy.  Signed this out to [facility] night team 01/30: CT(+) likely lung cancer. D/w patient. Consults placed to medical oncology re: further workup/tx. Pulm consult for possible bronch.   Consultants:  Medical Oncology  Pulmonology for bronchoscopy   Procedures: none  ASSESSMENT & PLAN:   Principal Problem:   Abnormal CT chest likely malignancy likely pulmonary  Active Problems:   Essential hypertension   Chronic kidney disease, stage 3a (HCC)   Type II diabetes mellitus with renal manifestations ([place])   Hypokalemia   Hypomagnesemia   Tobacco abuse    Acute pulmonary embolism (HCC) - RULED OUT  Abnormal CT chest likely malignancy  likely pulmonary  Acute hypoxic respiratory failure d/t PE - improved, to RA. 2D echocardiogram --> no R heart strain Oncology consult - Dr Tasia Catchings CT abd/pelvis Pulmonary consult for bronch - Dr Lanney Gins Supplemental O2 as needed prn albuterol nebs and mucinex    Pericardial effusion Spoke w/ Dr Radford Pax, cardiology, can follow outpatient   Essential hypertension- well controlled Amlodipine, lisinopril   Chronic kidney disease, stage 3a ([place]): baseline Follow-up with BMP   Type II diabetes mellitus with renal manifestations ([place]): A1c 7.1 on admission SSI   Hypokalemia and hypomagnesemia on admission- resolved Repleted potassium and magnesium PRN  DVT prophylaxis: lovenox Pertinent IV fluids/nutrition: no continuous IV fluids Central lines / invasive devices: none  Code Status: FULL CODE  Current Admission Status: inpatient   TOC needs / Dispo plan: none at this time Barriers to discharge / significant pending items: dyspnea, pending oncology, pulmonology clearance    Subjective / Brief ROS:  Patient reports feeling well. She has no SOB at rest. Continues to get short of breath when she talks. She is taking the news of her new diagnosis very well. She is agreeable to bronchoscopy.   Family Communication: none at baseline  Objective Findings:  Vitals:   05/26/22 1637 05/26/22 1925 05/26/22 2333 05/27/22 0305  BP: (!) 135/90 137/73 135/73 128/68  Pulse: 94 (!) 103 90 90  Resp: 18 18 16 14   Temp: 97.6 F (36.4 C) 98.6 F (37 C) 97.8 F (36.6 C) 97.6 F (36.4 C)  TempSrc: Oral   Oral  SpO2: 96% 95% 94% 95%  Weight:      Height:  Intake/Output Summary (Last 24 hours) at 05/27/2022 0755 Last data filed at 05/27/2022 0306 Gross per 24 hour  Intake 220 ml  Output 1200 ml  Net -980 ml    Filed Weights   05/22/22 1028  Weight: 54.4 kg    Examination:  Physical Exam Constitutional:      General: She is not in acute distress. Cardiovascular:     Rate and  Rhythm: Normal rate and regular rhythm.  Pulmonary:     Effort: Pulmonary effort is normal. No tachypnea (becomes tachypneic on exertion).     Breath sounds: No decreased breath sounds.  Musculoskeletal:        General: Normal range of motion.     Right lower leg: No edema.     Left lower leg: No edema.  Skin:    General: Skin is warm and dry.  Neurological:     General: No focal deficit present.     Mental Status: She is alert and oriented to person, place, and time.  Psychiatric:        Mood and Affect: Mood normal.        Behavior: Behavior normal.   Scheduled Medications:  . amitriptyline  25 mg Oral q morning  . amLODipine  10 mg Oral Daily  . enoxaparin (LOVENOX) injection  40 mg Subcutaneous Q24H  . insulin aspart  0-5 Units Subcutaneous QHS  . insulin aspart  0-9 Units Subcutaneous TID WC  . lisinopril  40 mg Oral Daily  . nicotine  21 mg Transdermal Daily   Continuous Infusions: . ceFEPime (MAXIPIME) IV 2 g (05/26/22 2106)   PRN Medications:  acetaminophen, albuterol, dextromethorphan-guaiFENesin, hydrALAZINE, ondansetron (ZOFRAN) IV  Antimicrobials from admission:  Anti-infectives (From admission, onward)    Start     Dose/Rate Route Frequency Ordered Stop   05/25/22 0800  ceFEPIme (MAXIPIME) 2 g in sodium chloride 0.9 % 100 mL IVPB        2 g 200 mL/hr over 30 Minutes Intravenous Every 12 hours 05/25/22 0709        Data Reviewed:  I have personally reviewed the following.  CBC: Recent Labs  Lab 05/23/22 0405 05/24/22 0538 05/25/22 0247 05/26/22 0130 05/27/22 0543  WBC 9.3 9.7 8.7 15.1* 8.4  HGB 9.2* 10.6* 10.3* 9.9* 10.9*  HCT 29.5* 33.8* 32.6* 31.1* 35.0*  MCV 85.5 86.2 84.5 84.1 84.3  PLT 438* 471* 445* 405* 412*    Basic Metabolic Panel: Recent Labs  Lab 05/22/22 1037 05/22/22 1038 05/23/22 0405 05/24/22 0538 05/25/22 0247 05/26/22 0130 05/27/22 0543  NA  --   --  136 139 133* 136 138  K  --   --  3.6 4.1 4.4 3.6 4.0  CL  --   --   98 99 96* 100 101  CO2  --   --  29 30 30 29 31   GLUCOSE  --   --  102* 118* 185* 121* 102*  BUN  --   --  17 13 15 19 17   CREATININE  --   --  0.83 0.98 0.94 1.00 0.84  CALCIUM  --   --  8.7* 9.0 9.0 8.8* 8.9  MG  --  1.4* 1.6*  --   --   --   --   PHOS 3.0  --   --   --   --   --   --     Recent Labs  Lab 05/22/22 1038  INR 1.1    CBG: Recent  Labs  Lab 05/25/22 2128 05/26/22 0751 05/26/22 1153 05/26/22 1639 05/26/22 2037  GLUCAP 151* 112* 122* 109* 150*    Most Recent Urinalysis On File:     Component Value Date/Time   COLORURINE STRAW (A) 12/30/2015 1300   APPEARANCEUR CLEAR (A) 12/30/2015 1300   APPEARANCEUR Cloudy 01/16/2012 1937   LABSPEC 1.005 12/30/2015 1300   LABSPEC 1.028 01/16/2012 1937   PHURINE 7.0 12/30/2015 1300   GLUCOSEU >500 (A) 12/30/2015 1300   GLUCOSEU Negative 01/16/2012 1937   HGBUR 2+ (A) 12/30/2015 1300   BILIRUBINUR NEGATIVE 12/30/2015 1300   BILIRUBINUR Negative 01/16/2012 1937   KETONESUR 1+ (A) 12/30/2015 1300   PROTEINUR 30 (A) 12/30/2015 1300   NITRITE NEGATIVE 12/30/2015 1300   LEUKOCYTESUR NEGATIVE 12/30/2015 1300   LEUKOCYTESUR Negative 01/16/2012 1937   Sepsis Labs: @LABRCNTIP (procalcitonin:4,lacticidven:4) Microbiology: Recent Results (from the past 240 hour(s))  Resp Panel by RT-PCR (Flu A&B, Covid) Anterior Nasal Swab     Status: None   Collection Time: 05/21/22  8:52 PM   Specimen: Anterior Nasal Swab  Result Value Ref Range Status   SARS Coronavirus 2 by RT PCR NEGATIVE NEGATIVE Final    Comment: (NOTE) SARS-CoV-2 target nucleic acids are NOT DETECTED.  The SARS-CoV-2 RNA is generally detectable in upper respiratory specimens during the acute phase of infection. The lowest concentration of SARS-CoV-2 viral copies this assay can detect is 138 copies/mL. A negative result does not preclude SARS-Cov-2 infection and should not be used as the sole basis for treatment or other patient management decisions. A negative  result may occur with  improper specimen collection/handling, submission of specimen other than nasopharyngeal swab, presence of viral mutation(s) within the areas targeted by this assay, and inadequate number of viral copies(<138 copies/mL). A negative result must be combined with clinical observations, patient history, and epidemiological information. The expected result is Negative.  Fact Sheet for Patients:  EntrepreneurPulse.com.au  Fact Sheet for Healthcare Providers:  IncredibleEmployment.be  This test is no t yet approved or cleared by the [place] FDA and  has been authorized for detection and/or diagnosis of SARS-CoV-2 by FDA under an Emergency Use Authorization (EUA). This EUA will remain  in effect (meaning this test can be used) for the duration of the COVID-19 declaration under Section 564(b)(1) of the Act, 21 U.S.C.section 360bbb-3(b)(1), unless the authorization is terminated  or revoked sooner.       Influenza A by PCR NEGATIVE NEGATIVE Final   Influenza B by PCR NEGATIVE NEGATIVE Final    Comment: (NOTE) The Xpert Xpress SARS-CoV-2/FLU/RSV plus assay is intended as an aid in the diagnosis of influenza from Nasopharyngeal swab specimens and should not be used as a sole basis for treatment. Nasal washings and aspirates are unacceptable for Xpert Xpress SARS-CoV-2/FLU/RSV testing.  Fact Sheet for Patients: EntrepreneurPulse.com.au  Fact Sheet for Healthcare Providers: IncredibleEmployment.be  This test is not yet approved or cleared by the [place] FDA and has been authorized for detection and/or diagnosis of SARS-CoV-2 by FDA under an Emergency Use Authorization (EUA). This EUA will remain in effect (meaning this test can be used) for the duration of the COVID-19 declaration under Section 564(b)(1) of the Act, 21 U.S.C. section 360bbb-3(b)(1), unless the authorization is  terminated or revoked.  Performed at [facility], [address] [place], [place] [address]   MRSA Next Gen by PCR, Nasal     Status: None   Collection Time: 05/25/22  3:38 AM   Specimen: Nasal Mucosa; Nasal Swab  Result Value Ref Range Status   MRSA by PCR Next Gen NOT DETECTED NOT DETECTED Final    Comment: (NOTE) The GeneXpert MRSA Assay (FDA approved for NASAL specimens only), is one component of a comprehensive MRSA colonization surveillance program. It is not intended to diagnose MRSA infection nor to guide or monitor treatment for MRSA infections. Test performance is not FDA approved in patients less than 51 years old. Performed at [facility], [address], [place], [place] [address]     Radiology Studies last 3 days: CT ABDOMEN PELVIS WO CONTRAST  Result Date: 05/25/2022 CLINICAL DATA:  Unintentional weight loss. EXAM: CT ABDOMEN AND PELVIS WITHOUT CONTRAST TECHNIQUE: Multidetector CT imaging of the abdomen and pelvis was performed following the standard protocol without IV contrast. RADIATION DOSE REDUCTION: This exam was performed according to the departmental dose-optimization program which includes automated exposure control, adjustment of the mA and/or kV according to patient size and/or use of iterative reconstruction technique. COMPARISON:  Chest CTA 05/24/2022.  Abdominal CT 12/30/2015. FINDINGS: Lower chest: 6 mm right middle lobe pulmonary nodule on image 8/4 is unchanged from recent chest CT. Left pleural effusion and moderate pericardial effusion are also unchanged. Hepatobiliary: No suspicious liver lesions are demonstrated on noncontrast imaging. Well-circumscribed low-density lesions in the left hepatic lobe are stable and consistent with cysts. There is a small calcified gallstone. No evidence of gallbladder wall thickening or biliary dilatation. Pancreas: Unremarkable. No pancreatic ductal dilatation or surrounding inflammatory changes.  Spleen: Normal in size without focal abnormality. Adrenals/Urinary Tract: Both adrenal glands appear normal. Punctate nonobstructing calculus in the interpolar region of the left kidney. There is contrast material within the renal collecting systems bilaterally attributed to recent chest CTA. No hydronephrosis or perinephric soft tissue stranding. The bladder appears unremarkable for its degree of distention. Stomach/Bowel: Enteric contrast was administered and has passed into the colon. The stomach appears unremarkable for its degree of distension. No evidence of bowel wall thickening, distention or surrounding inflammatory change. Moderate stool throughout the colon. Vascular/Lymphatic: There are no enlarged abdominal or pelvic lymph nodes. Portions of the retroperitoneum are partly obscured by artifact from lumbar spinal hardware. There is aortic and branch vessel atherosclerosis without evidence of aneurysm. Reproductive: Hysterectomy.  No evidence of adnexal mass. Other: Small umbilical hernia containing fat and a small amount of fluid. No evidence of peritoneal nodularity. Musculoskeletal: No acute or significant osseous findings. Previous lower lumbar fusion with mild multilevel spondylosis. Mild degenerative changes of both hips. IMPRESSION: 1. No acute findings or explanation for the patient's symptoms. 2. No evidence of primary malignancy or metastatic disease within the abdomen or pelvis on noncontrast imaging. Please refer to recent chest CT demonstrating findings highly worrisome for thoracic malignancy. 3. Cholelithiasis without evidence of cholecystitis or biliary dilatation. 4. Nonobstructing left renal calculus. 5. Moderate stool throughout the colon suggesting constipation. 6.  Aortic Atherosclerosis (ICD10-I70.0). Electronically Signed   By: Richardean Sale M.D.   On: 05/25/2022 18:31   CT Angio Chest Pulmonary Embolism (PE) W or WO Contrast  Result Date: 05/24/2022 CLINICAL DATA:  Abnormal  x-ray.  Known PE on V/Q scan. EXAM: CT ANGIOGRAPHY CHEST WITH CONTRAST TECHNIQUE: Multidetector CT imaging of the chest was performed using the standard protocol during bolus administration of intravenous contrast. Multiplanar CT image reconstructions and MIPs were obtained to evaluate the vascular anatomy. RADIATION DOSE REDUCTION: This exam was performed according to the departmental dose-optimization program which includes automated exposure control, adjustment of the mA and/or kV according  to patient size and/or use of iterative reconstruction technique. CONTRAST:  92mL OMNIPAQUE IOHEXOL 350 MG/ML SOLN COMPARISON:  Chest x-ray 05/24/2022. Nuclear medicine perfusion lung scan 05/22/2022. FINDINGS: Cardiovascular: There is adequate opacification of the pulmonary arteries to the segmental level. There is no evidence for pulmonary embolism. The aorta is ectatic without focal dilatation. Heart is normal in size. There is a moderate-sized pericardial effusion. There are atherosclerotic calcifications of the aorta. Mediastinum/Nodes: Abnormal masslike soft tissue density in the prevascular space identified extending into the left upper lobe. This measures 4.9 x 3.5 x 4.4 cm. There is cutoff of bronchi in this region. There is an enlarged subcarinal lymph node measuring 13 mm short axis. There is an enlarged right hilar lymph node measuring 15 mm short axis. There is an enlarged left hilar lymph node measuring 12 mm short axis. The stomach is mildly distended with fluid. The visualized thyroid gland is within normal limits. Lungs/Pleura: There are additional bilateral pulmonary nodules measuring up to 11 mm (image 5/71 in the right lower lobe) there also some ill-defined patchy ground-glass and airspace opacities in the left upper lobe. There is a small left pleural effusion with compressive atelectasis of the left lower lobe. Secretions or other material noted within the left lower lobe bronchus image 5/60. Trachea and  airways are otherwise patent. Upper Abdomen: Gallstones are present. There are rounded hypodense areas within the liver measuring up to 19 mm favored as cysts. There are atherosclerotic calcifications of the aorta. There is a punctate nonobstructing left renal calculus. Musculoskeletal: No chest wall abnormality. No acute or significant osseous findings. Review of the MIP images confirms the above findings. IMPRESSION: 1. No evidence for pulmonary embolism. 2. Left upper lobe/prevascular mass worrisome for neoplasm. 3. Mediastinal and hilar lymphadenopathy. 4. Multiple pulmonary nodules measuring up to 11 mm worrisome for metastatic disease. 5. Small left pleural effusion. 6. Patchy ground-glass and airspace opacities in the left upper lobe worrisome for infection. 7. Moderate-sized pericardial effusion. 8. Cholelithiasis. 9. Nonobstructing left renal calculus. Aortic Atherosclerosis (ICD10-I70.0). Electronically Signed   By: Ronney Asters M.D.   On: 05/24/2022 19:58   DG Chest Port 1 View  Result Date: 05/24/2022 CLINICAL DATA:  Shortness of breath and abnormal lung sounds. High probability perfusion lung scan on 05/22/2022. EXAM: PORTABLE CHEST 1 VIEW COMPARISON:  05/21/2022 FINDINGS: New airspace opacity/consolidation in the left lower lobe. Mild central interstitial accentuation and airway thickening in the lungs. Mild enlargement of the cardiopericardial silhouette. IMPRESSION: 1. New airspace opacity/consolidation in the left lower lobe, suspicious for pneumonia although pulmonary hemorrhage can have. 2. Mild enlargement of the cardiopericardial silhouette. A similar appearance 3. Airway thickening and interstitial accentuation in the lungs favors bronchitis or reactive airways disease. Electronically Signed   By: Van Clines M.D.   On: 05/24/2022 12:40   ECHOCARDIOGRAM COMPLETE  Result Date: 05/23/2022    ECHOCARDIOGRAM REPORT   Patient Name:   Regina Perkins Date of Exam: 05/23/2022 Medical Rec  #:  [id number]      Height:       61.0 in Accession #:    [id number]     Weight:       120.0 lb Date of Birth:  11-Jul-1940      BSA:          1.520 m Patient Age:    13 years       BP:           148/77 mmHg Patient Gender: F  HR:           90 bpm. Exam Location:  [facility] Procedure: 2D Echo and Strain Analysis Indications:     Pulmonary Embolus I26.09  History:         Patient has no prior history of Echocardiogram examinations.  Sonographer:     Kathlen Brunswick RDCS Referring Phys:  [id number] Soledad Gerlach NIU Diagnosing Phys: Fransico Him MD  Sonographer Comments: Global longitudinal strain was attempted. IMPRESSIONS  1. Left ventricular ejection fraction, by estimation, is 60 to 65%. The left ventricle has normal function. The left ventricle has no regional wall motion abnormalities. There is severe concentric left ventricular hypertrophy. Left ventricular diastolic  parameters are consistent with Grade I diastolic dysfunction (impaired relaxation).  2. Right ventricular systolic function is normal. The right ventricular size is normal. There is normal pulmonary artery systolic pressure. The estimated right ventricular systolic pressure is 75.1 mmHg.  3. Moderate pericardial effusion. The pericardial effusion is circumferential, localized near the right atrium and anterior to the right ventricle. There is no evidence of cardiac tamponade.  4. The mitral valve is normal in structure. No evidence of mitral valve regurgitation. No evidence of mitral stenosis.  5. The aortic valve is normal in structure. Aortic valve regurgitation is mild. No aortic stenosis is present.  6. Aortic dilatation noted. There is mild dilatation of the aortic root, measuring 40 mm.  7. The inferior vena cava is normal in size with greater than 50% respiratory variability, suggesting right atrial pressure of 3 mmHg. FINDINGS  Left Ventricle: Left ventricular ejection fraction, by estimation, is 60 to 65%. The left ventricle has normal function.  The left ventricle has no regional wall motion abnormalities. Global longitudinal strain performed but not reported based on interpreter judgement due to suboptimal tracking. The left ventricular internal cavity size was normal in size. There is severe concentric left ventricular hypertrophy. Left ventricular diastolic parameters are consistent with Grade I diastolic dysfunction (impaired relaxation). Normal left ventricular filling pressure. Right Ventricle: The right ventricular size is normal. No increase in right ventricular wall thickness. Right ventricular systolic function is normal. There is normal pulmonary artery systolic pressure. The tricuspid regurgitant velocity is 2.60 m/s, and  with an assumed right atrial pressure of 3 mmHg, the estimated right ventricular systolic pressure is 02.5 mmHg. Left Atrium: Left atrial size was normal in size. Right Atrium: Right atrial size was normal in size. Pericardium: A moderately sized pericardial effusion is present. The pericardial effusion is circumferential, localized near the right atrium and anterior to the right ventricle. There is no evidence of cardiac tamponade. Mitral Valve: The mitral valve is normal in structure. No evidence of mitral valve regurgitation. No evidence of mitral valve stenosis. Tricuspid Valve: The tricuspid valve is normal in structure. Tricuspid valve regurgitation is trivial. No evidence of tricuspid stenosis. Aortic Valve: The aortic valve is normal in structure. Aortic valve regurgitation is mild. Aortic regurgitation PHT measures 508 msec. No aortic stenosis is present. Aortic valve peak gradient measures 5.7 mmHg. Pulmonic Valve: The pulmonic valve was normal in structure. Pulmonic valve regurgitation is trivial. No evidence of pulmonic stenosis. Aorta: Aortic dilatation noted. There is mild dilatation of the aortic root, measuring 40 mm. Venous: The inferior vena cava is normal in size with greater than 50% respiratory  variability, suggesting right atrial pressure of 3 mmHg. IAS/Shunts: No atrial level shunt detected by color flow Doppler.  LEFT VENTRICLE PLAX 2D LVIDd:         3.30  cm   Diastology LVIDs:         2.30 cm   LV e' medial:    8.16 cm/s LV PW:         1.70 cm   LV E/e' medial:  8.2 LV IVS:        1.70 cm   LV e' lateral:   9.14 cm/s LVOT diam:     1.80 cm   LV E/e' lateral: 7.3 LV SV:         50 LV SV Index:   33 LVOT Area:     2.54 cm  RIGHT VENTRICLE RV Basal diam:  2.20 cm RV S prime:     14.10 cm/s TAPSE (M-mode): 1.8 cm LEFT ATRIUM           Index        RIGHT ATRIUM          Index LA diam:      2.70 cm 1.78 cm/m   RA Area:     6.99 cm LA Vol (A2C): 14.1 ml 9.28 ml/m   RA Volume:   12.80 ml 8.42 ml/m LA Vol (A4C): 40.0 ml 26.31 ml/m  AORTIC VALVE                 PULMONIC VALVE AV Area (Vmax): 2.33 cm     PV Vmax:       1.09 m/s AV Vmax:        119.00 cm/s  PV Peak grad:  4.8 mmHg AV Peak Grad:   5.7 mmHg LVOT Vmax:      109.00 cm/s LVOT Vmean:     72.800 cm/s LVOT VTI:       0.196 m AI PHT:         508 msec  AORTA Ao Root diam: 4.00 cm Ao Asc diam:  3.40 cm MITRAL VALVE                TRICUSPID VALVE MV Area (PHT): 4.49 cm     TR Peak grad:   27.0 mmHg MV Decel Time: 169 msec     TR Vmax:        260.00 cm/s MV E velocity: 67.10 cm/s MV A velocity: 106.00 cm/s  SHUNTS MV E/A ratio:  0.63         Systemic VTI:  0.20 m                             Systemic Diam: 1.80 cm Fransico Him MD Electronically signed by Fransico Him MD Signature Date/Time: 05/23/2022/1:38:42 PM    Final       LOS: 5 days   Time spent: 50 mins    Richarda Osmond, DO [facility] 05/27/2022, 7:55 AM   Staff may message me via secure chat in [place]  but this may not receive an immediate response,  please page me for urgent matters!  If 7PM-7AM, please contact night coverage [email or web address]

## 2022-05-27 NOTE — Progress Notes (Signed)
Regina Perkins to be D/C'd Home per MD order.  Discussed prescriptions and follow up appointments with the patient. Prescriptions given to patient, medication list explained in detail. Pt verbalized understanding.  Allergies as of 05/27/2022       Reactions   Ivp Dye [iodinated Contrast Media]    Shellfish Allergy Other (See Comments)   Reaction: unknown         Medication List     STOP taking these medications    brimonidine 0.2 % ophthalmic solution Commonly known as: ALPHAGAN   cloNIDine 0.1 MG tablet Commonly known as: CATAPRES   gabapentin 300 MG capsule Commonly known as: NEURONTIN   latanoprost 0.005 % ophthalmic solution Commonly known as: XALATAN   levofloxacin 750 MG tablet Commonly known as: LEVAQUIN   lisinopril-hydrochlorothiazide 20-12.5 MG tablet Commonly known as: ZESTORETIC   loratadine 10 MG tablet Commonly known as: CLARITIN   metoprolol tartrate 25 MG tablet Commonly known as: LOPRESSOR       TAKE these medications    albuterol 108 (90 Base) MCG/ACT inhaler Commonly known as: VENTOLIN HFA Inhale 2 puffs into the lungs every 4 (four) hours as needed for wheezing or shortness of breath.   amLODipine 10 MG tablet Commonly known as: NORVASC Take 10 mg by mouth daily.   benzonatate 200 MG capsule Commonly known as: TESSALON Take 1 capsule (200 mg total) by mouth 3 (three) times daily as needed for cough. What changed:  when to take this reasons to take this   fluticasone 50 MCG/ACT nasal spray Commonly known as: FLONASE Place 1 spray into both nostrils daily.   furosemide 40 MG tablet Commonly known as: Lasix Take 1 tablet (40 mg total) by mouth daily.   lisinopril 40 MG tablet Commonly known as: ZESTRIL Take 40 mg by mouth daily.   metFORMIN 1000 MG tablet Commonly known as: GLUCOPHAGE Take 1,000 mg by mouth 2 (two) times daily with a meal.   naproxen 500 MG tablet Commonly known as: NAPROSYN Take 500 mg by mouth 2 (two)  times daily as needed for mild pain.        Vitals:   05/27/22 0824 05/27/22 1117  BP: (!) 147/78 128/65  Pulse: 96 90  Resp: 16 16  Temp: 98.2 F (36.8 C) 98.4 F (36.9 C)  SpO2: 96% 100%    Tele box removed and returned.Skin clean, dry and intact without evidence of skin break down, no evidence of skin tears noted. IV catheter discontinued intact. Site without signs and symptoms of complications. Dressing and pressure applied. Pt denies pain at this time. No complaints noted.  An After Visit Summary was printed and given to the patient. Patient escorted via [place], and D/C home via private auto.  Rolley Sims

## 2022-05-27 NOTE — Plan of Care (Signed)

## 2022-05-27 NOTE — Consult Note (Signed)
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  She denies sick contacts denies fevers or chills and did state that chest pain has resolved upon admission.  She was also seen prior to her admission in an urgent care setting and was prescribed albuterol inhaler which seemed to have helped her symptoms but not completely improved.  She denies any urinary or GI symptoms denies any injury or falls and denied any rectal bleeding.  On admission she had desaturation to mid 80s on room air which improved with 2 L supplemental oxygen.  When she first came in she did have an elevated D-dimer which prompted a CT PE study to be done her cardiac biomarkers were within reference range and renal function was also close to baseline.  She had a VQ scan performed which showed a large segmental perfusion defect in the left upper lobe.  CT PE showed abnormal masslike soft tissue density in the vascular space identified extending into the left upper lobe.  Measuring 4.9 x 4.4 cm.  There is a cut off of the bronchi in this region as well.  Noted subtle carinal lymphadenopathy as well as hilar and mediastinal lymphadenopathy in the lungs there were bilateral pulmonary nodules and ill-defined groundglass airspace densities in the upper lobe there is a small pleural effusion and compressive atelectasis.  There is also a moderate-sized pericardial effusion.  Pulmonary  consultation was placed for additional evaluation and management of these findings.   PAST MEDICAL HISTORY   Past Medical History:  Diagnosis Date   Chronic kidney disease, stage 3a ([place])    Diabetes mellitus without complication ([place])    HTN (hypertension)    Hypertension    Type II diabetes mellitus with renal manifestations ([place])      SURGICAL HISTORY   No past surgical history on file.   FAMILY HISTORY   Family History  Problem Relation Age of Onset   Asthma Mother    Heart disease Mother    Hypertension Mother    Diabetes Mother    Stroke Mother    Diabetes Sister    Diabetes Brother      SOCIAL HISTORY   Social History   Tobacco Use   Smoking status: Every Day    Packs/day: 0.50    Types: Cigarettes   Smokeless tobacco: Never  Substance Use Topics   Alcohol use: No   Drug use: No     MEDICATIONS    Home Medication:  Current Outpatient Rx   Order #: [id number] Class: Normal   Order #: [id number] Class: No Print    Current Medication:  Current Facility-Administered Medications:    acetaminophen (TYLENOL) tablet 650 mg, 650 mg, Oral, Q6H PRN, Ivor Costa, MD, 650 mg at 05/27/22 0900   albuterol (PROVENTIL) (2.5 MG/3ML) 0.083% nebulizer solution 3 mL, 3 mL, Inhalation, Q4H PRN, Blaine Hamper,  Soledad Gerlach, MD   amitriptyline (ELAVIL) tablet 25 mg, 25 mg, Oral, q morning, Delman Kitten, MD, 25 mg at 05/26/22 0814   amLODipine (NORVASC) tablet 10 mg, 10 mg, Oral, Daily, Ivor Costa, MD, 10 mg at 05/27/22 0855   ceFEPIme (MAXIPIME) 2 g in sodium chloride 0.9 % 100 mL IVPB, 2 g, Intravenous, Q12H, Dorothe Pea, RPH, Last Rate: 200 mL/hr at 05/27/22 0909, 2 g at 05/27/22 0909   dextromethorphan-guaiFENesin ([place] DM) 30-600 MG per 12 hr tablet 1 tablet, 1 tablet, Oral, BID PRN, Ivor Costa, MD   enoxaparin (LOVENOX) injection 40 mg, 40 mg, Subcutaneous, Q24H, Emeterio Reeve, DO, 40 mg at 05/26/22 2106   hydrALAZINE (APRESOLINE) injection 5 mg, 5 mg, Intravenous, Q2H  PRN, Ivor Costa, MD   insulin aspart (novoLOG) injection 0-5 Units, 0-5 Units, Subcutaneous, QHS, Ivor Costa, MD   insulin aspart (novoLOG) injection 0-9 Units, 0-9 Units, Subcutaneous, TID WC, Ivor Costa, MD, 2 Units at 05/25/22 1240   lisinopril (ZESTRIL) tablet 40 mg, 40 mg, Oral, Daily, Ivor Costa, MD, 40 mg at 05/27/22 1540   nicotine (NICODERM CQ - dosed in mg/24 hours) patch 21 mg, 21 mg, Transdermal, Daily, Ivor Costa, MD, 21 mg at 05/27/22 0859   ondansetron (ZOFRAN) injection 4 mg, 4 mg, Intravenous, Q8H PRN, Ivor Costa, MD    ALLERGIES   Ivp dye [iodinated contrast media] and Shellfish allergy     REVIEW OF SYSTEMS    Review of Systems:  Gen:  Denies  fever, sweats, chills weigh loss  HEENT: Denies blurred vision, double vision, ear pain, eye pain, hearing loss, nose bleeds, sore throat Cardiac:  No dizziness, chest pain or heaviness, chest tightness,edema Resp:   reports dyspnea chronically  Gi: Denies swallowing difficulty, stomach pain, nausea or vomiting, diarrhea, constipation, bowel incontinence Gu:  Denies bladder incontinence, burning urine Ext:   Denies Joint pain, stiffness or swelling Skin: Denies  skin rash, easy bruising or bleeding or hives Endoc:  Denies polyuria, polydipsia , polyphagia or weight change Psych:   Denies depression, insomnia or hallucinations   Other:  All other systems negative   VS: BP 128/65 (BP Location: Right Arm)   Pulse 90   Temp 98.4 F (36.9 C) (Oral)   Resp 16   Ht 5\' 1"  (1.549 m)   Wt 54.4 kg   SpO2 100%   BMI 22.67 kg/m      PHYSICAL EXAM    GENERAL:NAD, no fevers, chills, no weakness no fatigue HEAD: Normocephalic, atraumatic.  EYES: Pupils equal, round, reactive to light. Extraocular muscles intact. No scleral icterus.  MOUTH: Moist mucosal membrane. Dentition intact. No abscess noted.  EAR, NOSE, THROAT: Clear without exudates. No external lesions.  NECK: Supple. No thyromegaly. No nodules. No JVD.   PULMONARY: decreased breath sounds with mild rhonchi worse at bases bilaterally.  CARDIOVASCULAR: S1 and S2. Regular rate and rhythm. No murmurs, rubs, or gallops. No edema. Pedal pulses 2+ bilaterally.  GASTROINTESTINAL: Soft, nontender, nondistended. No masses. Positive bowel sounds. No hepatosplenomegaly.  MUSCULOSKELETAL: No swelling, clubbing, or edema. Range of motion full in all extremities.  NEUROLOGIC: Cranial nerves II through XII are intact. No gross focal neurological deficits. Sensation intact. Reflexes intact.  SKIN: No ulceration, lesions, rashes, or cyanosis. Skin warm and dry. Turgor intact.  PSYCHIATRIC: Mood, affect within normal limits. The patient is awake, alert and oriented x 3. Insight, judgment intact.       IMAGING     ASSESSMENT/PLAN   Masslike soft tissue  density of the left upper lobe with associated hilar and mediastinal lymphadenopathy   -Difficult to appreciate a lung mass and there is quite a bit of fluid off throughout the chest including the pericardial effusion and pleural effusions. -Transthoracic echo-05/23/2022-severe concentric LVH with diastolic heart failure-BNP within rest from [facility] on this admission.  Troponin also normal  -Currently patient is saturating 100% on room air with normal vital signs. -Net fluid balance is positive -Would recommend diuresis with Lasix 20 mg p.o. daily for the next 2 months -Outpatient PET scan to see if after diuresis there is any signs of mass -Possible bronchoscopic evaluation with endobronchial ultrasound assisted lymph node biopsies -Status post oncology evaluation with additional imaging that is reassuring -Patient does report unintentional weight loss over 20 pounds in the past month and has long standing smoking history which place patient at moderate pretest probability of lung CA. -Patient may be discharged home with outpatient pulmonology evaluation within 1 to 2 weeks to see  pulmonology.     Thank you for allowing me to participate in the care of this patient.   Patient/Family are satisfied with care plan and all questions have been answered.    Provider disclosure: Patient with at least one acute or chronic illness or injury that poses a threat to life or bodily function and is being managed actively during this encounter.  All of the below services have been performed independently by signing provider:  review of prior documentation from internal and or external health records.  Review of previous and current lab results.  Interview and comprehensive assessment during patient visit today. Review of current and previous chest radiographs/CT scans. Discussion of management and test interpretation with health care team and patient/family.   This document was prepared using Dragon voice recognition software and may include unintentional dictation errors.     Ottie Glazier, M.D.  Division of Pulmonary & Critical Care Medicine

## 2022-05-27 NOTE — Discharge Summary (Signed)
Physician Discharge Summary  Patient: Regina Perkins ZTI:[id number] DOB: 03/14/1941   Code Status: Full Code Admit date: 05/21/2022 Discharge date: 05/27/2022 Disposition: Home, PT and OT PCP: [facility] [place]  Recommendations for Outpatient Follow-up:  Follow up with PCP within 1-2 weeks Regarding general hospital follow up and preventative care Recommend  Follow up with pulmonology Regarding diagnosing, monitoring lung mass Follow up with cardiology Regarding pericardial effusion. Discharged on PO lasix and hemodynamically stable Follow up with oncology  Regarding lung mass  Discharge Diagnoses:  Principal Problem:   Acute pulmonary embolism ([place]) Active Problems:   Essential hypertension   Chronic kidney disease, stage 3a ([place])   Type II diabetes mellitus with renal manifestations (HCC)   Hypokalemia   Hypomagnesemia   Tobacco abuse   Lung mass   Pericardial effusion   Shortness of breath   Malignant neoplasm of lung [facility])  Brief Hospital Course Summary: Regina Perkins is a 82 y.o. female with medical history significant of hypertension, diabetes mellitus, PVD, CKD-3A, tobacco abuse, who presents to ED via EMS from home 05/21/22 with shortness breath x1 day.  01/26: in ED, oxygen saturation 86% on room air, which improved to 94% on 2 L oxygen. Pt reports IV contrast allergy, (+)D-dimer, CXR neg, Tropes neg, plan VQ scan 01/27: VQ done, high probability PE (Large segmental perfusion defect of the left upper lobe without corresponding radiographic abnormality. Additional bilateral wedge-shaped subsegmental branch perfusion defects"). [place] neg DVT. Admitted to hospitalist service on IV heparin and supplemental O2.  01/28: Echo no RV strain, (+)moderate pericardial effusion, LVH. Pt noting dyspnea, will keep overnight on O2 and remain on heparin  01/29: Normal SpO2 but significant increased WOB and SOB with minimal exertion.  Kept on heparin.  CXR concerning  for LLL opacity and noted potential for pulmonary hemorrhage. D/w patient and with vascular surgery, Dr. Lucky Cowboy. CTA chest ordered. 01/30: CT(+) likely lung cancer but negative for PE. D/w patient. Consults placed to medical oncology re: further workup/tx. Pulm consult for possible bronch. Heparin was discontinued. 2/1: pulmonology recommended against urgent bronchoscopy given pericardial effusion. Recommended PO lasix and follow up outpatient. Patient was stable ORA at time of discharge and instructed to follow up with pulmonology, cardiology, and oncology.   Discharge Condition: Good, improved Recommended discharge diet: Regular healthy diet  Consultations: Oncology Vascular surgery Pulmonology Cardiology   Procedures/Studies: Echo VQ scan, CTA  Allergies as of 05/27/2022       Reactions   Ivp Dye [iodinated Contrast Media]    Shellfish Allergy Other (See Comments)   Reaction: unknown         Medication List     STOP taking these medications    brimonidine 0.2 % ophthalmic solution Commonly known as: ALPHAGAN   cloNIDine 0.1 MG tablet Commonly known as: CATAPRES   gabapentin 300 MG capsule Commonly known as: NEURONTIN   latanoprost 0.005 % ophthalmic solution Commonly known as: XALATAN   levofloxacin 750 MG tablet Commonly known as: LEVAQUIN   lisinopril-hydrochlorothiazide 20-12.5 MG tablet Commonly known as: ZESTORETIC   loratadine 10 MG tablet Commonly known as: CLARITIN   metoprolol tartrate 25 MG tablet Commonly known as: LOPRESSOR       TAKE these medications    albuterol 108 (90 Base) MCG/ACT inhaler Commonly known as: VENTOLIN HFA Inhale 2 puffs into the lungs every 4 (four) hours as needed for wheezing or shortness of breath.   amLODipine 10 MG tablet Commonly known as: NORVASC Take  10 mg by mouth daily.   benzonatate 200 MG capsule Commonly known as: TESSALON Take 1 capsule (200 mg total) by mouth 3 (three) times daily as needed for  cough. What changed:  when to take this reasons to take this   fluticasone 50 MCG/ACT nasal spray Commonly known as: FLONASE Place 1 spray into both nostrils daily.   lisinopril 40 MG tablet Commonly known as: ZESTRIL Take 40 mg by mouth daily.   metFORMIN 1000 MG tablet Commonly known as: GLUCOPHAGE Take 1,000 mg by mouth 2 (two) times daily with a meal.   naproxen 500 MG tablet Commonly known as: NAPROSYN Take 500 mg by mouth 2 (two) times daily as needed for mild pain.       Subjective   Pt reports feeling well. She has no chest pain or shortness of breath at rest or when ambulating to use bathroom. She denies cough. Had some residual SOB when talking but is improving.   All questions and concerns were addressed at time of discharge.  Objective  Blood pressure 128/65, pulse 90, temperature 98.4 F (36.9 C), temperature source Oral, resp. rate 16, height 5\' 1"  (1.549 m), weight 54.4 kg, SpO2 100 %.   General: Pt is alert, awake, not in acute distress Cardiovascular: RRR, S1/S2 +, no rubs, no gallops Respiratory: CTA bilaterally, no wheezing, no rhonchi Abdominal: Soft, NT, ND, bowel sounds + Extremities: no edema, no cyanosis  The results of significant diagnostics from this hospitalization (including imaging, microbiology, ancillary and laboratory) are listed below for reference.   Imaging studies: CT ABDOMEN PELVIS WO CONTRAST  Result Date: 05/25/2022 CLINICAL DATA:  Unintentional weight loss. EXAM: CT ABDOMEN AND PELVIS WITHOUT CONTRAST TECHNIQUE: Multidetector CT imaging of the abdomen and pelvis was performed following the standard protocol without IV contrast. RADIATION DOSE REDUCTION: This exam was performed according to the departmental dose-optimization program which includes automated exposure control, adjustment of the mA and/or kV according to patient size and/or use of iterative reconstruction technique. COMPARISON:  Chest CTA 05/24/2022.  Abdominal CT  12/30/2015. FINDINGS: Lower chest: 6 mm right middle lobe pulmonary nodule on image 8/4 is unchanged from recent chest CT. Left pleural effusion and moderate pericardial effusion are also unchanged. Hepatobiliary: No suspicious liver lesions are demonstrated on noncontrast imaging. Well-circumscribed low-density lesions in the left hepatic lobe are stable and consistent with cysts. There is a small calcified gallstone. No evidence of gallbladder wall thickening or biliary dilatation. Pancreas: Unremarkable. No pancreatic ductal dilatation or surrounding inflammatory changes. Spleen: Normal in size without focal abnormality. Adrenals/Urinary Tract: Both adrenal glands appear normal. Punctate nonobstructing calculus in the interpolar region of the left kidney. There is contrast material within the renal collecting systems bilaterally attributed to recent chest CTA. No hydronephrosis or perinephric soft tissue stranding. The bladder appears unremarkable for its degree of distention. Stomach/Bowel: Enteric contrast was administered and has passed into the colon. The stomach appears unremarkable for its degree of distension. No evidence of bowel wall thickening, distention or surrounding inflammatory change. Moderate stool throughout the colon. Vascular/Lymphatic: There are no enlarged abdominal or pelvic lymph nodes. Portions of the retroperitoneum are partly obscured by artifact from lumbar spinal hardware. There is aortic and branch vessel atherosclerosis without evidence of aneurysm. Reproductive: Hysterectomy.  No evidence of adnexal mass. Other: Small umbilical hernia containing fat and a small amount of fluid. No evidence of peritoneal nodularity. Musculoskeletal: No acute or significant osseous findings. Previous lower lumbar fusion with mild multilevel spondylosis. Mild degenerative changes  of both hips. IMPRESSION: 1. No acute findings or explanation for the patient's symptoms. 2. No evidence of primary  malignancy or metastatic disease within the abdomen or pelvis on noncontrast imaging. Please refer to recent chest CT demonstrating findings highly worrisome for thoracic malignancy. 3. Cholelithiasis without evidence of cholecystitis or biliary dilatation. 4. Nonobstructing left renal calculus. 5. Moderate stool throughout the colon suggesting constipation. 6.  Aortic Atherosclerosis (ICD10-I70.0). Electronically Signed   By: Richardean Sale M.D.   On: 05/25/2022 18:31   CT Angio Chest Pulmonary Embolism (PE) W or WO Contrast  Result Date: 05/24/2022 CLINICAL DATA:  Abnormal x-ray.  Known PE on V/Q scan. EXAM: CT ANGIOGRAPHY CHEST WITH CONTRAST TECHNIQUE: Multidetector CT imaging of the chest was performed using the standard protocol during bolus administration of intravenous contrast. Multiplanar CT image reconstructions and MIPs were obtained to evaluate the vascular anatomy. RADIATION DOSE REDUCTION: This exam was performed according to the departmental dose-optimization program which includes automated exposure control, adjustment of the mA and/or kV according to patient size and/or use of iterative reconstruction technique. CONTRAST:  42mL OMNIPAQUE IOHEXOL 350 MG/ML SOLN COMPARISON:  Chest x-ray 05/24/2022. Nuclear medicine perfusion lung scan 05/22/2022. FINDINGS: Cardiovascular: There is adequate opacification of the pulmonary arteries to the segmental level. There is no evidence for pulmonary embolism. The aorta is ectatic without focal dilatation. Heart is normal in size. There is a moderate-sized pericardial effusion. There are atherosclerotic calcifications of the aorta. Mediastinum/Nodes: Abnormal masslike soft tissue density in the prevascular space identified extending into the left upper lobe. This measures 4.9 x 3.5 x 4.4 cm. There is cutoff of bronchi in this region. There is an enlarged subcarinal lymph node measuring 13 mm short axis. There is an enlarged right hilar lymph node measuring 15  mm short axis. There is an enlarged left hilar lymph node measuring 12 mm short axis. The stomach is mildly distended with fluid. The visualized thyroid gland is within normal limits. Lungs/Pleura: There are additional bilateral pulmonary nodules measuring up to 11 mm (image 5/71 in the right lower lobe) there also some ill-defined patchy ground-glass and airspace opacities in the left upper lobe. There is a small left pleural effusion with compressive atelectasis of the left lower lobe. Secretions or other material noted within the left lower lobe bronchus image 5/60. Trachea and airways are otherwise patent. Upper Abdomen: Gallstones are present. There are rounded hypodense areas within the liver measuring up to 19 mm favored as cysts. There are atherosclerotic calcifications of the aorta. There is a punctate nonobstructing left renal calculus. Musculoskeletal: No chest wall abnormality. No acute or significant osseous findings. Review of the MIP images confirms the above findings. IMPRESSION: 1. No evidence for pulmonary embolism. 2. Left upper lobe/prevascular mass worrisome for neoplasm. 3. Mediastinal and hilar lymphadenopathy. 4. Multiple pulmonary nodules measuring up to 11 mm worrisome for metastatic disease. 5. Small left pleural effusion. 6. Patchy ground-glass and airspace opacities in the left upper lobe worrisome for infection. 7. Moderate-sized pericardial effusion. 8. Cholelithiasis. 9. Nonobstructing left renal calculus. Aortic Atherosclerosis (ICD10-I70.0). Electronically Signed   By: Ronney Asters M.D.   On: 05/24/2022 19:58   DG Chest Port 1 View  Result Date: 05/24/2022 CLINICAL DATA:  Shortness of breath and abnormal lung sounds. High probability perfusion lung scan on 05/22/2022. EXAM: PORTABLE CHEST 1 VIEW COMPARISON:  05/21/2022 FINDINGS: New airspace opacity/consolidation in the left lower lobe. Mild central interstitial accentuation and airway thickening in the lungs. Mild enlargement  of the  cardiopericardial silhouette. IMPRESSION: 1. New airspace opacity/consolidation in the left lower lobe, suspicious for pneumonia although pulmonary hemorrhage can have. 2. Mild enlargement of the cardiopericardial silhouette. A similar appearance 3. Airway thickening and interstitial accentuation in the lungs favors bronchitis or reactive airways disease. Electronically Signed   By: Van Clines M.D.   On: 05/24/2022 12:40   ECHOCARDIOGRAM COMPLETE  Result Date: 05/23/2022    ECHOCARDIOGRAM REPORT   Patient Name:   TYLISHA DANIS Date of Exam: 05/23/2022 Medical Rec #:  [id number]      Height:       61.0 in Accession #:    [id number]     Weight:       120.0 lb Date of Birth:  [birth year]      BSA:          1.520 m Patient Age:    83 years       BP:           148/77 mmHg Patient Gender: F              HR:           90 bpm. Exam Location:  [facility] Procedure: 2D Echo and Strain Analysis Indications:     Pulmonary Embolus I26.09  History:         Patient has no prior history of Echocardiogram examinations.  Sonographer:     Kathlen Brunswick RDCS Referring Phys:  [id number] Soledad Gerlach NIU Diagnosing Phys: Fransico Him MD  Sonographer Comments: Global longitudinal strain was attempted. IMPRESSIONS  1. Left ventricular ejection fraction, by estimation, is 60 to 65%. The left ventricle has normal function. The left ventricle has no regional wall motion abnormalities. There is severe concentric left ventricular hypertrophy. Left ventricular diastolic  parameters are consistent with Grade I diastolic dysfunction (impaired relaxation).  2. Right ventricular systolic function is normal. The right ventricular size is normal. There is normal pulmonary artery systolic pressure. The estimated right ventricular systolic pressure is 27.0 mmHg.  3. Moderate pericardial effusion. The pericardial effusion is circumferential, localized near the right atrium and anterior to the right ventricle. There is no evidence of cardiac tamponade.   4. The mitral valve is normal in structure. No evidence of mitral valve regurgitation. No evidence of mitral stenosis.  5. The aortic valve is normal in structure. Aortic valve regurgitation is mild. No aortic stenosis is present.  6. Aortic dilatation noted. There is mild dilatation of the aortic root, measuring 40 mm.  7. The inferior vena cava is normal in size with greater than 50% respiratory variability, suggesting right atrial pressure of 3 mmHg. FINDINGS  Left Ventricle: Left ventricular ejection fraction, by estimation, is 60 to 65%. The left ventricle has normal function. The left ventricle has no regional wall motion abnormalities. Global longitudinal strain performed but not reported based on interpreter judgement due to suboptimal tracking. The left ventricular internal cavity size was normal in size. There is severe concentric left ventricular hypertrophy. Left ventricular diastolic parameters are consistent with Grade I diastolic dysfunction (impaired relaxation). Normal left ventricular filling pressure. Right Ventricle: The right ventricular size is normal. No increase in right ventricular wall thickness. Right ventricular systolic function is normal. There is normal pulmonary artery systolic pressure. The tricuspid regurgitant velocity is 2.60 m/s, and  with an assumed right atrial pressure of 3 mmHg, the estimated right ventricular systolic pressure is 35.0 mmHg. Left Atrium: Left atrial size was normal in size. Right Atrium: Right atrial size was normal  in size. Pericardium: A moderately sized pericardial effusion is present. The pericardial effusion is circumferential, localized near the right atrium and anterior to the right ventricle. There is no evidence of cardiac tamponade. Mitral Valve: The mitral valve is normal in structure. No evidence of mitral valve regurgitation. No evidence of mitral valve stenosis. Tricuspid Valve: The tricuspid valve is normal in structure. Tricuspid valve  regurgitation is trivial. No evidence of tricuspid stenosis. Aortic Valve: The aortic valve is normal in structure. Aortic valve regurgitation is mild. Aortic regurgitation PHT measures 508 msec. No aortic stenosis is present. Aortic valve peak gradient measures 5.7 mmHg. Pulmonic Valve: The pulmonic valve was normal in structure. Pulmonic valve regurgitation is trivial. No evidence of pulmonic stenosis. Aorta: Aortic dilatation noted. There is mild dilatation of the aortic root, measuring 40 mm. Venous: The inferior vena cava is normal in size with greater than 50% respiratory variability, suggesting right atrial pressure of 3 mmHg. IAS/Shunts: No atrial level shunt detected by color flow Doppler.  LEFT VENTRICLE PLAX 2D LVIDd:         3.30 cm   Diastology LVIDs:         2.30 cm   LV e' medial:    8.16 cm/s LV PW:         1.70 cm   LV E/e' medial:  8.2 LV IVS:        1.70 cm   LV e' lateral:   9.14 cm/s LVOT diam:     1.80 cm   LV E/e' lateral: 7.3 LV SV:         50 LV SV Index:   33 LVOT Area:     2.54 cm  RIGHT VENTRICLE RV Basal diam:  2.20 cm RV S prime:     14.10 cm/s TAPSE (M-mode): 1.8 cm LEFT ATRIUM           Index        RIGHT ATRIUM          Index LA diam:      2.70 cm 1.78 cm/m   RA Area:     6.99 cm LA Vol (A2C): 14.1 ml 9.28 ml/m   RA Volume:   12.80 ml 8.42 ml/m LA Vol (A4C): 40.0 ml 26.31 ml/m  AORTIC VALVE                 PULMONIC VALVE AV Area (Vmax): 2.33 cm     PV Vmax:       1.09 m/s AV Vmax:        119.00 cm/s  PV Peak grad:  4.8 mmHg AV Peak Grad:   5.7 mmHg LVOT Vmax:      109.00 cm/s LVOT Vmean:     72.800 cm/s LVOT VTI:       0.196 m AI PHT:         508 msec  AORTA Ao Root diam: 4.00 cm Ao Asc diam:  3.40 cm MITRAL VALVE                TRICUSPID VALVE MV Area (PHT): 4.49 cm     TR Peak grad:   27.0 mmHg MV Decel Time: 169 msec     TR Vmax:        260.00 cm/s MV E velocity: 67.10 cm/s MV A velocity: 106.00 cm/s  SHUNTS MV E/A ratio:  0.63         Systemic VTI:  0.20 m  Systemic Diam: 1.80 cm Fransico Him MD Electronically signed by Fransico Him MD Signature Date/Time: 05/23/2022/1:38:42 PM    Final    NM Pulmonary Perfusion  Result Date: 05/22/2022 CLINICAL DATA:  Pulmonary embolism (PE) suspected, high prob EXAM: NUCLEAR MEDICINE PERFUSION LUNG SCAN TECHNIQUE: Perfusion images were obtained in multiple projections after intravenous injection of radiopharmaceutical. Ventilation scans intentionally deferred if perfusion scan and chest x-ray adequate for interpretation during COVID 19 epidemic. RADIOPHARMACEUTICALS:  4.37 mCi Tc-43m MAA IV COMPARISON:  Chest x-ray 05/21/2022 FINDINGS: Large segmental perfusion defect of the left upper lobe without corresponding radiographic abnormality. Additional bilateral wedge-shaped subsegmental branch perfusion defects. IMPRESSION: High probability for pulmonary embolism. Electronically Signed   By: Davina Poke D.O.   On: 05/22/2022 10:01   US Venous Img Lower Bilateral  Result Date: 05/21/2022 CLINICAL DATA:  Shortness of breath EXAM: BILATERAL LOWER EXTREMITY VENOUS DOPPLER ULTRASOUND TECHNIQUE: Gray-scale sonography with graded compression, as well as color Doppler and duplex ultrasound were performed to evaluate the lower extremity deep venous systems from the level of the common femoral vein and including the common femoral, femoral, profunda femoral, popliteal and calf veins including the posterior tibial, peroneal and gastrocnemius veins when visible. The superficial great saphenous vein was also interrogated. Spectral Doppler was utilized to evaluate flow at rest and with distal augmentation maneuvers in the common femoral, femoral and popliteal veins. COMPARISON:  None Available. FINDINGS: RIGHT LOWER EXTREMITY Common Femoral Vein: No evidence of thrombus. Normal compressibility, respiratory phasicity and response to augmentation. Saphenofemoral Junction: No evidence of thrombus. Normal compressibility and flow  on color Doppler imaging. Profunda Femoral Vein: No evidence of thrombus. Normal compressibility and flow on color Doppler imaging. Femoral Vein: No evidence of thrombus. Normal compressibility, respiratory phasicity and response to augmentation. Popliteal Vein: No evidence of thrombus. Normal compressibility, respiratory phasicity and response to augmentation. Calf Veins: No evidence of thrombus. Normal compressibility and flow on color Doppler imaging. Superficial Great Saphenous Vein: No evidence of thrombus. Normal compressibility. Venous Reflux:  None. Other Findings:  None. LEFT LOWER EXTREMITY Common Femoral Vein: No evidence of thrombus. Normal compressibility, respiratory phasicity and response to augmentation. Saphenofemoral Junction: No evidence of thrombus. Normal compressibility and flow on color Doppler imaging. Profunda Femoral Vein: No evidence of thrombus. Normal compressibility and flow on color Doppler imaging. Femoral Vein: No evidence of thrombus. Normal compressibility, respiratory phasicity and response to augmentation. Popliteal Vein: No evidence of thrombus. Normal compressibility, respiratory phasicity and response to augmentation. Calf Veins: No evidence of thrombus. Normal compressibility and flow on color Doppler imaging. Superficial Great Saphenous Vein: No evidence of thrombus. Normal compressibility. Venous Reflux:  None. Other Findings:  None. IMPRESSION: No evidence of deep venous thrombosis in either lower extremity. Electronically Signed   By: Inez Catalina M.D.   On: 05/21/2022 22:37   DG Chest 2 View  Result Date: 05/21/2022 CLINICAL DATA:  sob EXAM: CHEST - 2 VIEW COMPARISON:  December 30, 2015, February 26, 2014 FINDINGS: Evaluation is limited by rotation. The cardiomediastinal silhouette is unchanged and enlarged in contour. Tortuous thoracic aorta. No pleural effusion. No pneumothorax. No acute pleuroparenchymal abnormality. Visualized abdomen is unremarkable. Multilevel  degenerative changes of the thoracic spine. IMPRESSION: No acute cardiopulmonary abnormality. Electronically Signed   By: Valentino Saxon M.D.   On: 05/21/2022 16:40    Labs: Basic Metabolic Panel: Recent Labs  Lab 05/22/22 1037 05/22/22 1038 05/23/22 0405 05/24/22 0538 05/25/22 0247 05/26/22 0130 05/27/22 0543  NA  --   --  136 139 133* 136 138  K  --   --  3.6 4.1 4.4 3.6 4.0  CL  --   --  98 99 96* 100 101  CO2  --   --  29 30 30 29 31   GLUCOSE  --   --  102* 118* 185* 121* 102*  BUN  --   --  17 13 15 19 17   CREATININE  --   --  0.83 0.98 0.94 1.00 0.84  CALCIUM  --   --  8.7* 9.0 9.0 8.8* 8.9  MG  --  1.4* 1.6*  --   --   --   --   PHOS 3.0  --   --   --   --   --   --    CBC: Recent Labs  Lab 05/23/22 0405 05/24/22 0538 05/25/22 0247 05/26/22 0130 05/27/22 0543  WBC 9.3 9.7 8.7 15.1* 8.4  HGB 9.2* 10.6* 10.3* 9.9* 10.9*  HCT 29.5* 33.8* 32.6* 31.1* 35.0*  MCV 85.5 86.2 84.5 84.1 84.3  PLT 438* 471* 445* 405* 412*   Microbiology: Results for orders placed or performed during the hospital encounter of 05/21/22  Resp Panel by RT-PCR (Flu A&B, Covid) Anterior Nasal Swab     Status: None   Collection Time: 05/21/22  8:52 PM   Specimen: Anterior Nasal Swab  Result Value Ref Range Status   SARS Coronavirus 2 by RT PCR NEGATIVE NEGATIVE Final    Comment: (NOTE) SARS-CoV-2 target nucleic acids are NOT DETECTED.  The SARS-CoV-2 RNA is generally detectable in upper respiratory specimens during the acute phase of infection. The lowest concentration of SARS-CoV-2 viral copies this assay can detect is 138 copies/mL. A negative result does not preclude SARS-Cov-2 infection and should not be used as the sole basis for treatment or other patient management decisions. A negative result may occur with  improper specimen collection/handling, submission of specimen other than nasopharyngeal swab, presence of viral mutation(s) within the areas targeted by this assay, and  inadequate number of viral copies(<138 copies/mL). A negative result must be combined with clinical observations, patient history, and epidemiological information. The expected result is Negative.  Fact Sheet for Patients:  EntrepreneurPulse.com.au  Fact Sheet for Healthcare Providers:  IncredibleEmployment.be  This test is no t yet approved or cleared by the [place] FDA and  has been authorized for detection and/or diagnosis of SARS-CoV-2 by FDA under an Emergency Use Authorization (EUA). This EUA will remain  in effect (meaning this test can be used) for the duration of the COVID-19 declaration under Section 564(b)(1) of the Act, 21 U.S.C.section 360bbb-3(b)(1), unless the authorization is terminated  or revoked sooner.       Influenza A by PCR NEGATIVE NEGATIVE Final   Influenza B by PCR NEGATIVE NEGATIVE Final    Comment: (NOTE) The Xpert Xpress SARS-CoV-2/FLU/RSV plus assay is intended as an aid in the diagnosis of influenza from Nasopharyngeal swab specimens and should not be used as a sole basis for treatment. Nasal washings and aspirates are unacceptable for Xpert Xpress SARS-CoV-2/FLU/RSV testing.  Fact Sheet for Patients: EntrepreneurPulse.com.au  Fact Sheet for Healthcare Providers: IncredibleEmployment.be  This test is not yet approved or cleared by the [place] FDA and has been authorized for detection and/or diagnosis of SARS-CoV-2 by FDA under an Emergency Use Authorization (EUA). This EUA will remain in effect (meaning this test can be used) for the duration of the COVID-19 declaration under Section 564(b)(1) of the Act, 21  U.S.C. section 360bbb-3(b)(1), unless the authorization is terminated or revoked.  Performed at [facility], [address] [place], [place] [address]   MRSA Next Gen by PCR, Nasal     Status: None   Collection Time: 05/25/22  3:38 AM    Specimen: Nasal Mucosa; Nasal Swab  Result Value Ref Range Status   MRSA by PCR Next Gen NOT DETECTED NOT DETECTED Final    Comment: (NOTE) The GeneXpert MRSA Assay (FDA approved for NASAL specimens only), is one component of a comprehensive MRSA colonization surveillance program. It is not intended to diagnose MRSA infection nor to guide or monitor treatment for MRSA infections. Test performance is not FDA approved in patients less than 100 years old. Performed at [facility], [address], [place], [place] [address]    Time coordinating discharge: Over 30 minutes  Richarda Osmond, MD  [facility] 05/27/2022, 1:24 PM

## 2022-05-27 NOTE — TOC Initial Note (Addendum)
Transition of Care [facility]) - Initial/Assessment Note    Patient Details  Name: Regina Perkins MRN: [id number] Date of Birth: November 01, 1940  [facility] CM/SW Contact:    Tiburcio Bash, LCSW Phone Number: 05/27/2022, 9:56 AM  Clinical Narrative:                  Update: patient also declined palliative services. Will discharge home.     CSW spoke with patient regarding initial recommendation of [facility], aware patient wishes to go home at dc. New lung cancer dx this admission.   Patient confirms she wants to go home at discharge, CSW inquired if patient agreeable to home health services, she asked if she had to as she really does not want. CSW informed patient she is able to make that decision depending on what she wants. Patient declines [facility] at this time.   CSW has reached out to MD to assess appropriateness for palliative to discuss, as patient may benefit from outpatient palliative services at dc.    Expected Discharge Plan: Home/Self Care Barriers to Discharge: Continued Medical Work up   Patient Goals and CMS Choice Patient states their goals for this hospitalization and ongoing recovery are:: to go home CMS Medicare.gov Compare Post Acute Care list provided to:: Patient Choice offered to / list presented to : Patient      Expected Discharge Plan and Services       Living arrangements for the past 2 months: Single Family Home                                      Prior Living Arrangements/Services Living arrangements for the past 2 months: Single Family Home                     Activities of Daily Living      Permission Sought/Granted                  Emotional Assessment              Admission diagnosis:  Shortness of breath [R06.02] Acute pulmonary embolism ([place]) [I26.99] Other acute pulmonary embolism, unspecified whether acute cor pulmonale present ([place]) [I26.99] Patient Active Problem List   Diagnosis Date Noted    Shortness of breath 05/26/2022   Malignant neoplasm of lung ([place]) 05/26/2022   Lung mass 05/25/2022   Pericardial effusion 05/25/2022   Acute pulmonary embolism ([place]) 05/22/2022   Chronic kidney disease, stage 3a ([place]) 05/22/2022   HTN (hypertension) 05/22/2022   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

## 2022-06-03 ENCOUNTER — Encounter: Payer: Self-pay | Admitting: *Deleted

## 2022-06-03 DIAGNOSIS — R918 Other nonspecific abnormal finding of lung field: Secondary | ICD-10-CM

## 2022-06-03 NOTE — Progress Notes (Signed)
Per Dr. Tasia Catchings, pt was seen in the hospital and will require further workup for abnormal CT scan findings. Pt will need a PET scan and biopsy. Pt has outpatient appt with Dr. Lanney Gins today to discuss biopsy. Will arrange for PET scan. Orders placed and pt will be called with appt once scheduled. Dr. Tasia Catchings would like to follow up with patient after biopsy 3-4 days. Nothing further needed at this time.

## 2022-06-07 ENCOUNTER — Other Ambulatory Visit: Payer: Self-pay | Admitting: Pulmonary Disease

## 2022-06-07 DIAGNOSIS — R0602 Shortness of breath: Secondary | ICD-10-CM

## 2022-06-07 DIAGNOSIS — R918 Other nonspecific abnormal finding of lung field: Secondary | ICD-10-CM

## 2022-06-11 ENCOUNTER — Encounter: Payer: Self-pay | Admitting: *Deleted

## 2022-06-11 NOTE — Progress Notes (Signed)
Spoke with patient regarding upcoming appt for PET scan on 2/20. Pt stated that she is aware of her appt at this time. Reviewed rationale for PET. All questions answered during call. Advised to keep appts as scheduled with pulmonary and Dr. Tasia Catchings will follow up with patient after biopsy if needed. Contact info given and instructed to call with any questions or needs. Pt verbalized understanding. Nothing further needed at this time.

## 2022-06-15 ENCOUNTER — Ambulatory Visit
Admission: RE | Admit: 2022-06-15 | Discharge: 2022-06-15 | Disposition: A | Discharge status: Home / Self Care | Payer: Medicare Other | Source: Ambulatory Visit | Attending: Oncology | Admitting: Oncology

## 2022-06-15 DIAGNOSIS — R918 Other nonspecific abnormal finding of lung field: Secondary | ICD-10-CM | POA: Diagnosis not present

## 2022-06-15 DIAGNOSIS — I3139 Other pericardial effusion (noninflammatory): Secondary | ICD-10-CM | POA: Insufficient documentation

## 2022-06-15 DIAGNOSIS — R599 Enlarged lymph nodes, unspecified: Secondary | ICD-10-CM | POA: Diagnosis not present

## 2022-06-15 DIAGNOSIS — J90 Pleural effusion, not elsewhere classified: Secondary | ICD-10-CM | POA: Diagnosis not present

## 2022-06-15 DIAGNOSIS — N200 Calculus of kidney: Secondary | ICD-10-CM | POA: Insufficient documentation

## 2022-06-15 DIAGNOSIS — R222 Localized swelling, mass and lump, trunk: Secondary | ICD-10-CM | POA: Insufficient documentation

## 2022-06-15 DIAGNOSIS — I2510 Atherosclerotic heart disease of native coronary artery without angina pectoris: Secondary | ICD-10-CM | POA: Diagnosis not present

## 2022-06-15 DIAGNOSIS — I700 Atherosclerosis of aorta: Secondary | ICD-10-CM | POA: Diagnosis not present

## 2022-06-15 LAB — GLUCOSE, CAPILLARY: Glucose-Capillary: 116 mg/dL — ABNORMAL HIGH (ref 70–99)

## 2022-06-15 MED ORDER — FLUDEOXYGLUCOSE F - 18 (FDG) INJECTION
6.1600 | Freq: Once | INTRAVENOUS | Status: AC | PRN
  Administered 2022-06-15: 6.16 via INTRAVENOUS

## 2022-06-17 ENCOUNTER — Encounter: Payer: Self-pay | Admitting: *Deleted

## 2022-06-17 ENCOUNTER — Ambulatory Visit
Admission: RE | Admit: 2022-06-17 | Discharge: 2022-06-17 | Disposition: A | Discharge status: Home / Self Care | Payer: Medicare Other | Source: Ambulatory Visit | Attending: Pulmonary Disease | Admitting: Pulmonary Disease

## 2022-06-17 DIAGNOSIS — R918 Other nonspecific abnormal finding of lung field: Secondary | ICD-10-CM

## 2022-06-17 DIAGNOSIS — R0602 Shortness of breath: Secondary | ICD-10-CM | POA: Diagnosis present

## 2022-06-17 DIAGNOSIS — R590 Localized enlarged lymph nodes: Secondary | ICD-10-CM

## 2022-06-17 NOTE — Progress Notes (Signed)
Per Dr. Tasia Catchings, pt needs to be scheduled for STAT biopsy of the right supraclavicular lymph node. Per Dr. Denna Haggard, okay to proceed with scheduling at this time. Orders placed. Checklist faxed. Pt will be called with appt once scheduled.

## 2022-06-21 ENCOUNTER — Ambulatory Visit
Admission: RE | Admit: 2022-06-21 | Discharge: 2022-06-21 | Disposition: A | Discharge status: Home / Self Care | Payer: Medicare Other | Source: Ambulatory Visit | Attending: Oncology | Admitting: Oncology

## 2022-06-21 DIAGNOSIS — C770 Secondary and unspecified malignant neoplasm of lymph nodes of head, face and neck: Secondary | ICD-10-CM | POA: Diagnosis not present

## 2022-06-21 DIAGNOSIS — R918 Other nonspecific abnormal finding of lung field: Secondary | ICD-10-CM | POA: Diagnosis present

## 2022-06-21 DIAGNOSIS — R590 Localized enlarged lymph nodes: Secondary | ICD-10-CM

## 2022-06-21 MED ORDER — LIDOCAINE HCL (PF) 1 % IJ SOLN
6.0000 mL | Freq: Once | INTRAMUSCULAR | Status: AC
  Administered 2022-06-21: 6 mL via INTRADERMAL

## 2022-06-21 NOTE — Discharge Instructions (Signed)
Discharge instructions reviewed with patient.

## 2022-06-24 LAB — SURGICAL PATHOLOGY

## 2022-06-26 ENCOUNTER — Encounter: Payer: Self-pay | Admitting: Urgent Care

## 2022-06-28 ENCOUNTER — Telehealth: Payer: Self-pay

## 2022-06-28 ENCOUNTER — Inpatient Hospital Stay: Payer: Medicare Other | Attending: Oncology | Admitting: Oncology

## 2022-06-28 ENCOUNTER — Encounter: Payer: Self-pay | Admitting: Oncology

## 2022-06-28 ENCOUNTER — Encounter
Admission: RE | Admit: 2022-06-28 | Discharge: 2022-06-28 | Disposition: A | Discharge status: Home / Self Care | Payer: Medicare Other | Source: Ambulatory Visit | Attending: Pulmonary Disease | Admitting: Pulmonary Disease

## 2022-06-28 ENCOUNTER — Encounter: Payer: Self-pay | Admitting: *Deleted

## 2022-06-28 VITALS — BP 113/57 | HR 88 | Temp 96.9°F | Resp 18 | Wt 118.8 lb

## 2022-06-28 DIAGNOSIS — F17210 Nicotine dependence, cigarettes, uncomplicated: Secondary | ICD-10-CM | POA: Diagnosis not present

## 2022-06-28 DIAGNOSIS — Z7984 Long term (current) use of oral hypoglycemic drugs: Secondary | ICD-10-CM | POA: Diagnosis not present

## 2022-06-28 DIAGNOSIS — C3482 Malignant neoplasm of overlapping sites of left bronchus and lung: Secondary | ICD-10-CM

## 2022-06-28 DIAGNOSIS — N1831 Chronic kidney disease, stage 3a: Secondary | ICD-10-CM | POA: Diagnosis not present

## 2022-06-28 DIAGNOSIS — C3412 Malignant neoplasm of upper lobe, left bronchus or lung: Secondary | ICD-10-CM | POA: Diagnosis present

## 2022-06-28 DIAGNOSIS — Z7189 Other specified counseling: Secondary | ICD-10-CM | POA: Insufficient documentation

## 2022-06-28 DIAGNOSIS — I1310 Hypertensive heart and chronic kidney disease without heart failure, with stage 1 through stage 4 chronic kidney disease, or unspecified chronic kidney disease: Secondary | ICD-10-CM | POA: Diagnosis not present

## 2022-06-28 DIAGNOSIS — Z7901 Long term (current) use of anticoagulants: Secondary | ICD-10-CM | POA: Insufficient documentation

## 2022-06-28 DIAGNOSIS — E46 Unspecified protein-calorie malnutrition: Secondary | ICD-10-CM | POA: Diagnosis not present

## 2022-06-28 DIAGNOSIS — C787 Secondary malignant neoplasm of liver and intrahepatic bile duct: Secondary | ICD-10-CM | POA: Diagnosis present

## 2022-06-28 DIAGNOSIS — E43 Unspecified severe protein-calorie malnutrition: Secondary | ICD-10-CM | POA: Diagnosis not present

## 2022-06-28 DIAGNOSIS — D649 Anemia, unspecified: Secondary | ICD-10-CM | POA: Insufficient documentation

## 2022-06-28 DIAGNOSIS — Z79899 Other long term (current) drug therapy: Secondary | ICD-10-CM | POA: Insufficient documentation

## 2022-06-28 DIAGNOSIS — C3490 Malignant neoplasm of unspecified part of unspecified bronchus or lung: Secondary | ICD-10-CM

## 2022-06-28 DIAGNOSIS — Z86711 Personal history of pulmonary embolism: Secondary | ICD-10-CM | POA: Diagnosis not present

## 2022-06-28 DIAGNOSIS — Z5112 Encounter for antineoplastic immunotherapy: Secondary | ICD-10-CM | POA: Diagnosis present

## 2022-06-28 DIAGNOSIS — E1122 Type 2 diabetes mellitus with diabetic chronic kidney disease: Secondary | ICD-10-CM | POA: Diagnosis not present

## 2022-06-28 DIAGNOSIS — R918 Other nonspecific abnormal finding of lung field: Secondary | ICD-10-CM | POA: Diagnosis not present

## 2022-06-28 HISTORY — DX: Malignant neoplasm of unspecified part of unspecified bronchus or lung: C34.90

## 2022-06-28 NOTE — Progress Notes (Addendum)
Hematology/Oncology Consult Note [phone] [phone]     REFERRING PROVIDER: [facility] [place]:  Lung cancer  ASSESSMENT & PLAN:   Malignant neoplasm of lung ([place]) Biopsy diagnosis were reviewed and discussed with patient.  Non-small cell lung cancer, favor squamous cell carcinoma, stage IV Recommend MRI brain with and without contrast Refer to radiation oncology for palliative  radiation-prevascular bulky disease Recommend systematic chemotherapy + immunotherapy. Patient has PS of 1-2, elderly, not a great candidate for aggressive chemotherapy.  I recommend immunotherapy +/- dose reduced single chemotherapy ie carboplatin Check NGS  Refer to palliative care   Malnutrition ([place]) Refer to nutritionist  Goals of care, counseling/discussion Discussed with patient and granddaughter  Orders Placed This Encounter  Procedures   MR Brain W Wo Contrast    Standing Status:   Future    Standing Expiration Date:   06/28/2023    Order Specific Question:   If indicated for the ordered procedure, I authorize the administration of contrast media per Radiology protocol    Answer:   Yes    Order Specific Question:   What is the patient's sedation requirement?    Answer:   No Sedation    Order Specific Question:   Does the patient have a pacemaker or implanted devices?    Answer:   No    Order Specific Question:   Use SRS Protocol?    Answer:   Yes    Order Specific Question:   Preferred imaging location?    Answer:   [facility] (table limit - 550lbs)   Ambulatory Referral to Palliative Care    Referral Priority:   Routine    Referral Type:   Consultation    Number of Visits Requested:   1   Ambulatory Referral to [facility]    Referral Priority:   Routine    Referral Type:   Consultation    Referral Reason:   Specialty Services Required    Number of Visits Requested:   1   Ambulatory referral to  Radiation Oncology    Referral Priority:   Routine    Referral Type:   Consultation    Referral Reason:   Specialty Services Required    Requested Specialty:   Radiation Oncology    Number of Visits Requested:   1   Ambulatory referral to Social Work    Referral Priority:   Routine    Referral Type:   Consultation    Referral Reason:   Specialty Services Required    Number of Visits Requested:   1    All questions were answered. The patient knows to call the clinic with any problems, questions or concerns.  Earlie Server, MD, PhD [facility] Hematology Oncology 06/28/2022       HISTORY OF PRESENTING ILLNESS:  Regina Perkins 82 y.o. female presents to establish care for lung cancer  I have reviewed her chart and materials related to her cancer extensively and collaborated history with the patient. Summary of oncologic history is as follows: Oncology History  Malignant neoplasm of lung ([place])  05/24/2022 Imaging   CT chest angiogram showed 1. No evidence for pulmonary embolism. 2. Left upper lobe/prevascular mass worrisome for neoplasm. 3. Mediastinal and hilar lymphadenopathy. 4. Multiple pulmonary nodules measuring up to 11 mm worrisome for metastatic disease. 5. Small left pleural effusion. 6. Patchy ground-glass and airspace opacities in the left upper lobe worrisome for infection. 7. Moderate-sized pericardial effusion.  8. Cholelithiasis. 9. Nonobstructing left renal calculus   05/25/2022 Imaging   CT abdomen pelvis wo contrast 1. No acute findings or explanation for the patient's symptoms. 2. No evidence of primary malignancy or metastatic disease within the abdomen or pelvis on noncontrast imaging.  3. Cholelithiasis without evidence of cholecystitis or biliary dilatation. 4. Nonobstructing left renal calculus. 5. Moderate stool throughout the colon suggesting constipation. 6.  Aortic Atherosclerosis    05/26/2022 Initial Diagnosis   Malignant neoplasm of lung    -05/22/2022 in the emergency room, she was found to have hypoxia with oxygen levels of 86% on room air, improved to 94 with 2 L of oxygen.  D-dimer was elevated at 1.29, troponin negative.  Lower extremity Doppler was negative for DVT.  VQ scan high probability PE (Large segmental perfusion defect of the left upper lobe without corresponding radiographic abnormality. Additional bilateral wedge-shaped subsegmental branch perfusion defects .  Patient was admitted and started on heparin Echocardiogram showed no RV strain.  Moderate pericardial effusion. Over her hospitalization, she continues to have shortness of breath with minimal exertion. 05/24/2022, CT chest angiogram showed left lung mass with bilateral lung nodules.   06/16/2022 Imaging   PET scan  1. 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 No evidence of metastatic disease in the abdomen or pelvis. 2. Moderate pericardial effusion. 3. Tiny left pleural effusion. 4. Left renal stone. 5. Aortic atherosclerosis (ICD10-I70.0). Coronary artery calcification. 6. Enlarged pulmonic trunk, indicative of pulmonary arterial hypertension    06/21/2022 Procedure   US guided liver right supraclvicular node biopsy showed Metastatic poorly differentiated carcinoma, favor squamous cell carcinoma, probably pulmonary origin.    06/28/2022 Cancer Staging   Staging form: Lung, AJCC 8th Edition - Clinical: Stage IVA (cT4, cN3, cM1a) - Signed by Earlie Server, MD on 06/28/2022    Today patient is accompanied by her granddaughter.  She continues to have SOB with exertion. Livers home with another granddaughter.  Appetite is not good.  + weight loss  MEDICAL HISTORY:  Past Medical History:   Diagnosis Date   Chronic kidney disease, stage 3a ([place])    Diabetes mellitus without complication ([place])    HTN (hypertension)    Hypertension    Type II diabetes mellitus with renal manifestations ([place])     SURGICAL HISTORY: History reviewed. No pertinent surgical history.  SOCIAL HISTORY: Social History   Socioeconomic History   Marital status: Widowed    Spouse name: Not on file   Number of children: Not on file   Years of education: Not on file   Highest education level: Not on file  Occupational History   Not on file  Tobacco Use   Smoking status: Every Day    Packs/day: 0.50    Types: Cigarettes   Smokeless tobacco: Never  Substance and Sexual Activity   Alcohol use: No   Drug use: No   Sexual activity: Not on file  Other Topics Concern   Not on file  Social History Narrative   Not on file   Social Determinants of Health   Financial Resource Strain: Low Risk  (06/28/2022)   Overall Financial Resource Strain (CARDIA)    Difficulty of Paying Living Expenses: Not very hard  Food Insecurity: Food Insecurity Present (06/28/2022)   Hunger Vital Sign  Worried About Charity fundraiser in the Last Year: Often true    [place] in the Last Year: Often true  Transportation Needs: No Transportation Needs (06/28/2022)   PRAPARE - Hydrologist (Medical): No    Lack of Transportation (Non-Medical): No  Physical Activity: Not on file  Stress: No Stress Concern Present (06/28/2022)   [place]    Feeling of Stress : Not at all  Social Connections: Not on file  Intimate Partner Violence: Not At Risk (06/28/2022)   Humiliation, Afraid, Rape, and Kick questionnaire    Fear of Current or Ex-Partner: No    Emotionally Abused: No    Physically Abused: No    Sexually Abused: No    FAMILY HISTORY: Family History  Problem Relation Age of Onset   Asthma Mother    Heart disease  Mother    Hypertension Mother    Diabetes Mother    Stroke Mother    Diabetes Sister    Diabetes Brother     ALLERGIES:  is allergic to ivp dye [iodinated contrast media] and shellfish allergy.  MEDICATIONS:  Current Outpatient Medications  Medication Sig Dispense Refill   albuterol (VENTOLIN HFA) 108 (90 Base) MCG/ACT inhaler Inhale 2 puffs into the lungs every 4 (four) hours as needed for wheezing or shortness of breath.     amLODipine (NORVASC) 10 MG tablet Take 10 mg by mouth daily.     BREZTRI AEROSPHERE 160-9-4.8 MCG/ACT AERO Inhale into the lungs.     fluticasone (FLONASE) 50 MCG/ACT nasal spray Place 1 spray into both nostrils daily.     Fluticasone-Umeclidin-Vilant (TRELEGY ELLIPTA) 100-62.5-25 MCG/ACT AEPB Inhale into the lungs.     furosemide (LASIX) 40 MG tablet Take 1 tablet (40 mg total) by mouth daily. 60 tablet 0   lisinopril (ZESTRIL) 40 MG tablet Take 40 mg by mouth daily.     metFORMIN (GLUCOPHAGE) 1000 MG tablet Take 1,000 mg by mouth 2 (two) times daily with a meal.     naproxen (NAPROSYN) 500 MG tablet Take 500 mg by mouth 2 (two) times daily as needed for mild pain.     predniSONE (DELTASONE) 20 MG tablet Take by mouth.     benzonatate (TESSALON) 200 MG capsule Take 1 capsule (200 mg total) by mouth 3 (three) times daily as needed for cough. (Patient not taking: Reported on 06/28/2022) 20 capsule    No current facility-administered medications for this visit.    Review of Systems  Constitutional:  Negative for appetite change, chills, fatigue and fever.  HENT:   Negative for hearing loss and voice change.   Eyes:  Negative for eye problems.  Respiratory:  Positive for shortness of breath. Negative for chest tightness and cough.   Cardiovascular:  Negative for chest pain.  Gastrointestinal:  Negative for abdominal distention, abdominal pain and blood in stool.  Endocrine: Negative for hot flashes.  Genitourinary:  Negative for difficulty urinating and  frequency.   Musculoskeletal:  Negative for arthralgias.  Skin:  Negative for itching and rash.  Neurological:  Negative for extremity weakness.  Hematological:  Negative for adenopathy.  Psychiatric/Behavioral:  Negative for confusion.      PHYSICAL EXAMINATION: ECOG PERFORMANCE STATUS: 2 - Symptomatic, <50% confined to bed  Vitals:   06/28/22 1117  BP: (!) 113/57  Pulse: 88  Resp: 18  Temp: (!) 96.9 F (36.1 C)  SpO2: 100%   Filed  Weights   06/28/22 1117  Weight: 118 lb 12.8 oz (53.9 kg)    Physical Exam Constitutional:      General: She is not in acute distress.    Appearance: She is not diaphoretic.  HENT:     Head: Normocephalic and atraumatic.     Nose: Nose normal.     Mouth/Throat:     Pharynx: No oropharyngeal exudate.  Eyes:     General: No scleral icterus.    Pupils: Pupils are equal, round, and reactive to light.  Cardiovascular:     Rate and Rhythm: Normal rate and regular rhythm.     Heart sounds: No murmur heard. Pulmonary:     Effort: Pulmonary effort is normal. No respiratory distress.     Breath sounds: No wheezing.     Comments: Decreased breath sounds bilaterally Abdominal:     General: There is no distension.     Palpations: Abdomen is soft.     Tenderness: There is no abdominal tenderness.  Musculoskeletal:        General: Normal range of motion.     Cervical back: Normal range of motion and neck supple.  Skin:    General: Skin is warm and dry.     Findings: No erythema.  Neurological:     Mental Status: She is alert and oriented to person, place, and time.     Cranial Nerves: No cranial nerve deficit.     Motor: No abnormal muscle tone.     Coordination: Coordination normal.  Psychiatric:        Mood and Affect: Mood and affect normal.      LABORATORY DATA:  I have reviewed the data as listed    Latest Ref Rng & Units 05/27/2022    5:43 AM 05/26/2022    1:30 AM 05/25/2022    2:47 AM  CBC  WBC 4.0 - 10.5 K/uL 8.4  15.1  8.7    Hemoglobin 12.0 - 15.0 g/dL 10.9  9.9  10.3   Hematocrit 36.0 - 46.0 % 35.0  31.1  32.6   Platelets 150 - 400 K/uL 412  405  445       Latest Ref Rng & Units 05/27/2022    5:43 AM 05/26/2022    1:30 AM 05/25/2022    2:47 AM  CMP  Glucose 70 - 99 mg/dL 102  121  185   BUN 8 - 23 mg/dL '17  19  15   '$ Creatinine 0.44 - 1.00 mg/dL 0.84  1.00  0.94   Sodium 135 - 145 mmol/L 138  136  133   Potassium 3.5 - 5.1 mmol/L 4.0  3.6  4.4   Chloride 98 - 111 mmol/L 101  100  96   CO2 22 - 32 mmol/L '31  29  30   '$ Calcium 8.9 - 10.3 mg/dL 8.9  8.8  9.0      RADIOGRAPHIC STUDIES: I have personally reviewed the radiological images as listed and agreed with the findings in the report. [place] CORE BIOPSY (LYMPH NODES)  Result Date: 06/21/2022 INDICATION: right supraclavicular lymphadenopathy EXAM: Ultrasound-guided core needle biopsy of right supraclavicular lymph node MEDICATIONS: None. ANESTHESIA/SEDATION: Local analgesia COMPLICATIONS: None immediate. PROCEDURE: Informed written consent was obtained from the patient after a thorough discussion of the procedural risks, benefits and alternatives. All questions were addressed. Maximal Sterile Barrier Technique was utilized including caps, mask, sterile gowns, sterile gloves, sterile drape, hand hygiene and skin antiseptic. A timeout was performed prior to the  initiation of the procedure. The patient was placed supine on the exam table. Ultrasound of the right supraclavicular soft tissues demonstrated abnormal an enlarged supraclavicular lymph node. Skin entry site was marked, and the overlying skin was prepped draped in the standard sterile fashion. Local analgesia was obtained with 1% lidocaine. Using ultrasound guidance, core needle biopsy was performed of the right supraclavicular lymph node using an 18 gauge core biopsy device x6 total passes. Specimens were submitted in saline to pathology for further handling. Limited postprocedure imaging demonstrated no  hematoma. A clean dressing was placed after manual hemostasis. The patient tolerated the procedure well without immediate complication. IMPRESSION: Successful ultrasound-guided core needle biopsy of abnormal right supraclavicular lymph node. Electronically Signed   By: Albin Felling M.D.   On: 06/21/2022 16:16   CT CHEST WO CONTRAST  Result Date: 06/19/2022 CLINICAL DATA:  Lung mass * Tracking Code: BO * EXAM: CT CHEST WITHOUT CONTRAST TECHNIQUE: Multidetector CT imaging of the chest was performed following the standard protocol without IV contrast. RADIATION DOSE REDUCTION: This exam was performed according to the departmental dose-optimization program which includes automated exposure control, adjustment of the mA and/or kV according to patient size and/or use of iterative reconstruction technique. COMPARISON:  PET-CT, 06/15/2022 FINDINGS: Cardiovascular: Aortic atherosclerosis. Cardiomegaly. Left and right coronary artery calcifications. Unchanged moderate pericardial effusion. Mediastinum/Nodes: Prevascular and AP window soft tissue mass, confluent with left upper lobe mass and measuring in total 5.6 x 3.9 cm (series 2, image 46). Enlarged superior mediastinal and supraclavicular nodes, measuring up to 3.0 x 2.2 cm (series 2, image 32). Patulous, fluid-filled esophagus. Thyroid gland and trachea demonstrate no significant findings. Lungs/Pleura: Minimal centrilobular emphysema. Diffuse bilateral bronchial wall thickening. Unchanged trace left pleural effusion. Unchanged soft tissue mass and or lymphadenopathy of the paramedian suprahilar left upper lobe extending into the prevascular mediastinal soft tissues, measuring in total 5.6 x 3.9 cm and previously FDG avid (series 2, image 46). Multiple bilateral subsolid pulmonary nodules, several of the largest of which demonstrate some degree of cavitation, for example in the superior segment left lower lobe measuring 2.2 x 1.7 cm (series 3, image 46), and in the  superior segment right lower lobe measuring 1.2 x 1.1 cm (series 3, image 62). Upper Abdomen: No acute abnormality. Musculoskeletal: Mild pectus deformity. No acute osseous findings. Disc degenerative disease of the thoracic spine. IMPRESSION: 1. Unchanged soft tissue mass and or lymphadenopathy of the paramedian suprahilar left upper lobe extending into the prevascular mediastinal soft tissues, measuring in total 5.6 x 3.9 cm and previously FDG avid. 2. Enlarged superior mediastinal and supraclavicular nodes, consistent with nodal metastatic disease. 3. Multiple bilateral subsolid pulmonary nodules, several of the largest of which demonstrate some degree of cavitation. 4. Unchanged trace left pleural effusion. 5. Constellation of findings is consistent with advanced primary lung malignancy and is not significantly changed compared to recent prior PET-CT. 6. Unchanged moderate pericardial effusion. 7. Emphysema and diffuse bilateral bronchial wall thickening. 8. Cardiomegaly and coronary artery disease. 9. Patulous, fluid-filled esophagus. Correlate for gastroesophageal reflux. Aortic Atherosclerosis (ICD10-I70.0) and Emphysema (ICD10-J43.9). Electronically Signed   By: Delanna Ahmadi M.D.   On: 06/19/2022 21:24   NM PET Image Initial (PI) Skull Base To Thigh  Result Date: 06/16/2022 CLINICAL DATA:  Initial treatment strategy for lung mass and adenopathy. EXAM: NUCLEAR MEDICINE PET SKULL BASE TO THIGH TECHNIQUE: 6.2 mCi F-18 FDG was injected intravenously. Full-ring PET imaging was performed from the skull base to thigh after the radiotracer. CT data  was obtained and used for attenuation correction and anatomic localization. Fasting blood glucose: 116 mg/dl COMPARISON:  CT abdomen pelvis 05/25/2022 and CT chest 05/24/2022. FINDINGS: Mediastinal blood pool activity: SUV max 2.3 Liver activity: SUV max NA NECK: No abnormal hypermetabolism. Incidental CT findings: None. CHEST: Hypermetabolic right supraclavicular,  left internal jugular, mediastinal and left hilar adenopathy. Prevascular mass likely represents a combination of a left upper lobe mass, mediastinal invasion and prevascular adenopathy, collectively measuring 3.2 x 5.4 cm (2/63), SUV max, 8.1. Hypermetabolic pleural nodule in the inferomedial left hemithorax measures 1.4 x 2.4 cm (2/103), SUV max 6.3. Cystic and solid nodule in the superior segment left lower lobe measures 1.7 x 1.8 cm (2/62), with metabolism equivalent to mediastinal blood pool. Additional pulmonary nodules are ground-glass, subsolid or solid in character and are generally too small for PET resolution. Incidental CT findings: Moderate pericardial effusion. Atherosclerotic calcification of the aorta and coronary arteries. Enlarged pulmonic trunk. Trace left pleural fluid. ABDOMEN/PELVIS: No abnormal hypermetabolism. Incidental CT findings: Probable cysts in the left hepatic lobe. Liver, gallbladder, adrenal glands and right kidney are otherwise unremarkable. Stone in the left kidney. Visualized portions of the spleen, pancreas, stomach and bowel are grossly unremarkable. A large duodenal diverticulum is incidentally noted. SKELETON: No abnormal hypermetabolism. Incidental CT findings: Postoperative changes in the spine. Degenerative changes in the spine. IMPRESSION: 1. 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 No evidence of metastatic disease in the abdomen or pelvis. 2. Moderate pericardial effusion. 3. Tiny left pleural effusion. 4. Left renal stone. 5. Aortic atherosclerosis (ICD10-I70.0). Coronary artery calcification. 6. Enlarged pulmonic trunk, indicative of pulmonary arterial hypertension. Electronically Signed   By:  Lorin Picket M.D.   On: 06/16/2022 14:11

## 2022-06-28 NOTE — Pre-Procedure Instructions (Signed)
TC to patient Regina Perkins, she stated that she went to see Dr. Tasia Catchings today and told her that she has stage IV cancer in her lungs and that she does not need to do any additional procedures. She stated that Dr. Tasia Catchings told her she would contact Dr. Lanney Gins, called his office to inform above information and spoke with April at the front desk. Will cancel this appointment for PAT.

## 2022-06-28 NOTE — Assessment & Plan Note (Addendum)
Biopsy diagnosis were reviewed and discussed with patient.  Non-small cell lung cancer, favor squamous cell carcinoma, stage IV Recommend MRI brain with and without contrast Refer to radiation oncology for palliative  radiation-prevascular bulky disease Recommend systematic chemotherapy + immunotherapy. Patient has PS of 1-2, elderly, not a great candidate for aggressive chemotherapy.  I recommend immunotherapy +/- dose reduced single chemotherapy ie carboplatin Check NGS  Refer to palliative care

## 2022-06-28 NOTE — Assessment & Plan Note (Signed)
Refer to nutritionist 

## 2022-06-28 NOTE — Progress Notes (Signed)
Met with patient during follow up visit with Dr. Tasia Catchings to discuss recent biopsy results. All questions answered during visit. Reviewed upcoming appts with Regina Perkins and her granddaughter. Informed that will be in touch once molecular testing has been reported. Contact info given and instructed to call with any questions or needs. Regina Perkins and her granddaughter verbalized understanding.

## 2022-06-28 NOTE — Assessment & Plan Note (Signed)
Discussed with patient and granddaughter

## 2022-06-28 NOTE — Telephone Encounter (Signed)
[employer] tesing( xR + xT with PD-L1) request submitted via [employer] portal.  Specimen: [id number]  Application for financial assistance submitted and approved. Pt's out of pocket cost will be $0.

## 2022-06-28 NOTE — Patient Instructions (Signed)
Your procedure is scheduled on: Wednesday July 07, 2022. Report to the Registration Desk on the 1st floor of the [facility]. To find out your arrival time, please call [phone] between 1PM - 3PM on: Tuesday July 06, 2022. If your arrival time is 6:00 am, do not arrive before that time as the [facility] entrance doors do not open until 6:00 am.  REMEMBER: Instructions that are not followed completely may result in serious medical risk, up to and including death; or upon the discretion of your surgeon and anesthesiologist your surgery may need to be rescheduled.  Do not eat food after midnight the night before surgery.  No gum chewing or hard candies.  You may however, drink CLEAR liquids up to 2 hours before you are scheduled to arrive for your surgery. Do not drink anything within 2 hours of your scheduled arrival time.  Clear liquids include: - water   One week prior to surgery: Stop Anti-inflammatories (NSAIDS) such as Advil, Aleve, Ibuprofen, Motrin, Naproxen, Naprosyn and Aspirin based products such as Excedrin, Goody's Powder, BC Powder. Stop ANY OVER THE COUNTER supplements until after surgery. You may however, continue to take Tylenol if needed for pain up until the day of surgery.  Continue taking all prescribed medications with the exception of the following: Stop metFORMIN (GLUCOPHAGE) 1000 MG 2 days prior to surgery (take last dose Sunday 07/04/22)  Follow recommendations from Cardiologist or PCP regarding stopping blood thinners.  TAKE ONLY THESE MEDICATIONS THE MORNING OF SURGERY WITH A SIP OF WATER:  amLODipine (NORVASC) 10 MG    Use inhalers on the day of surgery and bring to the hospital. albuterol (VENTOLIN HFA) 108 (90 Base) MCG/ACT inhaler   No Alcohol for 24 hours before or after surgery.  No Smoking including e-cigarettes for 24 hours before surgery.  No chewable tobacco products for at least 6 hours before surgery.  No nicotine patches on the day  of surgery.  Do not use any "recreational" drugs for at least a week (preferably 2 weeks) before your surgery.  Please be advised that the combination of cocaine and anesthesia may have negative outcomes, up to and including death. If you test positive for cocaine, your surgery will be cancelled.  On the morning of surgery brush your teeth with toothpaste and water, you may rinse your mouth with mouthwash if you wish. Do not swallow any toothpaste or mouthwash.  Use CHG Soap or wipes as directed on instruction sheet.  Do not wear jewelry, make-up, hairpins, clips or nail polish.  Do not wear lotions, powders, or perfumes.   Do not shave body hair from the neck down 48 hours before surgery.  Contact lenses, hearing aids and dentures may not be worn into surgery.  Do not bring valuables to the hospital. [facility] is not responsible for any missing/lost belongings or valuables.   Total Shoulder Arthroplasty:  use Benzoyl Peroxide 5% Gel as directed on instruction sheet.  Bring your C-PAP to the hospital in case you may have to spend the night.   Notify your doctor if there is any change in your medical condition (cold, fever, infection).  Wear comfortable clothing (specific to your surgery type) to the hospital.  After surgery, you can help prevent lung complications by doing breathing exercises.  Take deep breaths and cough every 1-2 hours. Your doctor may order a device called an Incentive Spirometer to help you take deep breaths. When coughing or sneezing, hold a pillow firmly against your  incision with both hands. This is called "splinting." Doing this helps protect your incision. It also decreases belly discomfort.  If you are being admitted to the hospital overnight, leave your suitcase in the car. After surgery it may be brought to your room.  In case of increased patient census, it may be necessary for you, the patient, to continue your postoperative care in the Same Day  Surgery department.  If you are being discharged the day of surgery, you will not be allowed to drive home. You will need a responsible individual to drive you home and stay with you for 24 hours after surgery.   If you are taking public transportation, you will need to have a responsible individual with you.  Please call the [facility]. at [phone] if you have any questions about these instructions.  Surgery Visitation Policy:  Patients undergoing a surgery or procedure may have two family members or support persons with them as long as the person is not COVID-19 positive or experiencing its symptoms.   Inpatient Visitation:    Visiting hours are 7 a.m. to 8 p.m. Up to four visitors are allowed at one time in a patient room. The visitors may rotate out with other people during the day. One designated support person (adult) may remain overnight.  Due to an increase in RSV and influenza rates and associated hospitalizations, children ages 21 and under will not be able to visit patients in [facility]. Masks continue to be strongly recommended.

## 2022-07-02 NOTE — Telephone Encounter (Signed)
PDL reports sent to scan.

## 2022-07-05 ENCOUNTER — Other Ambulatory Visit: Payer: Medicare Other

## 2022-07-05 ENCOUNTER — Encounter: Payer: Self-pay | Admitting: Oncology

## 2022-07-06 ENCOUNTER — Encounter: Payer: Medicare Other | Admitting: Hospice and Palliative Medicine

## 2022-07-07 ENCOUNTER — Ambulatory Visit
Admission: RE | Admit: 2022-07-07 | Discharge: 2022-07-07 | Disposition: A | Discharge status: Home / Self Care | Payer: Medicare Other | Source: Ambulatory Visit | Attending: Oncology | Admitting: Oncology

## 2022-07-07 ENCOUNTER — Ambulatory Visit: Admission: RE | Admit: 2022-07-07 | Payer: Medicare Other | Source: Ambulatory Visit

## 2022-07-07 ENCOUNTER — Encounter: Admission: RE | Payer: Self-pay | Source: Ambulatory Visit

## 2022-07-07 DIAGNOSIS — R918 Other nonspecific abnormal finding of lung field: Secondary | ICD-10-CM | POA: Diagnosis present

## 2022-07-07 SURGERY — BRONCHOSCOPY, WITH EBUS
Anesthesia: General

## 2022-07-07 MED ORDER — GADOBUTROL 1 MMOL/ML IV SOLN
5.0000 mL | Freq: Once | INTRAVENOUS | Status: AC | PRN
  Administered 2022-07-07: 5 mL via INTRAVENOUS

## 2022-07-08 ENCOUNTER — Ambulatory Visit
Admission: RE | Admit: 2022-07-08 | Discharge: 2022-07-08 | Disposition: A | Discharge status: Home / Self Care | Payer: Medicare Other | Source: Ambulatory Visit | Attending: Radiation Oncology | Admitting: Radiation Oncology

## 2022-07-08 ENCOUNTER — Other Ambulatory Visit: Payer: Self-pay | Admitting: Oncology

## 2022-07-08 ENCOUNTER — Encounter: Payer: Self-pay | Admitting: Oncology

## 2022-07-08 ENCOUNTER — Encounter: Payer: Self-pay | Admitting: Radiation Oncology

## 2022-07-08 VITALS — BP 155/85 | HR 108 | Temp 97.0°F | Wt 115.7 lb

## 2022-07-08 DIAGNOSIS — I129 Hypertensive chronic kidney disease with stage 1 through stage 4 chronic kidney disease, or unspecified chronic kidney disease: Secondary | ICD-10-CM | POA: Insufficient documentation

## 2022-07-08 DIAGNOSIS — E119 Type 2 diabetes mellitus without complications: Secondary | ICD-10-CM | POA: Insufficient documentation

## 2022-07-08 DIAGNOSIS — Z7984 Long term (current) use of oral hypoglycemic drugs: Secondary | ICD-10-CM | POA: Diagnosis not present

## 2022-07-08 DIAGNOSIS — C3482 Malignant neoplasm of overlapping sites of left bronchus and lung: Secondary | ICD-10-CM

## 2022-07-08 DIAGNOSIS — N1831 Chronic kidney disease, stage 3a: Secondary | ICD-10-CM | POA: Diagnosis not present

## 2022-07-08 DIAGNOSIS — E1122 Type 2 diabetes mellitus with diabetic chronic kidney disease: Secondary | ICD-10-CM | POA: Diagnosis not present

## 2022-07-08 DIAGNOSIS — F17210 Nicotine dependence, cigarettes, uncomplicated: Secondary | ICD-10-CM | POA: Diagnosis not present

## 2022-07-08 DIAGNOSIS — Z79899 Other long term (current) drug therapy: Secondary | ICD-10-CM | POA: Insufficient documentation

## 2022-07-08 DIAGNOSIS — Z923 Personal history of irradiation: Secondary | ICD-10-CM | POA: Diagnosis not present

## 2022-07-08 MED ORDER — PROCHLORPERAZINE MALEATE 10 MG PO TABS: 10.0000 mg | ORAL_TABLET | Freq: Four times a day (QID) | ORAL | 1 refills | Status: DC | PRN

## 2022-07-08 NOTE — Progress Notes (Signed)
START ON PATHWAY REGIMEN - Non-Small Cell Lung     A cycle is every 21 days:     Pembrolizumab   **Always confirm dose/schedule in your pharmacy ordering system**  Patient Characteristics: Stage IV Metastatic, Squamous, Molecular Analysis Completed, Alteration Present and Targeted Therapy Exhausted or EGFR Exon 20 Insertion or KRAS G12C or HER2 Present, and No Prior Chemo/Immunotherapy or No Alteration Present, PS = 2, Initial  Chemotherapy/Immunotherapy, No Alteration Present, PD-L1 Expression Positive ? 50% (TPS) Therapeutic Status: Stage IV Metastatic Histology: Squamous Cell Molecular Analysis Results: No Alteration Present ECOG Performance Status: 2 Chemotherapy/Immunotherapy Line of Therapy: Initial Chemotherapy/Immunotherapy PD-L1 Expression Status: PD-L1 Positive ? 50% (TPS) Intent of Therapy: Non-Curative / Palliative Intent, Discussed with Patient

## 2022-07-08 NOTE — Consult Note (Signed)
NEW PATIENT EVALUATION  Name: Regina Perkins  MRN: [id number]  Date:   07/08/2022     DOB: August 11, 1940   This 82 y.o. 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  REFERRING PHYSICIAN: [facility], [place]:  Chief Complaint  Patient presents with   Lung Cancer    Consult    DIAGNOSIS: The encounter diagnosis was Malignant neoplasm of overlapping sites of left lung ([place]).   PREVIOUS INVESTIGATIONS:  PET scan CT scans reviewed MRI of brain pending Clinical notes reviewed Pathology report reviewed  HPI: Patient is an 82 year old female who presented with increasing shortness of breath.  She recent was admitted to the hospital with again marked shortness of breath with oxygen saturation of 86% on room air.  She was found to have a left prevascular mass worrisome for neoplasm with mediastinal and hilar lymphadenopathy.  There was also multiple pulmonary nodules bilaterally.  PET scan demonstrated hypermetabolic prevascular mass involving the mediastinum and medial left upper lobe with hyper metabolic lymph node extending to the right supraclavicular station bilateral pulmonary nodules and a hypermetabolic pleural nodule in the left hemithorax indicative of stage IV primary bronchogenic carcinoma.  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  She is a candidate for immunotherapy with Keytruda.  She is seen today for evaluation of palliative radiation therapy.  She has been having some hematuria recently.  She specifically [place] chest pain cough or hemoptysis.  PLANNED TREATMENT REGIMEN: Palliative radiation therapy to chest versus Keytruda therapy upfront  PAST MEDICAL HISTORY:  has  a past medical history of Chronic kidney disease, stage 3a ([place]), Diabetes mellitus without complication ([place]), HTN (hypertension), Hypertension, and Type II diabetes mellitus with renal manifestations ([place]).    PAST SURGICAL HISTORY: History reviewed. No pertinent surgical history.  FAMILY HISTORY: family history includes Asthma in her mother; Diabetes in her brother, mother, and sister; Heart disease in her mother; Hypertension in her mother; Stroke in her mother.  SOCIAL HISTORY:  reports that she has been smoking cigarettes. She has been smoking an average of .5 packs per day. She has never used smokeless tobacco. She reports that she does not drink alcohol and does not use drugs.  ALLERGIES: Ivp dye [iodinated contrast media] and Shellfish allergy  MEDICATIONS:  Current Outpatient Medications  Medication Sig Dispense Refill   albuterol (VENTOLIN HFA) 108 (90 Base) MCG/ACT inhaler Inhale 2 puffs into the lungs every 4 (four) hours as needed for wheezing or shortness of breath.     amLODipine (NORVASC) 10 MG tablet Take 10 mg by mouth daily.     benzonatate (TESSALON) 200 MG capsule Take 1 capsule (200 mg total) by mouth 3 (three) times daily as needed for cough. (Patient not taking: Reported on 06/28/2022) 20 capsule    BREZTRI AEROSPHERE 160-9-4.8 MCG/ACT AERO Inhale into the lungs.     fluticasone (FLONASE) 50 MCG/ACT nasal spray Place 1 spray into both nostrils daily.     Fluticasone-Umeclidin-Vilant (TRELEGY ELLIPTA) 100-62.5-25 MCG/ACT AEPB Inhale into the lungs.     furosemide (LASIX) 40 MG tablet Take 1 tablet (40 mg total) by mouth daily. 60 tablet 0   lisinopril (ZESTRIL) 40 MG tablet Take 40 mg by mouth daily.     metFORMIN (GLUCOPHAGE) 1000 MG tablet  Take 1,000 mg by mouth 2 (two) times daily with a meal.     naproxen (NAPROSYN) 500 MG tablet Take 500 mg by mouth 2 (two) times daily as needed for mild pain.     predniSONE (DELTASONE) 20 MG tablet Take by mouth.      prochlorperazine (COMPAZINE) 10 MG tablet Take 1 tablet (10 mg total) by mouth every 6 (six) hours as needed for nausea or vomiting. 30 tablet 1   No current facility-administered medications for this encounter.    ECOG PERFORMANCE STATUS:  1 - Symptomatic but completely ambulatory  REVIEW OF SYSTEMS: Patient has a history of acute pulmonary embolus chronic renal disease stage IIIa type 2 diabetes hypokalemia hypomagnesia him pericardial effusion Patient denies any weight loss, fatigue, weakness, fever, chills or night sweats. Patient denies any loss of vision, blurred vision. Patient denies any ringing  of the ears or hearing loss. No irregular heartbeat. Patient denies heart murmur or history of fainting. Patient denies any chest pain or pain radiating to her upper extremities. Patient denies any shortness of breath, difficulty breathing at night, cough or hemoptysis. Patient denies any swelling in the lower legs. Patient denies any nausea vomiting, vomiting of blood, or coffee ground material in the vomitus. Patient denies any stomach pain. Patient states has had normal bowel movements no significant constipation or diarrhea. Patient denies any dysuria, hematuria or significant nocturia. Patient denies any problems walking, swelling in the joints or loss of balance. Patient denies any skin changes, loss of hair or loss of weight. Patient denies any excessive worrying or anxiety or significant depression. Patient denies any problems with insomnia. Patient denies excessive thirst, polyuria, polydipsia. Patient denies any swollen glands, patient denies easy bruising or easy bleeding. Patient denies any recent infections, allergies or URI. Patient "s visual fields have not changed significantly in recent time.   PHYSICAL EXAM: BP (!) 155/85 Comment: Patient states that she hasnt taken her BP medication today.  This BP is typical for her  Pulse (!) 108   Temp (!) 97 F (36.1 C)   Wt 115 lb 11.2 oz (52.5  kg)   BMI 21.86 kg/m  Thin slightly cachectic female in NAD.  Well-developed well-nourished patient in NAD. HEENT reveals PERLA, EOMI, discs not visualized.  Oral cavity is clear. No oral mucosal lesions are identified. Neck is clear without evidence of cervical or supraclavicular adenopathy. Lungs are clear to A&P. Cardiac examination is essentially unremarkable with regular rate and rhythm without murmur rub or thrill. Abdomen is benign with no organomegaly or masses noted. Motor sensory and DTR levels are equal and symmetric in the upper and lower extremities. Cranial nerves II through XII are grossly intact. Proprioception is intact. No peripheral adenopathy or edema is identified. No motor or sensory levels are noted. Crude visual fields are within normal range.  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  Would plan on delivering 40 Gray in 20 fractions.  I believe this will prevent further atelectasis possible he may prevent him hemoptysis and progression of pain in her chest.  Risks and benefits of treatment including cough skin reaction possible radiation esophagitis fatigue alteration of blood counts all reviewed in detail with the patient.  I personally  set up and ordered CT simulation.  I discussed the case with Dr. Tasia Catchings who will be offering Keytruda therapy after completion of radiation.  Another option would be to go to [facility] upfront and save radiation for palliation.  I discussed all options with the patient will discuss case with Dr. Tasia Catchings and make formal recommendation.  Tentatively set her up for simulation next week.  I would like to take this opportunity to thank you for allowing me to participate in the care of your  patient.Noreene Filbert, MD

## 2022-07-08 NOTE — Telephone Encounter (Signed)
Report sent to scan in media

## 2022-07-09 ENCOUNTER — Other Ambulatory Visit: Payer: Self-pay

## 2022-07-09 ENCOUNTER — Encounter: Payer: Self-pay | Admitting: Hospice and Palliative Medicine

## 2022-07-09 ENCOUNTER — Encounter: Payer: Self-pay | Admitting: *Deleted

## 2022-07-09 ENCOUNTER — Inpatient Hospital Stay: Payer: Medicare Other

## 2022-07-09 ENCOUNTER — Inpatient Hospital Stay (HOSPITAL_BASED_OUTPATIENT_CLINIC_OR_DEPARTMENT_OTHER): Payer: Medicare Other | Admitting: Hospice and Palliative Medicine

## 2022-07-09 VITALS — BP 112/61 | HR 87 | Temp 98.6°F | Resp 17 | Wt 115.0 lb

## 2022-07-09 DIAGNOSIS — C3482 Malignant neoplasm of overlapping sites of left bronchus and lung: Secondary | ICD-10-CM | POA: Diagnosis not present

## 2022-07-09 DIAGNOSIS — Z5112 Encounter for antineoplastic immunotherapy: Secondary | ICD-10-CM | POA: Diagnosis not present

## 2022-07-09 LAB — URINALYSIS, COMPLETE (UACMP) WITH MICROSCOPIC
Bilirubin Urine: NEGATIVE
Glucose, UA: NEGATIVE mg/dL
Ketones, ur: NEGATIVE mg/dL
Nitrite: NEGATIVE
Protein, ur: NEGATIVE mg/dL
Specific Gravity, Urine: 1.005 (ref 1.005–1.030)
WBC, UA: >50 WBC/hpf (ref 0–5)
pH: 5 (ref 5.0–8.0)

## 2022-07-09 LAB — COMPREHENSIVE METABOLIC PANEL
ALT: 10 U/L (ref 0–44)
AST: 13 U/L — ABNORMAL LOW (ref 15–41)
Albumin: 3.7 g/dL (ref 3.5–5.0)
Alkaline Phosphatase: 52 U/L (ref 38–126)
Anion gap: 10 (ref 5–15)
BUN: 27 mg/dL — ABNORMAL HIGH (ref 8–23)
CO2: 30 mmol/L (ref 22–32)
Calcium: 8.9 mg/dL (ref 8.9–10.3)
Chloride: 95 mmol/L — ABNORMAL LOW (ref 98–111)
Creatinine, Ser: 0.94 mg/dL (ref 0.44–1.00)
GFR, Estimated: >60 mL/min (ref >60)
Glucose, Bld: 100 mg/dL — ABNORMAL HIGH (ref 70–99)
Potassium: 3.9 mmol/L (ref 3.5–5.1)
Sodium: 135 mmol/L (ref 135–145)
Total Bilirubin: 0.4 mg/dL (ref 0.3–1.2)
Total Protein: 7.4 g/dL (ref 6.5–8.1)

## 2022-07-09 LAB — CBC WITH DIFFERENTIAL/PLATELET
Abs Immature Granulocytes: 0.05 10*3/uL (ref 0.00–0.07)
Basophils Absolute: 0 10*3/uL (ref 0.0–0.1)
Basophils Relative: 0 %
Eosinophils Absolute: 0.7 10*3/uL — ABNORMAL HIGH (ref 0.0–0.5)
Eosinophils Relative: 6 %
HCT: 31.9 % — ABNORMAL LOW (ref 36.0–46.0)
Hemoglobin: 10 g/dL — ABNORMAL LOW (ref 12.0–15.0)
Immature Granulocytes: 0 %
Lymphocytes Relative: 15 %
Lymphs Abs: 1.8 10*3/uL (ref 0.7–4.0)
MCH: 26.3 pg (ref 26.0–34.0)
MCHC: 31.3 g/dL (ref 30.0–36.0)
MCV: 83.9 fL (ref 80.0–100.0)
Monocytes Absolute: 0.8 10*3/uL (ref 0.1–1.0)
Monocytes Relative: 7 %
Neutro Abs: 8.2 10*3/uL — ABNORMAL HIGH (ref 1.7–7.7)
Neutrophils Relative %: 72 %
Platelets: 375 10*3/uL (ref 150–400)
RBC: 3.8 MIL/uL — ABNORMAL LOW (ref 3.87–5.11)
RDW: 17.5 % — ABNORMAL HIGH (ref 11.5–15.5)
WBC: 11.5 10*3/uL — ABNORMAL HIGH (ref 4.0–10.5)
nRBC: 0 % (ref 0.0–0.2)

## 2022-07-09 NOTE — Progress Notes (Signed)
Nutrition Assessment   Reason for Assessment:  Weight loss, lung cancer   ASSESSMENT:  82 year old female with stage IV lung cancer.  Past medical history of CKD stage 3, DM, HTN.  Planning immunotherapy and radiation  Met with patient and grand-daughter.  Patient reports for the last several months decreased intake. Only able to eat 1/2 sandwich and drink 3 premier protein shakes. Patient lives with grand-daughter.  Says that sometimes she feels nauseated. Gets full quick.  Reports bowels move regularly, every day.  No trouble chewing or swallowing.       Nutrition Focused Physical Exam:   Orbital Region: mild Buccal Region: mild Upper Arm Region: mild Thoracic and Lumbar Region: severe Temple Region: moderate Clavicle Bone Region: moderate Shoulder and Acromion Bone Region: moderate Scapular Bone Region: unable to assess Dorsal Hand: mild Patellar Region: normal Anterior Thigh Region: unable to assess Posterior Calf Region: normal Edema (RD assessment): none    Medications: metformin, compazine, lasix, predinsone   Labs: glucose 100   Anthropometrics:   Height: 61 inches Weight: 115 lg 11.2 oz UBW: 140 lb about 6 months ago BMI: 21  18% weight loss in the last 6 months, significant   Estimated Energy Needs  Kcals: 1500-1800 Protein: 75-90 g Fluid: 1500-1800 ml   NUTRITION DIAGNOSIS: Inadequate oral intake related to cancer as evidenced by 18% weight loss in the last 6 months and eating less than 75% estimated energy needs for > 1 month   MALNUTRITION DIAGNOSIS: Patient meets criteria for severe malnutrition in context of chronic illness as evidenced by 18% weight loss in 6 months and eating less than 75% of estimated energy needs   INTERVENTION:  Discussed ways to add calories and protein in diet.  Handout provided Recommend higher calorie shakes, 350 or higher.  Samples of ensure complete, boost plus and boost [facility] given to patient.   Contact  information provided   MONITORING, EVALUATION, GOAL: weight trends, intake   Next Visit: ~ April 10th during next infursion  Amori Colomb B. Zenia Resides, [place], [place] Registered Dietitian [phone]

## 2022-07-09 NOTE — Progress Notes (Signed)
Met with patient during follow up visit with Josh to discuss goals of care and possibly starting treatment with immunotherapy. All questions answered during visit. Reviewed upcoming appts. Nothing further needed at this time. Instructed to call with any questions or needs. Pt verbalized understanding.

## 2022-07-09 NOTE — Progress Notes (Signed)
Patient states she had a sharpe pain in her tummy with urgency to urinate on Tuesday. When she used the restroom "it was like I was having a baby with all of the blood" she did not seek professional help.

## 2022-07-09 NOTE — Progress Notes (Signed)
[place] at [facility] [phone] [phone]) [phone]   Name: Regina Perkins Date: 07/09/2022 MRN: [id number]  DOB: 1940/10/19  Patient Care Team: [facility] as PCP - General ([place]) Telford Nab, RN as Oncology Nurse Navigator    REASON FOR CONSULTATION: Regina Perkins is a 82 y.o. 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  Patient started on pembrolizumab.  She was referred to palliative care to address goals and manage ongoing symptoms.  SOCIAL HISTORY:     reports that she has been smoking cigarettes. She has been smoking an average of .5 packs per day. She has never used smokeless tobacco. She reports that she does not drink alcohol and does not use drugs.  Patient is widowed.  She lives at home with her granddaughter.  She has another granddaughter and daughter who live nearby.  She has a son who lives out of state.  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  ADVANCE DIRECTIVES:  Does not  CODE STATUS:   PAST MEDICAL HISTORY: Past Medical History:  Diagnosis Date   Chronic kidney disease, stage 3a ([place])    Diabetes mellitus without complication ([place])    HTN (hypertension)    Hypertension    Type II diabetes mellitus with renal manifestations ([place])     PAST SURGICAL HISTORY: No past surgical history on file.  HEMATOLOGY/ONCOLOGY HISTORY:  Oncology History  Malignant neoplasm of lung ([place])  05/24/2022 Imaging   CT chest angiogram showed 1. No evidence for pulmonary embolism. 2. Left upper lobe/prevascular mass worrisome for neoplasm. 3. Mediastinal and hilar lymphadenopathy. 4. Multiple pulmonary nodules measuring up to 11 mm worrisome for metastatic disease. 5. Small left pleural effusion. 6. Patchy ground-glass and airspace opacities in the left upper lobe worrisome for  infection. 7. Moderate-sized pericardial effusion. 8. Cholelithiasis. 9. Nonobstructing left renal calculus   05/25/2022 Imaging   CT abdomen pelvis wo contrast 1. No acute findings or explanation for the patient's symptoms. 2. No evidence of primary malignancy or metastatic disease within the abdomen or pelvis on noncontrast imaging.  3. Cholelithiasis without evidence of cholecystitis or biliary dilatation. 4. Nonobstructing left renal calculus. 5. Moderate stool throughout the colon suggesting constipation. 6.  Aortic Atherosclerosis    05/26/2022 Initial Diagnosis   Malignant neoplasm of lung   -05/22/2022 in the emergency room, she was found to have hypoxia with oxygen levels of 86% on room air, improved to 94 with 2 L of oxygen.  D-dimer was elevated at 1.29, troponin negative.  Lower extremity Doppler was negative for DVT.  VQ scan high probability PE (Large segmental perfusion defect of the left upper lobe without corresponding radiographic abnormality. Additional bilateral wedge-shaped subsegmental branch perfusion defects .  Patient was admitted and started on heparin Echocardiogram showed no RV strain.  Moderate pericardial effusion. Over her hospitalization, she continues to have shortness of breath with minimal exertion. 05/24/2022, CT chest angiogram showed left lung mass with bilateral lung nodules.   06/16/2022 Imaging   PET scan  1. 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 No evidence of metastatic disease in the abdomen or pelvis. 2. Moderate pericardial effusion. 3. Tiny left pleural  effusion. 4. Left renal stone. 5. Aortic atherosclerosis (ICD10-I70.0). Coronary artery calcification. 6. Enlarged pulmonic  trunk, indicative of pulmonary arterial hypertension    06/21/2022 Procedure   US guided liver right supraclvicular node biopsy showed Metastatic poorly differentiated carcinoma, favor squamous cell carcinoma, probably pulmonary origin.    06/28/2022 Cancer Staging   Staging form: Lung, AJCC 8th Edition - Clinical: Stage IVA (cT4, cN3, cM1a) - Signed by Earlie Server, MD on 06/28/2022   07/14/2022 -  Chemotherapy   Patient is on Treatment Plan : LUNG NSCLC Pembrolizumab (200) q21d       ALLERGIES:  is allergic to ivp dye [iodinated contrast media] and shellfish allergy.  MEDICATIONS:  Current Outpatient Medications  Medication Sig Dispense Refill   albuterol (VENTOLIN HFA) 108 (90 Base) MCG/ACT inhaler Inhale 2 puffs into the lungs every 4 (four) hours as needed for wheezing or shortness of breath.     amLODipine (NORVASC) 10 MG tablet Take 10 mg by mouth daily.     benzonatate (TESSALON) 200 MG capsule Take 1 capsule (200 mg total) by mouth 3 (three) times daily as needed for cough. (Patient not taking: Reported on 06/28/2022) 20 capsule    BREZTRI AEROSPHERE 160-9-4.8 MCG/ACT AERO Inhale into the lungs.     fluticasone (FLONASE) 50 MCG/ACT nasal spray Place 1 spray into both nostrils daily.     Fluticasone-Umeclidin-Vilant (TRELEGY ELLIPTA) 100-62.5-25 MCG/ACT AEPB Inhale into the lungs.     furosemide (LASIX) 40 MG tablet Take 1 tablet (40 mg total) by mouth daily. 60 tablet 0   lisinopril (ZESTRIL) 40 MG tablet Take 40 mg by mouth daily.     metFORMIN (GLUCOPHAGE) 1000 MG tablet Take 1,000 mg by mouth 2 (two) times daily with a meal.     naproxen (NAPROSYN) 500 MG tablet Take 500 mg by mouth 2 (two) times daily as needed for mild pain.     predniSONE (DELTASONE) 20 MG tablet Take by mouth.     prochlorperazine (COMPAZINE) 10 MG tablet Take 1 tablet (10 mg total) by mouth every 6 (six) hours as needed for nausea or vomiting. 30 tablet 1   No current facility-administered medications for this  visit.    VITAL SIGNS: There were no vitals taken for this visit. There were no vitals filed for this visit.  Estimated body mass index is 21.86 kg/m as calculated from the following:   Height as of 05/22/22: 5\' 1"  (1.549 m).   Weight as of 07/08/22: 115 lb 11.2 oz (52.5 kg).  LABS: CBC:    Component Value Date/Time   WBC 8.4 05/27/2022 0543   HGB 10.9 (L) 05/27/2022 0543   HGB 10.7 (L) 01/16/2012 1722   HCT 35.0 (L) 05/27/2022 0543   HCT 32.2 (L) 01/16/2012 1722   PLT 412 (H) 05/27/2022 0543   PLT 335 01/16/2012 1722   MCV 84.3 05/27/2022 0543   MCV 89 01/16/2012 1722   NEUTROABS 19.5 (H) 12/30/2015 1142   LYMPHSABS 0.4 (L) 12/30/2015 1142   MONOABS 0.5 12/30/2015 1142   EOSABS 0.0 12/30/2015 1142   BASOSABS 0.1 12/30/2015 1142   Comprehensive Metabolic Panel:    Component Value Date/Time   NA 138 05/27/2022 0543   NA 134 (L) 01/16/2012 1722   K 4.0 05/27/2022 0543   K 3.1 (L) 01/16/2012 1722   CL 101 05/27/2022 0543   CL 97 (L) 01/16/2012 1722   CO2 31 05/27/2022 0543   CO2 27 01/16/2012 1722   BUN 17 05/27/2022 0543  BUN 13 01/16/2012 1722   CREATININE 0.84 05/27/2022 0543   CREATININE 0.99 01/16/2012 1722   GLUCOSE 102 (H) 05/27/2022 0543   GLUCOSE 198 (H) 01/16/2012 1722   CALCIUM 8.9 05/27/2022 0543   CALCIUM 9.2 01/16/2012 1722   AST 17 12/31/2015 0423   AST 18 01/16/2012 1722   ALT 14 12/31/2015 0423   ALT 22 01/16/2012 1722   ALKPHOS 54 12/31/2015 0423   ALKPHOS 87 01/16/2012 1722   BILITOT 1.0 12/31/2015 0423   BILITOT 0.6 01/16/2012 1722   PROT 6.8 12/31/2015 0423   PROT 8.1 01/16/2012 1722   ALBUMIN 2.5 (L) 12/31/2015 0423   ALBUMIN 2.9 (L) 01/16/2012 1722    RADIOGRAPHIC STUDIES: [place] CORE BIOPSY (LYMPH NODES)  Result Date: 06/21/2022 INDICATION: right supraclavicular lymphadenopathy EXAM: Ultrasound-guided core needle biopsy of right supraclavicular lymph node MEDICATIONS: None. ANESTHESIA/SEDATION: Local analgesia COMPLICATIONS: None  immediate. PROCEDURE: Informed written consent was obtained from the patient after a thorough discussion of the procedural risks, benefits and alternatives. All questions were addressed. Maximal Sterile Barrier Technique was utilized including caps, mask, sterile gowns, sterile gloves, sterile drape, hand hygiene and skin antiseptic. A timeout was performed prior to the initiation of the procedure. The patient was placed supine on the exam table. Ultrasound of the right supraclavicular soft tissues demonstrated abnormal an enlarged supraclavicular lymph node. Skin entry site was marked, and the overlying skin was prepped draped in the standard sterile fashion. Local analgesia was obtained with 1% lidocaine. Using ultrasound guidance, core needle biopsy was performed of the right supraclavicular lymph node using an 18 gauge core biopsy device x6 total passes. Specimens were submitted in saline to pathology for further handling. Limited postprocedure imaging demonstrated no hematoma. A clean dressing was placed after manual hemostasis. The patient tolerated the procedure well without immediate complication. IMPRESSION: Successful ultrasound-guided core needle biopsy of abnormal right supraclavicular lymph node. Electronically Signed   By: Albin Felling M.D.   On: 06/21/2022 16:16   CT CHEST WO CONTRAST  Result Date: 06/19/2022 CLINICAL DATA:  Lung mass * Tracking Code: BO * EXAM: CT CHEST WITHOUT CONTRAST TECHNIQUE: Multidetector CT imaging of the chest was performed following the standard protocol without IV contrast. RADIATION DOSE REDUCTION: This exam was performed according to the departmental dose-optimization program which includes automated exposure control, adjustment of the mA and/or kV according to patient size and/or use of iterative reconstruction technique. COMPARISON:  PET-CT, 06/15/2022 FINDINGS: Cardiovascular: Aortic atherosclerosis. Cardiomegaly. Left and right coronary artery calcifications.  Unchanged moderate pericardial effusion. Mediastinum/Nodes: Prevascular and AP window soft tissue mass, confluent with left upper lobe mass and measuring in total 5.6 x 3.9 cm (series 2, image 46). Enlarged superior mediastinal and supraclavicular nodes, measuring up to 3.0 x 2.2 cm (series 2, image 32). Patulous, fluid-filled esophagus. Thyroid gland and trachea demonstrate no significant findings. Lungs/Pleura: Minimal centrilobular emphysema. Diffuse bilateral bronchial wall thickening. Unchanged trace left pleural effusion. Unchanged soft tissue mass and or lymphadenopathy of the paramedian suprahilar left upper lobe extending into the prevascular mediastinal soft tissues, measuring in total 5.6 x 3.9 cm and previously FDG avid (series 2, image 46). Multiple bilateral subsolid pulmonary nodules, several of the largest of which demonstrate some degree of cavitation, for example in the superior segment left lower lobe measuring 2.2 x 1.7 cm (series 3, image 46), and in the superior segment right lower lobe measuring 1.2 x 1.1 cm (series 3, image 62). Upper Abdomen: No acute abnormality. Musculoskeletal: Mild pectus deformity. No acute osseous  findings. Disc degenerative disease of the thoracic spine. IMPRESSION: 1. Unchanged soft tissue mass and or lymphadenopathy of the paramedian suprahilar left upper lobe extending into the prevascular mediastinal soft tissues, measuring in total 5.6 x 3.9 cm and previously FDG avid. 2. Enlarged superior mediastinal and supraclavicular nodes, consistent with nodal metastatic disease. 3. Multiple bilateral subsolid pulmonary nodules, several of the largest of which demonstrate some degree of cavitation. 4. Unchanged trace left pleural effusion. 5. Constellation of findings is consistent with advanced primary lung malignancy and is not significantly changed compared to recent prior PET-CT. 6. Unchanged moderate pericardial effusion. 7. Emphysema and diffuse bilateral bronchial  wall thickening. 8. Cardiomegaly and coronary artery disease. 9. Patulous, fluid-filled esophagus. Correlate for gastroesophageal reflux. Aortic Atherosclerosis (ICD10-I70.0) and Emphysema (ICD10-J43.9). Electronically Signed   By: Delanna Ahmadi M.D.   On: 06/19/2022 21:24   NM PET Image Initial (PI) Skull Base To Thigh  Result Date: 06/16/2022 CLINICAL DATA:  Initial treatment strategy for lung mass and adenopathy. EXAM: NUCLEAR MEDICINE PET SKULL BASE TO THIGH TECHNIQUE: 6.2 mCi F-18 FDG was injected intravenously. Full-ring PET imaging was performed from the skull base to thigh after the radiotracer. CT data was obtained and used for attenuation correction and anatomic localization. Fasting blood glucose: 116 mg/dl COMPARISON:  CT abdomen pelvis 05/25/2022 and CT chest 05/24/2022. FINDINGS: Mediastinal blood pool activity: SUV max 2.3 Liver activity: SUV max NA NECK: No abnormal hypermetabolism. Incidental CT findings: None. CHEST: Hypermetabolic right supraclavicular, left internal jugular, mediastinal and left hilar adenopathy. Prevascular mass likely represents a combination of a left upper lobe mass, mediastinal invasion and prevascular adenopathy, collectively measuring 3.2 x 5.4 cm (2/63), SUV max, 8.1. Hypermetabolic pleural nodule in the inferomedial left hemithorax measures 1.4 x 2.4 cm (2/103), SUV max 6.3. Cystic and solid nodule in the superior segment left lower lobe measures 1.7 x 1.8 cm (2/62), with metabolism equivalent to mediastinal blood pool. Additional pulmonary nodules are ground-glass, subsolid or solid in character and are generally too small for PET resolution. Incidental CT findings: Moderate pericardial effusion. Atherosclerotic calcification of the aorta and coronary arteries. Enlarged pulmonic trunk. Trace left pleural fluid. ABDOMEN/PELVIS: No abnormal hypermetabolism. Incidental CT findings: Probable cysts in the left hepatic lobe. Liver, gallbladder, adrenal glands and right  kidney are otherwise unremarkable. Stone in the left kidney. Visualized portions of the spleen, pancreas, stomach and bowel are grossly unremarkable. A large duodenal diverticulum is incidentally noted. SKELETON: No abnormal hypermetabolism. Incidental CT findings: Postoperative changes in the spine. Degenerative changes in the spine. IMPRESSION: 1. 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 No evidence of metastatic disease in the abdomen or pelvis. 2. Moderate pericardial effusion. 3. Tiny left pleural effusion. 4. Left renal stone. 5. Aortic atherosclerosis (ICD10-I70.0). Coronary artery calcification. 6. Enlarged pulmonic trunk, indicative of pulmonary arterial hypertension. Electronically Signed   By: Lorin Picket M.D.   On: 06/16/2022 14:11    PERFORMANCE STATUS (ECOG) : 2 - Symptomatic, <50% confined to bed  Review of Systems Unless otherwise noted, a complete review of systems is negative.  Physical Exam General: NAD Cardiovascular: Irregular Pulmonary: clear ant fields Abdomen: soft, nontender, + bowel sounds GU: no suprapubic tenderness Extremities: no edema, no joint deformities Skin: no rashes Neurological: Weakness but otherwise nonfocal  IMPRESSION: Introduced palliative care to  patient and granddaughter.  Patient recognizes that she has stage IV, incurable cancer.  She is familiar with cancer treatments both in her role as a retired Corporate treasurer in addition to her husband who died of lung cancer.  She states that her husband had significant difficulty undergoing cancer treatment and therefore she has some reluctance to pursue chemotherapy.  However, she would be in agreement with trial of immunotherapy and/for  radiation.  Immunotherapy is scheduled to start next week.  At baseline, she lives at home with her granddaughter.  She remains functionally independent with her own care.  Patient does not have advance directives.  ACP documents provided.  Patient is unsure about CODE STATUS but does not think that she would want to be intubated nor have her life prolonged artificial machines.  I encouraged her to think about decision-making and talk with her family about her wishes.  She will likely benefit from future completion of MOST form.  Symptomatically, she denies any distressing symptoms today.  She does report instance of hematuria with an episode of voiding last week.  She has not had any bleeding since.  She denies any urinary urgency frequency or dysuria.  Of note, she did have renal calculus noted on CT 05/25/2022.  PLAN: -Continue current scope of treatment -Patient is interested in pursuing immunotherapy -Will check labs and UA/culture today in light of recent hematuria -ACP documents -Will benefit from future MOST Form -Follow-up telephone visit 3 to 4 weeks  Addendum: Urine culture positive for E. coli.  Will start cephalexin 500 mg twice daily x 5 days   Patient expressed understanding and was in agreement with this plan. She also understands that She can call the clinic at any time with any questions, concerns, or complaints.     Time Total: 30 minutes  Visit consisted of counseling and education dealing with the complex and emotionally intense issues of symptom management and palliative care in the setting of serious and potentially life-threatening illness.Greater than 50%  of this time was spent counseling and coordinating care related to the above assessment and plan.  Signed by: Altha Harm, PhD, NP-C

## 2022-07-11 LAB — URINE CULTURE: Culture: >=100000 — AB

## 2022-07-12 ENCOUNTER — Inpatient Hospital Stay: Payer: Medicare Other

## 2022-07-12 ENCOUNTER — Encounter: Payer: Self-pay | Admitting: *Deleted

## 2022-07-12 MED ORDER — CEPHALEXIN 500 MG PO CAPS: 500.0000 mg | ORAL_CAPSULE | Freq: Two times a day (BID) | ORAL | 0 refills | Status: DC

## 2022-07-12 NOTE — Addendum Note (Signed)
Addended by: Irean Hong on: 07/12/2022 09:28 AM   Modules accepted: Orders

## 2022-07-12 NOTE — Progress Notes (Signed)
Pt made aware of results from UA/culture. Per Merrily Pew, will start antibiotic with keflex 500mg  BID x 5 days that was sent into her pharmacy. Instructed pt to start taking once picked up. Pt verbalized understanding.

## 2022-07-13 ENCOUNTER — Ambulatory Visit: Payer: Medicare Other

## 2022-07-13 ENCOUNTER — Inpatient Hospital Stay: Payer: Medicare Other

## 2022-07-13 ENCOUNTER — Telehealth: Payer: Self-pay | Admitting: Oncology

## 2022-07-13 NOTE — Telephone Encounter (Signed)
Patient came into clinic for an appt today. After appt Regina Perkins gave me phone numbers to call for family to pick her up. I spoke with two different family members and no one was able to pick Regina Perkins up. Our Lucianne Lei driver was able to give Regina Perkins a courtesy ride home.

## 2022-07-14 ENCOUNTER — Encounter: Payer: Self-pay | Admitting: *Deleted

## 2022-07-14 ENCOUNTER — Inpatient Hospital Stay: Payer: Medicare Other

## 2022-07-14 ENCOUNTER — Encounter: Payer: Self-pay | Admitting: Oncology

## 2022-07-14 ENCOUNTER — Inpatient Hospital Stay (HOSPITAL_BASED_OUTPATIENT_CLINIC_OR_DEPARTMENT_OTHER): Payer: Medicare Other | Admitting: Oncology

## 2022-07-14 VITALS — BP 127/75 | HR 99 | Temp 97.5°F | Resp 18 | Wt 115.5 lb

## 2022-07-14 DIAGNOSIS — C3482 Malignant neoplasm of overlapping sites of left bronchus and lung: Secondary | ICD-10-CM

## 2022-07-14 DIAGNOSIS — E43 Unspecified severe protein-calorie malnutrition: Secondary | ICD-10-CM | POA: Diagnosis not present

## 2022-07-14 DIAGNOSIS — Z5112 Encounter for antineoplastic immunotherapy: Secondary | ICD-10-CM | POA: Diagnosis not present

## 2022-07-14 LAB — CMP (CANCER CENTER ONLY)
ALT: 12 U/L (ref 0–44)
AST: 24 U/L (ref 15–41)
Albumin: 3.6 g/dL (ref 3.5–5.0)
Alkaline Phosphatase: 57 U/L (ref 38–126)
Anion gap: 12 (ref 5–15)
BUN: 31 mg/dL — ABNORMAL HIGH (ref 8–23)
CO2: 27 mmol/L (ref 22–32)
Calcium: 8.8 mg/dL — ABNORMAL LOW (ref 8.9–10.3)
Chloride: 94 mmol/L — ABNORMAL LOW (ref 98–111)
Creatinine: 1.07 mg/dL — ABNORMAL HIGH (ref 0.44–1.00)
GFR, Estimated: 52 mL/min — ABNORMAL LOW (ref >60)
Glucose, Bld: 189 mg/dL — ABNORMAL HIGH (ref 70–99)
Potassium: 3.4 mmol/L — ABNORMAL LOW (ref 3.5–5.1)
Sodium: 133 mmol/L — ABNORMAL LOW (ref 135–145)
Total Bilirubin: 0.6 mg/dL (ref 0.3–1.2)
Total Protein: 7.5 g/dL (ref 6.5–8.1)

## 2022-07-14 LAB — CBC WITH DIFFERENTIAL (CANCER CENTER ONLY)
Abs Immature Granulocytes: 0.04 10*3/uL (ref 0.00–0.07)
Basophils Absolute: 0.1 10*3/uL (ref 0.0–0.1)
Basophils Relative: 0 %
Eosinophils Absolute: 0.8 10*3/uL — ABNORMAL HIGH (ref 0.0–0.5)
Eosinophils Relative: 7 %
HCT: 32.2 % — ABNORMAL LOW (ref 36.0–46.0)
Hemoglobin: 10.3 g/dL — ABNORMAL LOW (ref 12.0–15.0)
Immature Granulocytes: 0 %
Lymphocytes Relative: 12 %
Lymphs Abs: 1.5 10*3/uL (ref 0.7–4.0)
MCH: 26.8 pg (ref 26.0–34.0)
MCHC: 32 g/dL (ref 30.0–36.0)
MCV: 83.9 fL (ref 80.0–100.0)
Monocytes Absolute: 0.7 10*3/uL (ref 0.1–1.0)
Monocytes Relative: 5 %
Neutro Abs: 9.3 10*3/uL — ABNORMAL HIGH (ref 1.7–7.7)
Neutrophils Relative %: 76 %
Platelet Count: 379 10*3/uL (ref 150–400)
RBC: 3.84 MIL/uL — ABNORMAL LOW (ref 3.87–5.11)
RDW: 18.3 % — ABNORMAL HIGH (ref 11.5–15.5)
WBC Count: 12.4 10*3/uL — ABNORMAL HIGH (ref 4.0–10.5)
nRBC: 0 % (ref 0.0–0.2)

## 2022-07-14 LAB — TSH: TSH: 1.274 u[IU]/mL (ref 0.350–4.500)

## 2022-07-14 MED ORDER — SODIUM CHLORIDE 0.9 % IV SOLN
Freq: Once | INTRAVENOUS | Status: AC
  Filled 2022-07-14: qty 250

## 2022-07-14 MED ORDER — SODIUM CHLORIDE 0.9 % IV SOLN
200.0000 mg | Freq: Once | INTRAVENOUS | Status: AC
  Administered 2022-07-14: 200 mg via INTRAVENOUS
  Filled 2022-07-14: qty 8

## 2022-07-14 NOTE — Assessment & Plan Note (Signed)
Refer to nutritionist 

## 2022-07-14 NOTE — Assessment & Plan Note (Addendum)
Biopsy diagnosis were reviewed and discussed with patient.  Non-small cell lung cancer, favor squamous cell carcinoma, stage IV- TPS 90%, TMB 10, KRAS G12V MRI brain with and without contrast -  Left occipital focus of prior hemorrhage with mild linear contrast enhancement. Possible small metastasis or microhemmorhagic event.  TPS 90%, recommend immunotherapy with Keytruda every 3 weeks.  Rationale and potential side effects were reviewed and discussed with patient.  She agrees with the plan. Discussed with radiation oncology Dr. Baruch Gouty, will hold off radiation at this point.  May consider lung radiation if disease progresses. Plan repeat CT scan after 4 cycles of treatment.

## 2022-07-14 NOTE — Progress Notes (Signed)
Hematology/Oncology Consult Note [phone] [phone]     CHIEF COMPLAINTS/PURPOSE OF CONSULTATION:  Lung cancer  ASSESSMENT & PLAN:   Malignant neoplasm of lung ([place]) Biopsy diagnosis were reviewed and discussed with patient.  Non-small cell lung cancer, favor squamous cell carcinoma, stage IV- TPS 90%, TMB 10, KRAS G12V MRI brain with and without contrast -  Left occipital focus of prior hemorrhage with mild linear contrast enhancement. Possible small metastasis or microhemmorhagic event.  TPS 90%, recommend immunotherapy with Keytruda every 3 weeks.  Discussed with radiation oncology Dr. Baruch Gouty, will hold off radiation at this point.  May consider lung radiation if disease progresses. Plan repeat CT scan after 4 cycles of treatment.    Malnutrition ([place]) Refer to nutritionist  Encounter for antineoplastic immunotherapy Immunotherapy treatment plan as listed above.  Orders Placed This Encounter  Procedures   CBC with Differential ([facility] Only)    Standing Status:   Future    Standing Expiration Date:   08/04/2023   CMP ([facility] only)    Standing Status:   Future    Standing Expiration Date:   08/04/2023   CBC with Differential ([facility] Only)    Standing Status:   Future    Standing Expiration Date:   08/25/2023   CMP ([facility] only)    Standing Status:   Future    Standing Expiration Date:   08/25/2023   T4    Standing Status:   Future    Standing Expiration Date:   08/25/2023   TSH    Standing Status:   Future    Standing Expiration Date:   08/25/2023   CBC with Differential ([facility] Only)    Standing Status:   Future    Standing Expiration Date:   07/14/2023   CMP ([facility] only)    Standing Status:   Future    Standing Expiration Date:   07/14/2023    All questions were answered. The patient knows to call the clinic with any problems, questions or concerns.  Earlie Server, MD, PhD [facility] Hematology  Oncology 07/14/2022       HISTORY OF PRESENTING ILLNESS:  Regina Perkins 82 y.o. female presents to establish care for lung cancer  I have reviewed her chart and materials related to her cancer extensively and collaborated history with the patient. Summary of oncologic history is as follows: Oncology History  Malignant neoplasm of lung ([place])  05/24/2022 Imaging   CT chest angiogram showed 1. No evidence for pulmonary embolism. 2. Left upper lobe/prevascular mass worrisome for neoplasm. 3. Mediastinal and hilar lymphadenopathy. 4. Multiple pulmonary nodules measuring up to 11 mm worrisome for metastatic disease. 5. Small left pleural effusion. 6. Patchy ground-glass and airspace opacities in the left upper lobe worrisome for infection. 7. Moderate-sized pericardial effusion. 8. Cholelithiasis. 9. Nonobstructing left renal calculus   05/25/2022 Imaging   CT abdomen pelvis wo contrast 1. No acute findings or explanation for the patient's symptoms. 2. No evidence of primary malignancy or metastatic disease within the abdomen or pelvis on noncontrast imaging.  3. Cholelithiasis without evidence of cholecystitis or biliary dilatation. 4. Nonobstructing left renal calculus. 5. Moderate stool throughout the colon suggesting constipation. 6.  Aortic Atherosclerosis    05/26/2022 Initial Diagnosis   Malignant neoplasm of lung - TPS 90%, TMB 10, KRAS G12V  -05/22/2022 in the emergency room, she was found to have hypoxia with oxygen levels of 86% on room air, improved to 94 with 2 L  of oxygen.  D-dimer was elevated at 1.29, troponin negative.  Lower extremity Doppler was negative for DVT.  VQ scan high probability PE (Large segmental perfusion defect of the left upper lobe without corresponding radiographic abnormality. Additional bilateral wedge-shaped subsegmental branch perfusion defects .  Patient was admitted and started on heparin Echocardiogram showed no RV strain.  Moderate pericardial  effusion. Over her hospitalization, she continues to have shortness of breath with minimal exertion. 05/24/2022, CT chest angiogram showed left lung mass with bilateral lung nodules.   06/16/2022 Imaging   PET scan  1. 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 No evidence of metastatic disease in the abdomen or pelvis. 2. Moderate pericardial effusion. 3. Tiny left pleural effusion. 4. Left renal stone. 5. Aortic atherosclerosis (ICD10-I70.0). Coronary artery calcification. 6. Enlarged pulmonic trunk, indicative of pulmonary arterial hypertension    06/21/2022 Procedure   US guided liver right supraclvicular node biopsy showed Metastatic poorly differentiated carcinoma, favor squamous cell carcinoma, probably pulmonary origin.    06/28/2022 Cancer Staging   Staging form: Lung, AJCC 8th Edition - Clinical: Stage IVA (cT4, cN3, cM1a) - Signed by Earlie Server, MD on 06/28/2022   07/14/2022 -  Chemotherapy   Patient is on Treatment Plan : LUNG NSCLC Pembrolizumab (200) q21d      Today patient is accompanied by her family member. She continues to have SOB with exertion.  Patient feels shortness of breath is better.  She has been using her inhalers.  No new complaints.   MEDICAL HISTORY:  Past Medical History:  Diagnosis Date   Chronic kidney disease, stage 3a ([place])    Diabetes mellitus without complication ([place])    HTN (hypertension)    Hypertension    Type II diabetes mellitus with renal manifestations ([place])     SURGICAL HISTORY: History reviewed. No pertinent surgical history.  SOCIAL HISTORY: Social History   Socioeconomic History   Marital status: Widowed    Spouse name: Not on file   Number of children: Not on  file   Years of education: Not on file   Highest education level: Not on file  Occupational History   Not on file  Tobacco Use   Smoking status: Every Day    Packs/day: .5    Types: Cigarettes   Smokeless tobacco: Never  Substance and Sexual Activity   Alcohol use: No   Drug use: No   Sexual activity: Not on file  Other Topics Concern   Not on file  Social History Narrative   Not on file   Social Determinants of Health   Financial Resource Strain: Low Risk  (06/28/2022)   Overall Financial Resource Strain (CARDIA)    Difficulty of Paying Living Expenses: Not very hard  Food Insecurity: Food Insecurity Present (06/28/2022)   Hunger Vital Sign    Worried About Running Out of Food in the Last Year: Often true    Ran Out of Food in the Last Year: Often true  Transportation Needs: Unmet Transportation Needs (07/13/2022)   PRAPARE - Hydrologist (Medical): Yes    Lack of Transportation (Non-Medical): Yes  Physical Activity: Not on file  Stress: No Stress Concern Present (06/28/2022)   [place]    Feeling of Stress :  Not at all  Social Connections: Not on file  Intimate Partner Violence: Not At Risk (06/28/2022)   Humiliation, Afraid, Rape, and Kick questionnaire    Fear of Current or Ex-Partner: No    Emotionally Abused: No    Physically Abused: No    Sexually Abused: No    FAMILY HISTORY: Family History  Problem Relation Age of Onset   Asthma Mother    Heart disease Mother    Hypertension Mother    Diabetes Mother    Stroke Mother    Diabetes Sister    Diabetes Brother     ALLERGIES:  is allergic to ivp dye [iodinated contrast media] and shellfish allergy.  MEDICATIONS:  Current Outpatient Medications  Medication Sig Dispense Refill   albuterol (VENTOLIN HFA) 108 (90 Base) MCG/ACT inhaler Inhale 2 puffs into the lungs every 4 (four) hours as needed for wheezing or shortness  of breath.     amLODipine (NORVASC) 10 MG tablet Take 10 mg by mouth daily.     benzonatate (TESSALON) 200 MG capsule Take 1 capsule (200 mg total) by mouth 3 (three) times daily as needed for cough. 20 capsule    BREZTRI AEROSPHERE 160-9-4.8 MCG/ACT AERO Inhale into the lungs.     cephALEXin (KEFLEX) 500 MG capsule Take 1 capsule (500 mg total) by mouth 2 (two) times daily. 10 capsule 0   fluticasone (FLONASE) 50 MCG/ACT nasal spray Place 1 spray into both nostrils daily.     Fluticasone-Umeclidin-Vilant (TRELEGY ELLIPTA) 100-62.5-25 MCG/ACT AEPB Inhale into the lungs.     furosemide (LASIX) 40 MG tablet Take 1 tablet (40 mg total) by mouth daily. 60 tablet 0   lisinopril (ZESTRIL) 40 MG tablet Take 40 mg by mouth daily.     metFORMIN (GLUCOPHAGE) 1000 MG tablet Take 1,000 mg by mouth 2 (two) times daily with a meal.     naproxen (NAPROSYN) 500 MG tablet Take 500 mg by mouth 2 (two) times daily as needed for mild pain.     prochlorperazine (COMPAZINE) 10 MG tablet Take 1 tablet (10 mg total) by mouth every 6 (six) hours as needed for nausea or vomiting. 30 tablet 1   No current facility-administered medications for this visit.    Review of Systems  Constitutional:  Negative for appetite change, chills, fatigue and fever.  HENT:   Negative for hearing loss and voice change.   Eyes:  Negative for eye problems.  Respiratory:  Positive for shortness of breath. Negative for chest tightness and cough.   Cardiovascular:  Negative for chest pain.  Gastrointestinal:  Negative for abdominal distention, abdominal pain and blood in stool.  Endocrine: Negative for hot flashes.  Genitourinary:  Negative for difficulty urinating and frequency.   Musculoskeletal:  Negative for arthralgias.  Skin:  Negative for itching and rash.  Neurological:  Negative for extremity weakness.  Hematological:  Negative for adenopathy.  Psychiatric/Behavioral:  Negative for confusion.      PHYSICAL EXAMINATION: ECOG  PERFORMANCE STATUS: 2 - Symptomatic, <50% confined to bed  Vitals:   07/14/22 1035  BP: 127/75  Pulse: 99  Resp: 18  Temp: (!) 97.5 F (36.4 C)  SpO2: 99%   Filed Weights   07/14/22 1035  Weight: 115 lb 8 oz (52.4 kg)    Physical Exam Constitutional:      General: She is not in acute distress.    Appearance: She is not diaphoretic.  HENT:     Head: Normocephalic and atraumatic.  Nose: Nose normal.     Mouth/Throat:     Pharynx: No oropharyngeal exudate.  Eyes:     General: No scleral icterus.    Pupils: Pupils are equal, round, and reactive to light.  Cardiovascular:     Rate and Rhythm: Normal rate and regular rhythm.     Heart sounds: No murmur heard. Pulmonary:     Effort: Pulmonary effort is normal. No respiratory distress.     Breath sounds: No wheezing.     Comments: Decreased breath sounds bilaterally Abdominal:     General: There is no distension.     Palpations: Abdomen is soft.     Tenderness: There is no abdominal tenderness.  Musculoskeletal:        General: Normal range of motion.     Cervical back: Normal range of motion and neck supple.  Skin:    General: Skin is warm and dry.     Findings: No erythema.  Neurological:     Mental Status: She is alert and oriented to person, place, and time.     Cranial Nerves: No cranial nerve deficit.     Motor: No abnormal muscle tone.     Coordination: Coordination normal.  Psychiatric:        Mood and Affect: Mood and affect normal.      LABORATORY DATA:  I have reviewed the data as listed    Latest Ref Rng & Units 07/14/2022   10:03 AM 07/09/2022    2:56 PM 05/27/2022    5:43 AM  CBC  WBC 4.0 - 10.5 K/uL 12.4  11.5  8.4   Hemoglobin 12.0 - 15.0 g/dL 10.3  10.0  10.9   Hematocrit 36.0 - 46.0 % 32.2  31.9  35.0   Platelets 150 - 400 K/uL 379  375  412       Latest Ref Rng & Units 07/14/2022   10:03 AM 07/09/2022    2:56 PM 05/27/2022    5:43 AM  CMP  Glucose 70 - 99 mg/dL 189  100  102   BUN 8 -  23 mg/dL 31  27  17    Creatinine 0.44 - 1.00 mg/dL 1.07  0.94  0.84   Sodium 135 - 145 mmol/L 133  135  138   Potassium 3.5 - 5.1 mmol/L 3.4  3.9  4.0   Chloride 98 - 111 mmol/L 94  95  101   CO2 22 - 32 mmol/L 27  30  31    Calcium 8.9 - 10.3 mg/dL 8.8  8.9  8.9   Total Protein 6.5 - 8.1 g/dL 7.5  7.4    Total Bilirubin 0.3 - 1.2 mg/dL 0.6  0.4    Alkaline Phos 38 - 126 U/L 57  52    AST 15 - 41 U/L 24  13    ALT 0 - 44 U/L 12  10       RADIOGRAPHIC STUDIES: I have personally reviewed the radiological images as listed and agreed with the findings in the report. MR Brain W Wo Contrast  Result Date: 07/11/2022 CLINICAL DATA:  Right frontal headache.  Lung carcinoma. EXAM: MRI HEAD WITHOUT AND WITH CONTRAST TECHNIQUE: Multiplanar, multiecho pulse sequences of the brain and surrounding structures were obtained without and with intravenous contrast. CONTRAST:  81mL GADAVIST GADOBUTROL 1 MMOL/ML IV SOLN COMPARISON:  None Available. FINDINGS: Brain: Small focus of chronic hemorrhage in the left occipital lobe. There is a linear focus of contrast enhancement also at the  site (series 15, image 24 and series 1001, image 60). No other parenchymal contrast enhancing focus. No acute infarct. There is multifocal hyperintense T2-weighted signal within the white matter. Generalized volume loss. 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 The midline structures are normal. Vascular: Normal flow voids. Skull and upper cervical spine: Normal marrow signal. Sinuses/Orbits: Negative. Other: None. IMPRESSION: 1. Left occipital focus of prior hemorrhage with mild linear contrast enhancement. This may indicate a small metastasis or sequela of a nonspecific microhemmorhagic event. 2. No other parenchymal contrast enhancing focus. 3. Chronic microvascular ischemia and volume loss. 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 Electronically Signed   By: Ulyses Jarred  M.D.   On: 07/11/2022 02:20   [place] CORE BIOPSY (LYMPH NODES)  Result Date: 06/21/2022 INDICATION: right supraclavicular lymphadenopathy EXAM: Ultrasound-guided core needle biopsy of right supraclavicular lymph node MEDICATIONS: None. ANESTHESIA/SEDATION: Local analgesia COMPLICATIONS: None immediate. PROCEDURE: Informed written consent was obtained from the patient after a thorough discussion of the procedural risks, benefits and alternatives. All questions were addressed. Maximal Sterile Barrier Technique was utilized including caps, mask, sterile gowns, sterile gloves, sterile drape, hand hygiene and skin antiseptic. A timeout was performed prior to the initiation of the procedure. The patient was placed supine on the exam table. Ultrasound of the right supraclavicular soft tissues demonstrated abnormal an enlarged supraclavicular lymph node. Skin entry site was marked, and the overlying skin was prepped draped in the standard sterile fashion. Local analgesia was obtained with 1% lidocaine. Using ultrasound guidance, core needle biopsy was performed of the right supraclavicular lymph node using an 18 gauge core biopsy device x6 total passes. Specimens were submitted in saline to pathology for further handling. Limited postprocedure imaging demonstrated no hematoma. A clean dressing was placed after manual hemostasis. The patient tolerated the procedure well without immediate complication. IMPRESSION: Successful ultrasound-guided core needle biopsy of abnormal right supraclavicular lymph node. Electronically Signed   By: Albin Felling M.D.   On: 06/21/2022 16:16   CT CHEST WO CONTRAST  Result Date: 06/19/2022 CLINICAL DATA:  Lung mass * Tracking Code: BO * EXAM: CT CHEST WITHOUT CONTRAST TECHNIQUE: Multidetector CT imaging of the chest was performed following the standard protocol without IV contrast. RADIATION DOSE REDUCTION: This exam was performed according to the departmental dose-optimization program  which includes automated exposure control, adjustment of the mA and/or kV according to patient size and/or use of iterative reconstruction technique. COMPARISON:  PET-CT, 06/15/2022 FINDINGS: Cardiovascular: Aortic atherosclerosis. Cardiomegaly. Left and right coronary artery calcifications. Unchanged moderate pericardial effusion. Mediastinum/Nodes: Prevascular and AP window soft tissue mass, confluent with left upper lobe mass and measuring in total 5.6 x 3.9 cm (series 2, image 46). Enlarged superior mediastinal and supraclavicular nodes, measuring up to 3.0 x 2.2 cm (series 2, image 32). Patulous, fluid-filled esophagus. Thyroid gland and trachea demonstrate no significant findings. Lungs/Pleura: Minimal centrilobular emphysema. Diffuse bilateral bronchial wall thickening. Unchanged trace left pleural effusion. Unchanged soft tissue mass and or lymphadenopathy of the paramedian suprahilar left upper lobe extending into the prevascular mediastinal soft tissues, measuring in total 5.6 x 3.9 cm and previously FDG avid (series 2, image 46). Multiple bilateral subsolid pulmonary nodules, several of the largest of which demonstrate some degree of cavitation, for example in the superior segment left lower lobe measuring 2.2 x 1.7 cm (series 3, image 46), and in the superior segment right lower lobe measuring 1.2 x 1.1 cm (series 3, image 62).  Upper Abdomen: No acute abnormality. Musculoskeletal: Mild pectus deformity. No acute osseous findings. Disc degenerative disease of the thoracic spine. IMPRESSION: 1. Unchanged soft tissue mass and or lymphadenopathy of the paramedian suprahilar left upper lobe extending into the prevascular mediastinal soft tissues, measuring in total 5.6 x 3.9 cm and previously FDG avid. 2. Enlarged superior mediastinal and supraclavicular nodes, consistent with nodal metastatic disease. 3. Multiple bilateral subsolid pulmonary nodules, several of the largest of which demonstrate some degree of  cavitation. 4. Unchanged trace left pleural effusion. 5. Constellation of findings is consistent with advanced primary lung malignancy and is not significantly changed compared to recent prior PET-CT. 6. Unchanged moderate pericardial effusion. 7. Emphysema and diffuse bilateral bronchial wall thickening. 8. Cardiomegaly and coronary artery disease. 9. Patulous, fluid-filled esophagus. Correlate for gastroesophageal reflux. Aortic Atherosclerosis (ICD10-I70.0) and Emphysema (ICD10-J43.9). Electronically Signed   By: Delanna Ahmadi M.D.   On: 06/19/2022 21:24   NM PET Image Initial (PI) Skull Base To Thigh  Result Date: 06/16/2022 CLINICAL DATA:  Initial treatment strategy for lung mass and adenopathy. EXAM: NUCLEAR MEDICINE PET SKULL BASE TO THIGH TECHNIQUE: 6.2 mCi F-18 FDG was injected intravenously. Full-ring PET imaging was performed from the skull base to thigh after the radiotracer. CT data was obtained and used for attenuation correction and anatomic localization. Fasting blood glucose: 116 mg/dl COMPARISON:  CT abdomen pelvis 05/25/2022 and CT chest 05/24/2022. FINDINGS: Mediastinal blood pool activity: SUV max 2.3 Liver activity: SUV max NA NECK: No abnormal hypermetabolism. Incidental CT findings: None. CHEST: Hypermetabolic right supraclavicular, left internal jugular, mediastinal and left hilar adenopathy. Prevascular mass likely represents a combination of a left upper lobe mass, mediastinal invasion and prevascular adenopathy, collectively measuring 3.2 x 5.4 cm (2/63), SUV max, 8.1. Hypermetabolic pleural nodule in the inferomedial left hemithorax measures 1.4 x 2.4 cm (2/103), SUV max 6.3. Cystic and solid nodule in the superior segment left lower lobe measures 1.7 x 1.8 cm (2/62), with metabolism equivalent to mediastinal blood pool. Additional pulmonary nodules are ground-glass, subsolid or solid in character and are generally too small for PET resolution. Incidental CT findings: Moderate  pericardial effusion. Atherosclerotic calcification of the aorta and coronary arteries. Enlarged pulmonic trunk. Trace left pleural fluid. ABDOMEN/PELVIS: No abnormal hypermetabolism. Incidental CT findings: Probable cysts in the left hepatic lobe. Liver, gallbladder, adrenal glands and right kidney are otherwise unremarkable. Stone in the left kidney. Visualized portions of the spleen, pancreas, stomach and bowel are grossly unremarkable. A large duodenal diverticulum is incidentally noted. SKELETON: No abnormal hypermetabolism. Incidental CT findings: Postoperative changes in the spine. Degenerative changes in the spine. IMPRESSION: 1. 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 No evidence of metastatic disease in the abdomen or pelvis. 2. Moderate pericardial effusion. 3. Tiny left pleural effusion. 4. Left renal stone. 5. Aortic atherosclerosis (ICD10-I70.0). Coronary artery calcification. 6. Enlarged pulmonic trunk, indicative of pulmonary arterial hypertension. Electronically Signed   By: Lorin Picket M.D.   On: 06/16/2022 14:11

## 2022-07-14 NOTE — Patient Instructions (Addendum)
[facility]  Discharge Instructions: Thank you for choosing [facility] to provide your oncology and hematology care.  If you have a lab appointment with the Cancer Center, please go directly to the Cancer Center and check in at the registration area.  Wear comfortable clothing and clothing appropriate for easy access to any Portacath or PICC line.   We strive to give you quality time with your provider. You may need to reschedule your appointment if you arrive late (15 or more minutes).  Arriving late affects you and other patients whose appointments are after yours.  Also, if you miss three or more appointments without notifying the office, you may be dismissed from the clinic at the provider's discretion.      For prescription refill requests, have your pharmacy contact our office and allow 72 hours for refills to be completed.    Today you received the following chemotherapy and/or immunotherapy agents Pembrolizumab (Keytruda).      To help prevent nausea and vomiting after your treatment, we encourage you to take your nausea medication as directed.  BELOW ARE SYMPTOMS THAT SHOULD BE REPORTED IMMEDIATELY: *FEVER GREATER THAN 100.4 F (38 C) OR HIGHER *CHILLS OR SWEATING *NAUSEA AND VOMITING THAT IS NOT CONTROLLED WITH YOUR NAUSEA MEDICATION *UNUSUAL SHORTNESS OF BREATH *UNUSUAL BRUISING OR BLEEDING *URINARY PROBLEMS (pain or burning when urinating, or frequent urination) *BOWEL PROBLEMS (unusual diarrhea, constipation, pain near the anus) TENDERNESS IN MOUTH AND THROAT WITH OR WITHOUT PRESENCE OF ULCERS (sore throat, sores in mouth, or a toothache) UNUSUAL RASH, SWELLING OR PAIN  UNUSUAL VAGINAL DISCHARGE OR ITCHING   Items with * indicate a potential emergency and should be followed up as soon as possible or go to the Emergency Department if any problems should occur.  Please show the CHEMOTHERAPY ALERT CARD or IMMUNOTHERAPY ALERT CARD  at check-in to the Emergency Department and triage nurse.  Should you have questions after your visit or need to cancel or reschedule your appointment, please contact [facility]  [phone] and follow the prompts.  Office hours are 8:00 a.m. to 4:30 p.m. Monday - Friday. Please note that voicemails left after 4:00 p.m. may not be returned until the following business day.  We are closed weekends and major holidays. You have access to a nurse at all times for urgent questions. Please call the main number to the clinic [phone] and follow the prompts.  For any non-urgent questions, you may also contact your provider using MyChart. We now offer e-Visits for anyone 18 and older to request care online for non-urgent symptoms. For details visit [email or web address].   Also download the MyChart app! Go to the app store, search "MyChart", open the app, select [facility], and log in with your MyChart username and password.   Pembrolizumab Injection What is this medication? PEMBROLIZUMAB (PEM broe LIZ ue mab) treats some types of cancer. It works by helping your immune system slow or stop the spread of cancer cells. It is a monoclonal antibody. This medicine may be used for other purposes; ask your health care provider or pharmacist if you have questions. COMMON BRAND NAME(S): Keytruda What should I tell my care team before I take this medication? They need to know if you have any of these conditions: Allogeneic stem cell transplant (uses someone else's stem cells) Autoimmune diseases, such as Crohn disease, ulcerative colitis, lupus History of chest radiation Nervous system problems, such as Guillain-Barre syndrome,   myasthenia gravis Organ transplant An unusual or allergic reaction to pembrolizumab, other medications, foods, dyes, or preservatives Pregnant or trying to get pregnant Breast-feeding How should I use this medication? This medication is  injected into a vein. It is given by your care team in a hospital or clinic setting. A special MedGuide will be given to you before each treatment. Be sure to read this information carefully each time. Talk to your care team about the use of this medication in children. While it may be prescribed for children as young as 6 months for selected conditions, precautions do apply. Overdosage: If you think you have taken too much of this medicine contact a poison control center or emergency room at once. NOTE: This medicine is only for you. Do not share this medicine with others. What if I miss a dose? Keep appointments for follow-up doses. It is important not to miss your dose. Call your care team if you are unable to keep an appointment. What may interact with this medication? Interactions have not been studied. This list may not describe all possible interactions. Give your health care provider a list of all the medicines, herbs, non-prescription drugs, or dietary supplements you use. Also tell them if you smoke, drink alcohol, or use illegal drugs. Some items may interact with your medicine. What should I watch for while using this medication? Your condition will be monitored carefully while you are receiving this medication. You may need blood work while taking this medication. This medication may cause serious skin reactions. They can happen weeks to months after starting the medication. Contact your care team right away if you notice fevers or flu-like symptoms with a rash. The rash may be red or purple and then turn into blisters or peeling of the skin. You may also notice a red rash with swelling of the face, lips, or lymph nodes in your neck or under your arms. Tell your care team right away if you have any change in your eyesight. Talk to your care team if you may be pregnant. Serious birth defects can occur if you take this medication during pregnancy and for 4 months after the last dose. You  will need a negative pregnancy test before starting this medication. Contraception is recommended while taking this medication and for 4 months after the last dose. Your care team can help you find the option that works for you. Do not breastfeed while taking this medication and for 4 months after the last dose. What side effects may I notice from receiving this medication? Side effects that you should report to your care team as soon as possible: Allergic reactions--skin rash, itching, hives, swelling of the face, lips, tongue, or throat Dry cough, shortness of breath or trouble breathing Eye pain, redness, irritation, or discharge with blurry or decreased vision Heart muscle inflammation--unusual weakness or fatigue, shortness of breath, chest pain, fast or irregular heartbeat, dizziness, swelling of the ankles, feet, or hands Hormone gland problems--headache, sensitivity to light, unusual weakness or fatigue, dizziness, fast or irregular heartbeat, increased sensitivity to cold or heat, excessive sweating, constipation, hair loss, increased thirst or amount of urine, tremors or shaking, irritability Infusion reactions--chest pain, shortness of breath or trouble breathing, feeling faint or lightheaded Kidney injury (glomerulonephritis)--decrease in the amount of urine, red or dark brown urine, foamy or bubbly urine, swelling of the ankles, hands, or feet Liver injury--right upper belly pain, loss of appetite, nausea, light-colored stool, dark yellow or brown urine, yellowing skin or eyes,   unusual weakness or fatigue Pain, tingling, or numbness in the hands or feet, muscle weakness, change in vision, confusion or trouble speaking, loss of balance or coordination, trouble walking, seizures Rash, fever, and swollen lymph nodes Redness, blistering, peeling, or loosening of the skin, including inside the mouth Sudden or severe stomach pain, bloody diarrhea, fever, nausea, vomiting Side effects that  usually do not require medical attention (report to your care team if they continue or are bothersome): Bone, joint, or muscle pain Diarrhea Fatigue Loss of appetite Nausea Skin rash This list may not describe all possible side effects. Call your doctor for medical advice about side effects. You may report side effects to FDA at 1-800-FDA-1088. Where should I keep my medication? This medication is given in a hospital or clinic. It will not be stored at home. NOTE: This sheet is a summary. It may not cover all possible information. If you have questions about this medicine, talk to your doctor, pharmacist, or health care provider.  2023 Elsevier/Gold Standard (2021-08-25 00:00:00)  

## 2022-07-14 NOTE — Assessment & Plan Note (Signed)
Immunotherapy treatment plan as listed above.

## 2022-07-15 ENCOUNTER — Telehealth: Payer: Self-pay

## 2022-07-15 LAB — T4: T4, Total: 9.9 ug/dL (ref 4.5–12.0)

## 2022-07-15 NOTE — Telephone Encounter (Signed)
Telephone call to patient for follow up after receiving first infusion.   Patient states infusion went great.  States eating good and drinking plenty of fluids.   Denies any nausea or vomiting.  Encouraged patient to call for any concerns or questions. 

## 2022-07-20 ENCOUNTER — Encounter: Payer: Self-pay | Admitting: Oncology

## 2022-07-21 ENCOUNTER — Inpatient Hospital Stay: Payer: Medicare Other

## 2022-07-21 ENCOUNTER — Encounter: Payer: Self-pay | Admitting: Oncology

## 2022-07-21 ENCOUNTER — Inpatient Hospital Stay (HOSPITAL_BASED_OUTPATIENT_CLINIC_OR_DEPARTMENT_OTHER): Payer: Medicare Other | Admitting: Oncology

## 2022-07-21 VITALS — BP 123/77 | HR 106 | Temp 97.4°F | Resp 18 | Wt 116.2 lb

## 2022-07-21 DIAGNOSIS — D649 Anemia, unspecified: Secondary | ICD-10-CM

## 2022-07-21 DIAGNOSIS — Z5112 Encounter for antineoplastic immunotherapy: Secondary | ICD-10-CM | POA: Diagnosis not present

## 2022-07-21 DIAGNOSIS — C3482 Malignant neoplasm of overlapping sites of left bronchus and lung: Secondary | ICD-10-CM | POA: Diagnosis not present

## 2022-07-21 DIAGNOSIS — E43 Unspecified severe protein-calorie malnutrition: Secondary | ICD-10-CM

## 2022-07-21 DIAGNOSIS — I2699 Other pulmonary embolism without acute cor pulmonale: Secondary | ICD-10-CM

## 2022-07-21 LAB — CBC WITH DIFFERENTIAL (CANCER CENTER ONLY)
Abs Immature Granulocytes: 0.04 10*3/uL (ref 0.00–0.07)
Basophils Absolute: 0.1 10*3/uL (ref 0.0–0.1)
Basophils Relative: 1 %
Eosinophils Absolute: 0.3 10*3/uL (ref 0.0–0.5)
Eosinophils Relative: 3 %
HCT: 33.6 % — ABNORMAL LOW (ref 36.0–46.0)
Hemoglobin: 10.6 g/dL — ABNORMAL LOW (ref 12.0–15.0)
Immature Granulocytes: 0 %
Lymphocytes Relative: 12 %
Lymphs Abs: 1.3 10*3/uL (ref 0.7–4.0)
MCH: 26.6 pg (ref 26.0–34.0)
MCHC: 31.5 g/dL (ref 30.0–36.0)
MCV: 84.2 fL (ref 80.0–100.0)
Monocytes Absolute: 1 10*3/uL (ref 0.1–1.0)
Monocytes Relative: 9 %
Neutro Abs: 8.1 10*3/uL — ABNORMAL HIGH (ref 1.7–7.7)
Neutrophils Relative %: 75 %
Platelet Count: 383 10*3/uL (ref 150–400)
RBC: 3.99 MIL/uL (ref 3.87–5.11)
RDW: 17.9 % — ABNORMAL HIGH (ref 11.5–15.5)
WBC Count: 10.8 10*3/uL — ABNORMAL HIGH (ref 4.0–10.5)
nRBC: 0 % (ref 0.0–0.2)

## 2022-07-21 LAB — CMP (CANCER CENTER ONLY)
ALT: 16 U/L (ref 0–44)
AST: 21 U/L (ref 15–41)
Albumin: 3.8 g/dL (ref 3.5–5.0)
Alkaline Phosphatase: 70 U/L (ref 38–126)
Anion gap: 10 (ref 5–15)
BUN: 24 mg/dL — ABNORMAL HIGH (ref 8–23)
CO2: 29 mmol/L (ref 22–32)
Calcium: 9.5 mg/dL (ref 8.9–10.3)
Chloride: 95 mmol/L — ABNORMAL LOW (ref 98–111)
Creatinine: 0.94 mg/dL (ref 0.44–1.00)
GFR, Estimated: >60 mL/min (ref >60)
Glucose, Bld: 191 mg/dL — ABNORMAL HIGH (ref 70–99)
Potassium: 3.8 mmol/L (ref 3.5–5.1)
Sodium: 134 mmol/L — ABNORMAL LOW (ref 135–145)
Total Bilirubin: 0.5 mg/dL (ref 0.3–1.2)
Total Protein: 7.8 g/dL (ref 6.5–8.1)

## 2022-07-21 NOTE — Assessment & Plan Note (Addendum)
Biopsy diagnosis were reviewed and discussed with patient.  Non-small cell lung cancer, favor squamous cell carcinoma, stage IV- TPS 90%, TMB 10, KRAS G12V MRI brain with and without contrast -  Left occipital focus of prior hemorrhage with mild linear contrast enhancement. Possible small metastasis or microhemmorhagic event.  TPS 90%, Labs are reviewed and discussed with patient. She tolerates immunotherapy

## 2022-07-21 NOTE — Assessment & Plan Note (Signed)
Chronic anemia. Check iron tibc ferritin, B12, folate.

## 2022-07-21 NOTE — Assessment & Plan Note (Signed)
Follow up with nutritionist.  

## 2022-07-21 NOTE — Progress Notes (Signed)
Pt here for follow up. No new concerns voiced.   

## 2022-07-21 NOTE — Assessment & Plan Note (Signed)
Continue Elqiuis 5mg  BID

## 2022-07-21 NOTE — Progress Notes (Signed)
Hematology/Oncology Consult Note [phone] Fax:([phone]) [phone]     CHIEF COMPLAINTS/PURPOSE OF CONSULTATION:  Stage IV Lung squamous cell carcinoma  ASSESSMENT & PLAN:   Cancer Staging  Malignant neoplasm of lung ([place]) Staging form: Lung, AJCC 8th Edition - Clinical: Stage IVA (cT4, cN3, cM1a) - Signed by Earlie Server, MD on 06/28/2022   Malignant neoplasm of lung [facility]) Biopsy diagnosis were reviewed and discussed with patient.  Non-small cell lung cancer, favor squamous cell carcinoma, stage IV- TPS 90%, TMB 10, KRAS G12V MRI brain with and without contrast -  Left occipital focus of prior hemorrhage with mild linear contrast enhancement. Possible small metastasis or microhemmorhagic event.  TPS 90%, Labs are reviewed and discussed with patient. She tolerates immunotherapy    Malnutrition ([place]) Follow up with nutritionist  Acute pulmonary embolism (H[facility]) Continue Elqiuis 5mg  BID  Normocytic anemia Chronic anemia. Check iron tibc ferritin, B12, folate.   No orders of the defined types were placed in this encounter.   All questions were answered. The patient knows to call the clinic with any problems, questions or concerns.  Earlie Server, MD, PhD [facility] Hematology Oncology 07/21/2022       HISTORY OF PRESENTING ILLNESS:  Regina Perkins 82 y.o. female presents to establish care for Stage IV Lung squamous cell carcinoma I have reviewed her chart and materials related to her cancer extensively and collaborated history with the patient. Summary of oncologic history is as follows: Oncology History  Malignant neoplasm of lung ([place])  05/24/2022 Imaging   CT chest angiogram showed 1. No evidence for pulmonary embolism. 2. Left upper lobe/prevascular mass worrisome for neoplasm. 3. Mediastinal and hilar lymphadenopathy. 4. Multiple pulmonary nodules measuring up to 11 mm worrisome for metastatic disease. 5. Small left pleural effusion. 6. Patchy ground-glass and  airspace opacities in the left upper lobe worrisome for infection. 7. Moderate-sized pericardial effusion. 8. Cholelithiasis. 9. Nonobstructing left renal calculus   05/25/2022 Imaging   CT abdomen pelvis wo contrast 1. No acute findings or explanation for the patient's symptoms. 2. No evidence of primary malignancy or metastatic disease within the abdomen or pelvis on noncontrast imaging.  3. Cholelithiasis without evidence of cholecystitis or biliary dilatation. 4. Nonobstructing left renal calculus. 5. Moderate stool throughout the colon suggesting constipation. 6.  Aortic Atherosclerosis    05/26/2022 Initial Diagnosis   Malignant neoplasm of lung - TPS 90%, TMB 10, KRAS G12V  -05/22/2022 in the emergency room, she was found to have hypoxia with oxygen levels of 86% on room air, improved to 94 with 2 L of oxygen.  D-dimer was elevated at 1.29, troponin negative.  Lower extremity Doppler was negative for DVT.  VQ scan high probability PE (Large segmental perfusion defect of the left upper lobe without corresponding radiographic abnormality. Additional bilateral wedge-shaped subsegmental branch perfusion defects .  Patient was admitted and started on heparin Echocardiogram showed no RV strain.  Moderate pericardial effusion. Over her hospitalization, she continues to have shortness of breath with minimal exertion. 05/24/2022, CT chest angiogram showed left lung mass with bilateral lung nodules.   06/16/2022 Imaging   PET scan  1. 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 No evidence of metastatic disease in the abdomen or pelvis. 2. Moderate pericardial effusion. 3. Tiny left pleural effusion. 4. Left  renal stone. 5. Aortic  atherosclerosis (ICD10-I70.0). Coronary artery calcification. 6. Enlarged pulmonic trunk, indicative of pulmonary arterial hypertension    06/21/2022 Procedure   US guided liver right supraclvicular node biopsy showed Metastatic poorly differentiated carcinoma, favor squamous cell carcinoma, probably pulmonary origin.    06/28/2022 Cancer Staging   Staging form: Lung, AJCC 8th Edition - Clinical: Stage IVA (cT4, cN3, cM1a) - Signed by Earlie Server, MD on 06/28/2022   07/14/2022 -  Chemotherapy   Patient is on Treatment Plan : LUNG NSCLC Pembrolizumab (200) q21d       INTERVAL HISTORY Regina Perkins is a 82 y.o. female who has above history reviewed by me today presents for follow up visit for Lung squamous cell carcinoma She tolerates [place] well. Denies skin rash, diarrhea.  chronic SOB with exertion, stable  She has been using her inhalers.  No new complaints.   MEDICAL HISTORY:  Past Medical History:  Diagnosis Date   Chronic kidney disease, stage 3a ([place])    Diabetes mellitus without complication ([place])    HTN (hypertension)    Hypertension    Type II diabetes mellitus with renal manifestations ([place])     SURGICAL HISTORY: History reviewed. No pertinent surgical history.  SOCIAL HISTORY: Social History   Socioeconomic History   Marital status: Widowed    Spouse name: Not on file   Number of children: Not on file   Years of education: Not on file   Highest education level: Not on file  Occupational History   Not on file  Tobacco Use   Smoking status: Every Day    Packs/day: .5    Types: Cigarettes   Smokeless tobacco: Never  Substance and Sexual Activity   Alcohol use: No   Drug use: No   Sexual activity: Not on file  Other Topics Concern   Not on file  Social History Narrative   Not on file   Social Determinants of Health   Financial Resource Strain: Low Risk  (06/28/2022)   Overall Financial Resource Strain (CARDIA)    Difficulty of Paying Living Expenses: Not  very hard  Food Insecurity: Food Insecurity Present (06/28/2022)   Hunger Vital Sign    Worried About Running Out of Food in the Last Year: Often true    Ran Out of Food in the Last Year: Often true  Transportation Needs: Unmet Transportation Needs (07/13/2022)   PRAPARE - Hydrologist (Medical): Yes    Lack of Transportation (Non-Medical): Yes  Physical Activity: Not on file  Stress: No Stress Concern Present (06/28/2022)   [place]    Feeling of Stress : Not at all  Social Connections: Not on file  Intimate Partner Violence: Not At Risk (06/28/2022)   Humiliation, Afraid, Rape, and Kick questionnaire    Fear of Current or Ex-Partner: No    Emotionally Abused: No    Physically Abused: No    Sexually Abused: No    FAMILY HISTORY: Family History  Problem Relation Age of Onset   Asthma Mother    Heart disease Mother    Hypertension Mother    Diabetes Mother    Stroke Mother    Diabetes Sister    Diabetes Brother     ALLERGIES:  is allergic to ivp dye [iodinated contrast media] and shellfish allergy.  MEDICATIONS:  Current Outpatient Medications  Medication Sig Dispense Refill   albuterol (VENTOLIN HFA) 108 (90 Base) MCG/ACT inhaler  Inhale 2 puffs into the lungs every 4 (four) hours as needed for wheezing or shortness of breath.     amLODipine (NORVASC) 10 MG tablet Take 10 mg by mouth daily.     benzonatate (TESSALON) 200 MG capsule Take 1 capsule (200 mg total) by mouth 3 (three) times daily as needed for cough. 20 capsule    BREZTRI AEROSPHERE 160-9-4.8 MCG/ACT AERO Inhale into the lungs.     fluticasone (FLONASE) 50 MCG/ACT nasal spray Place 1 spray into both nostrils daily.     Fluticasone-Umeclidin-Vilant (TRELEGY ELLIPTA) 100-62.5-25 MCG/ACT AEPB Inhale into the lungs.     furosemide (LASIX) 40 MG tablet Take 1 tablet (40 mg total) by mouth daily. 60 tablet 0   lisinopril  (ZESTRIL) 40 MG tablet Take 40 mg by mouth daily.     metFORMIN (GLUCOPHAGE) 1000 MG tablet Take 1,000 mg by mouth 2 (two) times daily with a meal.     naproxen (NAPROSYN) 500 MG tablet Take 500 mg by mouth 2 (two) times daily as needed for mild pain.     prochlorperazine (COMPAZINE) 10 MG tablet Take 1 tablet (10 mg total) by mouth every 6 (six) hours as needed for nausea or vomiting. 30 tablet 1   No current facility-administered medications for this visit.    Review of Systems  Constitutional:  Negative for appetite change, chills, fatigue and fever.  HENT:   Negative for hearing loss and voice change.   Eyes:  Negative for eye problems.  Respiratory:  Positive for shortness of breath. Negative for chest tightness and cough.   Cardiovascular:  Negative for chest pain.  Gastrointestinal:  Negative for abdominal distention, abdominal pain and blood in stool.  Endocrine: Negative for hot flashes.  Genitourinary:  Negative for difficulty urinating and frequency.   Musculoskeletal:  Negative for arthralgias.  Skin:  Negative for itching and rash.  Neurological:  Negative for extremity weakness.  Hematological:  Negative for adenopathy.  Psychiatric/Behavioral:  Negative for confusion.      PHYSICAL EXAMINATION: ECOG PERFORMANCE STATUS: 2 - Symptomatic, <50% confined to bed  Vitals:   07/21/22 1017  BP: 123/77  Pulse: (!) 106  Resp: 18  Temp: (!) 97.4 F (36.3 C)  SpO2: 100%   Filed Weights   07/21/22 1017  Weight: 116 lb 3.2 oz (52.7 kg)    Physical Exam Constitutional:      General: She is not in acute distress.    Appearance: She is not diaphoretic.  HENT:     Head: Normocephalic.  Eyes:     General: No scleral icterus.    Pupils: Pupils are equal, round, and reactive to light.  Cardiovascular:     Heart sounds: No murmur heard. Pulmonary:     Effort: Pulmonary effort is normal. No respiratory distress.     Comments: Decreased breath sounds  bilaterally Abdominal:     General: There is no distension.  Musculoskeletal:        General: Normal range of motion.     Cervical back: Normal range of motion.  Skin:    Findings: No erythema.  Neurological:     Mental Status: She is alert and oriented to person, place, and time. Mental status is at baseline.     Cranial Nerves: No cranial nerve deficit.     Motor: No abnormal muscle tone.  Psychiatric:        Mood and Affect: Mood and affect normal.      LABORATORY DATA:  I have  reviewed the data as listed    Latest Ref Rng & Units 07/21/2022    9:29 AM 07/14/2022   10:03 AM 07/09/2022    2:56 PM  CBC  WBC 4.0 - 10.5 K/uL 10.8  12.4  11.5   Hemoglobin 12.0 - 15.0 g/dL 10.6  10.3  10.0   Hematocrit 36.0 - 46.0 % 33.6  32.2  31.9   Platelets 150 - 400 K/uL 383  379  375       Latest Ref Rng & Units 07/21/2022    9:28 AM 07/14/2022   10:03 AM 07/09/2022    2:56 PM  CMP  Glucose 70 - 99 mg/dL 191  189  100   BUN 8 - 23 mg/dL 24  31  27    Creatinine 0.44 - 1.00 mg/dL 0.94  1.07  0.94   Sodium 135 - 145 mmol/L 134  133  135   Potassium 3.5 - 5.1 mmol/L 3.8  3.4  3.9   Chloride 98 - 111 mmol/L 95  94  95   CO2 22 - 32 mmol/L 29  27  30    Calcium 8.9 - 10.3 mg/dL 9.5  8.8  8.9   Total Protein 6.5 - 8.1 g/dL 7.8  7.5  7.4   Total Bilirubin 0.3 - 1.2 mg/dL 0.5  0.6  0.4   Alkaline Phos 38 - 126 U/L 70  57  52   AST 15 - 41 U/L 21  24  13    ALT 0 - 44 U/L 16  12  10       RADIOGRAPHIC STUDIES: I have personally reviewed the radiological images as listed and agreed with the findings in the report. MR Brain W Wo Contrast  Result Date: 07/11/2022 CLINICAL DATA:  Right frontal headache.  Lung carcinoma. EXAM: MRI HEAD WITHOUT AND WITH CONTRAST TECHNIQUE: Multiplanar, multiecho pulse sequences of the brain and surrounding structures were obtained without and with intravenous contrast. CONTRAST:  97mL GADAVIST GADOBUTROL 1 MMOL/ML IV SOLN COMPARISON:  None Available. FINDINGS: Brain:  Small focus of chronic hemorrhage in the left occipital lobe. There is a linear focus of contrast enhancement also at the site (series 15, image 24 and series 1001, image 60). No other parenchymal contrast enhancing focus. No acute infarct. There is multifocal hyperintense T2-weighted signal within the white matter. Generalized volume loss. 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 The midline structures are normal. Vascular: Normal flow voids. Skull and upper cervical spine: Normal marrow signal. Sinuses/Orbits: Negative. Other: None. IMPRESSION: 1. Left occipital focus of prior hemorrhage with mild linear contrast enhancement. This may indicate a small metastasis or sequela of a nonspecific microhemmorhagic event. 2. No other parenchymal contrast enhancing focus. 3. Chronic microvascular ischemia and volume loss. 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 Electronically Signed   By: Ulyses Jarred M.D.   On: 07/11/2022 02:20

## 2022-07-23 ENCOUNTER — Inpatient Hospital Stay (HOSPITAL_BASED_OUTPATIENT_CLINIC_OR_DEPARTMENT_OTHER): Payer: Medicare Other | Admitting: Hospice and Palliative Medicine

## 2022-07-23 ENCOUNTER — Other Ambulatory Visit: Payer: Self-pay | Admitting: Student in an Organized Health Care Education/Training Program

## 2022-07-23 DIAGNOSIS — C3482 Malignant neoplasm of overlapping sites of left bronchus and lung: Secondary | ICD-10-CM

## 2022-07-23 NOTE — Progress Notes (Signed)
Virtual Visit via Telephone Note  I connected with [employer] on 07/23/22 at  1:40 PM EDT by telephone and verified that I am speaking with the correct person using two identifiers.  Location: Patient: Home Provider: Clinic   I discussed the limitations, risks, security and privacy concerns of performing an evaluation and management service by telephone and the availability of in person appointments. I also discussed with the patient that there may be a patient responsible charge related to this service. The patient expressed understanding and agreed to proceed.   History of Present Illness: Regina Perkins is a 82 y.o. 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  Patient started on pembrolizumab.  She was referred to palliative care to address goals and manage ongoing symptoms.    Observations/Objective: I called and spoke with patient by phone.  Patient reports that she is doing okay today.  She denies any acute changes or concerns.  No distressing symptoms at present.  She reports her appetite is "fair."  She is drinking oral nutritional supplements with slight weight gain recently.  Patient reports stable performance status.  She denies falls.  She states that she gets short of breath at times with exertion but it quickly resolves with rest.  Assessment and Plan: Stage IV lung cancer -on treatment with Keytruda.  Sounds like patient is symptomatically stable.  Will continue to follow  Follow Up Instructions: Follow-up telephone visit 1 to 2 months   I discussed the assessment and treatment plan with the patient. The patient was provided an opportunity to ask questions and all were answered. The patient agreed with the plan and demonstrated an understanding of the instructions.   The patient was advised to call back or seek an in-person evaluation if the symptoms worsen or if the condition fails to improve as  anticipated.  I provided 5 minutes of non-face-to-face time during this encounter.   Irean Hong, NP

## 2022-07-27 ENCOUNTER — Emergency Department: Payer: Medicare Other

## 2022-07-27 ENCOUNTER — Encounter: Payer: Self-pay | Admitting: Emergency Medicine

## 2022-07-27 ENCOUNTER — Other Ambulatory Visit: Payer: Self-pay

## 2022-07-27 ENCOUNTER — Inpatient Hospital Stay
Admission: EM | Admit: 2022-07-27 | Discharge: 2022-07-29 | DRG: 378 | Disposition: A | Discharge status: Home / Self Care | Payer: Medicare Other | Attending: Internal Medicine | Admitting: Internal Medicine

## 2022-07-27 DIAGNOSIS — E440 Moderate protein-calorie malnutrition: Secondary | ICD-10-CM | POA: Diagnosis present

## 2022-07-27 DIAGNOSIS — Z8249 Family history of ischemic heart disease and other diseases of the circulatory system: Secondary | ICD-10-CM | POA: Diagnosis not present

## 2022-07-27 DIAGNOSIS — Z825 Family history of asthma and other chronic lower respiratory diseases: Secondary | ICD-10-CM | POA: Diagnosis not present

## 2022-07-27 DIAGNOSIS — N1831 Chronic kidney disease, stage 3a: Secondary | ICD-10-CM | POA: Diagnosis present

## 2022-07-27 DIAGNOSIS — N183 Chronic kidney disease, stage 3 unspecified: Secondary | ICD-10-CM

## 2022-07-27 DIAGNOSIS — D62 Acute posthemorrhagic anemia: Secondary | ICD-10-CM | POA: Diagnosis not present

## 2022-07-27 DIAGNOSIS — Z720 Tobacco use: Secondary | ICD-10-CM | POA: Diagnosis present

## 2022-07-27 DIAGNOSIS — E1122 Type 2 diabetes mellitus with diabetic chronic kidney disease: Secondary | ICD-10-CM | POA: Diagnosis present

## 2022-07-27 DIAGNOSIS — I10 Essential (primary) hypertension: Secondary | ICD-10-CM | POA: Diagnosis present

## 2022-07-27 DIAGNOSIS — Z823 Family history of stroke: Secondary | ICD-10-CM

## 2022-07-27 DIAGNOSIS — C3492 Malignant neoplasm of unspecified part of left bronchus or lung: Secondary | ICD-10-CM | POA: Diagnosis not present

## 2022-07-27 DIAGNOSIS — Z79899 Other long term (current) drug therapy: Secondary | ICD-10-CM

## 2022-07-27 DIAGNOSIS — Z716 Tobacco abuse counseling: Secondary | ICD-10-CM | POA: Diagnosis not present

## 2022-07-27 DIAGNOSIS — Z5941 Food insecurity: Secondary | ICD-10-CM

## 2022-07-27 DIAGNOSIS — Z6820 Body mass index (BMI) 20.0-20.9, adult: Secondary | ICD-10-CM

## 2022-07-27 DIAGNOSIS — I129 Hypertensive chronic kidney disease with stage 1 through stage 4 chronic kidney disease, or unspecified chronic kidney disease: Secondary | ICD-10-CM | POA: Diagnosis present

## 2022-07-27 DIAGNOSIS — K922 Gastrointestinal hemorrhage, unspecified: Principal | ICD-10-CM | POA: Diagnosis present

## 2022-07-27 DIAGNOSIS — E876 Hypokalemia: Secondary | ICD-10-CM | POA: Diagnosis present

## 2022-07-27 DIAGNOSIS — Z91013 Allergy to seafood: Secondary | ICD-10-CM | POA: Diagnosis not present

## 2022-07-27 DIAGNOSIS — F17210 Nicotine dependence, cigarettes, uncomplicated: Secondary | ICD-10-CM | POA: Diagnosis present

## 2022-07-27 DIAGNOSIS — Z90710 Acquired absence of both cervix and uterus: Secondary | ICD-10-CM | POA: Diagnosis not present

## 2022-07-27 DIAGNOSIS — I2699 Other pulmonary embolism without acute cor pulmonale: Secondary | ICD-10-CM | POA: Diagnosis present

## 2022-07-27 DIAGNOSIS — Z7901 Long term (current) use of anticoagulants: Secondary | ICD-10-CM | POA: Diagnosis not present

## 2022-07-27 DIAGNOSIS — Z833 Family history of diabetes mellitus: Secondary | ICD-10-CM

## 2022-07-27 DIAGNOSIS — D649 Anemia, unspecified: Secondary | ICD-10-CM | POA: Diagnosis present

## 2022-07-27 DIAGNOSIS — D631 Anemia in chronic kidney disease: Secondary | ICD-10-CM | POA: Diagnosis present

## 2022-07-27 DIAGNOSIS — Z5982 Transportation insecurity: Secondary | ICD-10-CM | POA: Diagnosis not present

## 2022-07-27 DIAGNOSIS — E1129 Type 2 diabetes mellitus with other diabetic kidney complication: Secondary | ICD-10-CM | POA: Diagnosis not present

## 2022-07-27 DIAGNOSIS — K264 Chronic or unspecified duodenal ulcer with hemorrhage: Principal | ICD-10-CM | POA: Diagnosis present

## 2022-07-27 DIAGNOSIS — C3482 Malignant neoplasm of overlapping sites of left bronchus and lung: Secondary | ICD-10-CM | POA: Diagnosis not present

## 2022-07-27 DIAGNOSIS — C3490 Malignant neoplasm of unspecified part of unspecified bronchus or lung: Secondary | ICD-10-CM | POA: Diagnosis present

## 2022-07-27 DIAGNOSIS — Z86711 Personal history of pulmonary embolism: Secondary | ICD-10-CM | POA: Diagnosis present

## 2022-07-27 DIAGNOSIS — Z7984 Long term (current) use of oral hypoglycemic drugs: Secondary | ICD-10-CM

## 2022-07-27 DIAGNOSIS — D72829 Elevated white blood cell count, unspecified: Secondary | ICD-10-CM | POA: Diagnosis present

## 2022-07-27 LAB — COMPREHENSIVE METABOLIC PANEL
ALT: 13 U/L (ref 0–44)
AST: 20 U/L (ref 15–41)
Albumin: 3.2 g/dL — ABNORMAL LOW (ref 3.5–5.0)
Alkaline Phosphatase: 51 U/L (ref 38–126)
Anion gap: 12 (ref 5–15)
BUN: 46 mg/dL — ABNORMAL HIGH (ref 8–23)
CO2: 27 mmol/L (ref 22–32)
Calcium: 8.7 mg/dL — ABNORMAL LOW (ref 8.9–10.3)
Chloride: 95 mmol/L — ABNORMAL LOW (ref 98–111)
Creatinine, Ser: 0.97 mg/dL (ref 0.44–1.00)
GFR, Estimated: 59 mL/min — ABNORMAL LOW (ref >60)
Glucose, Bld: 257 mg/dL — ABNORMAL HIGH (ref 70–99)
Potassium: 3.8 mmol/L (ref 3.5–5.1)
Sodium: 134 mmol/L — ABNORMAL LOW (ref 135–145)
Total Bilirubin: 0.4 mg/dL (ref 0.3–1.2)
Total Protein: 6.2 g/dL — ABNORMAL LOW (ref 6.5–8.1)

## 2022-07-27 LAB — HEMOGLOBIN AND HEMATOCRIT, BLOOD
HCT: 21.4 % — ABNORMAL LOW (ref 36.0–46.0)
HCT: 18.6 % — ABNORMAL LOW (ref 36.0–46.0)
Hemoglobin: 7 g/dL — ABNORMAL LOW (ref 12.0–15.0)
Hemoglobin: 6 g/dL — ABNORMAL LOW (ref 12.0–15.0)

## 2022-07-27 LAB — CBC
HCT: 24.6 % — ABNORMAL LOW (ref 36.0–46.0)
Hemoglobin: 7.7 g/dL — ABNORMAL LOW (ref 12.0–15.0)
MCH: 26.8 pg (ref 26.0–34.0)
MCHC: 31.3 g/dL (ref 30.0–36.0)
MCV: 85.7 fL (ref 80.0–100.0)
Platelets: 349 10*3/uL (ref 150–400)
RBC: 2.87 MIL/uL — ABNORMAL LOW (ref 3.87–5.11)
RDW: 17.7 % — ABNORMAL HIGH (ref 11.5–15.5)
WBC: 13.8 10*3/uL — ABNORMAL HIGH (ref 4.0–10.5)
nRBC: 0 % (ref 0.0–0.2)

## 2022-07-27 LAB — GLUCOSE, CAPILLARY
Glucose-Capillary: 74 mg/dL (ref 70–99)
Glucose-Capillary: 86 mg/dL (ref 70–99)
Glucose-Capillary: 110 mg/dL — ABNORMAL HIGH (ref 70–99)

## 2022-07-27 LAB — PROCALCITONIN: Procalcitonin: 0.37 ng/mL

## 2022-07-27 LAB — ABO/RH: ABO/RH(D): O POS

## 2022-07-27 MED ORDER — PANTOPRAZOLE SODIUM 40 MG IV SOLR: 40.0000 mg | Freq: Two times a day (BID) | INTRAVENOUS | Status: DC

## 2022-07-27 MED ORDER — PANTOPRAZOLE SODIUM 40 MG IV SOLR
40.0000 mg | Freq: Once | INTRAVENOUS | Status: AC
  Administered 2022-07-27: 40 mg via INTRAVENOUS
  Filled 2022-07-27: qty 10

## 2022-07-27 MED ORDER — DEXTROSE-NACL 5-0.45 % IV SOLN: INTRAVENOUS | Status: DC

## 2022-07-27 MED ORDER — INSULIN ASPART 100 UNIT/ML IJ SOLN
0.0000 [IU] | INTRAMUSCULAR | Status: DC
  Administered 2022-07-28 – 2022-07-29 (×2): 1 [IU] via SUBCUTANEOUS
  Filled 2022-07-27 (×3): qty 1

## 2022-07-27 MED ORDER — PANTOPRAZOLE INFUSION (NEW) - SIMPLE MED
8.0000 mg/h | INTRAVENOUS | Status: DC
  Administered 2022-07-27 – 2022-07-29 (×4): 8 mg/h via INTRAVENOUS
  Filled 2022-07-27 (×4): qty 100

## 2022-07-27 MED ORDER — ONDANSETRON HCL 4 MG/2ML IJ SOLN: 4.0000 mg | Freq: Four times a day (QID) | INTRAMUSCULAR | Status: DC | PRN

## 2022-07-27 MED ORDER — ONDANSETRON HCL 4 MG PO TABS: 4.0000 mg | ORAL_TABLET | Freq: Four times a day (QID) | ORAL | Status: DC | PRN

## 2022-07-27 MED ORDER — SODIUM CHLORIDE 0.9 % IV BOLUS
1000.0000 mL | Freq: Once | INTRAVENOUS | Status: AC
  Administered 2022-07-27: 1000 mL via INTRAVENOUS

## 2022-07-27 NOTE — Assessment & Plan Note (Signed)
Active smoker  Discussed cessation  Pt not ready to quit

## 2022-07-27 NOTE — Assessment & Plan Note (Signed)
Decompensated anemia in the setting of upper GI bleeding Hemoglobin 7.7 today with baseline hemoglobin around 10-11 Transfuse for hemoglobin less than 7 Follow

## 2022-07-27 NOTE — H&P (Addendum)
History and Physical    Patient: Regina Perkins [id number] DOB: 1941/04/18 DOA: 07/27/2022 DOS: the patient was seen and examined on 07/27/2022 [facility]: [facility]  Patient coming from: Home  Chief Complaint:  Chief Complaint  Patient presents with   Rectal Bleeding   Shortness of Breath   HPI: [name] is a 82 y.o. female with medical history significant of stage IV lung cancer, stage III CKD, type 2 diabetes, hypertension, recent diagnosis of acute PE on Eliquis, tobacco abuse presenting with upper GI bleeding.  Patient reports episode of bloody vomiting earlier today as well as having stools of black consistency of the past 24 hours.  No fevers or chills.  Minimal to mild abdominal pain.  No chest pain or shortness of breath.  Noted to have been evaluated back in February with concern for possible PE.  VQ scan was positive, however CTA was negative for PE.  Patient was continued on Eliquis in setting of active malignancy.  Patient denies any NSAID use.  Denies any tobacco use.  Baseline half pack per day smoker.  Patient not ready to quit.  Denies any episodes of prior GI bleeding in the past. Presented to the ER afebrile, hemodynamically stable.  Satting well on room air.  White count 13.8, hemoglobin 7.7, platelets 349.  Creatinine 0.97, glucose 257.  Chest x-ray with progressive upper lobe opacity-suspect malignancy. Review of Systems: As mentioned in the history of present illness. All other systems reviewed and are negative. Past Medical History:  Diagnosis Date   Chronic kidney disease, stage 3a    Diabetes mellitus without complication    HTN (hypertension)    Hypertension    Type II diabetes mellitus with renal manifestations    History reviewed. No pertinent surgical history. Social History:  reports that she has been smoking cigarettes. She has been smoking an average of .5 packs per day. She has never used smokeless tobacco. She reports that she  does not drink alcohol and does not use drugs.  Allergies  Allergen Reactions   Ivp Dye [Iodinated Contrast Media]    Shellfish Allergy Other (See Comments)    Reaction: unknown     Family History  Problem Relation Age of Onset   Asthma Mother    Heart disease Mother    Hypertension Mother    Diabetes Mother    Stroke Mother    Diabetes Sister    Diabetes Brother     Prior to Admission medications   Medication Sig Start Date End Date Taking? Authorizing Provider  albuterol (VENTOLIN HFA) 108 (90 Base) MCG/ACT inhaler Inhale 2 puffs into the lungs every 4 (four) hours as needed for wheezing or shortness of breath. 05/21/22  Yes [provider]  amLODipine (NORVASC) 10 MG tablet Take 10 mg by mouth daily.   Yes [provider]  benzonatate (TESSALON) 200 MG capsule Take 1 capsule (200 mg total) by mouth 3 (three) times daily as needed for cough. 05/23/22  Yes Alexander, Lanelle Bal, DO  BREZTRI AEROSPHERE 160-9-4.8 MCG/ACT AERO Inhale into the lungs. 06/03/22 06/03/23 Yes [provider]  fluticasone (FLONASE) 50 MCG/ACT nasal spray Place 1 spray into both nostrils daily.   Yes [provider]  Fluticasone-Umeclidin-Vilant (TRELEGY ELLIPTA) 100-62.5-25 MCG/ACT AEPB Inhale into the lungs. 06/03/22  Yes [provider]  furosemide (LASIX) 40 MG tablet Take 1 tablet (40 mg total) by mouth daily. 05/27/22 07/27/22 Yes Richarda Osmond, MD  lisinopril (ZESTRIL) 40 MG tablet  Take 40 mg by mouth daily.   Yes [provider]  metFORMIN (GLUCOPHAGE) 1000 MG tablet Take 1,000 mg by mouth 2 (two) times daily with a meal.   Yes [provider]  prochlorperazine (COMPAZINE) 10 MG tablet Take 1 tablet (10 mg total) by mouth every 6 (six) hours as needed for nausea or vomiting. 07/08/22  Yes Earlie Server, MD  naproxen (NAPROSYN) 500 MG tablet Take 500 mg by mouth 2 (two) times daily as needed for mild pain. 03/27/22   [provider]    Physical  Exam: Vitals:   07/27/22 1500 07/27/22 1530 07/27/22 1600 07/27/22 1613  BP: 127/68 (!) 162/74 131/65   Pulse: (!) 29  93   Resp: 20 (!) 25 (!) 22   Temp:    98.5 F (36.9 C)  TempSrc:    Oral  SpO2: (!) 72%  97%   Weight:      Height:       Physical Exam Constitutional:      Appearance: She is normal weight.  HENT:     Head: Normocephalic and atraumatic.     Mouth/Throat:     Mouth: Mucous membranes are moist.  Eyes:     Pupils: Pupils are equal, round, and reactive to light.  Cardiovascular:     Rate and Rhythm: Normal rate and regular rhythm.  Pulmonary:     Effort: Pulmonary effort is normal.  Abdominal:     General: Bowel sounds are normal.  Musculoskeletal:        General: Normal range of motion.  Skin:    General: Skin is warm.  Neurological:     General: No focal deficit present.  Psychiatric:        Mood and Affect: Mood normal.     Data Reviewed:  There are no new results to review at this time.  Assessment and Plan: * UGIB (upper gastrointestinal bleed) Acute hematemesis with black stools over the past 24 hours in setting of anticoagulation use associated with acute PE Hemoglobin 7.7 today with baseline hemoglobin around 11 Will trend hemoglobin Transfuse for hemoglobin less than 7 IV PPI Per ER physician Dr. Nickolas Madrid, Dr. Vicente Males with gastroenterology aware of case Follow-up gastroenterology recommendations Keep patient n.p.o. Follow  Acute pulmonary embolism Recent admission February 2024 with workup including positive VQ scan, however CTA negative for PE Continue anticoagulation per oncology recommendations in setting of active malignancy Holding Eliquis in the setting of upper GI bleed  Essential hypertension BP stable  Titrate home regimen    Chronic kidney disease, stage 3a Creatinine 1 today Follow  HTN (hypertension) BP stable Titrate home regimen  Type II diabetes mellitus with renal manifestations SSI   Tobacco abuse Active  smoker  Discussed cessation  Pt not ready to quit    Leukocytosis WBC 13.8 on presentation  WBC looks to be fairly chronically elevated over the past 2 months with range 8-15 No overt source of infection at present  Noted L lung changes on CXR w/ noted lung cancer  Will monitor for now  Reassess if pt spikes fever  Follow   Normocytic anemia Decompensated anemia in the setting of upper GI bleeding Hemoglobin 7.7 today with baseline hemoglobin around 10-11 Transfuse for hemoglobin less than 7 Follow  Malignant neoplasm of lung Currently on immunotherapy, followed by Dr. Tasia Catchings with oncology Stage IV non-small cell cancer Follow      Advance Care Planning:   Code Status: Full Code   Consults: Gastroenterology w/  Dr. Vicente Males.   Family Communication: No family at the bedside   Greater than 50% was spent in counseling and coordination of care with patient Total encounter time 80 minutes or more   Severity of Illness: The appropriate patient status for this patient is INPATIENT. Inpatient status is judged to be reasonable and necessary in order to provide the required intensity of service to ensure the patient's safety. The patient's presenting symptoms, physical exam findings, and initial radiographic and laboratory data in the context of their chronic comorbidities is felt to place them at high risk for further clinical deterioration. Furthermore, it is not anticipated that the patient will be medically stable for discharge from the hospital within 2 midnights of admission.   * I certify that at the point of admission it is my clinical judgment that the patient will require inpatient hospital care spanning beyond 2 midnights from the point of admission due to high intensity of service, high risk for further deterioration and high frequency of surveillance required.*  Author: Deneise Lever, MD 07/27/2022 4:49 PM  For on call review [email or web address].

## 2022-07-27 NOTE — Assessment & Plan Note (Signed)
[facility]

## 2022-07-27 NOTE — Assessment & Plan Note (Signed)
Currently on immunotherapy, followed by Dr. Tasia Catchings with oncology Stage IV non-small cell cancer Follow

## 2022-07-27 NOTE — Assessment & Plan Note (Signed)
BP stable Titrate home regimen 

## 2022-07-27 NOTE — Assessment & Plan Note (Addendum)
Recent admission February 2024 with workup including positive VQ scan, however CTA negative for PE Continue anticoagulation per oncology recommendations in setting of active malignancy Holding Eliquis in the setting of upper GI bleed

## 2022-07-27 NOTE — ED Triage Notes (Signed)
Pt via POV from home. Pt c/o rectal bleeding, black tarry stool, SOB, and nausea starting today. Denies hx of blood transfusion. Denies hx of blood thinners. Pt has a hx lung cancer and actively getting chemotherapy. Last treatment was last week. Pt is A&Ox4 and NAD

## 2022-07-27 NOTE — Assessment & Plan Note (Signed)
Creatinine 1 today Follow

## 2022-07-27 NOTE — Assessment & Plan Note (Signed)
WBC 13.8 on presentation  WBC looks to be fairly chronically elevated over the past 2 months with range 8-15 No overt source of infection at present  Noted L lung changes on CXR w/ noted lung cancer  Will monitor for now  Reassess if pt spikes fever  Follow

## 2022-07-27 NOTE — ED Provider Notes (Signed)
[facility] Provider Note    Event Date/Time   First MD Initiated Contact with Patient 07/27/22 1241     (approximate)   History   Rectal Bleeding and Shortness of Breath   HPI  Regina Perkins is a 82 y.o. female with history of lung cancer with immunotherapy who comes in with concerns for rectal bleeding.  Patient reports having 2 episodes of bloody vomitus and then developed some black tarry stool.  Denies having this previously.  Denies any history of blood thinners.   Physical Exam   Triage Vital Signs: ED Triage Vitals  Enc Vitals Group     BP 07/27/22 1218 (!) 87/68     Pulse Rate 07/27/22 1218 (!) 103     Resp 07/27/22 1218 20     Temp 07/27/22 1218 98.6 F (37 C)     Temp Source 07/27/22 1218 Oral     SpO2 07/27/22 1218 98 %     Weight 07/27/22 1216 115 lb (52.2 kg)     Height 07/27/22 1216 5\' 3"  (1.6 m)     Head Circumference --      Peak Flow --      Pain Score 07/27/22 1216 0     Pain Loc --      Pain Edu? --      Excl. in [place]? --     Most recent vital signs: Vitals:   07/27/22 1218  BP: (!) 87/68  Pulse: (!) 103  Resp: 20  Temp: 98.6 F (37 C)  SpO2: 98%     General: Awake, no distress.  CV:  Good peripheral perfusion.  Resp:  Normal effort.  Abd:  No distention.  Other:  Black stool, Hemoccult positive   ED Results / Procedures / Treatments   Labs (all labs ordered are listed, but only abnormal results are displayed) Labs Reviewed  COMPREHENSIVE METABOLIC PANEL - Abnormal; Notable for the following components:      Result Value   Sodium 134 (*)    Chloride 95 (*)    Glucose, Bld 257 (*)    BUN 46 (*)    Calcium 8.7 (*)    Total Protein 6.2 (*)    Albumin 3.2 (*)    GFR, Estimated 59 (*)    All other components within normal limits  CBC - Abnormal; Notable for the following components:   WBC 13.8 (*)    RBC 2.87 (*)    Hemoglobin 7.7 (*)    HCT 24.6 (*)    RDW 17.7 (*)    All other components within  normal limits  POC OCCULT BLOOD, ED  TYPE AND SCREEN     EKG  My interpretation of EKG:  Sinus tachycardia rate 1 2 without any ST elevation or T wave inversions normal intervals  RADIOLOGY I have reviewed the xray personally and interpreted patient has opacities noted   PROCEDURES:  Critical Care performed: no  .1-3 Lead EKG Interpretation  Performed by: Vanessa [place], MD Authorized by: Vanessa [place], MD     Interpretation: normal     ECG rate:  90   ECG rate assessment: normal     Rhythm: sinus rhythm     Ectopy: none     Conduction: normal      MEDICATIONS ORDERED IN ED: Medications  pantoprazole (PROTONIX) injection 40 mg (has no administration in time range)  sodium chloride 0.9 % bolus 1,000 mL (0 mLs Intravenous Stopped 07/27/22 1502)  IMPRESSION / MDM / ASSESSMENT AND PLAN / ED COURSE  I reviewed the triage vital signs and the nursing notes.   Patient's presentation is most consistent with acute presentation with potential threat to life or bodily function.   Patient comes in with concern for GI bleed.  Melanotic stool noted on exam.  Denies any alcohol or liver issues.  Denies any significant NSAIDs.  Will give a dose of Protonix.  Nothing to suggest liver dysfunction.  Did message Dr. Vicente Males from GI.  CBC shows dropping hemoglobin to 7.7.  CMP is reassuring with normal LFTs.  Will discuss with the hospital team for admission.  I suspect shortness of breath is more likely related to the anemia.  The x-ray was concern for some potential opacities.  Will add on procalcitonin to look for infection the patient has no fever.  This could be from her known lung cancer.  The patient is on the cardiac monitor to evaluate for evidence of arrhythmia and/or significant heart rate changes.      FINAL CLINICAL IMPRESSION(S) / ED DIAGNOSES   Final diagnoses:  Gastrointestinal hemorrhage, unspecified gastrointestinal hemorrhage type     Rx / DC Orders   ED  Discharge Orders     None        Note:  This document was prepared using Dragon voice recognition software and may include unintentional dictation errors.   Vanessa Hanson, MD 07/27/22 [phone]

## 2022-07-27 NOTE — Assessment & Plan Note (Signed)
Acute hematemesis with black stools over the past 24 hours in setting of anticoagulation use associated with acute PE Hemoglobin 7.7 today with baseline hemoglobin around 11 Will trend hemoglobin Transfuse for hemoglobin less than 7 IV PPI Per ER physician Dr. Nickolas Madrid, Dr. Vicente Males with gastroenterology aware of case Follow-up gastroenterology recommendations Keep patient n.p.o. Follow

## 2022-07-28 DIAGNOSIS — I10 Essential (primary) hypertension: Secondary | ICD-10-CM

## 2022-07-28 DIAGNOSIS — N1831 Chronic kidney disease, stage 3a: Secondary | ICD-10-CM | POA: Diagnosis not present

## 2022-07-28 DIAGNOSIS — K922 Gastrointestinal hemorrhage, unspecified: Secondary | ICD-10-CM | POA: Diagnosis not present

## 2022-07-28 DIAGNOSIS — E876 Hypokalemia: Secondary | ICD-10-CM

## 2022-07-28 DIAGNOSIS — D649 Anemia, unspecified: Secondary | ICD-10-CM | POA: Diagnosis not present

## 2022-07-28 LAB — CBC
HCT: 20.1 % — ABNORMAL LOW (ref 36.0–46.0)
Hemoglobin: 6.4 g/dL — ABNORMAL LOW (ref 12.0–15.0)
MCH: 26.3 pg (ref 26.0–34.0)
MCHC: 31.8 g/dL (ref 30.0–36.0)
MCV: 82.7 fL (ref 80.0–100.0)
Platelets: 303 10*3/uL (ref 150–400)
RBC: 2.43 MIL/uL — ABNORMAL LOW (ref 3.87–5.11)
RDW: 17.8 % — ABNORMAL HIGH (ref 11.5–15.5)
WBC: 7.7 10*3/uL (ref 4.0–10.5)
nRBC: 0 % (ref 0.0–0.2)

## 2022-07-28 LAB — COMPREHENSIVE METABOLIC PANEL
ALT: 11 U/L (ref 0–44)
AST: 19 U/L (ref 15–41)
Albumin: 2.8 g/dL — ABNORMAL LOW (ref 3.5–5.0)
Alkaline Phosphatase: 42 U/L (ref 38–126)
Anion gap: 8 (ref 5–15)
BUN: 30 mg/dL — ABNORMAL HIGH (ref 8–23)
CO2: 25 mmol/L (ref 22–32)
Calcium: 8.2 mg/dL — ABNORMAL LOW (ref 8.9–10.3)
Chloride: 105 mmol/L (ref 98–111)
Creatinine, Ser: 0.83 mg/dL (ref 0.44–1.00)
GFR, Estimated: >60 mL/min (ref >60)
Glucose, Bld: 139 mg/dL — ABNORMAL HIGH (ref 70–99)
Potassium: 3.2 mmol/L — ABNORMAL LOW (ref 3.5–5.1)
Sodium: 138 mmol/L (ref 135–145)
Total Bilirubin: 0.4 mg/dL (ref 0.3–1.2)
Total Protein: 5.6 g/dL — ABNORMAL LOW (ref 6.5–8.1)

## 2022-07-28 LAB — GLUCOSE, CAPILLARY
Glucose-Capillary: 150 mg/dL — ABNORMAL HIGH (ref 70–99)
Glucose-Capillary: 167 mg/dL — ABNORMAL HIGH (ref 70–99)
Glucose-Capillary: 124 mg/dL — ABNORMAL HIGH (ref 70–99)
Glucose-Capillary: 163 mg/dL — ABNORMAL HIGH (ref 70–99)
Glucose-Capillary: 145 mg/dL — ABNORMAL HIGH (ref 70–99)

## 2022-07-28 LAB — PREPARE RBC (CROSSMATCH)

## 2022-07-28 LAB — MAGNESIUM: Magnesium: 1.6 mg/dL — ABNORMAL LOW (ref 1.7–2.4)

## 2022-07-28 MED ORDER — BOOST / RESOURCE BREEZE PO LIQD CUSTOM
1.0000 | Freq: Three times a day (TID) | ORAL | Status: DC
  Administered 2022-07-28: 1 via ORAL

## 2022-07-28 MED ORDER — MAGNESIUM SULFATE 4 GM/100ML IV SOLN
4.0000 g | Freq: Once | INTRAVENOUS | Status: AC
  Administered 2022-07-28: 4 g via INTRAVENOUS
  Filled 2022-07-28: qty 100

## 2022-07-28 MED ORDER — POTASSIUM CHLORIDE 2 MEQ/ML IV SOLN
INTRAVENOUS | Status: AC
  Filled 2022-07-28: qty 1000

## 2022-07-28 MED ORDER — ADULT MULTIVITAMIN W/MINERALS CH: 1.0000 | ORAL_TABLET | Freq: Every day | ORAL | Status: DC

## 2022-07-28 MED ORDER — LABETALOL HCL 5 MG/ML IV SOLN
10.0000 mg | INTRAVENOUS | Status: DC | PRN
  Administered 2022-07-28: 10 mg via INTRAVENOUS
  Filled 2022-07-28: qty 4

## 2022-07-28 MED ORDER — SODIUM CHLORIDE 0.9% IV SOLUTION: Freq: Once | INTRAVENOUS | Status: AC

## 2022-07-28 MED ORDER — SODIUM CHLORIDE 0.9 % IV SOLN: INTRAVENOUS | Status: DC

## 2022-07-28 NOTE — Progress Notes (Signed)
Progress Note   Patient: Regina Perkins [id number] DOB: 1940-06-14 DOA: 07/27/2022     1 DOS: the patient was seen and examined on 07/28/2022   Brief hospital course: Taken from H&P.  Nathaly L Hickling is a 82 y.o. female with medical history significant of stage IV lung cancer, stage III CKD, type 2 diabetes, hypertension, recent diagnosis of acute PE on Eliquis, tobacco abuse presenting with upper GI bleeding.  Patient reports episode of bloody vomiting earlier today as well as having stools of black consistency of the past 24 hours.  Noted to have been evaluated back in February with concern for possible PE. VQ scan was positive, however CTA was negative for PE. Patient was continued on Eliquis in setting of active malignancy. Patient denies any NSAID use.   ED course. Presented to the ER afebrile, hemodynamically stable.  Satting well on room air.  White count 13.8, hemoglobin 7.7, platelets 349.  Creatinine 0.97, glucose 257.  Chest x-ray with progressive upper lobe opacity-suspect malignancy.  4/3: Hemodynamically stable.  Hemoglobin continued to drop, 6.4 this morning.  2 units of PRBC ordered.  EGD tomorrow as patient was on Eliquis.  Will      Assessment and Plan: * UGIB (upper gastrointestinal bleed) Acute hematemesis with black stools over the past 24 hours in setting of anticoagulation use associated with acute PE-Eliquis was discontinued Hemoglobin decreased to 6 today with baseline hemoglobin around 11 2 unit of PRBC ordered GI is on board-EGD tomorrow Monitor hemoglobin Transfuse if below 7   Acute pulmonary embolism Recent admission February 2024 with workup including positive VQ scan, however CTA negative for PE Continue anticoagulation per oncology recommendations in setting of active malignancy Holding Eliquis in the setting of upper GI bleed  Essential hypertension BP stable  Titrate home regimen    Chronic kidney disease, stage 3a Creatinine 1  today Follow  HTN (hypertension) BP stable Titrate home regimen  Type II diabetes mellitus with renal manifestations SSI   Normocytic anemia Decompensated anemia in the setting of upper GI bleeding Hemoglobin 6 today with baseline hemoglobin around 10-11 2 unit of PRBC ordered Continue to monitor  Hypokalemia Hypomagnesemia. -Replete electrolytes and monitor  Tobacco abuse Active smoker  Discussed cessation  Pt not ready to quit    Leukocytosis Resolved. No source of infection.  Malignant neoplasm of lung Currently on immunotherapy, followed by Dr. Tasia Catchings with oncology Stage IV non-small cell cancer Follow   Subjective: Patient was seen and examined today.  No new complaints.  Did not had any more bowel movement.  Will  Physical Exam: Vitals:   07/28/22 0808 07/28/22 1356 07/28/22 1421 07/28/22 1548  BP: 136/75 (!) 159/68 (!) 154/68 (!) 168/73  Pulse: 96 72 91 82  Resp: 16 17 16 20   Temp: 98.3 F (36.8 C) 97.6 F (36.4 C) 97.9 F (36.6 C) 98.4 F (36.9 C)  TempSrc: Oral Oral Oral Oral  SpO2: 94% (!) 80% 100% 99%  Weight:      Height:       General.  Frail elderly lady, in no acute distress. Pulmonary.  Lungs clear bilaterally, normal respiratory effort. CV.  Regular rate and rhythm, no JVD, rub or murmur. Abdomen.  Soft, nontender, nondistended, BS positive. CNS.  Alert and oriented .  No focal neurologic deficit. Extremities.  No edema, no cyanosis, pulses intact and symmetrical. Psychiatry.  Judgment and insight appears normal.   Data Reviewed: Prior data reviewed  Family Communication: Discussed with son on phone  Disposition: Status is: Inpatient Remains inpatient appropriate because: Severity of illness  Planned Discharge Destination: Home  Time spent: 45 minutes  This record has been created using Systems analyst. Errors have been sought and corrected,but may not always be located. Such creation errors do not reflect on  the standard of care.   Author: Lorella Nimrod, MD 07/28/2022 4:43 PM  For on call review [email or web address].

## 2022-07-28 NOTE — Assessment & Plan Note (Signed)
Acute hematemesis with black stools over the past 24 hours in setting of anticoagulation use associated with acute PE-Eliquis was discontinued Hemoglobin decreased to 6 today with baseline hemoglobin around 11 2 unit of PRBC ordered GI is on board-EGD tomorrow Monitor hemoglobin Transfuse if below 7

## 2022-07-28 NOTE — Assessment & Plan Note (Signed)
Decompensated anemia in the setting of upper GI bleeding Hemoglobin 6 today with baseline hemoglobin around 10-11 2 unit of PRBC ordered Continue to monitor

## 2022-07-28 NOTE — Assessment & Plan Note (Signed)
Hypomagnesemia. -Replete electrolytes and monitor

## 2022-07-28 NOTE — Progress Notes (Signed)
Chap responded to a Midwife for [employer]. Pt indicated that she already hand appointed an Agent and that her granddaughter is coming after work to bring a copy of the same.   07/28/22 0900  Spiritual Encounters  Type of Visit Initial  Care provided to: Patient  Referral source Nurse (RN/NT/LPN)  Reason for visit Advance directives  OnCall Visit No  Spiritual Framework  Presenting Themes Courage hope and growth  Community/Connection Family  Patient Stress Factors Health changes  Interventions  Spiritual Care Interventions Made Established relationship of care and support;Decision-making support/facilitation  Intervention Outcomes  Outcomes Awareness of support  Spiritual Care Plan  Spiritual Care Issues Still Outstanding No further spiritual care needs at this time (see row info)  Advance Directives (For Healthcare)  Does Patient Have a Medical Advance Directive? No

## 2022-07-28 NOTE — Consult Note (Signed)
Jonathon Bellows , MD [address], [place], [place], [place], [address] [address], [place], [place], [place], [address] Phone: [phone]  Fax: [phone]  Consultation  Referring Provider:    DR Jari Pigg Primary Care Physician:  [facility] [place] Primary Gastroenterologist:  None          Reason for Consultation:     GI bleed  Date of Admission:  07/27/2022 Date of Consultation:  07/28/2022         HPI:   Regina Perkins is a 82 y.o. female has a history of stage IV lung cancer, CKD, diabetes recent diagnosis of acute PE on Eliquis presenting with hematemesis yesterday and melena..  Admission hemoglobin was 7.7 g 7 days back hemoglobin was 10.6 g this morning hemoglobin 6.4 grams, noted elevation in BUN/creatinine ratio of 30 BUN/creatinine 0.83   06/19/2022 CT scan of the chest without contrast showed soft tissue mass in the mediastinum, pulmonary nodules trace pleural effusion moderate pericardial effusion.[place] on admission shows progressive left upper lobe opacity   He is that he had 1 episode of bloody vomitus yesterday and a few episodes of black tarry stool none since then.  Denies any nosebleeds.  Denies any similar episodes in the past.  Denies any abdominal pain. Cannot recall when he took the last dose of Eliquis.  Denies any NSAID use.  Past Medical History:  Diagnosis Date   Chronic kidney disease, stage 3a    Diabetes mellitus without complication    HTN (hypertension)    Hypertension    Type II diabetes mellitus with renal manifestations     History reviewed. No pertinent surgical history.  Prior to Admission medications   Medication Sig Start Date End Date Taking? Authorizing Provider  albuterol (VENTOLIN HFA) 108 (90 Base) MCG/ACT inhaler Inhale 2 puffs into the lungs every 4 (four) hours as needed for wheezing or shortness of breath. 05/21/22  Yes [provider]  amLODipine (NORVASC) 10 MG tablet Take 10 mg by mouth daily.    Yes [provider]  benzonatate (TESSALON) 200 MG capsule Take 1 capsule (200 mg total) by mouth 3 (three) times daily as needed for cough. 05/23/22  Yes Alexander, Lanelle Bal, DO  BREZTRI AEROSPHERE 160-9-4.8 MCG/ACT AERO Inhale into the lungs. 06/03/22 06/03/23 Yes [provider]  fluticasone (FLONASE) 50 MCG/ACT nasal spray Place 1 spray into both nostrils daily.   Yes [provider]  Fluticasone-Umeclidin-Vilant (TRELEGY ELLIPTA) 100-62.5-25 MCG/ACT AEPB Inhale into the lungs. 06/03/22  Yes [provider]  furosemide (LASIX) 40 MG tablet Take 1 tablet (40 mg total) by mouth daily. 05/27/22 07/27/22 Yes Richarda Osmond, MD  lisinopril (ZESTRIL) 40 MG tablet Take 40 mg by mouth daily.   Yes [provider]  metFORMIN (GLUCOPHAGE) 1000 MG tablet Take 1,000 mg by mouth 2 (two) times daily with a meal.   Yes [provider]  prochlorperazine (COMPAZINE) 10 MG tablet Take 1 tablet (10 mg total) by mouth every 6 (six) hours as needed for nausea or vomiting. 07/08/22  Yes Earlie Server, MD  naproxen (NAPROSYN) 500 MG tablet Take 500 mg by mouth 2 (two) times daily as needed for mild pain. 03/27/22   [provider]    Family History  Problem Relation Age of Onset   Asthma Mother    Heart disease Mother    Hypertension Mother    Diabetes Mother    Stroke Mother    Diabetes Sister  Diabetes Brother      Social History   Tobacco Use   Smoking status: Every Day    Packs/day: .5    Types: Cigarettes   Smokeless tobacco: Never  Substance Use Topics   Alcohol use: No   Drug use: No    Allergies as of 07/27/2022 - Review Complete 07/27/2022  Allergen Reaction Noted   Ivp dye [iodinated contrast media]  06/08/2015   Shellfish allergy Other (See Comments) 12/30/2015    Review of Systems:    All systems reviewed and negative except where noted in HPI.   Physical Exam:  Vital signs in last 24 hours: Temp:  [97.8 F (36.6 C)-99 F  (37.2 C)] 98.3 F (36.8 C) (04/03 0808) Pulse Rate:  [77-103] 96 (04/03 0808) Resp:  [16-25] 16 (04/03 0808) BP: (87-162)/(64-96) 136/75 (04/03 0808) SpO2:  [94 %-100 %] 94 % (04/03 0808) Weight:  [52.2 kg] 52.2 kg (04/02 1216) Last BM Date : 07/27/22 General:   Pleasant, cooperative in NAD Head:  Normocephalic and atraumatic. Eyes:   No icterus.   Conjunctiva pink. PERRLA. Ears:  Normal auditory acuity. Neck:  Supple; no masses or thyroidomegaly Lungs: Respirations even and unlabored. Lungs clear to auscultation bilaterally.   No wheezes, crackles, or rhonchi.  Heart:  Regular rate and rhythm;  Without murmur, clicks, rubs or gallops Abdomen:  Soft, nondistended, nontender. Normal bowel sounds. No appreciable masses or hepatomegaly.  No rebound or guarding.  Neurologic:  Alert and oriented x3;  grossly normal neurologically. Skin:  Intact without significant lesions or rashes. Cervical Nodes:  No significant cervical adenopathy. Psych:  Alert and cooperative. Normal affect.  LAB RESULTS: Recent Labs    07/27/22 1228 07/27/22 1744 07/27/22 2246 07/28/22 0404  WBC 13.8*  --   --  7.7  HGB 7.7* 7.0* 6.0* 6.4*  HCT 24.6* 21.4* 18.6* 20.1*  PLT 349  --   --  303   BMET Recent Labs    07/27/22 1228 07/28/22 0404  NA 134* 138  K 3.8 3.2*  CL 95* 105  CO2 27 25  GLUCOSE 257* 139*  BUN 46* 30*  CREATININE 0.97 0.83  CALCIUM 8.7* 8.2*   LFT Recent Labs    07/28/22 0404  PROT 5.6*  ALBUMIN 2.8*  AST 19  ALT 11  ALKPHOS 42  BILITOT 0.4   PT/INR No results for input(s): "LABPROT", "INR" in the last 72 hours.  STUDIES: DG Chest Portable 1 View  Result Date: 07/27/2022 CLINICAL DATA:  sob EXAM: PORTABLE CHEST - 1 VIEW COMPARISON:  05/24/2022 FINDINGS: Progressive left upper lobe opacity obscuring the aortic arch. Improving consolidation/atelectasis at the left lung base with infrahilar residual. Right lung clear. Heart size within normal limits. Mild elevation of the  left diaphragmatic leaflet. Vertebral endplate spurring at multiple levels in the lower thoracic spine. IMPRESSION: 1. Progressive left upper lobe opacity. 2. Improving left lower lobe consolidation/atelectasis. Electronically Signed   By: Lucrezia Europe M.D.   On: 07/27/2022 14:18      Impression / Plan:   WESTLYNN VOWELS is a 82 y.o. y/o female with a history of stage IV lung cancer, CKD on Eliquis for pulmonary embolism presents with hematemesis and melena of 1 day duration increasing BUN/creatinine ratio and drop in hemoglobin likely an upper GI bleed.  Plan 1.  Monitor CBC and transfuse as needed 2.  IV PPI 3.  Eliquis has been held once off Eliquis for 2 days with plan for an upper endoscopy,  Eliquis has been held yesterday and will be held today we can plan for an EGD tomorrow  I have discussed alternative options, risks & benefits,  which include, but are not limited to, bleeding, infection, perforation,respiratory complication & drug reaction.  The patient agrees with this plan & written consent will be obtained.     Thank you for involving me in the care of this patient.      LOS: 1 day   Jonathon Bellows, MD  07/28/2022, 8:55 AM

## 2022-07-28 NOTE — Progress Notes (Signed)
Initial Nutrition Assessment  DOCUMENTATION CODES:   Non-severe (moderate) malnutrition in context of chronic illness  INTERVENTION:   -Boost Breeze po TID, each supplement provides 250 kcal and 9 grams of protein  -MVI with minerals daily -RD will follow for diet advancement and add supplements as appropriate  NUTRITION DIAGNOSIS:   Moderate Malnutrition related to chronic illness (stage IV lung cancer) as evidenced by mild fat depletion, moderate fat depletion, mild muscle depletion, moderate muscle depletion.  GOAL:   Patient will meet greater than or equal to 90% of their needs  MONITOR:   PO intake, Supplement acceptance, Diet advancement  REASON FOR ASSESSMENT:   Malnutrition Screening Tool    ASSESSMENT:   Pt with medical history significant of stage IV lung cancer, stage III CKD, type 2 diabetes, hypertension, recent diagnosis of acute PE on Eliquis, tobacco abuse presenting with upper GI bleeding.  Pt admitted with upper GIB.   Reviewed I/O's: + 1 L x 24 hours  Per GI notes, plan for EGD now.   Pt lying in bed at time of visit, pleasant and in good spirits today. Noted she was receiving a blood transfusion. Pt reports decreased oral intake over the past few months. Pt shares she does not eat much at baseline, but often grazes throughout the day. Pt reports she eats two half sandwiches, as well as peanut butter crackers. She started drinking 2 Ensure Complete supplements daily as recommended by Nile RD. Pt has also been sipping in clear liquids without difficulty.   Pt has no teeth, but has access to dentures "in the closet- just for show". Pt denies any difficulty xhewing or swallowing foods.   Per pt, her UBW is around 149# and has decreased down to 116# over the past few months. Pt shares she was "losing 4 pounds a week for a long time". She thinks that she gained 1# last week, but suspects she may has lost it secondary to acute illness.  Reviewed wt hx;  pt has experienced a 3.2% wt loss over the past month, which is not significant for time frame.   Discussed importance of good meal and supplement intake to promote healing. Pt amenable to supplements. Encouraged continued use of Ensure at home.   Medications reviewed and include lactated ringers with potassium chloride 100 ml/hr.   Lab Results  Component Value Date   HGBA1C 7.1 (H) 05/22/2022   PTA DM medications are 100 mg metformin BID.   Labs reviewed: K: 3.2, Mg: 1.6, CBGS: 124-163 (inpatient orders for glycemic control are 0-9 units insulin aspart every 4 hours).    NUTRITION - FOCUSED PHYSICAL EXAM:  Flowsheet Row Most Recent Value  Orbital Region Mild depletion  Upper Arm Region Moderate depletion  Thoracic and Lumbar Region Mild depletion  Buccal Region Mild depletion  Temple Region Moderate depletion  Clavicle Bone Region Moderate depletion  Clavicle and Acromion Bone Region Moderate depletion  Scapular Bone Region Moderate depletion  Dorsal Hand Moderate depletion  Patellar Region Moderate depletion  Anterior Thigh Region Moderate depletion  Posterior Calf Region Moderate depletion  Edema (RD Assessment) None  Hair Reviewed  Eyes Reviewed  Mouth Reviewed  Skin Reviewed  Nails Reviewed       Diet Order:   Diet Order             Diet clear liquid Room service appropriate? Yes; Fluid consistency: Thin  Diet effective now  EDUCATION NEEDS:   Education needs have been addressed  Skin:  Skin Assessment: Reviewed RN Assessment  Last BM:  07/27/22  Height:   Ht Readings from Last 1 Encounters:  07/27/22 5\' 3"  (1.6 m)    Weight:   Wt Readings from Last 1 Encounters:  07/27/22 52.2 kg    Ideal Body Weight:  52.3 kg  BMI:  Body mass index is 20.37 kg/m.  Estimated Nutritional Needs:   Kcal:  1650-1850  Protein:  80-95 grams  Fluid:  > 1.6 L    Loistine Chance, RD, LDN, [place] Registered Dietitian II Certified Diabetes  Care and Education Specialist Please refer to [facility] for RD and/or RD on-call/weekend/after hours pager

## 2022-07-28 NOTE — Hospital Course (Addendum)
Taken from H&P.  Regina Perkins is a 82 y.o. female with medical history significant of stage IV lung cancer, stage III CKD, type 2 diabetes, hypertension, recent diagnosis of acute PE on Eliquis, tobacco abuse presenting with upper GI bleeding.  Patient reports episode of bloody vomiting earlier today as well as having stools of black consistency of the past 24 hours.  Noted to have been evaluated back in February with concern for possible PE. VQ scan was positive, however CTA was negative for PE. Patient was continued on Eliquis in setting of active malignancy. Patient denies any NSAID use.   ED course. Presented to the ER afebrile, hemodynamically stable.  Satting well on room air.  White count 13.8, hemoglobin 7.7, platelets 349.  Creatinine 0.97, glucose 257.  Chest x-ray with progressive upper lobe opacity-suspect malignancy.  4/3: Hemodynamically stable.  Hemoglobin continued to drop, 6.4 this morning.  2 units of PRBC ordered.  EGD tomorrow as patient was on Eliquis.  Will

## 2022-07-29 ENCOUNTER — Encounter
Admission: EM | Disposition: A | Discharge status: Home / Self Care | Payer: Self-pay | Source: Home / Self Care | Attending: Internal Medicine

## 2022-07-29 ENCOUNTER — Encounter: Payer: Self-pay | Admitting: Family Medicine

## 2022-07-29 ENCOUNTER — Inpatient Hospital Stay: Payer: Medicare Other | Admitting: Anesthesiology

## 2022-07-29 DIAGNOSIS — N1831 Chronic kidney disease, stage 3a: Secondary | ICD-10-CM | POA: Diagnosis not present

## 2022-07-29 DIAGNOSIS — K264 Chronic or unspecified duodenal ulcer with hemorrhage: Secondary | ICD-10-CM

## 2022-07-29 DIAGNOSIS — C3482 Malignant neoplasm of overlapping sites of left bronchus and lung: Secondary | ICD-10-CM

## 2022-07-29 DIAGNOSIS — K922 Gastrointestinal hemorrhage, unspecified: Secondary | ICD-10-CM | POA: Diagnosis not present

## 2022-07-29 DIAGNOSIS — E1129 Type 2 diabetes mellitus with other diabetic kidney complication: Secondary | ICD-10-CM

## 2022-07-29 DIAGNOSIS — I10 Essential (primary) hypertension: Secondary | ICD-10-CM | POA: Diagnosis not present

## 2022-07-29 DIAGNOSIS — Z720 Tobacco use: Secondary | ICD-10-CM

## 2022-07-29 DIAGNOSIS — E440 Moderate protein-calorie malnutrition: Secondary | ICD-10-CM | POA: Insufficient documentation

## 2022-07-29 HISTORY — PX: ESOPHAGOGASTRODUODENOSCOPY (EGD) WITH PROPOFOL: SHX5813

## 2022-07-29 LAB — GLUCOSE, CAPILLARY
Glucose-Capillary: 90 mg/dL (ref 70–99)
Glucose-Capillary: 94 mg/dL (ref 70–99)
Glucose-Capillary: 130 mg/dL — ABNORMAL HIGH (ref 70–99)
Glucose-Capillary: 124 mg/dL — ABNORMAL HIGH (ref 70–99)

## 2022-07-29 LAB — BASIC METABOLIC PANEL
Anion gap: 9 (ref 5–15)
BUN: 15 mg/dL (ref 8–23)
CO2: 26 mmol/L (ref 22–32)
Calcium: 8.3 mg/dL — ABNORMAL LOW (ref 8.9–10.3)
Chloride: 103 mmol/L (ref 98–111)
Creatinine, Ser: 0.84 mg/dL (ref 0.44–1.00)
GFR, Estimated: >60 mL/min (ref >60)
Glucose, Bld: 123 mg/dL — ABNORMAL HIGH (ref 70–99)
Potassium: 3 mmol/L — ABNORMAL LOW (ref 3.5–5.1)
Sodium: 138 mmol/L (ref 135–145)

## 2022-07-29 LAB — CBC
HCT: 29.7 % — ABNORMAL LOW (ref 36.0–46.0)
Hemoglobin: 9.6 g/dL — ABNORMAL LOW (ref 12.0–15.0)
MCH: 25.6 pg — ABNORMAL LOW (ref 26.0–34.0)
MCHC: 32.3 g/dL (ref 30.0–36.0)
MCV: 79.2 fL — ABNORMAL LOW (ref 80.0–100.0)
Platelets: 296 10*3/uL (ref 150–400)
RBC: 3.75 MIL/uL — ABNORMAL LOW (ref 3.87–5.11)
RDW: 19.1 % — ABNORMAL HIGH (ref 11.5–15.5)
WBC: 10.2 10*3/uL (ref 4.0–10.5)
nRBC: 0 % (ref 0.0–0.2)

## 2022-07-29 LAB — MAGNESIUM: Magnesium: 2.3 mg/dL (ref 1.7–2.4)

## 2022-07-29 SURGERY — ESOPHAGOGASTRODUODENOSCOPY (EGD) WITH PROPOFOL
Anesthesia: General

## 2022-07-29 MED ORDER — POTASSIUM CHLORIDE CRYS ER 20 MEQ PO TBCR
40.0000 meq | EXTENDED_RELEASE_TABLET | ORAL | Status: DC
  Administered 2022-07-29: 40 meq via ORAL
  Filled 2022-07-29: qty 2

## 2022-07-29 MED ORDER — PROPOFOL 10 MG/ML IV BOLUS
INTRAVENOUS | Status: DC | PRN
  Administered 2022-07-29 (×2): 30 mg via INTRAVENOUS

## 2022-07-29 MED ORDER — BUDESON-GLYCOPYRROL-FORMOTEROL 160-9-4.8 MCG/ACT IN AERO: 1.0000 | INHALATION_SPRAY | Freq: Every day | RESPIRATORY_TRACT | Status: DC

## 2022-07-29 MED ORDER — LIDOCAINE HCL (PF) 2 % IJ SOLN
INTRAMUSCULAR | Status: DC | PRN
  Administered 2022-07-29: 30 mg via INTRADERMAL

## 2022-07-29 MED ORDER — OMEPRAZOLE 40 MG PO CPDR: 40.0000 mg | DELAYED_RELEASE_CAPSULE | Freq: Two times a day (BID) | ORAL | 2 refills | Status: DC

## 2022-07-29 MED ORDER — UMECLIDINIUM BROMIDE 62.5 MCG/ACT IN AEPB
1.0000 | INHALATION_SPRAY | Freq: Every day | RESPIRATORY_TRACT | Status: DC
  Administered 2022-07-29: 1 via RESPIRATORY_TRACT
  Filled 2022-07-29: qty 7

## 2022-07-29 MED ORDER — AMLODIPINE BESYLATE 10 MG PO TABS
10.0000 mg | ORAL_TABLET | Freq: Every day | ORAL | Status: DC
  Administered 2022-07-29: 10 mg via ORAL
  Filled 2022-07-29: qty 1

## 2022-07-29 MED ORDER — FLUTICASONE FUROATE-VILANTEROL 100-25 MCG/ACT IN AEPB
1.0000 | INHALATION_SPRAY | Freq: Every day | RESPIRATORY_TRACT | Status: DC
  Administered 2022-07-29: 1 via RESPIRATORY_TRACT
  Filled 2022-07-29: qty 28

## 2022-07-29 NOTE — Discharge Summary (Signed)
Physician Discharge Summary   Patient: Regina Perkins MRN: [id number] DOB: July 01, 1940  Admit date:     07/27/2022  Discharge date: 07/29/22  Discharge Physician: Lorella Nimrod   PCP: [facility] [place]   Recommendations at discharge:  Please obtain CBC and BMP in 1 week Follow-up with primary care provider Follow-up with gastroenterology in 1 month  Discharge Diagnoses: Principal Problem:   Gastrointestinal hemorrhage Active Problems:   Essential hypertension   Acute pulmonary embolism   Chronic kidney disease, stage 3a   HTN (hypertension)   Type II diabetes mellitus with renal manifestations   Hypokalemia   Normocytic anemia   Tobacco abuse   Malignant neoplasm of lung   Leukocytosis   Malnutrition of moderate degree   Hospital Course: Taken from H&P.  Regina Perkins is a 82 y.o. female with medical history significant of stage IV lung cancer, stage III CKD, type 2 diabetes, hypertension, recent diagnosis of acute PE on Eliquis, tobacco abuse presenting with upper GI bleeding.  Patient reports episode of bloody vomiting earlier today as well as having stools of black consistency of the past 24 hours.  Noted to have been evaluated back in February with concern for possible PE. VQ scan was positive, however CTA was negative for PE. Patient was continued on Eliquis in setting of active malignancy. Patient denies any NSAID use.   ED course. Presented to the ER afebrile, hemodynamically stable.  Satting well on room air.  White count 13.8, hemoglobin 7.7, platelets 349.  Creatinine 0.97, glucose 257.  Chest x-ray with progressive upper lobe opacity-suspect malignancy.  4/3: Hemodynamically stable.  Hemoglobin continued to drop, 6.4 this morning.  2 units of PRBC ordered.  EGD tomorrow as patient was on Eliquis.    4/4: Blood pressure mildly elevated.  Restarting home amlodipine.  Hypomagnesemia resolved, potassium still little low at 3 which is being repleted.   Hemoglobin improved to 9.6 EGD with a large nonbleeding duodenal ulcer.  GI is recommending Prilosec 40 mg twice daily for 2-month and she will need a repeat EGD in 96-month.  Per pharmacy investigation she was not taking any Eliquis as it was not found in her med rec and no recent refills.  Patient will continue on current medications and need to have a close follow-up with her providers for further recommendations.      Assessment and Plan: * Gastrointestinal hemorrhage Presented with black tarry stool, hemoglobin nadir at 6, received 2 unit of PRBC with improvement of hemoglobin to 9.6 on discharge. Per patient she was on Eliquis but apparently no record found-she should not be on any anticoagulation at this time. EGD with a large nonbleeding duodenal ulcer, GI suggested Prilosec 40 mg twice daily for 3 months and she will need a repeat EGD in 2 months.  Acute pulmonary embolism Recent admission February 2024 with workup including positive VQ scan, however CTA negative for PE Continue anticoagulation per oncology recommendations in setting of active malignancy No need of anticoagulation at this time  Essential hypertension BP stable  Titrate home regimen   Chronic kidney disease, stage 3a Creatinine 1 today Follow  HTN (hypertension) BP stable Titrate home regimen  Type II diabetes mellitus with renal manifestations SSI   Normocytic anemia Decompensated anemia in the setting of upper GI bleeding Hemoglobin 6 today with baseline hemoglobin around 10-11 S/p 2 unit of PRBC with improvement of hemoglobin to 9.6  Hypokalemia Hypomagnesemia. -Replete electrolytes and monitor  Tobacco abuse Active smoker  Discussed cessation  Pt not ready to quit    Leukocytosis Resolved. No source of infection.  Malignant neoplasm of lung Currently on immunotherapy, followed by Dr. Tasia Catchings with oncology Stage IV non-small cell cancer Follow      Consultants:  Gastroenterology Procedures performed: EGD Disposition: Home Diet recommendation:  Discharge Diet Orders (From admission, onward)     Start     Ordered   07/29/22 0000  Diet - low sodium heart healthy        07/29/22 1250           Cardiac diet DISCHARGE MEDICATION: Allergies as of 07/29/2022       Reactions   Ivp Dye [iodinated Contrast Media]    Shellfish Allergy Other (See Comments)   Reaction: unknown         Medication List     STOP taking these medications    naproxen 500 MG tablet Commonly known as: NAPROSYN       TAKE these medications    albuterol 108 (90 Base) MCG/ACT inhaler Commonly known as: VENTOLIN HFA Inhale 2 puffs into the lungs every 4 (four) hours as needed for wheezing or shortness of breath.   amLODipine 10 MG tablet Commonly known as: NORVASC Take 10 mg by mouth daily.   benzonatate 200 MG capsule Commonly known as: TESSALON Take 1 capsule (200 mg total) by mouth 3 (three) times daily as needed for cough.   Breztri Aerosphere 160-9-4.8 MCG/ACT Aero Generic drug: Budeson-Glycopyrrol-Formoterol Inhale into the lungs.   fluticasone 50 MCG/ACT nasal spray Commonly known as: FLONASE Place 1 spray into both nostrils daily.   furosemide 40 MG tablet Commonly known as: Lasix Take 1 tablet (40 mg total) by mouth daily.   lisinopril 40 MG tablet Commonly known as: ZESTRIL Take 40 mg by mouth daily.   metFORMIN 1000 MG tablet Commonly known as: GLUCOPHAGE Take 1,000 mg by mouth 2 (two) times daily with a meal.   omeprazole 40 MG capsule Commonly known as: PRILOSEC Take 1 capsule (40 mg total) by mouth in the morning and at bedtime.   prochlorperazine 10 MG tablet Commonly known as: COMPAZINE Take 1 tablet (10 mg total) by mouth every 6 (six) hours as needed for nausea or vomiting.   Trelegy Ellipta 100-62.5-25 MCG/ACT Aepb Generic drug: Fluticasone-Umeclidin-Vilant Inhale into the lungs.        Follow-up [place], [facility]. Schedule an appointment as soon as possible for a visit in 1 week(s).   Specialty: General Practice Contact information: [place] [address] [phone]         Jonathon Bellows, MD. Schedule an appointment as soon as possible for a visit.   Specialty: Gastroenterology Contact information: [place] [address] [phone]                Discharge Exam: Danley Danker Weights   07/27/22 1216  Weight: 52.2 kg   General.  Frail elderly lady, in no acute distress. Pulmonary.  Lungs clear bilaterally, normal respiratory effort. CV.  Regular rate and rhythm, no JVD, rub or murmur. Abdomen.  Soft, nontender, nondistended, BS positive. CNS.  Alert and oriented .  No focal neurologic deficit. Extremities.  No edema, no cyanosis, pulses intact and symmetrical. Psychiatry.  Judgment and insight appears normal.   Condition at discharge: stable  The results of significant diagnostics from this hospitalization (including imaging, microbiology, ancillary and laboratory) are listed  below for reference.   Imaging Studies: DG Chest Portable 1 View  Result Date: 07/27/2022 CLINICAL DATA:  sob EXAM: PORTABLE CHEST - 1 VIEW COMPARISON:  05/24/2022 FINDINGS: Progressive left upper lobe opacity obscuring the aortic arch. Improving consolidation/atelectasis at the left lung base with infrahilar residual. Right lung clear. Heart size within normal limits. Mild elevation of the left diaphragmatic leaflet. Vertebral endplate spurring at multiple levels in the lower thoracic spine. IMPRESSION: 1. Progressive left upper lobe opacity. 2. Improving left lower lobe consolidation/atelectasis. Electronically Signed   By: Lucrezia Europe M.D.   On: 07/27/2022 14:18   MR Brain W Wo Contrast  Result Date: 07/11/2022 CLINICAL DATA:  Right frontal headache.  Lung carcinoma. EXAM: MRI HEAD WITHOUT AND WITH CONTRAST  TECHNIQUE: Multiplanar, multiecho pulse sequences of the brain and surrounding structures were obtained without and with intravenous contrast. CONTRAST:  5mL GADAVIST GADOBUTROL 1 MMOL/ML IV SOLN COMPARISON:  None Available. FINDINGS: Brain: Small focus of chronic hemorrhage in the left occipital lobe. There is a linear focus of contrast enhancement also at the site (series 15, image 24 and series 1001, image 60). No other parenchymal contrast enhancing focus. No acute infarct. There is multifocal hyperintense T2-weighted signal within the white matter. Generalized volume loss. 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 The midline structures are normal. Vascular: Normal flow voids. Skull and upper cervical spine: Normal marrow signal. Sinuses/Orbits: Negative. Other: None. IMPRESSION: 1. Left occipital focus of prior hemorrhage with mild linear contrast enhancement. This may indicate a small metastasis or sequela of a nonspecific microhemmorhagic event. 2. No other parenchymal contrast enhancing focus. 3. Chronic microvascular ischemia and volume loss. 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 Electronically Signed   By: Ulyses Jarred M.D.   On: 07/11/2022 02:20    Microbiology: Results for orders placed or performed in visit on 07/09/22  Urine culture     Status: Abnormal   Collection Time: 07/09/22  2:56 PM   Specimen: Urine, Clean Catch  Result Value Ref Range Status   Specimen Description   Final    URINE, CLEAN CATCH Performed at [facility], [address] [place], [place] [address]    Special Requests   Final    NONE Performed at [facility], [address] [place], [place] [address]    Culture >=100,000 COLONIES/mL ESCHERICHIA COLI (A)  Final   Report Status 07/11/2022 FINAL  Final   Organism ID, Bacteria ESCHERICHIA COLI (A)  Final      Susceptibility   Escherichia coli - MIC*    AMPICILLIN 4 SENSITIVE  Sensitive     CEFAZOLIN <=4 SENSITIVE Sensitive     CEFEPIME <=0.12 SENSITIVE Sensitive     CEFTRIAXONE <=0.25 SENSITIVE Sensitive     CIPROFLOXACIN <=0.25 SENSITIVE Sensitive     GENTAMICIN <=1 SENSITIVE Sensitive     IMIPENEM <=0.25 SENSITIVE Sensitive     NITROFURANTOIN 32 SENSITIVE Sensitive     TRIMETH/SULFA <=20 SENSITIVE Sensitive     AMPICILLIN/SULBACTAM 4 SENSITIVE Sensitive     PIP/TAZO <=4 SENSITIVE Sensitive     * >=100,000 COLONIES/mL ESCHERICHIA COLI    Labs: CBC: Recent Labs  Lab 07/27/22 1228 07/27/22 1744 07/27/22 2246 07/28/22 0404 07/29/22 0736  WBC 13.8*  --   --  7.7 10.2  HGB 7.7* 7.0* 6.0* 6.4* 9.6*  HCT 24.6* 21.4* 18.6* 20.1* 29.7*  MCV 85.7  --   --  82.7 79.2*  PLT 349  --   --  303 0000000   Basic Metabolic Panel: Recent Labs  Lab 07/27/22 1228 07/28/22 0404 07/29/22 0736  NA 134* 138 138  K 3.8 3.2* 3.0*  CL 95* 105 103  CO2 27 25 26   GLUCOSE 257* 139* 123*  BUN 46* 30* 15  CREATININE 0.97 0.83 0.84  CALCIUM 8.7* 8.2* 8.3*  MG  --  1.6* 2.3   Liver Function Tests: Recent Labs  Lab 07/27/22 1228 07/28/22 0404  AST 20 19  ALT 13 11  ALKPHOS 51 42  BILITOT 0.4 0.4  PROT 6.2* 5.6*  ALBUMIN 3.2* 2.8*   CBG: Recent Labs  Lab 07/28/22 1945 07/29/22 0109 07/29/22 0434 07/29/22 0811 07/29/22 1126  GLUCAP 145* 90 94 130* 124*    Discharge time spent: greater than 30 minutes.  This record has been created using Systems analyst. Errors have been sought and corrected,but may not always be located. Such creation errors do not reflect on the standard of care.   Signed: Lorella Nimrod, MD [facility] 07/29/2022

## 2022-07-29 NOTE — H&P (Signed)
Regina Bellows, MD [address], [place], [place], [place], [address] [address] [place], [place], [place], [address] Phone: [phone]  Fax: [phone]  Primary Care Physician:  [facility] [place]   Pre-Procedure History & Physical: HPI:  Regina Perkins is a 82 y.o. female is here for an endoscopy    Past Medical History:  Diagnosis Date   Chronic kidney disease, stage 3a    Diabetes mellitus without complication    HTN (hypertension)    Hypertension    Type II diabetes mellitus with renal manifestations     Past Surgical History:  Procedure Laterality Date   [place] and 1998    Prior to Admission medications   Medication Sig Start Date End Date Taking? Authorizing Provider  albuterol (VENTOLIN HFA) 108 (90 Base) MCG/ACT inhaler Inhale 2 puffs into the lungs every 4 (four) hours as needed for wheezing or shortness of breath. 05/21/22  Yes [provider]  amLODipine (NORVASC) 10 MG tablet Take 10 mg by mouth daily.   Yes [provider]  benzonatate (TESSALON) 200 MG capsule Take 1 capsule (200 mg total) by mouth 3 (three) times daily as needed for cough. 05/23/22  Yes Alexander, Lanelle Bal, DO  BREZTRI AEROSPHERE 160-9-4.8 MCG/ACT AERO Inhale into the lungs. 06/03/22 06/03/23 Yes [provider]  fluticasone (FLONASE) 50 MCG/ACT nasal spray Place 1 spray into both nostrils daily.   Yes [provider]  Fluticasone-Umeclidin-Vilant (TRELEGY ELLIPTA) 100-62.5-25 MCG/ACT AEPB Inhale into the lungs. 06/03/22  Yes [provider]  furosemide (LASIX) 40 MG tablet Take 1 tablet (40 mg total) by mouth daily. 05/27/22 07/27/22 Yes Richarda Osmond, MD  lisinopril (ZESTRIL) 40 MG tablet Take 40 mg by mouth daily.   Yes [provider]  metFORMIN (GLUCOPHAGE) 1000 MG tablet Take 1,000 mg by mouth 2 (two) times daily with a meal.   Yes [provider]   prochlorperazine (COMPAZINE) 10 MG tablet Take 1 tablet (10 mg total) by mouth every 6 (six) hours as needed for nausea or vomiting. 07/08/22  Yes Earlie Server, MD  naproxen (NAPROSYN) 500 MG tablet Take 500 mg by mouth 2 (two) times daily as needed for mild pain. 03/27/22   [provider]    Allergies as of 07/27/2022 - Review Complete 07/27/2022  Allergen Reaction Noted   Ivp dye [iodinated contrast media]  06/08/2015   Shellfish allergy Other (See Comments) 12/30/2015    Family History  Problem Relation Age of Onset   Asthma Mother    Heart disease Mother    Hypertension Mother    Diabetes Mother    Stroke Mother    Diabetes Sister    Diabetes Brother     Social History   Socioeconomic History   Marital status: Widowed    Spouse name: Not on file   Number of children: Not on file   Years of education: Not on file   Highest education level: Not on file  Occupational History   Not on file  Tobacco Use   Smoking status: Every Day    Packs/day: .5    Types: Cigarettes   Smokeless tobacco: Never  Vaping Use   Vaping Use: Never used  Substance and Sexual Activity   Alcohol use: No   Drug use: No   Sexual activity: Not on file  Other Topics Concern   Not on file  Social History  Narrative   Not on file   Social Determinants of Health   Financial Resource Strain: Low Risk  (06/28/2022)   Overall Financial Resource Strain (CARDIA)    Difficulty of Paying Living Expenses: Not very hard  Food Insecurity: Food Insecurity Present (06/28/2022)   Hunger Vital Sign    Worried About Running Out of Food in the Last Year: Often true    Ran Out of Food in the Last Year: Often true  Transportation Needs: Unmet Transportation Needs (07/13/2022)   PRAPARE - Hydrologist (Medical): Yes    Lack of Transportation (Non-Medical): Yes  Physical Activity: Not on file  Stress: No Stress Concern Present (06/28/2022)   [place]    Feeling of Stress : Not at all  Social Connections: Not on file  Intimate Partner Violence: Not At Risk (06/28/2022)   Humiliation, Afraid, Rape, and Kick questionnaire    Fear of Current or Ex-Partner: No    Emotionally Abused: No    Physically Abused: No    Sexually Abused: No    Review of Systems: See HPI, otherwise negative ROS  Physical Exam: BP (!) 153/68 (BP Location: Left Arm)   Pulse 90   Temp 98.1 F (36.7 C)   Resp 16   Ht 5\' 3"  (1.6 m)   Wt 52.2 kg   SpO2 95%   BMI 20.37 kg/m  General:   Alert,  pleasant and cooperative in NAD Head:  Normocephalic and atraumatic. Neck:  Supple; no masses or thyromegaly. Lungs:  Clear throughout to auscultation, normal respiratory effort.    Heart:  +S1, +S2, Regular rate and rhythm, No edema. Abdomen:  Soft, nontender and nondistended. Normal bowel sounds, without guarding, and without rebound.   Neurologic:  Alert and  oriented x4;  grossly normal neurologically.  Impression/Plan: Regina Perkins is here for an endoscopy  to be performed for  evaluation of gi bleed    Risks, benefits, limitations, and alternatives regarding endoscopy have been reviewed with the patient.  Questions have been answered.  All parties agreeable.   Regina Bellows, MD  07/29/2022, 9:41 AM

## 2022-07-29 NOTE — Transfer of Care (Signed)
Immediate Anesthesia Transfer of Care Note  Patient: Regina Perkins  Procedure(s) Performed: ESOPHAGOGASTRODUODENOSCOPY (EGD) WITH PROPOFOL  Patient Location: PACU and Endoscopy Unit  Anesthesia Type:MAC  Level of Consciousness: drowsy  Airway & Oxygen Therapy: Patient Spontanous Breathing and Patient connected to face mask oxygen  Post-op Assessment: Report given to RN and Post -op Vital signs reviewed and stable  Post vital signs: Reviewed and stable  Last Vitals:  Vitals Value Taken Time  BP 150/71 07/29/22 1009  Temp 36.2 C 07/29/22 1009  Pulse 89 07/29/22 1011  Resp 22 07/29/22 1011  SpO2 100 % 07/29/22 1011  Vitals shown include unvalidated device data.  Last Pain:  Vitals:   07/29/22 1009  TempSrc: Temporal  PainSc: Asleep         Complications: No notable events documented.

## 2022-07-29 NOTE — Anesthesia Postprocedure Evaluation (Signed)
Anesthesia Post Note  Patient: Regina Perkins  Procedure(s) Performed: ESOPHAGOGASTRODUODENOSCOPY (EGD) WITH PROPOFOL  Patient location during evaluation: Endoscopy Anesthesia Type: General Level of consciousness: awake and alert Pain management: pain level controlled Vital Signs Assessment: post-procedure vital signs reviewed and stable Respiratory status: spontaneous breathing, nonlabored ventilation, respiratory function stable and patient connected to nasal cannula oxygen Cardiovascular status: blood pressure returned to baseline and stable Postop Assessment: no apparent nausea or vomiting Anesthetic complications: no   No notable events documented.   Last Vitals:  Vitals:   07/29/22 1019 07/29/22 1029  BP: (!) 140/71 (!) 167/78  Pulse: 89 93  Resp: (!) 27 (!) 23  Temp:    SpO2: 99% 97%    Last Pain:  Vitals:   07/29/22 1029  TempSrc:   PainSc: 0-No pain                 Precious Haws Helina Hullum

## 2022-07-29 NOTE — Anesthesia Preprocedure Evaluation (Signed)
Anesthesia Evaluation  Patient identified by MRN, date of birth, ID band Patient awake    Reviewed: Allergy & Precautions, NPO status , Patient's Chart, lab work & pertinent test results  History of Anesthesia Complications Negative for: history of anesthetic complications  Airway Mallampati: III  TM Distance: >3 FB Neck ROM: full    Dental  (+) Missing   Pulmonary shortness of breath and with exertion, COPD, Current Smoker and Patient abstained from smoking.   Pulmonary exam normal        Cardiovascular Exercise Tolerance: Good hypertension, (-) angina + Peripheral Vascular Disease  Normal cardiovascular exam     Neuro/Psych negative neurological ROS  negative psych ROS   GI/Hepatic negative GI ROS, Neg liver ROS,,,  Endo/Other  diabetes, Type 2    Renal/GU Renal disease  negative genitourinary   Musculoskeletal   Abdominal   Peds  Hematology negative hematology ROS (+)   Anesthesia Other Findings Patient is NPO appropriate and reports no nausea or vomiting today.  Past Medical History: No date: Chronic kidney disease, stage 3a No date: Diabetes mellitus without complication No date: HTN (hypertension) No date: Hypertension No date: Type II diabetes mellitus with renal manifestations  History reviewed. No pertinent surgical history.  BMI    Body Mass Index: 20.37 kg/m      Reproductive/Obstetrics negative OB ROS                             Anesthesia Physical Anesthesia Plan  ASA: 3  Anesthesia Plan: General   Post-op Pain Management:    Induction: Intravenous  PONV Risk Score and Plan: Propofol infusion and TIVA  Airway Management Planned: Natural Airway and Nasal Cannula  Additional Equipment:   Intra-op Plan:   Post-operative Plan:   Informed Consent: I have reviewed the patients History and Physical, chart, labs and discussed the procedure including the  risks, benefits and alternatives for the proposed anesthesia with the patient or authorized representative who has indicated his/her understanding and acceptance.     Dental Advisory Given  Plan Discussed with: Anesthesiologist, CRNA and Surgeon  Anesthesia Plan Comments: (Patient consented for risks of anesthesia including but not limited to:  - adverse reactions to medications - risk of airway placement if required - damage to eyes, teeth, lips or other oral mucosa - nerve damage due to positioning  - sore throat or hoarseness - Damage to heart, brain, nerves, lungs, other parts of body or loss of life  Patient voiced understanding.)       Anesthesia Quick Evaluation

## 2022-07-29 NOTE — Op Note (Signed)
[facility] Patient Name: Regina Perkins Procedure Date: 07/29/2022 9:55 AM MRN: [id number] Account #: [id number] Date of Birth: [birth year] Admit Type: Inpatient Age: 82 Room: [facility] ENDO ROOM 4 Gender: Female Note Status: Finalized Instrument Name: Upper Endoscope [id number] Procedure:             Upper GI endoscopy Indications:           Coffee-ground emesis, Hematemesis Providers:             Jonathon Bellows MD, MD Referring MD:          No Local Md, MD (Referring MD) Medicines:             Monitored Anesthesia Care Complications:         No immediate complications. Procedure:             Pre-Anesthesia Assessment:                        - Prior to the procedure, a History and Physical was                         performed, and patient medications, allergies and                         sensitivities were reviewed. The patient's tolerance                         of previous anesthesia was reviewed.                        - The risks and benefits of the procedure and the                         sedation options and risks were discussed with the                         patient. All questions were answered and informed                         consent was obtained.                        - ASA Grade Assessment: II - A patient with mild                         systemic disease.                        After obtaining informed consent, the endoscope was                         passed under direct vision. Throughout the procedure,                         the patient's blood pressure, pulse, and oxygen                         saturations were monitored continuously. The Endoscope  was introduced through the mouth, and advanced to the                         third part of duodenum. The upper GI endoscopy was                         accomplished with ease. The patient tolerated the                         procedure well. Findings:      The  esophagus was normal.      The stomach was normal.      One non-bleeding cratered duodenal ulcer with a clean ulcer base       (Forrest Class III) was found in the first portion of the duodenum. The       lesion was 20 mm in largest dimension.      The cardia and gastric fundus were normal on retroflexion. Impression:            - Normal esophagus.                        - Normal stomach.                        - Non-bleeding duodenal ulcer with a clean ulcer base                         (Forrest Class III).                        - No specimens collected. Recommendation:        - Return patient to hospital ward for ongoing care.                        - Advance diet as tolerated.                        - No ibuprofen, naproxen, or other non-steroidal                         anti-inflammatory drugs.                        - Use Prilosec (omeprazole) 40 mg PO BID for 3 months.                        - Repeat upper endoscopy in 2 months to evaluate the                         response to therapy. Procedure Code(s):     --- Professional ---                        [phone], Esophagogastroduodenoscopy, flexible,                         transoral; diagnostic, including collection of                         specimen(s) by brushing or washing, when performed                         (  separate procedure) Diagnosis Code(s):     --- Professional ---                        K26.9, Duodenal ulcer, unspecified as acute or                         chronic, without hemorrhage or perforation                        K92.0, Hematemesis CPT copyright 2022 American Medical Association. All rights reserved. The codes documented in this report are preliminary and upon coder review may  be revised to meet current compliance requirements. Jonathon Bellows, MD Jonathon Bellows MD, MD 07/29/2022 10:06:47 AM This report has been signed electronically. Number of Addenda: 0 Note Initiated On: 07/29/2022 9:55 AM Estimated Blood Loss:   Estimated blood loss: none.      [facility]

## 2022-07-29 NOTE — Assessment & Plan Note (Signed)
Presented with black tarry stool, hemoglobin nadir at 6, received 2 unit of PRBC with improvement of hemoglobin to 9.6 on discharge. Per patient she was on Eliquis but apparently no record found-she should not be on any anticoagulation at this time. EGD with a large nonbleeding duodenal ulcer, GI suggested Prilosec 40 mg twice daily for 3 months and she will need a repeat EGD in 2 months.

## 2022-07-30 ENCOUNTER — Encounter: Payer: Self-pay | Admitting: Gastroenterology

## 2022-07-30 LAB — TYPE AND SCREEN
ABO/RH(D): O POS
Antibody Screen: POSITIVE
Donor AG Type: NEGATIVE
Donor AG Type: NEGATIVE
Unit division: 0
Unit division: 0
Unit division: 0
Unit division: 0

## 2022-07-30 LAB — BPAM RBC
Blood Product Expiration Date: 202405092359
Blood Product Expiration Date: 202405092359
Blood Product Expiration Date: 202405092359
Blood Product Expiration Date: 202405092359
ISSUE DATE / TIME: 202404031351
ISSUE DATE / TIME: 202404040110
Unit Type and Rh: 5100
Unit Type and Rh: 5100
Unit Type and Rh: 5100
Unit Type and Rh: 5100

## 2022-08-04 ENCOUNTER — Encounter: Payer: Self-pay | Admitting: *Deleted

## 2022-08-04 ENCOUNTER — Inpatient Hospital Stay (HOSPITAL_BASED_OUTPATIENT_CLINIC_OR_DEPARTMENT_OTHER): Payer: Medicare Other | Admitting: Oncology

## 2022-08-04 ENCOUNTER — Inpatient Hospital Stay: Payer: Medicare Other

## 2022-08-04 ENCOUNTER — Inpatient Hospital Stay: Payer: Medicare Other | Attending: Oncology

## 2022-08-04 ENCOUNTER — Encounter: Payer: Self-pay | Admitting: Oncology

## 2022-08-04 VITALS — BP 130/63 | HR 81 | Temp 96.6°F | Wt 116.9 lb

## 2022-08-04 DIAGNOSIS — Z79899 Other long term (current) drug therapy: Secondary | ICD-10-CM | POA: Diagnosis not present

## 2022-08-04 DIAGNOSIS — C3482 Malignant neoplasm of overlapping sites of left bronchus and lung: Secondary | ICD-10-CM

## 2022-08-04 DIAGNOSIS — C3412 Malignant neoplasm of upper lobe, left bronchus or lung: Secondary | ICD-10-CM | POA: Diagnosis present

## 2022-08-04 DIAGNOSIS — Z86711 Personal history of pulmonary embolism: Secondary | ICD-10-CM | POA: Insufficient documentation

## 2022-08-04 DIAGNOSIS — E46 Unspecified protein-calorie malnutrition: Secondary | ICD-10-CM | POA: Diagnosis not present

## 2022-08-04 DIAGNOSIS — E43 Unspecified severe protein-calorie malnutrition: Secondary | ICD-10-CM

## 2022-08-04 DIAGNOSIS — E1122 Type 2 diabetes mellitus with diabetic chronic kidney disease: Secondary | ICD-10-CM | POA: Diagnosis not present

## 2022-08-04 DIAGNOSIS — D649 Anemia, unspecified: Secondary | ICD-10-CM | POA: Insufficient documentation

## 2022-08-04 DIAGNOSIS — C787 Secondary malignant neoplasm of liver and intrahepatic bile duct: Secondary | ICD-10-CM | POA: Insufficient documentation

## 2022-08-04 DIAGNOSIS — N1831 Chronic kidney disease, stage 3a: Secondary | ICD-10-CM | POA: Insufficient documentation

## 2022-08-04 DIAGNOSIS — F17210 Nicotine dependence, cigarettes, uncomplicated: Secondary | ICD-10-CM | POA: Insufficient documentation

## 2022-08-04 DIAGNOSIS — Z7984 Long term (current) use of oral hypoglycemic drugs: Secondary | ICD-10-CM | POA: Insufficient documentation

## 2022-08-04 DIAGNOSIS — I129 Hypertensive chronic kidney disease with stage 1 through stage 4 chronic kidney disease, or unspecified chronic kidney disease: Secondary | ICD-10-CM | POA: Diagnosis not present

## 2022-08-04 DIAGNOSIS — Z5112 Encounter for antineoplastic immunotherapy: Secondary | ICD-10-CM | POA: Diagnosis present

## 2022-08-04 DIAGNOSIS — I2699 Other pulmonary embolism without acute cor pulmonale: Secondary | ICD-10-CM

## 2022-08-04 LAB — CMP (CANCER CENTER ONLY)
ALT: 12 U/L (ref 0–44)
AST: 24 U/L (ref 15–41)
Albumin: 3.4 g/dL — ABNORMAL LOW (ref 3.5–5.0)
Alkaline Phosphatase: 57 U/L (ref 38–126)
Anion gap: 9 (ref 5–15)
BUN: 23 mg/dL (ref 8–23)
CO2: 27 mmol/L (ref 22–32)
Calcium: 8.9 mg/dL (ref 8.9–10.3)
Chloride: 100 mmol/L (ref 98–111)
Creatinine: 1.06 mg/dL — ABNORMAL HIGH (ref 0.44–1.00)
GFR, Estimated: 53 mL/min — ABNORMAL LOW (ref >60)
Glucose, Bld: 258 mg/dL — ABNORMAL HIGH (ref 70–99)
Potassium: 3.7 mmol/L (ref 3.5–5.1)
Sodium: 136 mmol/L (ref 135–145)
Total Bilirubin: 0.5 mg/dL (ref 0.3–1.2)
Total Protein: 7 g/dL (ref 6.5–8.1)

## 2022-08-04 LAB — IRON AND TIBC
Iron: 25 ug/dL — ABNORMAL LOW (ref 28–170)
Saturation Ratios: 9 % — ABNORMAL LOW (ref 10.4–31.8)
TIBC: 295 ug/dL (ref 250–450)
UIBC: 270 ug/dL

## 2022-08-04 LAB — CBC WITH DIFFERENTIAL (CANCER CENTER ONLY)
Abs Immature Granulocytes: 0.13 10*3/uL — ABNORMAL HIGH (ref 0.00–0.07)
Basophils Absolute: 0 10*3/uL (ref 0.0–0.1)
Basophils Relative: 0 %
Eosinophils Absolute: 0.2 10*3/uL (ref 0.0–0.5)
Eosinophils Relative: 2 %
HCT: 30.3 % — ABNORMAL LOW (ref 36.0–46.0)
Hemoglobin: 9.4 g/dL — ABNORMAL LOW (ref 12.0–15.0)
Immature Granulocytes: 1 %
Lymphocytes Relative: 11 %
Lymphs Abs: 1 10*3/uL (ref 0.7–4.0)
MCH: 25.7 pg — ABNORMAL LOW (ref 26.0–34.0)
MCHC: 31 g/dL (ref 30.0–36.0)
MCV: 82.8 fL (ref 80.0–100.0)
Monocytes Absolute: 0.6 10*3/uL (ref 0.1–1.0)
Monocytes Relative: 7 %
Neutro Abs: 7.2 10*3/uL (ref 1.7–7.7)
Neutrophils Relative %: 79 %
Platelet Count: 345 10*3/uL (ref 150–400)
RBC: 3.66 MIL/uL — ABNORMAL LOW (ref 3.87–5.11)
RDW: 19.9 % — ABNORMAL HIGH (ref 11.5–15.5)
WBC Count: 9.2 10*3/uL (ref 4.0–10.5)
nRBC: 0 % (ref 0.0–0.2)

## 2022-08-04 LAB — RETIC PANEL
Immature Retic Fract: 13.8 % (ref 2.3–15.9)
RBC.: 3.68 MIL/uL — ABNORMAL LOW (ref 3.87–5.11)
Retic Count, Absolute: 67.7 10*3/uL (ref 19.0–186.0)
Retic Ct Pct: 1.8 % (ref 0.4–3.1)
Reticulocyte Hemoglobin: 28.2 pg (ref >27.9)

## 2022-08-04 LAB — VITAMIN B12: Vitamin B-12: 312 pg/mL (ref 180–914)

## 2022-08-04 LAB — FERRITIN: Ferritin: 146 ng/mL (ref 11–307)

## 2022-08-04 LAB — FOLATE: Folate: 19.5 ng/mL (ref >5.9)

## 2022-08-04 MED ORDER — SODIUM CHLORIDE 0.9 % IV SOLN
200.0000 mg | Freq: Once | INTRAVENOUS | Status: AC
  Administered 2022-08-04: 200 mg via INTRAVENOUS
  Filled 2022-08-04: qty 8

## 2022-08-04 MED ORDER — SODIUM CHLORIDE 0.9 % IV SOLN
Freq: Once | INTRAVENOUS | Status: AC
  Filled 2022-08-04: qty 250

## 2022-08-04 NOTE — Patient Instructions (Signed)
[facility]  Discharge Instructions: Thank you for choosing [facility] to provide your oncology and hematology care.  If you have a lab appointment with the Cancer Center, please go directly to the Cancer Center and check in at the registration area.  Wear comfortable clothing and clothing appropriate for easy access to any Portacath or PICC line.   We strive to give you quality time with your provider. You may need to reschedule your appointment if you arrive late (15 or more minutes).  Arriving late affects you and other patients whose appointments are after yours.  Also, if you miss three or more appointments without notifying the office, you may be dismissed from the clinic at the provider's discretion.      For prescription refill requests, have your pharmacy contact our office and allow 72 hours for refills to be completed.    Today you received the following chemotherapy and/or immunotherapy agents- Keytruda      To help prevent nausea and vomiting after your treatment, we encourage you to take your nausea medication as directed.  BELOW ARE SYMPTOMS THAT SHOULD BE REPORTED IMMEDIATELY: *FEVER GREATER THAN 100.4 F (38 C) OR HIGHER *CHILLS OR SWEATING *NAUSEA AND VOMITING THAT IS NOT CONTROLLED WITH YOUR NAUSEA MEDICATION *UNUSUAL SHORTNESS OF BREATH *UNUSUAL BRUISING OR BLEEDING *URINARY PROBLEMS (pain or burning when urinating, or frequent urination) *BOWEL PROBLEMS (unusual diarrhea, constipation, pain near the anus) TENDERNESS IN MOUTH AND THROAT WITH OR WITHOUT PRESENCE OF ULCERS (sore throat, sores in mouth, or a toothache) UNUSUAL RASH, SWELLING OR PAIN  UNUSUAL VAGINAL DISCHARGE OR ITCHING   Items with * indicate a potential emergency and should be followed up as soon as possible or go to the Emergency Department if any problems should occur.  Please show the CHEMOTHERAPY ALERT CARD or IMMUNOTHERAPY ALERT CARD at check-in to  the Emergency Department and triage nurse.  Should you have questions after your visit or need to cancel or reschedule your appointment, please contact [facility] [place] [facility]  [phone] and follow the prompts.  Office hours are 8:00 a.m. to 4:30 p.m. Monday - Friday. Please note that voicemails left after 4:00 p.m. may not be returned until the following business day.  We are closed weekends and major holidays. You have access to a nurse at all times for urgent questions. Please call the main number to the clinic [phone] and follow the prompts.  For any non-urgent questions, you may also contact your provider using MyChart. We now offer e-Visits for anyone 18 and older to request care online for non-urgent symptoms. For details visit [email or web address].   Also download the MyChart app! Go to the app store, search "MyChart", open the app, select [facility], and log in with your MyChart username and password.   

## 2022-08-04 NOTE — Assessment & Plan Note (Signed)
Immunotherapy treatment plan as listed above. 

## 2022-08-04 NOTE — Assessment & Plan Note (Addendum)
She has been off  Elqiuis 5mg  BID due to GI bleeding

## 2022-08-04 NOTE — Progress Notes (Addendum)
Hematology/Oncology Consult Note [phone] [phone]     CHIEF COMPLAINTS/PURPOSE OF CONSULTATION:  Stage IV Lung squamous cell carcinoma  ASSESSMENT & PLAN:   Cancer Staging  Malignant neoplasm of lung Staging form: Lung, AJCC 8th Edition - Clinical: Stage IVA (cT4, cN3, cM1a) - Signed by Rickard Patience, MD on 06/28/2022   Malignant neoplasm of lung [facility]) Biopsy diagnosis were reviewed and discussed with patient.  Non-small cell lung cancer, favor squamous cell carcinoma, stage IV- TPS 90%, TMB 10, KRAS G12V MRI brain with and without contrast -  Left occipital focus of prior hemorrhage with mild linear contrast enhancement. Possible small metastasis or microhemmorhagic event.  TPS 90%, Labs are reviewed and discussed with patient. She tolerates immunotherapy Proceed with Timoteo Gaul lesion, repeat MRI in May 2024   Acute pulmonary embolism (HCC) She has been off  Elqiuis 5mg  BID due to GI bleeding  Malnutrition (HCC) Follow up with nutritionist,, weight is stable.   Encounter for antineoplastic immunotherapy Immunotherapy treatment plan as listed above.  Normocytic anemia Chronic anemia. Check iron tibc ferritin, B12, folate.   Orders Placed This Encounter  Procedures   MR Brain W Wo Contrast    Standing Status:   Future    Standing Expiration Date:   08/04/2023    Order Specific Question:   If indicated for the ordered procedure, I authorize the administration of contrast media per Radiology protocol    Answer:   Yes    Order Specific Question:   What is the patient's sedation requirement?    Answer:   No Sedation    Order Specific Question:   Does the patient have a pacemaker or implanted devices?    Answer:   No    Order Specific Question:   Use SRS Protocol?    Answer:   No    Order Specific Question:   Preferred imaging location?    Answer:   [facility] (table limit - 550lbs)   CBC with Differential (Cancer Center Only)     Standing Status:   Future    Standing Expiration Date:   09/15/2023   CMP (Cancer Center only)    Standing Status:   Future    Standing Expiration Date:   09/15/2023   Follow up in 3 weeks.   All questions were answered. The patient knows to call the clinic with any problems, questions or concerns.  Rickard Patience, MD, PhD [facility] Hematology Oncology 08/04/2022       HISTORY OF PRESENTING ILLNESS:  Regina Perkins 82 y.o. female presents to establish care for Stage IV Lung squamous cell carcinoma I have reviewed her chart and materials related to her cancer extensively and collaborated history with the patient. Summary of oncologic history is as follows: Oncology History  Malignant neoplasm of lung  05/24/2022 Imaging   CT chest angiogram showed 1. No evidence for pulmonary embolism. 2. Left upper lobe/prevascular mass worrisome for neoplasm. 3. Mediastinal and hilar lymphadenopathy. 4. Multiple pulmonary nodules measuring up to 11 mm worrisome for metastatic disease. 5. Small left pleural effusion. 6. Patchy ground-glass and airspace opacities in the left upper lobe worrisome for infection. 7. Moderate-sized pericardial effusion. 8. Cholelithiasis. 9. Nonobstructing left renal calculus   05/25/2022 Imaging   CT abdomen pelvis wo contrast 1. No acute findings or explanation for the patient's symptoms. 2. No evidence of primary malignancy or metastatic disease within the abdomen or pelvis on noncontrast imaging.  3. Cholelithiasis without evidence of cholecystitis or  biliary dilatation. 4. Nonobstructing left renal calculus. 5. Moderate stool throughout the colon suggesting constipation. 6.  Aortic Atherosclerosis    05/26/2022 Initial Diagnosis   Malignant neoplasm of lung - TPS 90%, TMB 10, KRAS G12V  -05/22/2022 in the emergency room, she was found to have hypoxia with oxygen levels of 86% on room air, improved to 94 with 2 L of oxygen.  D-dimer was elevated at 1.29, troponin  negative.  Lower extremity Doppler was negative for DVT.  VQ scan high probability PE (Large segmental perfusion defect of the left upper lobe without corresponding radiographic abnormality. Additional bilateral wedge-shaped subsegmental branch perfusion defects .  Patient was admitted and started on heparin Echocardiogram showed no RV strain.  Moderate pericardial effusion. Over her hospitalization, she continues to have shortness of breath with minimal exertion. 05/24/2022, CT chest angiogram showed left lung mass with bilateral lung nodules.   06/16/2022 Imaging   PET scan  1. 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 No evidence of metastatic disease in the abdomen or pelvis. 2. Moderate pericardial effusion. 3. Tiny left pleural effusion. 4. Left renal stone. 5. Aortic atherosclerosis (ICD10-I70.0). Coronary artery calcification. 6. Enlarged pulmonic trunk, indicative of pulmonary arterial hypertension    06/21/2022 Procedure   US guided liver right supraclvicular node biopsy showed Metastatic poorly differentiated carcinoma, favor squamous cell carcinoma, probably pulmonary origin.    06/28/2022 Cancer Staging   Staging form: Lung, AJCC 8th Edition - Clinical: Stage IVA (cT4, cN3, cM1a) - Signed by Rickard Patience, MD on 06/28/2022   07/14/2022 -  Chemotherapy   Patient is on Treatment Plan : LUNG NSCLC Pembrolizumab (200) q21d       INTERVAL HISTORY Regina Perkins is a 82 y.o. female who has above history reviewed by me today presents for follow up visit for Lung squamous cell carcinoma She tolerates [place] well. Denies skin rash, diarrhea.  chronic SOB with exertion, stable  She has been using her inhalers.  No new  complaints. Her daughter passed away last week. She is grieving.    MEDICAL HISTORY:  Past Medical History:  Diagnosis Date   Chronic kidney disease, stage 3a    Diabetes mellitus without complication    HTN (hypertension)    Hypertension    Type II diabetes mellitus with renal manifestations     SURGICAL HISTORY: Past Surgical History:  Procedure Laterality Date   ABDOMINAL HYSTERECTOMY     BACK SURGERY     1988 and 1998   ESOPHAGOGASTRODUODENOSCOPY (EGD) WITH PROPOFOL N/A 07/29/2022   Procedure: ESOPHAGOGASTRODUODENOSCOPY (EGD) WITH PROPOFOL;  Surgeon: Wyline Mood, MD;  Location: [facility] ENDOSCOPY;  Service: Gastroenterology;  Laterality: N/A;    SOCIAL HISTORY: Social History   Socioeconomic History   Marital status: Widowed    Spouse name: Not on file   Number of children: Not on file   Years of education: Not on file   Highest education level: Not on file  Occupational History   Not on file  Tobacco Use   Smoking status: Every Day    Packs/day: .5    Types: Cigarettes   Smokeless tobacco: Never  Vaping Use   Vaping Use: Never used  Substance and Sexual Activity   Alcohol use: No   Drug use: No   Sexual activity: Not on  file  Other Topics Concern   Not on file  Social History Narrative   Not on file   Social Determinants of Health   Financial Resource Strain: Low Risk  (06/28/2022)   Overall Financial Resource Strain (CARDIA)    Difficulty of Paying Living Expenses: Not very hard  Food Insecurity: Food Insecurity Present (06/28/2022)   Hunger Vital Sign    Worried About Programme researcher, broadcasting/film/video in the Last Year: Often true    Ran Out of Food in the Last Year: Often true  Transportation Needs: Unmet Transportation Needs (07/13/2022)   PRAPARE - Administrator, Civil Service (Medical): Yes    Lack of Transportation (Non-Medical): Yes  Physical Activity: Not on file  Stress: No Stress Concern Present (06/28/2022)   [employer] of Occupational Health  - Occupational Stress Questionnaire    Feeling of Stress : Not at all  Social Connections: Not on file  Intimate Partner Violence: Not At Risk (06/28/2022)   Humiliation, Afraid, Rape, and Kick questionnaire    Fear of Current or Ex-Partner: No    Emotionally Abused: No    Physically Abused: No    Sexually Abused: No    FAMILY HISTORY: Family History  Problem Relation Age of Onset   Asthma Mother    Heart disease Mother    Hypertension Mother    Diabetes Mother    Stroke Mother    Diabetes Sister    Diabetes Brother     ALLERGIES:  is allergic to ivp dye [iodinated contrast media] and shellfish allergy.  MEDICATIONS:  Current Outpatient Medications  Medication Sig Dispense Refill   albuterol (VENTOLIN HFA) 108 (90 Base) MCG/ACT inhaler Inhale 2 puffs into the lungs every 4 (four) hours as needed for wheezing or shortness of breath.     amLODipine (NORVASC) 10 MG tablet Take 10 mg by mouth daily.     benzonatate (TESSALON) 200 MG capsule Take 1 capsule (200 mg total) by mouth 3 (three) times daily as needed for cough. 20 capsule    BREZTRI AEROSPHERE 160-9-4.8 MCG/ACT AERO Inhale into the lungs.     fluticasone (FLONASE) 50 MCG/ACT nasal spray Place 1 spray into both nostrils daily.     Fluticasone-Umeclidin-Vilant (TRELEGY ELLIPTA) 100-62.5-25 MCG/ACT AEPB Inhale into the lungs.     lisinopril (ZESTRIL) 40 MG tablet Take 40 mg by mouth daily.     metFORMIN (GLUCOPHAGE) 1000 MG tablet Take 1,000 mg by mouth 2 (two) times daily with a meal.     omeprazole (PRILOSEC) 40 MG capsule Take 1 capsule (40 mg total) by mouth in the morning and at bedtime. 60 capsule 2   prochlorperazine (COMPAZINE) 10 MG tablet Take 1 tablet (10 mg total) by mouth every 6 (six) hours as needed for nausea or vomiting. 30 tablet 1   furosemide (LASIX) 40 MG tablet Take 1 tablet (40 mg total) by mouth daily. 60 tablet 0   No current facility-administered medications for this visit.    Review of Systems   Constitutional:  Negative for appetite change, chills, fatigue and fever.  HENT:   Negative for hearing loss and voice change.   Eyes:  Negative for eye problems.  Respiratory:  Positive for shortness of breath. Negative for chest tightness and cough.   Cardiovascular:  Negative for chest pain.  Gastrointestinal:  Negative for abdominal distention, abdominal pain and blood in stool.  Endocrine: Negative for hot flashes.  Genitourinary:  Negative for difficulty urinating and frequency.  Musculoskeletal:  Negative for arthralgias.  Skin:  Negative for itching and rash.  Neurological:  Negative for extremity weakness.  Hematological:  Negative for adenopathy.  Psychiatric/Behavioral:  Negative for confusion.      PHYSICAL EXAMINATION: ECOG PERFORMANCE STATUS: 2 - Symptomatic, <50% confined to bed  Vitals:   08/04/22 0934  BP: 130/63  Pulse: 81  Temp: (!) 96.6 F (35.9 C)   Filed Weights   08/04/22 0934  Weight: 116 lb 14.4 oz (53 kg)    Physical Exam Constitutional:      General: She is not in acute distress.    Appearance: She is not diaphoretic.  HENT:     Head: Normocephalic.  Eyes:     General: No scleral icterus.    Pupils: Pupils are equal, round, and reactive to light.  Cardiovascular:     Heart sounds: No murmur heard. Pulmonary:     Effort: Pulmonary effort is normal. No respiratory distress.     Comments: Decreased breath sounds bilaterally Abdominal:     General: There is no distension.  Musculoskeletal:        General: Normal range of motion.     Cervical back: Normal range of motion.  Skin:    Findings: No erythema.  Neurological:     Mental Status: She is alert and oriented to person, place, and time. Mental status is at baseline.     Cranial Nerves: No cranial nerve deficit.     Motor: No abnormal muscle tone.  Psychiatric:        Mood and Affect: Affect normal.      LABORATORY DATA:  I have reviewed the data as listed    Latest Ref Rng &  Units 08/04/2022    9:30 AM 07/29/2022    7:36 AM 07/28/2022    4:04 AM  CBC  WBC 4.0 - 10.5 K/uL 9.2  10.2  7.7   Hemoglobin 12.0 - 15.0 g/dL 9.4  9.6  6.4   Hematocrit 36.0 - 46.0 % 30.3  29.7  20.1   Platelets 150 - 400 K/uL 345  296  303       Latest Ref Rng & Units 08/04/2022    9:30 AM 07/29/2022    7:36 AM 07/28/2022    4:04 AM  CMP  Glucose 70 - 99 mg/dL 161  096  045   BUN 8 - 23 mg/dL 23  15  30    Creatinine 0.44 - 1.00 mg/dL 4.09  8.11  9.14   Sodium 135 - 145 mmol/L 136  138  138   Potassium 3.5 - 5.1 mmol/L 3.7  3.0  3.2   Chloride 98 - 111 mmol/L 100  103  105   CO2 22 - 32 mmol/L 27  26  25    Calcium 8.9 - 10.3 mg/dL 8.9  8.3  8.2   Total Protein 6.5 - 8.1 g/dL 7.0   5.6   Total Bilirubin 0.3 - 1.2 mg/dL 0.5   0.4   Alkaline Phos 38 - 126 U/L 57   42   AST 15 - 41 U/L 24   19   ALT 0 - 44 U/L 12   11      RADIOGRAPHIC STUDIES: I have personally reviewed the radiological images as listed and agreed with the findings in the report. DG Chest Portable 1 View  Result Date: 07/27/2022 CLINICAL DATA:  sob EXAM: PORTABLE CHEST - 1 VIEW COMPARISON:  05/24/2022 FINDINGS: Progressive left upper lobe opacity obscuring the  aortic arch. Improving consolidation/atelectasis at the left lung base with infrahilar residual. Right lung clear. Heart size within normal limits. Mild elevation of the left diaphragmatic leaflet. Vertebral endplate spurring at multiple levels in the lower thoracic spine. IMPRESSION: 1. Progressive left upper lobe opacity. 2. Improving left lower lobe consolidation/atelectasis. Electronically Signed   By: Corlis Leak M.D.   On: 07/27/2022 14:18   MR Brain W Wo Contrast  Result Date: 07/11/2022 CLINICAL DATA:  Right frontal headache.  Lung carcinoma. EXAM: MRI HEAD WITHOUT AND WITH CONTRAST TECHNIQUE: Multiplanar, multiecho pulse sequences of the brain and surrounding structures were obtained without and with intravenous contrast. CONTRAST:  5mL GADAVIST GADOBUTROL 1  MMOL/ML IV SOLN COMPARISON:  None Available. FINDINGS: Brain: Small focus of chronic hemorrhage in the left occipital lobe. There is a linear focus of contrast enhancement also at the site (series 15, image 24 and series 1001, image 60). No other parenchymal contrast enhancing focus. No acute infarct. There is multifocal hyperintense T2-weighted signal within the white matter. Generalized volume loss. 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 The midline structures are normal. Vascular: Normal flow voids. Skull and upper cervical spine: Normal marrow signal. Sinuses/Orbits: Negative. Other: None. IMPRESSION: 1. Left occipital focus of prior hemorrhage with mild linear contrast enhancement. This may indicate a small metastasis or sequela of a nonspecific microhemmorhagic event. 2. No other parenchymal contrast enhancing focus. 3. Chronic microvascular ischemia and volume loss. 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 Electronically Signed   By: Deatra Robinson M.D.   On: 07/11/2022 02:20

## 2022-08-04 NOTE — Assessment & Plan Note (Addendum)
Follow up with nutritionist,, weight is stable.

## 2022-08-04 NOTE — Assessment & Plan Note (Addendum)
Biopsy diagnosis were reviewed and discussed with patient.  Non-small cell lung cancer, favor squamous cell carcinoma, stage IV- TPS 90%, TMB 10, KRAS G12V MRI brain with and without contrast -  Left occipital focus of prior hemorrhage with mild linear contrast enhancement. Possible small metastasis or microhemmorhagic event.  TPS 90%, Labs are reviewed and discussed with patient. She tolerates immunotherapy Proceed with Timoteo Gaul lesion, repeat MRI in May 2024

## 2022-08-04 NOTE — Progress Notes (Signed)
Nutrition Follow-up:  Patient with stage IV lung cancer.  Patient receiving [place].    Met with patient during infusion. Reports that daughter died recently.  Patient living with grand-daughter with her 10 month old child.  Reports that appetite has been fair.  Noted recent hospitalization for GI bleed.  States that she is drinking 2-3 either ensure complete or ensure max protein shakes a day.   Last night ate hot dog with baked beans for supper.  Lunch was fried chicken and breakfast was bacon, egg and cheese biscuit.      Medications: reviewed  Labs: reviewed  Anthropometrics:   Weight 116 lb 14.4 oz today 115 lb 11.2 oz on 3/15 UBW 140 lb about 6 months ago   NUTRITION DIAGNOSIS: Inadequate oral intake stable    INTERVENTION:  Encouraged high calorie, high protein foods to continue weight gain Continue high calorie shakes for added nutrition     MONITORING, EVALUATION, GOAL: weight trends, intake   NEXT VISIT: Wednesday, May 1 during infusion  Jerell Demery B. Freida Busman, RD, LDN Registered Dietitian [phone]

## 2022-08-04 NOTE — Assessment & Plan Note (Signed)
Chronic anemia. Check iron tibc ferritin, B12, folate.  

## 2022-08-25 ENCOUNTER — Inpatient Hospital Stay: Payer: Medicare Other

## 2022-08-25 ENCOUNTER — Inpatient Hospital Stay: Payer: Medicare Other | Attending: Oncology

## 2022-08-25 ENCOUNTER — Inpatient Hospital Stay (HOSPITAL_BASED_OUTPATIENT_CLINIC_OR_DEPARTMENT_OTHER): Payer: Medicare Other | Admitting: Oncology

## 2022-08-25 ENCOUNTER — Encounter: Payer: Self-pay | Admitting: Oncology

## 2022-08-25 VITALS — BP 155/68 | HR 80 | Temp 96.0°F | Resp 18 | Wt 118.1 lb

## 2022-08-25 DIAGNOSIS — D649 Anemia, unspecified: Secondary | ICD-10-CM | POA: Insufficient documentation

## 2022-08-25 DIAGNOSIS — C3482 Malignant neoplasm of overlapping sites of left bronchus and lung: Secondary | ICD-10-CM | POA: Diagnosis not present

## 2022-08-25 DIAGNOSIS — F17210 Nicotine dependence, cigarettes, uncomplicated: Secondary | ICD-10-CM | POA: Diagnosis not present

## 2022-08-25 DIAGNOSIS — C787 Secondary malignant neoplasm of liver and intrahepatic bile duct: Secondary | ICD-10-CM | POA: Diagnosis present

## 2022-08-25 DIAGNOSIS — I2699 Other pulmonary embolism without acute cor pulmonale: Secondary | ICD-10-CM

## 2022-08-25 DIAGNOSIS — Z86711 Personal history of pulmonary embolism: Secondary | ICD-10-CM | POA: Diagnosis not present

## 2022-08-25 DIAGNOSIS — C3412 Malignant neoplasm of upper lobe, left bronchus or lung: Secondary | ICD-10-CM | POA: Insufficient documentation

## 2022-08-25 DIAGNOSIS — E43 Unspecified severe protein-calorie malnutrition: Secondary | ICD-10-CM

## 2022-08-25 DIAGNOSIS — Z79899 Other long term (current) drug therapy: Secondary | ICD-10-CM | POA: Diagnosis not present

## 2022-08-25 DIAGNOSIS — Z5112 Encounter for antineoplastic immunotherapy: Secondary | ICD-10-CM

## 2022-08-25 DIAGNOSIS — E46 Unspecified protein-calorie malnutrition: Secondary | ICD-10-CM | POA: Insufficient documentation

## 2022-08-25 DIAGNOSIS — Z7901 Long term (current) use of anticoagulants: Secondary | ICD-10-CM | POA: Diagnosis not present

## 2022-08-25 LAB — CMP (CANCER CENTER ONLY)
ALT: 21 U/L (ref 0–44)
AST: 31 U/L (ref 15–41)
Albumin: 3.7 g/dL (ref 3.5–5.0)
Alkaline Phosphatase: 64 U/L (ref 38–126)
Anion gap: 10 (ref 5–15)
BUN: 27 mg/dL — ABNORMAL HIGH (ref 8–23)
CO2: 27 mmol/L (ref 22–32)
Calcium: 9.3 mg/dL (ref 8.9–10.3)
Chloride: 98 mmol/L (ref 98–111)
Creatinine: 0.92 mg/dL (ref 0.44–1.00)
GFR, Estimated: >60 mL/min (ref >60)
Glucose, Bld: 176 mg/dL — ABNORMAL HIGH (ref 70–99)
Potassium: 3.6 mmol/L (ref 3.5–5.1)
Sodium: 135 mmol/L (ref 135–145)
Total Bilirubin: 0.5 mg/dL (ref 0.3–1.2)
Total Protein: 7.2 g/dL (ref 6.5–8.1)

## 2022-08-25 LAB — CBC WITH DIFFERENTIAL (CANCER CENTER ONLY)
Abs Immature Granulocytes: 0.04 10*3/uL (ref 0.00–0.07)
Basophils Absolute: 0.1 10*3/uL (ref 0.0–0.1)
Basophils Relative: 1 %
Eosinophils Absolute: 0.3 10*3/uL (ref 0.0–0.5)
Eosinophils Relative: 4 %
HCT: 34.3 % — ABNORMAL LOW (ref 36.0–46.0)
Hemoglobin: 10.6 g/dL — ABNORMAL LOW (ref 12.0–15.0)
Immature Granulocytes: 1 %
Lymphocytes Relative: 25 %
Lymphs Abs: 1.8 10*3/uL (ref 0.7–4.0)
MCH: 26.8 pg (ref 26.0–34.0)
MCHC: 30.9 g/dL (ref 30.0–36.0)
MCV: 86.6 fL (ref 80.0–100.0)
Monocytes Absolute: 0.5 10*3/uL (ref 0.1–1.0)
Monocytes Relative: 7 %
Neutro Abs: 4.7 10*3/uL (ref 1.7–7.7)
Neutrophils Relative %: 62 %
Platelet Count: 287 10*3/uL (ref 150–400)
RBC: 3.96 MIL/uL (ref 3.87–5.11)
RDW: 20.6 % — ABNORMAL HIGH (ref 11.5–15.5)
WBC Count: 7.4 10*3/uL (ref 4.0–10.5)
nRBC: 0 % (ref 0.0–0.2)

## 2022-08-25 LAB — TSH: TSH: 2.126 u[IU]/mL (ref 0.350–4.500)

## 2022-08-25 MED ORDER — SODIUM CHLORIDE 0.9 % IV SOLN
Freq: Once | INTRAVENOUS | Status: AC
  Filled 2022-08-25: qty 250

## 2022-08-25 MED ORDER — SODIUM CHLORIDE 0.9 % IV SOLN
200.0000 mg | Freq: Once | INTRAVENOUS | Status: AC
  Administered 2022-08-25: 200 mg via INTRAVENOUS
  Filled 2022-08-25: qty 8

## 2022-08-25 MED ORDER — VITAMIN B-12 1000 MCG PO TABS: 1000.0000 ug | ORAL_TABLET | Freq: Every day | ORAL | 0 refills | Status: DC

## 2022-08-25 NOTE — Progress Notes (Addendum)
Hematology/Oncology Consult Note Telephone:([phone]) [phone] Fax:([phone]) [phone]     CHIEF COMPLAINTS/PURPOSE OF CONSULTATION:  Stage IV Lung squamous cell carcinoma  ASSESSMENT & PLAN:   Cancer Staging  Malignant neoplasm of lung (HCC) Staging form: Lung, AJCC 8th Edition - Clinical: Stage IVA (cT4, cN3, cM1a) - Signed by Rickard Patience, MD on 06/28/2022   Malignant neoplasm of lung (HCC) Non-small cell lung cancer, favor squamous cell carcinoma, stage IV- TPS 90%, TMB 10, KRAS G12V MRI brain with and without contrast -  Left occipital focus of prior hemorrhage with mild linear contrast enhancement. Possible small metastasis or microhemmorhagic event.  TPS 90%, Labs are reviewed and discussed with patient. She tolerates immunotherapy Proceed with Timoteo Gaul lesion, repeat MRI in May 2024 Repeat CT scan after 4 cycles of Keytruda.  Considering her age and contrast allergy, I ordered CT without contrast for the next treatment response evaluation.   Acute pulmonary embolism (HCC) She has been off  Elqiuis 5mg  BID due to GI bleeding  Malnutrition (HCC) Follow up with nutritionist,, weight is stable.   Encounter for antineoplastic immunotherapy Immunotherapy treatment plan as listed above.  Normocytic anemia Chronic anemia.  Hemoglobin is improving. Vitamin B12 level is within normal limits, low normal iron.  Recommend empiric vitamin B12 1000 mcg daily x 3 months.  Prescription sent to pharmacy.  Orders Placed This Encounter  Procedures   CT CHEST ABDOMEN PELVIS WO CONTRAST    Standing Status:   Future    Standing Expiration Date:   08/25/2023    Order Specific Question:   Preferred imaging location?    Answer:   [facility]    Order Specific Question:   If indicated for the ordered procedure, I authorize the administration of oral contrast media per Radiology protocol    Answer:   Yes    Order Specific Question:   Does the patient have a contrast media/X-ray dye allergy?     Answer:   Yes   CBC with Differential (Cancer Center Only)    Standing Status:   Future    Standing Expiration Date:   10/06/2023   CMP (Cancer Center only)    Standing Status:   Future    Standing Expiration Date:   10/06/2023   Follow up in 3 weeks.   All questions were answered. The patient knows to call the clinic with any problems, questions or concerns.  Rickard Patience, MD, PhD [facility] Hematology Oncology 08/25/2022       HISTORY OF PRESENTING ILLNESS:  Regina Perkins 82 y.o. female presents to establish care for Stage IV Lung squamous cell carcinoma I have reviewed her chart and materials related to her cancer extensively and collaborated history with the patient. Summary of oncologic history is as follows: Oncology History  Malignant neoplasm of lung (HCC)  05/24/2022 Imaging   CT chest angiogram showed 1. No evidence for pulmonary embolism. 2. Left upper lobe/prevascular mass worrisome for neoplasm. 3. Mediastinal and hilar lymphadenopathy. 4. Multiple pulmonary nodules measuring up to 11 mm worrisome for metastatic disease. 5. Small left pleural effusion. 6. Patchy ground-glass and airspace opacities in the left upper lobe worrisome for infection. 7. Moderate-sized pericardial effusion. 8. Cholelithiasis. 9. Nonobstructing left renal calculus   05/25/2022 Imaging   CT abdomen pelvis wo contrast 1. No acute findings or explanation for the patient's symptoms. 2. No evidence of primary malignancy or metastatic disease within the abdomen or pelvis on noncontrast imaging.  3. Cholelithiasis without evidence of cholecystitis or  biliary dilatation. 4. Nonobstructing left renal calculus. 5. Moderate stool throughout the colon suggesting constipation. 6.  Aortic Atherosclerosis    05/26/2022 Initial Diagnosis   Malignant neoplasm of lung - TPS 90%, TMB 10, KRAS G12V  -05/22/2022 in the emergency room, she was found to have hypoxia with oxygen levels of 86% on room air,  improved to 94 with 2 L of oxygen.  D-dimer was elevated at 1.29, troponin negative.  Lower extremity Doppler was negative for DVT.  VQ scan high probability PE (Large segmental perfusion defect of the left upper lobe without corresponding radiographic abnormality. Additional bilateral wedge-shaped subsegmental branch perfusion defects .  Patient was admitted and started on heparin Echocardiogram showed no RV strain.  Moderate pericardial effusion. Over her hospitalization, she continues to have shortness of breath with minimal exertion. 05/24/2022, CT chest angiogram showed left lung mass with bilateral lung nodules.   06/16/2022 Imaging   PET scan  1. 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 No evidence of metastatic disease in the abdomen or pelvis. 2. Moderate pericardial effusion. 3. Tiny left pleural effusion. 4. Left renal stone. 5. Aortic atherosclerosis (ICD10-I70.0). Coronary artery calcification. 6. Enlarged pulmonic trunk, indicative of pulmonary arterial hypertension    06/21/2022 Procedure   US guided liver right supraclvicular node biopsy showed Metastatic poorly differentiated carcinoma, favor squamous cell carcinoma, probably pulmonary origin.    06/28/2022 Cancer Staging   Staging form: Lung, AJCC 8th Edition - Clinical: Stage IVA (cT4, cN3, cM1a) - Signed by Rickard Patience, MD on 06/28/2022   07/14/2022 -  Chemotherapy   Patient is on Treatment Plan : LUNG NSCLC Pembrolizumab (200) q21d       INTERVAL HISTORY Regina Perkins is a 82 y.o. female who has above history reviewed by me today presents for follow up visit for Lung squamous cell carcinoma She tolerates [place] well. Denies skin rash, diarrhea.  chronic SOB  with exertion, improved she has been using her inhalers.  No new complaints. Patient has gained 2 pounds since last visit.  Appetite is fair.   MEDICAL HISTORY:  Past Medical History:  Diagnosis Date   Chronic kidney disease, stage 3a (HCC)    Diabetes mellitus without complication (HCC)    HTN (hypertension)    Hypertension    Type II diabetes mellitus with renal manifestations (HCC)     SURGICAL HISTORY: Past Surgical History:  Procedure Laterality Date   ABDOMINAL HYSTERECTOMY     BACK SURGERY     1988 and 1998   ESOPHAGOGASTRODUODENOSCOPY (EGD) WITH PROPOFOL N/A 07/29/2022   Procedure: ESOPHAGOGASTRODUODENOSCOPY (EGD) WITH PROPOFOL;  Surgeon: Wyline Mood, MD;  Location: [facility] ENDOSCOPY;  Service: Gastroenterology;  Laterality: N/A;    SOCIAL HISTORY: Social History   Socioeconomic History   Marital status: Widowed    Spouse name: Not on file   Number of children: Not on file   Years of education: Not on file   Highest education level: Not on file  Occupational History   Not on file  Tobacco Use   Smoking status: Every Day    Packs/day: .5    Types: Cigarettes   Smokeless tobacco: Never  Vaping Use   Vaping Use: Never used  Substance and Sexual Activity   Alcohol use: No   Drug use: No  Sexual activity: Not on file  Other Topics Concern   Not on file  Social History Narrative   Not on file   Social Determinants of Health   Financial Resource Strain: Low Risk  (06/28/2022)   Overall Financial Resource Strain (CARDIA)    Difficulty of Paying Living Expenses: Not very hard  Food Insecurity: Food Insecurity Present (06/28/2022)   Hunger Vital Sign    Worried About Programme researcher, broadcasting/film/video in the Last Year: Often true    Ran Out of Food in the Last Year: Often true  Transportation Needs: Unmet Transportation Needs (07/13/2022)   PRAPARE - Administrator, Civil Service (Medical): Yes    Lack of Transportation (Non-Medical): Yes  Physical Activity: Not on  file  Stress: No Stress Concern Present (06/28/2022)   [employer] of Occupational Health - Occupational Stress Questionnaire    Feeling of Stress : Not at all  Social Connections: Not on file  Intimate Partner Violence: Not At Risk (06/28/2022)   Humiliation, Afraid, Rape, and Kick questionnaire    Fear of Current or Ex-Partner: No    Emotionally Abused: No    Physically Abused: No    Sexually Abused: No    FAMILY HISTORY: Family History  Problem Relation Age of Onset   Asthma Mother    Heart disease Mother    Hypertension Mother    Diabetes Mother    Stroke Mother    Diabetes Sister    Diabetes Brother     ALLERGIES:  is allergic to ivp dye [iodinated contrast media] and shellfish allergy.  MEDICATIONS:  Current Outpatient Medications  Medication Sig Dispense Refill   albuterol (VENTOLIN HFA) 108 (90 Base) MCG/ACT inhaler Inhale 2 puffs into the lungs every 4 (four) hours as needed for wheezing or shortness of breath.     amLODipine (NORVASC) 10 MG tablet Take 10 mg by mouth daily.     benzonatate (TESSALON) 200 MG capsule Take 1 capsule (200 mg total) by mouth 3 (three) times daily as needed for cough. 20 capsule    BREZTRI AEROSPHERE 160-9-4.8 MCG/ACT AERO Inhale into the lungs.     cyanocobalamin (VITAMIN B12) 1000 MCG tablet Take 1 tablet (1,000 mcg total) by mouth daily. 90 tablet 0   fluticasone (FLONASE) 50 MCG/ACT nasal spray Place 1 spray into both nostrils daily.     Fluticasone-Umeclidin-Vilant (TRELEGY ELLIPTA) 100-62.5-25 MCG/ACT AEPB Inhale into the lungs.     furosemide (LASIX) 40 MG tablet Take 1 tablet (40 mg total) by mouth daily. 60 tablet 0   lisinopril (ZESTRIL) 40 MG tablet Take 40 mg by mouth daily.     metFORMIN (GLUCOPHAGE) 1000 MG tablet Take 1,000 mg by mouth 2 (two) times daily with a meal.     omeprazole (PRILOSEC) 40 MG capsule Take 1 capsule (40 mg total) by mouth in the morning and at bedtime. 60 capsule 2   prochlorperazine (COMPAZINE) 10  MG tablet Take 1 tablet (10 mg total) by mouth every 6 (six) hours as needed for nausea or vomiting. 30 tablet 1   No current facility-administered medications for this visit.    Review of Systems  Constitutional:  Negative for appetite change, chills, fatigue and fever.  HENT:   Negative for hearing loss and voice change.   Eyes:  Negative for eye problems.  Respiratory:  Positive for shortness of breath. Negative for chest tightness and cough.   Cardiovascular:  Negative for chest pain.  Gastrointestinal:  Negative for abdominal  distention, abdominal pain and blood in stool.  Endocrine: Negative for hot flashes.  Genitourinary:  Negative for difficulty urinating and frequency.   Musculoskeletal:  Negative for arthralgias.  Skin:  Negative for itching and rash.  Neurological:  Negative for extremity weakness.  Hematological:  Negative for adenopathy.  Psychiatric/Behavioral:  Negative for confusion.      PHYSICAL EXAMINATION: ECOG PERFORMANCE STATUS: 2 - Symptomatic, <50% confined to bed  Vitals:   08/25/22 0905  BP: (!) 155/68  Pulse: 80  Resp: 18  Temp: (!) 96 F (35.6 C)  SpO2: 96%   Filed Weights   08/25/22 0905  Weight: 118 lb 1.6 oz (53.6 kg)    Physical Exam Constitutional:      General: She is not in acute distress.    Appearance: She is not diaphoretic.  HENT:     Head: Normocephalic.  Eyes:     General: No scleral icterus.    Pupils: Pupils are equal, round, and reactive to light.  Cardiovascular:     Heart sounds: No murmur heard. Pulmonary:     Effort: Pulmonary effort is normal. No respiratory distress.     Comments: Decreased breath sounds bilaterally Abdominal:     General: There is no distension.  Musculoskeletal:        General: Normal range of motion.     Cervical back: Normal range of motion.  Skin:    Findings: No erythema.  Neurological:     Mental Status: She is alert and oriented to person, place, and time. Mental status is at  baseline.     Cranial Nerves: No cranial nerve deficit.     Motor: No abnormal muscle tone.  Psychiatric:        Mood and Affect: Affect normal.      LABORATORY DATA:  I have reviewed the data as listed    Latest Ref Rng & Units 08/25/2022    8:42 AM 08/04/2022    9:30 AM 07/29/2022    7:36 AM  CBC  WBC 4.0 - 10.5 K/uL 7.4  9.2  10.2   Hemoglobin 12.0 - 15.0 g/dL 91.4  9.4  9.6   Hematocrit 36.0 - 46.0 % 34.3  30.3  29.7   Platelets 150 - 400 K/uL 287  345  296       Latest Ref Rng & Units 08/25/2022    8:42 AM 08/04/2022    9:30 AM 07/29/2022    7:36 AM  CMP  Glucose 70 - 99 mg/dL 782  956  213   BUN 8 - 23 mg/dL 27  23  15    Creatinine 0.44 - 1.00 mg/dL 0.86  5.78  4.69   Sodium 135 - 145 mmol/L 135  136  138   Potassium 3.5 - 5.1 mmol/L 3.6  3.7  3.0   Chloride 98 - 111 mmol/L 98  100  103   CO2 22 - 32 mmol/L 27  27  26    Calcium 8.9 - 10.3 mg/dL 9.3  8.9  8.3   Total Protein 6.5 - 8.1 g/dL 7.2  7.0    Total Bilirubin 0.3 - 1.2 mg/dL 0.5  0.5    Alkaline Phos 38 - 126 U/L 64  57    AST 15 - 41 U/L 31  24    ALT 0 - 44 U/L 21  12       RADIOGRAPHIC STUDIES: I have personally reviewed the radiological images as listed and agreed with the findings in the  report. DG Chest Portable 1 View  Result Date: 07/27/2022 CLINICAL DATA:  sob EXAM: PORTABLE CHEST - 1 VIEW COMPARISON:  05/24/2022 FINDINGS: Progressive left upper lobe opacity obscuring the aortic arch. Improving consolidation/atelectasis at the left lung base with infrahilar residual. Right lung clear. Heart size within normal limits. Mild elevation of the left diaphragmatic leaflet. Vertebral endplate spurring at multiple levels in the lower thoracic spine. IMPRESSION: 1. Progressive left upper lobe opacity. 2. Improving left lower lobe consolidation/atelectasis. Electronically Signed   By: Corlis Leak M.D.   On: 07/27/2022 14:18

## 2022-08-25 NOTE — Assessment & Plan Note (Signed)
Immunotherapy treatment plan as listed above. 

## 2022-08-25 NOTE — Progress Notes (Signed)
Nutrition Follow-up:   Patient with stage IV lung cancer.  Patient receiving [place].  Met with patient during infusion.  Reports that appetite is better.  Family has been preparing meals for her and encouraging her to eat.  Has been eating eggs and bacon for breakfast.  Beef and broccoli last night for dinner.  Has been drinking ensure complete shakes 3 a day.  Likes peanut butter with saltines and graham crackers.      Medications: reviewed  Labs: reviewed  Anthropometrics:   Weight 118 lb 1.6 oz  116 lb 14.4 on 4/9 115 lb on 3/15   NUTRITION DIAGNOSIS: Inadequate oral intake stable    INTERVENTION:  Continue 350 calorie shake  Continue high calorie,  high protein foods    MONITORING, EVALUATION, GOAL: weight trends, intake   NEXT VISIT: Wednesday, June 12 during infusion  Jazilyn Siegenthaler B. Freida Busman, RD, LDN Registered Dietitian [phone]

## 2022-08-25 NOTE — Assessment & Plan Note (Signed)
Follow up with nutritionist,, weight is stable.  

## 2022-08-25 NOTE — Patient Instructions (Signed)
[facility]  Discharge Instructions: Thank you for choosing [facility] to provide your oncology and hematology care.  If you have a lab appointment with the Cancer Center, please go directly to the Cancer Center and check in at the registration area.  Wear comfortable clothing and clothing appropriate for easy access to any Portacath or PICC line.   We strive to give you quality time with your provider. You may need to reschedule your appointment if you arrive late (15 or more minutes).  Arriving late affects you and other patients whose appointments are after yours.  Also, if you miss three or more appointments without notifying the office, you may be dismissed from the clinic at the provider's discretion.      For prescription refill requests, have your pharmacy contact our office and allow 72 hours for refills to be completed.    Today you received the following chemotherapy and/or immunotherapy agents- Keytruda      To help prevent nausea and vomiting after your treatment, we encourage you to take your nausea medication as directed.  BELOW ARE SYMPTOMS THAT SHOULD BE REPORTED IMMEDIATELY: *FEVER GREATER THAN 100.4 F (38 C) OR HIGHER *CHILLS OR SWEATING *NAUSEA AND VOMITING THAT IS NOT CONTROLLED WITH YOUR NAUSEA MEDICATION *UNUSUAL SHORTNESS OF BREATH *UNUSUAL BRUISING OR BLEEDING *URINARY PROBLEMS (pain or burning when urinating, or frequent urination) *BOWEL PROBLEMS (unusual diarrhea, constipation, pain near the anus) TENDERNESS IN MOUTH AND THROAT WITH OR WITHOUT PRESENCE OF ULCERS (sore throat, sores in mouth, or a toothache) UNUSUAL RASH, SWELLING OR PAIN  UNUSUAL VAGINAL DISCHARGE OR ITCHING   Items with * indicate a potential emergency and should be followed up as soon as possible or go to the Emergency Department if any problems should occur.  Please show the CHEMOTHERAPY ALERT CARD or IMMUNOTHERAPY ALERT CARD at check-in to  the Emergency Department and triage nurse.  Should you have questions after your visit or need to cancel or reschedule your appointment, please contact [facility]  [phone] and follow the prompts.  Office hours are 8:00 a.m. to 4:30 p.m. Monday - Friday. Please note that voicemails left after 4:00 p.m. may not be returned until the following business day.  We are closed weekends and major holidays. You have access to a nurse at all times for urgent questions. Please call the main number to the clinic [phone] and follow the prompts.  For any non-urgent questions, you may also contact your provider using MyChart. We now offer e-Visits for anyone 18 and older to request care online for non-urgent symptoms. For details visit [email or web address].   Also download the MyChart app! Go to the app store, search "MyChart", open the app, select [facility], and log in with your MyChart username and password.   

## 2022-08-25 NOTE — Assessment & Plan Note (Addendum)
She has been off  Elqiuis 5mg  BID due to GI bleeding

## 2022-08-25 NOTE — Assessment & Plan Note (Addendum)
Chronic anemia.  Hemoglobin is improving. Vitamin B12 level is within normal limits, low normal iron.  Recommend empiric vitamin B12 1000 mcg daily x 3 months.  Prescription sent to pharmacy.

## 2022-08-25 NOTE — Assessment & Plan Note (Addendum)
Non-small cell lung cancer, favor squamous cell carcinoma, stage IV- TPS 90%, TMB 10, KRAS G12V MRI brain with and without contrast -  Left occipital focus of prior hemorrhage with mild linear contrast enhancement. Possible small metastasis or microhemmorhagic event.  TPS 90%, Labs are reviewed and discussed with patient. She tolerates immunotherapy Proceed with Timoteo Gaul lesion, repeat MRI in May 2024 Repeat CT scan after 4 cycles of Keytruda.  Considering her age and contrast allergy, I ordered CT without contrast for the next treatment response evaluation.

## 2022-08-26 LAB — T4: T4, Total: 8.7 ug/dL (ref 4.5–12.0)

## 2022-09-15 ENCOUNTER — Inpatient Hospital Stay (HOSPITAL_BASED_OUTPATIENT_CLINIC_OR_DEPARTMENT_OTHER): Payer: Medicare Other | Admitting: Oncology

## 2022-09-15 ENCOUNTER — Inpatient Hospital Stay: Payer: Medicare Other

## 2022-09-15 ENCOUNTER — Encounter: Payer: Self-pay | Admitting: Oncology

## 2022-09-15 VITALS — BP 147/73 | HR 84 | Temp 97.2°F | Resp 18 | Wt 116.2 lb

## 2022-09-15 DIAGNOSIS — I2699 Other pulmonary embolism without acute cor pulmonale: Secondary | ICD-10-CM | POA: Diagnosis not present

## 2022-09-15 DIAGNOSIS — C3482 Malignant neoplasm of overlapping sites of left bronchus and lung: Secondary | ICD-10-CM

## 2022-09-15 DIAGNOSIS — E43 Unspecified severe protein-calorie malnutrition: Secondary | ICD-10-CM | POA: Diagnosis not present

## 2022-09-15 DIAGNOSIS — Z5112 Encounter for antineoplastic immunotherapy: Secondary | ICD-10-CM

## 2022-09-15 DIAGNOSIS — D649 Anemia, unspecified: Secondary | ICD-10-CM

## 2022-09-15 LAB — CBC WITH DIFFERENTIAL (CANCER CENTER ONLY)
Abs Immature Granulocytes: 0.09 10*3/uL — ABNORMAL HIGH (ref 0.00–0.07)
Basophils Absolute: 0 10*3/uL (ref 0.0–0.1)
Basophils Relative: 1 %
Eosinophils Absolute: 0.2 10*3/uL (ref 0.0–0.5)
Eosinophils Relative: 2 %
HCT: 35.7 % — ABNORMAL LOW (ref 36.0–46.0)
Hemoglobin: 11 g/dL — ABNORMAL LOW (ref 12.0–15.0)
Immature Granulocytes: 1 %
Lymphocytes Relative: 20 %
Lymphs Abs: 1.7 10*3/uL (ref 0.7–4.0)
MCH: 27 pg (ref 26.0–34.0)
MCHC: 30.8 g/dL (ref 30.0–36.0)
MCV: 87.5 fL (ref 80.0–100.0)
Monocytes Absolute: 0.7 10*3/uL (ref 0.1–1.0)
Monocytes Relative: 8 %
Neutro Abs: 5.9 10*3/uL (ref 1.7–7.7)
Neutrophils Relative %: 68 %
Platelet Count: 284 10*3/uL (ref 150–400)
RBC: 4.08 MIL/uL (ref 3.87–5.11)
RDW: 19.7 % — ABNORMAL HIGH (ref 11.5–15.5)
WBC Count: 8.5 10*3/uL (ref 4.0–10.5)
nRBC: 0 % (ref 0.0–0.2)

## 2022-09-15 LAB — CMP (CANCER CENTER ONLY)
ALT: 24 U/L (ref 0–44)
AST: 27 U/L (ref 15–41)
Albumin: 3.9 g/dL (ref 3.5–5.0)
Alkaline Phosphatase: 63 U/L (ref 38–126)
Anion gap: 9 (ref 5–15)
BUN: 38 mg/dL — ABNORMAL HIGH (ref 8–23)
CO2: 27 mmol/L (ref 22–32)
Calcium: 9.2 mg/dL (ref 8.9–10.3)
Chloride: 99 mmol/L (ref 98–111)
Creatinine: 0.9 mg/dL (ref 0.44–1.00)
GFR, Estimated: >60 mL/min (ref >60)
Glucose, Bld: 145 mg/dL — ABNORMAL HIGH (ref 70–99)
Potassium: 4.2 mmol/L (ref 3.5–5.1)
Sodium: 135 mmol/L (ref 135–145)
Total Bilirubin: 0.3 mg/dL (ref 0.3–1.2)
Total Protein: 7.1 g/dL (ref 6.5–8.1)

## 2022-09-15 MED ORDER — SODIUM CHLORIDE 0.9 % IV SOLN
200.0000 mg | Freq: Once | INTRAVENOUS | Status: AC
  Administered 2022-09-15: 200 mg via INTRAVENOUS
  Filled 2022-09-15: qty 200

## 2022-09-15 MED ORDER — SODIUM CHLORIDE 0.9 % IV SOLN
Freq: Once | INTRAVENOUS | Status: AC
  Filled 2022-09-15: qty 250

## 2022-09-15 NOTE — Assessment & Plan Note (Signed)
Non-small cell lung cancer, favor squamous cell carcinoma, stage IV- TPS 90%, TMB 10, KRAS G12V MRI brain with and without contrast -  Left occipital focus of prior hemorrhage with mild linear contrast enhancement. Possible small metastasis or microhemmorhagic event.  TPS 90%, Labs are reviewed and discussed with patient. She tolerates immunotherapy Proceed with Keytruda  Brian lesion, repeat MRI in May 2024 Repeat CT scan after 4 cycles of Keytruda.  Considering her age and contrast allergy, I ordered CT without contrast for the next treatment response evaluation.  

## 2022-09-15 NOTE — Assessment & Plan Note (Signed)
Immunotherapy treatment plan as listed above. 

## 2022-09-15 NOTE — Progress Notes (Addendum)
Hematology/Oncology Progress note [phone] [phone]        CHIEF COMPLAINTS/PURPOSE OF CONSULTATION:  Stage IV Lung squamous cell carcinoma  ASSESSMENT & PLAN:   Cancer Staging  Malignant neoplasm of lung (HCC) Staging form: Lung, AJCC 8th Edition - Clinical: Stage IVA (cT4, cN3, cM1a) - Signed by Rickard Patience, MD on 06/28/2022   Malignant neoplasm of lung (HCC) Non-small cell lung cancer, favor squamous cell carcinoma, stage IV- TPS 90%, TMB 10, KRAS G12V MRI brain with and without contrast -  Left occipital focus of prior hemorrhage with mild linear contrast enhancement. Possible small metastasis or microhemmorhagic event.  TPS 90%, Labs are reviewed and discussed with patient. She tolerates immunotherapy Proceed with Timoteo Gaul lesion, repeat MRI in May 2024 Repeat CT scan after 4 cycles of Keytruda.  Considering her age and contrast allergy, I ordered CT without contrast for the next treatment response evaluation.   Acute pulmonary embolism (HCC) She has been off  Elqiuis 5mg  BID due to GI bleeding  Malnutrition (HCC) Follow up with nutritionist,, weight is stable.   Encounter for antineoplastic immunotherapy Immunotherapy treatment plan as listed above.  Normocytic anemia Chronic anemia.  Hemoglobin is improving.   Orders Placed This Encounter  Procedures   CBC with Differential (Cancer Center Only)    Standing Status:   Future    Standing Expiration Date:   10/27/2023   CMP (Cancer Center only)    Standing Status:   Future    Standing Expiration Date:   10/27/2023   T4    Standing Status:   Future    Standing Expiration Date:   10/27/2023   TSH    Standing Status:   Future    Standing Expiration Date:   10/27/2023   Follow up in 3 weeks.   All questions were answered. The patient knows to call the clinic with any problems, questions or concerns.  Rickard Patience, MD, PhD [facility] Hematology Oncology 09/15/2022       HISTORY OF  PRESENTING ILLNESS:  Regina Perkins 82 y.o. female presents to establish care for Stage IV Lung squamous cell carcinoma I have reviewed her chart and materials related to her cancer extensively and collaborated history with the patient. Summary of oncologic history is as follows: Oncology History  Malignant neoplasm of lung (HCC)  05/24/2022 Imaging   CT chest angiogram showed 1. No evidence for pulmonary embolism. 2. Left upper lobe/prevascular mass worrisome for neoplasm. 3. Mediastinal and hilar lymphadenopathy. 4. Multiple pulmonary nodules measuring up to 11 mm worrisome for metastatic disease. 5. Small left pleural effusion. 6. Patchy ground-glass and airspace opacities in the left upper lobe worrisome for infection. 7. Moderate-sized pericardial effusion. 8. Cholelithiasis. 9. Nonobstructing left renal calculus   05/25/2022 Imaging   CT abdomen pelvis wo contrast 1. No acute findings or explanation for the patient'Perkins symptoms. 2. No evidence of primary malignancy or metastatic disease within the abdomen or pelvis on noncontrast imaging.  3. Cholelithiasis without evidence of cholecystitis or biliary dilatation. 4. Nonobstructing left renal calculus. 5. Moderate stool throughout the colon suggesting constipation. 6.  Aortic Atherosclerosis    05/26/2022 Initial Diagnosis   Malignant neoplasm of lung - TPS 90%, TMB 10, KRAS G12V  -05/22/2022 in the emergency room, she was found to have hypoxia with oxygen levels of 86% on room air, improved to 94 with 2 L of oxygen.  D-dimer was elevated at 1.29, troponin negative.  Lower extremity Doppler was negative for DVT.  VQ scan high probability PE (Large segmental perfusion defect of the left upper lobe without corresponding radiographic abnormality. Additional bilateral wedge-shaped subsegmental branch perfusion defects .  Patient was admitted and started on heparin Echocardiogram showed no RV strain.  Moderate pericardial effusion. Over her  hospitalization, she continues to have shortness of breath with minimal exertion. 05/24/2022, CT chest angiogram showed left lung mass with bilateral lung nodules.   06/16/2022 Imaging   PET scan  1. 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 No evidence of metastatic disease in the abdomen or pelvis. 2. Moderate pericardial effusion. 3. Tiny left pleural effusion. 4. Left renal stone. 5. Aortic atherosclerosis (ICD10-I70.0). Coronary artery calcification. 6. Enlarged pulmonic trunk, indicative of pulmonary arterial hypertension    06/21/2022 Procedure   US guided liver right supraclvicular node biopsy showed Metastatic poorly differentiated carcinoma, favor squamous cell carcinoma, probably pulmonary origin.    06/28/2022 Cancer Staging   Staging form: Lung, AJCC 8th Edition - Clinical: Stage IVA (cT4, cN3, cM1a) - Signed by Rickard Patience, MD on 06/28/2022   07/14/2022 -  Chemotherapy   Patient is on Treatment Plan : LUNG NSCLC Pembrolizumab (200) q21d       INTERVAL HISTORY [name] is a 82 y.o. female who has above history reviewed by me today presents for follow up visit for Lung squamous cell carcinoma She tolerates [place] well. Denies skin rash, diarrhea.  chronic SOB with exertion, improved she has been using her inhalers.  Patient reports feeling well.  No new complaints.  MEDICAL HISTORY:  Past Medical History:  Diagnosis Date   Chronic kidney disease, stage 3a (HCC)    Diabetes mellitus without complication (HCC)    HTN (hypertension)    Hypertension    Type II diabetes mellitus with renal manifestations (HCC)     SURGICAL HISTORY: Past Surgical History:  Procedure Laterality Date   ABDOMINAL  HYSTERECTOMY     BACK SURGERY     1988 and 1998   ESOPHAGOGASTRODUODENOSCOPY (EGD) WITH PROPOFOL N/A 07/29/2022   Procedure: ESOPHAGOGASTRODUODENOSCOPY (EGD) WITH PROPOFOL;  Surgeon: Wyline Mood, MD;  Location: [facility] ENDOSCOPY;  Service: Gastroenterology;  Laterality: N/A;    SOCIAL HISTORY: Social History   Socioeconomic History   Marital status: Widowed    Spouse name: Not on file   Number of children: Not on file   Years of education: Not on file   Highest education level: Not on file  Occupational History   Not on file  Tobacco Use   Smoking status: Every Day    Packs/day: .5    Types: Cigarettes   Smokeless tobacco: Never  Vaping Use   Vaping Use: Never used  Substance and Sexual Activity   Alcohol use: No   Drug use: No   Sexual activity: Not on file  Other Topics Concern   Not on file  Social History Narrative   Not on file   Social Determinants of Health   Financial Resource Strain: Low Risk  (06/28/2022)   Overall Financial Resource Strain (CARDIA)    Difficulty of Paying Living Expenses: Not very hard  Food Insecurity: Food Insecurity Present (06/28/2022)   Hunger Vital Sign    Worried About Running Out of Food in the Last Year: Often true    Ran Out of Food in  the Last Year: Often true  Transportation Needs: Unmet Transportation Needs (07/13/2022)   PRAPARE - Administrator, Civil Service (Medical): Yes    Lack of Transportation (Non-Medical): Yes  Physical Activity: Not on file  Stress: No Stress Concern Present (06/28/2022)   [employer] of Occupational Health - Occupational Stress Questionnaire    Feeling of Stress : Not at all  Social Connections: Not on file  Intimate Partner Violence: Not At Risk (06/28/2022)   Humiliation, Afraid, Rape, and Kick questionnaire    Fear of Current or Ex-Partner: No    Emotionally Abused: No    Physically Abused: No    Sexually Abused: No    FAMILY HISTORY: Family History  Problem Relation Age of Onset    Asthma Mother    Heart disease Mother    Hypertension Mother    Diabetes Mother    Stroke Mother    Diabetes Sister    Diabetes Brother     ALLERGIES:  is allergic to ivp dye [iodinated contrast media] and shellfish allergy.  MEDICATIONS:  Current Outpatient Medications  Medication Sig Dispense Refill   albuterol (VENTOLIN HFA) 108 (90 Base) MCG/ACT inhaler Inhale 2 puffs into the lungs every 4 (four) hours as needed for wheezing or shortness of breath.     amLODipine (NORVASC) 10 MG tablet Take 10 mg by mouth daily.     ascorbic acid (VITAMIN C) 500 MG tablet Take 500 mg by mouth daily.     benzonatate (TESSALON) 200 MG capsule Take 1 capsule (200 mg total) by mouth 3 (three) times daily as needed for cough. 20 capsule    BREZTRI AEROSPHERE 160-9-4.8 MCG/ACT AERO Inhale into the lungs.     cyanocobalamin (VITAMIN B12) 1000 MCG tablet Take 1 tablet (1,000 mcg total) by mouth daily. 90 tablet 0   fluticasone (FLONASE) 50 MCG/ACT nasal spray Place 1 spray into both nostrils daily.     Fluticasone-Umeclidin-Vilant (TRELEGY ELLIPTA) 100-62.5-25 MCG/ACT AEPB Inhale into the lungs.     furosemide (LASIX) 40 MG tablet Take 1 tablet (40 mg total) by mouth daily. 60 tablet 0   lisinopril (ZESTRIL) 40 MG tablet Take 40 mg by mouth daily.     metFORMIN (GLUCOPHAGE) 1000 MG tablet Take 1,000 mg by mouth 2 (two) times daily with a meal.     omeprazole (PRILOSEC) 40 MG capsule Take 1 capsule (40 mg total) by mouth in the morning and at bedtime. 60 capsule 2   prochlorperazine (COMPAZINE) 10 MG tablet Take 1 tablet (10 mg total) by mouth every 6 (six) hours as needed for nausea or vomiting. 30 tablet 1   No current facility-administered medications for this visit.    Review of Systems  Constitutional:  Negative for appetite change, chills, fatigue and fever.  HENT:   Negative for hearing loss and voice change.   Eyes:  Negative for eye problems.  Respiratory:  Positive for shortness of breath.  Negative for chest tightness and cough.   Cardiovascular:  Negative for chest pain.  Gastrointestinal:  Negative for abdominal distention, abdominal pain and blood in stool.  Endocrine: Negative for hot flashes.  Genitourinary:  Negative for difficulty urinating and frequency.   Musculoskeletal:  Negative for arthralgias.  Skin:  Negative for itching and rash.  Neurological:  Negative for extremity weakness.  Hematological:  Negative for adenopathy.  Psychiatric/Behavioral:  Negative for confusion.      PHYSICAL EXAMINATION: ECOG PERFORMANCE STATUS: 2 - Symptomatic, <50% confined to bed  Vitals:   09/15/22 1355  BP: (!) 147/73  Pulse: 84  Resp: 18  Temp: (!) 97.2 F (36.2 C)  SpO2: 100%   Filed Weights   09/15/22 1355  Weight: 116 lb 3.2 oz (52.7 kg)    Physical Exam Constitutional:      General: She is not in acute distress.    Appearance: She is not diaphoretic.  HENT:     Head: Normocephalic.  Eyes:     General: No scleral icterus.    Pupils: Pupils are equal, round, and reactive to light.  Cardiovascular:     Heart sounds: No murmur heard. Pulmonary:     Effort: Pulmonary effort is normal. No respiratory distress.     Comments: Decreased breath sounds bilaterally Abdominal:     General: There is no distension.  Musculoskeletal:        General: Normal range of motion.     Cervical back: Normal range of motion.  Skin:    Findings: No erythema.  Neurological:     Mental Status: She is alert and oriented to person, place, and time. Mental status is at baseline.     Cranial Nerves: No cranial nerve deficit.     Motor: No abnormal muscle tone.  Psychiatric:        Mood and Affect: Affect normal.      LABORATORY DATA:  I have reviewed the data as listed    Latest Ref Rng & Units 09/15/2022    1:40 PM 08/25/2022    8:42 AM 08/04/2022    9:30 AM  CBC  WBC 4.0 - 10.5 K/uL 8.5  7.4  9.2   Hemoglobin 12.0 - 15.0 g/dL 78.4  69.6  9.4   Hematocrit 36.0 - 46.0 %  35.7  34.3  30.3   Platelets 150 - 400 K/uL 284  287  345       Latest Ref Rng & Units 09/15/2022    1:40 PM 08/25/2022    8:42 AM 08/04/2022    9:30 AM  CMP  Glucose 70 - 99 mg/dL 295  284  132   BUN 8 - 23 mg/dL 38  27  23   Creatinine 0.44 - 1.00 mg/dL 4.40  1.02  7.25   Sodium 135 - 145 mmol/L 135  135  136   Potassium 3.5 - 5.1 mmol/L 4.2  3.6  3.7   Chloride 98 - 111 mmol/L 99  98  100   CO2 22 - 32 mmol/L 27  27  27    Calcium 8.9 - 10.3 mg/dL 9.2  9.3  8.9   Total Protein 6.5 - 8.1 g/dL 7.1  7.2  7.0   Total Bilirubin 0.3 - 1.2 mg/dL 0.3  0.5  0.5   Alkaline Phos 38 - 126 U/L 63  64  57   AST 15 - 41 U/L 27  31  24    ALT 0 - 44 U/L 24  21  12       RADIOGRAPHIC STUDIES: I have personally reviewed the radiological images as listed and agreed with the findings in the report. No results found.

## 2022-09-15 NOTE — Assessment & Plan Note (Addendum)
She has been off  Elqiuis 5mg  BID due to GI bleeding

## 2022-09-15 NOTE — Assessment & Plan Note (Signed)
Chronic anemia.  Hemoglobin is improving.

## 2022-09-15 NOTE — Patient Instructions (Signed)
[facility]  Discharge Instructions: Thank you for choosing [facility] to provide your oncology and hematology care.  If you have a lab appointment with the Cancer Center, please go directly to the Cancer Center and check in at the registration area.  Wear comfortable clothing and clothing appropriate for easy access to any Portacath or PICC line.   We strive to give you quality time with your provider. You may need to reschedule your appointment if you arrive late (15 or more minutes).  Arriving late affects you and other patients whose appointments are after yours.  Also, if you miss three or more appointments without notifying the office, you may be dismissed from the clinic at the provider's discretion.      For prescription refill requests, have your pharmacy contact our office and allow 72 hours for refills to be completed.    Today you received the following chemotherapy and/or immunotherapy agents- Keytruda      To help prevent nausea and vomiting after your treatment, we encourage you to take your nausea medication as directed.  BELOW ARE SYMPTOMS THAT SHOULD BE REPORTED IMMEDIATELY: *FEVER GREATER THAN 100.4 F (38 C) OR HIGHER *CHILLS OR SWEATING *NAUSEA AND VOMITING THAT IS NOT CONTROLLED WITH YOUR NAUSEA MEDICATION *UNUSUAL SHORTNESS OF BREATH *UNUSUAL BRUISING OR BLEEDING *URINARY PROBLEMS (pain or burning when urinating, or frequent urination) *BOWEL PROBLEMS (unusual diarrhea, constipation, pain near the anus) TENDERNESS IN MOUTH AND THROAT WITH OR WITHOUT PRESENCE OF ULCERS (sore throat, sores in mouth, or a toothache) UNUSUAL RASH, SWELLING OR PAIN  UNUSUAL VAGINAL DISCHARGE OR ITCHING   Items with * indicate a potential emergency and should be followed up as soon as possible or go to the Emergency Department if any problems should occur.  Please show the CHEMOTHERAPY ALERT CARD or IMMUNOTHERAPY ALERT CARD at check-in to  the Emergency Department and triage nurse.  Should you have questions after your visit or need to cancel or reschedule your appointment, please contact [facility] [place] [facility]  [phone] and follow the prompts.  Office hours are 8:00 a.m. to 4:30 p.m. Monday - Friday. Please note that voicemails left after 4:00 p.m. may not be returned until the following business day.  We are closed weekends and major holidays. You have access to a nurse at all times for urgent questions. Please call the main number to the clinic [phone] and follow the prompts.  For any non-urgent questions, you may also contact your provider using MyChart. We now offer e-Visits for anyone 18 and older to request care online for non-urgent symptoms. For details visit [email or web address].   Also download the MyChart app! Go to the app store, search "MyChart", open the app, select [facility], and log in with your MyChart username and password.   

## 2022-09-15 NOTE — Assessment & Plan Note (Signed)
Follow up with nutritionist,, weight is stable.  

## 2022-09-16 ENCOUNTER — Ambulatory Visit
Admission: RE | Admit: 2022-09-16 | Discharge: 2022-09-16 | Disposition: A | Discharge status: Home / Self Care | Payer: Medicare Other | Source: Ambulatory Visit | Attending: Oncology | Admitting: Oncology

## 2022-09-16 DIAGNOSIS — C3482 Malignant neoplasm of overlapping sites of left bronchus and lung: Secondary | ICD-10-CM | POA: Diagnosis present

## 2022-09-16 MED ORDER — GADOBUTROL 1 MMOL/ML IV SOLN
5.0000 mL | Freq: Once | INTRAVENOUS | Status: AC | PRN
  Administered 2022-09-16: 5 mL via INTRAVENOUS

## 2022-09-22 ENCOUNTER — Inpatient Hospital Stay (HOSPITAL_BASED_OUTPATIENT_CLINIC_OR_DEPARTMENT_OTHER): Payer: Medicare Other | Admitting: Hospice and Palliative Medicine

## 2022-09-22 DIAGNOSIS — C3482 Malignant neoplasm of overlapping sites of left bronchus and lung: Secondary | ICD-10-CM | POA: Diagnosis not present

## 2022-09-22 DIAGNOSIS — Z515 Encounter for palliative care: Secondary | ICD-10-CM | POA: Diagnosis not present

## 2022-09-22 NOTE — Progress Notes (Signed)
Virtual Visit via Telephone Note  I connected with Regina Perkins on 09/22/22 at 11:30 AM EDT by telephone and verified that I am speaking with the correct person using two identifiers.  Location: Patient: Home Provider: Clinic   I discussed the limitations, risks, security and privacy concerns of performing an evaluation and management service by telephone and the availability of in person appointments. I also discussed with the patient that there may be a patient responsible charge related to this service. The patient expressed understanding and agreed to proceed.   History of Present Illness: Regina Perkins is a 82 y.o. 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  Patient started on pembrolizumab.  She was referred to palliative care to address goals and manage ongoing symptoms.    Observations/Objective: I called and spoke with patient by phone.  Patient reports that she is doing well.  She denies any acute changes or concerns.  No symptomatic complaints at present.  Patient says that she is actually feeling better and is able to do more around the house.  She had some concerns about her MRI of the brain from last week, which is still pending read by radiology.  Assessment and Plan: Stage IV lung cancer -on treatment with Keytruda.  Sounds like patient is symptomatically stable.  Will continue to follow  Follow Up Instructions: Follow-up telephone visit 2-3 months   I discussed the assessment and treatment plan with the patient. The patient was provided an opportunity to ask questions and all were answered. The patient agreed with the plan and demonstrated an understanding of the instructions.   The patient was advised to call back or seek an in-person evaluation if the symptoms worsen or if the condition fails to improve as anticipated.  I provided 5 minutes of non-face-to-face time during this  encounter.   Malachy Moan, NP

## 2022-09-29 ENCOUNTER — Ambulatory Visit
Admission: RE | Admit: 2022-09-29 | Discharge: 2022-09-29 | Disposition: A | Discharge status: Home / Self Care | Payer: Medicare Other | Source: Ambulatory Visit | Attending: Oncology | Admitting: Oncology

## 2022-09-29 DIAGNOSIS — C3482 Malignant neoplasm of overlapping sites of left bronchus and lung: Secondary | ICD-10-CM

## 2022-10-06 ENCOUNTER — Inpatient Hospital Stay: Payer: Medicare Other | Attending: Oncology

## 2022-10-06 ENCOUNTER — Inpatient Hospital Stay (HOSPITAL_BASED_OUTPATIENT_CLINIC_OR_DEPARTMENT_OTHER): Payer: Medicare Other | Admitting: Oncology

## 2022-10-06 ENCOUNTER — Inpatient Hospital Stay: Payer: Medicare Other

## 2022-10-06 ENCOUNTER — Encounter: Payer: Self-pay | Admitting: Oncology

## 2022-10-06 VITALS — BP 111/61 | HR 75 | Temp 95.2°F | Resp 18 | Wt 115.1 lb

## 2022-10-06 DIAGNOSIS — C3412 Malignant neoplasm of upper lobe, left bronchus or lung: Secondary | ICD-10-CM | POA: Insufficient documentation

## 2022-10-06 DIAGNOSIS — I2699 Other pulmonary embolism without acute cor pulmonale: Secondary | ICD-10-CM

## 2022-10-06 DIAGNOSIS — Z86711 Personal history of pulmonary embolism: Secondary | ICD-10-CM | POA: Insufficient documentation

## 2022-10-06 DIAGNOSIS — C679 Malignant neoplasm of bladder, unspecified: Secondary | ICD-10-CM | POA: Insufficient documentation

## 2022-10-06 DIAGNOSIS — C3482 Malignant neoplasm of overlapping sites of left bronchus and lung: Secondary | ICD-10-CM | POA: Diagnosis not present

## 2022-10-06 DIAGNOSIS — N3289 Other specified disorders of bladder: Secondary | ICD-10-CM

## 2022-10-06 DIAGNOSIS — D649 Anemia, unspecified: Secondary | ICD-10-CM | POA: Insufficient documentation

## 2022-10-06 DIAGNOSIS — Z5112 Encounter for antineoplastic immunotherapy: Secondary | ICD-10-CM | POA: Insufficient documentation

## 2022-10-06 DIAGNOSIS — Z7984 Long term (current) use of oral hypoglycemic drugs: Secondary | ICD-10-CM | POA: Diagnosis not present

## 2022-10-06 DIAGNOSIS — E43 Unspecified severe protein-calorie malnutrition: Secondary | ICD-10-CM | POA: Diagnosis not present

## 2022-10-06 DIAGNOSIS — E46 Unspecified protein-calorie malnutrition: Secondary | ICD-10-CM | POA: Diagnosis not present

## 2022-10-06 DIAGNOSIS — C787 Secondary malignant neoplasm of liver and intrahepatic bile duct: Secondary | ICD-10-CM | POA: Insufficient documentation

## 2022-10-06 DIAGNOSIS — Z79899 Other long term (current) drug therapy: Secondary | ICD-10-CM | POA: Diagnosis not present

## 2022-10-06 DIAGNOSIS — N329 Bladder disorder, unspecified: Secondary | ICD-10-CM | POA: Diagnosis not present

## 2022-10-06 DIAGNOSIS — Z7901 Long term (current) use of anticoagulants: Secondary | ICD-10-CM | POA: Insufficient documentation

## 2022-10-06 LAB — CBC WITH DIFFERENTIAL (CANCER CENTER ONLY)
Abs Immature Granulocytes: 0.01 10*3/uL (ref 0.00–0.07)
Basophils Absolute: 0 10*3/uL (ref 0.0–0.1)
Basophils Relative: 0 %
Eosinophils Absolute: 0.1 10*3/uL (ref 0.0–0.5)
Eosinophils Relative: 2 %
HCT: 38.9 % (ref 36.0–46.0)
Hemoglobin: 12.2 g/dL (ref 12.0–15.0)
Immature Granulocytes: 0 %
Lymphocytes Relative: 22 %
Lymphs Abs: 1.4 10*3/uL (ref 0.7–4.0)
MCH: 27.1 pg (ref 26.0–34.0)
MCHC: 31.4 g/dL (ref 30.0–36.0)
MCV: 86.4 fL (ref 80.0–100.0)
Monocytes Absolute: 0.4 10*3/uL (ref 0.1–1.0)
Monocytes Relative: 7 %
Neutro Abs: 4.3 10*3/uL (ref 1.7–7.7)
Neutrophils Relative %: 69 %
Platelet Count: 279 10*3/uL (ref 150–400)
RBC: 4.5 MIL/uL (ref 3.87–5.11)
RDW: 18.7 % — ABNORMAL HIGH (ref 11.5–15.5)
WBC Count: 6.3 10*3/uL (ref 4.0–10.5)
nRBC: 0 % (ref 0.0–0.2)

## 2022-10-06 LAB — CMP (CANCER CENTER ONLY)
ALT: 24 U/L (ref 0–44)
AST: 38 U/L (ref 15–41)
Albumin: 4.3 g/dL (ref 3.5–5.0)
Alkaline Phosphatase: 62 U/L (ref 38–126)
Anion gap: 10 (ref 5–15)
BUN: 28 mg/dL — ABNORMAL HIGH (ref 8–23)
CO2: 24 mmol/L (ref 22–32)
Calcium: 8.3 mg/dL — ABNORMAL LOW (ref 8.9–10.3)
Chloride: 104 mmol/L (ref 98–111)
Creatinine: 0.92 mg/dL (ref 0.44–1.00)
GFR, Estimated: >60 mL/min (ref >60)
Glucose, Bld: 106 mg/dL — ABNORMAL HIGH (ref 70–99)
Potassium: 3.4 mmol/L — ABNORMAL LOW (ref 3.5–5.1)
Sodium: 138 mmol/L (ref 135–145)
Total Bilirubin: 0.1 mg/dL — ABNORMAL LOW (ref 0.3–1.2)
Total Protein: 7.7 g/dL (ref 6.5–8.1)

## 2022-10-06 MED ORDER — SODIUM CHLORIDE 0.9 % IV SOLN
200.0000 mg | Freq: Once | INTRAVENOUS | Status: AC
  Administered 2022-10-06: 200 mg via INTRAVENOUS
  Filled 2022-10-06: qty 8

## 2022-10-06 MED ORDER — SODIUM CHLORIDE 0.9 % IV SOLN
Freq: Once | INTRAVENOUS | Status: AC
  Filled 2022-10-06: qty 250

## 2022-10-06 NOTE — Patient Instructions (Signed)
[facility]  Discharge Instructions: Thank you for choosing [facility] to provide your oncology and hematology care.  If you have a lab appointment with the Cancer Center, please go directly to the Cancer Center and check in at the registration area.  Wear comfortable clothing and clothing appropriate for easy access to any Portacath or PICC line.   We strive to give you quality time with your provider. You may need to reschedule your appointment if you arrive late (15 or more minutes).  Arriving late affects you and other patients whose appointments are after yours.  Also, if you miss three or more appointments without notifying the office, you may be dismissed from the clinic at the provider's discretion.      For prescription refill requests, have your pharmacy contact our office and allow 72 hours for refills to be completed.    Today you received the following chemotherapy and/or immunotherapy agents- Keytruda      To help prevent nausea and vomiting after your treatment, we encourage you to take your nausea medication as directed.  BELOW ARE SYMPTOMS THAT SHOULD BE REPORTED IMMEDIATELY: *FEVER GREATER THAN 100.4 F (38 C) OR HIGHER *CHILLS OR SWEATING *NAUSEA AND VOMITING THAT IS NOT CONTROLLED WITH YOUR NAUSEA MEDICATION *UNUSUAL SHORTNESS OF BREATH *UNUSUAL BRUISING OR BLEEDING *URINARY PROBLEMS (pain or burning when urinating, or frequent urination) *BOWEL PROBLEMS (unusual diarrhea, constipation, pain near the anus) TENDERNESS IN MOUTH AND THROAT WITH OR WITHOUT PRESENCE OF ULCERS (sore throat, sores in mouth, or a toothache) UNUSUAL RASH, SWELLING OR PAIN  UNUSUAL VAGINAL DISCHARGE OR ITCHING   Items with * indicate a potential emergency and should be followed up as soon as possible or go to the Emergency Department if any problems should occur.  Please show the CHEMOTHERAPY ALERT CARD or IMMUNOTHERAPY ALERT CARD at check-in to  the Emergency Department and triage nurse.  Should you have questions after your visit or need to cancel or reschedule your appointment, please contact [facility] [place] [facility]  [phone] and follow the prompts.  Office hours are 8:00 a.m. to 4:30 p.m. Monday - Friday. Please note that voicemails left after 4:00 p.m. may not be returned until the following business day.  We are closed weekends and major holidays. You have access to a nurse at all times for urgent questions. Please call the main number to the clinic [phone] and follow the prompts.  For any non-urgent questions, you may also contact your provider using MyChart. We now offer e-Visits for anyone 18 and older to request care online for non-urgent symptoms. For details visit [email or web address].   Also download the MyChart app! Go to the app store, search "MyChart", open the app, select [facility], and log in with your MyChart username and password.   

## 2022-10-06 NOTE — Assessment & Plan Note (Addendum)
Non-small cell lung cancer, favor squamous cell carcinoma, stage IV- TPS 90%, TMB 10, KRAS G12V MRI brain with and without contrast -  Left occipital focus of prior hemorrhage with mild linear contrast enhancement. Possible small metastasis or microhemmorhagic event.  TPS 90%, Labs are reviewed and discussed with patient. She tolerates immunotherapy CT scan showed mixed response. Clinically she is doing better Recommend to continue current regimen.  Proceed with Timoteo Gaul lesion, repeat MRI in May 2024 shows no brain metastatic disease.

## 2022-10-06 NOTE — Assessment & Plan Note (Signed)
Immunotherapy treatment plan as listed above. 

## 2022-10-06 NOTE — Assessment & Plan Note (Addendum)
She has been off  Elqiuis 5mg  BID due to GI bleeding

## 2022-10-06 NOTE — Progress Notes (Addendum)
Hematology/Oncology Progress note [phone] [phone]        CHIEF COMPLAINTS/PURPOSE OF CONSULTATION:  Stage IV Lung squamous cell carcinoma  ASSESSMENT & PLAN:   Cancer Staging  Malignant neoplasm of lung (HCC) Staging form: Lung, AJCC 8th Edition - Clinical: Stage IVA (cT4, cN3, cM1a) - Signed by Rickard Patience, MD on 06/28/2022   Malignant neoplasm of lung (HCC) Non-small cell lung cancer, favor squamous cell carcinoma, stage IV- TPS 90%, TMB 10, KRAS G12V MRI brain with and without contrast -  Left occipital focus of prior hemorrhage with mild linear contrast enhancement. Possible small metastasis or microhemmorhagic event.  TPS 90%, Labs are reviewed and discussed with patient. She tolerates immunotherapy CT scan showed mixed response. Clinically she is doing better Recommend to continue current regimen.  Proceed with Timoteo Gaul lesion, repeat MRI in May 2024 shows no brain metastatic disease.    Acute pulmonary embolism (HCC) She has been off  Elqiuis 5mg  BID due to GI bleeding  Malnutrition (HCC) Follow up with nutritionist,, weight is stable.   Encounter for antineoplastic immunotherapy Immunotherapy treatment plan as listed above.  Normocytic anemia Chronic anemia.  Hemoglobin is improving.   Bladder mass Refer to urology    No orders of the defined types were placed in this encounter.  Follow up in 3 weeks.   All questions were answered. The patient knows to call the clinic with any problems, questions or concerns.  Rickard Patience, MD, PhD [facility] Hematology Oncology 10/06/2022       HISTORY OF PRESENTING ILLNESS:  Regina Perkins 82 y.o. female presents to establish care for Stage IV Lung squamous cell carcinoma I have reviewed her chart and materials related to her cancer extensively and collaborated history with the patient. Summary of oncologic history is as follows: Oncology History  Malignant neoplasm of lung (HCC)   05/24/2022 Imaging   CT chest angiogram showed 1. No evidence for pulmonary embolism. 2. Left upper lobe/prevascular mass worrisome for neoplasm. 3. Mediastinal and hilar lymphadenopathy. 4. Multiple pulmonary nodules measuring up to 11 mm worrisome for metastatic disease. 5. Small left pleural effusion. 6. Patchy ground-glass and airspace opacities in the left upper lobe worrisome for infection. 7. Moderate-sized pericardial effusion. 8. Cholelithiasis. 9. Nonobstructing left renal calculus   05/25/2022 Imaging   CT abdomen pelvis wo contrast 1. No acute findings or explanation for the patient's symptoms. 2. No evidence of primary malignancy or metastatic disease within the abdomen or pelvis on noncontrast imaging.  3. Cholelithiasis without evidence of cholecystitis or biliary dilatation. 4. Nonobstructing left renal calculus. 5. Moderate stool throughout the colon suggesting constipation. 6.  Aortic Atherosclerosis    05/26/2022 Initial Diagnosis   Malignant neoplasm of lung - TPS 90%, TMB 10, KRAS G12V  -05/22/2022 in the emergency room, she was found to have hypoxia with oxygen levels of 86% on room air, improved to 94 with 2 L of oxygen.  D-dimer was elevated at 1.29, troponin negative.  Lower extremity Doppler was negative for DVT.  VQ scan high probability PE (Large segmental perfusion defect of the left upper lobe without corresponding radiographic abnormality. Additional bilateral wedge-shaped subsegmental branch perfusion defects .  Patient was admitted and started on heparin Echocardiogram showed no RV strain.  Moderate pericardial effusion. Over her hospitalization, she continues to have shortness of breath with minimal exertion. 05/24/2022, CT chest angiogram showed left lung mass with bilateral lung nodules.   06/16/2022 Imaging   PET scan  1. Hypermetabolic  prevascular mass involves the mediastinum and medial left upper lobe with hypermetabolic lymph nodes extending to the  right supraclavicular station, bilateral pulmonary nodules and a hypermetabolic pleural nodule in the left hemithorax, findings indicative of stage IV primary bronchogenic carcinoma. No evidence of metastatic disease in the abdomen or pelvis. 2. Moderate pericardial effusion. 3. Tiny left pleural effusion. 4. Left renal stone. 5. Aortic atherosclerosis (ICD10-I70.0). Coronary artery calcification. 6. Enlarged pulmonic trunk, indicative of pulmonary arterial hypertension    06/21/2022 Procedure   US guided liver right supraclvicular node biopsy showed Metastatic poorly differentiated carcinoma, favor squamous cell carcinoma, probably pulmonary origin.    06/28/2022 Cancer Staging   Staging form: Lung, AJCC 8th Edition - Clinical: Stage IVA (cT4, cN3, cM1a) - Signed by Rickard Patience, MD on 06/28/2022   07/14/2022 -  Chemotherapy   Patient is on Treatment Plan : LUNG NSCLC Pembrolizumab (200) q21d       INTERVAL HISTORY Regina Perkins is a 82 y.o. female who has above history reviewed by me today presents for follow up visit for Lung squamous cell carcinoma She tolerates [place] well. Denies skin rash, diarrhea.  chronic SOB with exertion, improved she has been using her inhalers.  Patient reports feeling well.  No new complaints.  MEDICAL HISTORY:  Past Medical History:  Diagnosis Date   Chronic kidney disease, stage 3a (HCC)    Diabetes mellitus without complication (HCC)    HTN (hypertension)    Hypertension    Type II diabetes mellitus with renal manifestations (HCC)     SURGICAL HISTORY: Past Surgical History:  Procedure Laterality Date   ABDOMINAL HYSTERECTOMY     BACK SURGERY     1988 and 1998   ESOPHAGOGASTRODUODENOSCOPY (EGD) WITH PROPOFOL N/A 07/29/2022   Procedure: ESOPHAGOGASTRODUODENOSCOPY (EGD) WITH PROPOFOL;  Surgeon: Wyline Mood, MD;  Location: [facility] ENDOSCOPY;  Service: Gastroenterology;  Laterality: N/A;    SOCIAL HISTORY: Social History   Socioeconomic History    Marital status: Widowed    Spouse name: Not on file   Number of children: Not on file   Years of education: Not on file   Highest education level: Not on file  Occupational History   Not on file  Tobacco Use   Smoking status: Every Day    Packs/day: .5    Types: Cigarettes   Smokeless tobacco: Never  Vaping Use   Vaping Use: Never used  Substance and Sexual Activity   Alcohol use: No   Drug use: No   Sexual activity: Not on file  Other Topics Concern   Not on file  Social History Narrative   Not on file   Social Determinants of Health   Financial Resource Strain: Low Risk  (06/28/2022)   Overall Financial Resource Strain (CARDIA)    Difficulty of Paying Living Expenses: Not very hard  Food Insecurity: Food Insecurity Present (06/28/2022)   Hunger Vital Sign    Worried About Running Out of Food in the Last Year: Often true    Ran Out of Food in the Last Year: Often true  Transportation Needs: Unmet Transportation Needs (07/13/2022)   PRAPARE - Administrator, Civil Service (Medical): Yes    Lack of Transportation (Non-Medical): Yes  Physical Activity: Not on file  Stress: No Stress Concern Present (06/28/2022)   [employer] of Occupational Health - Occupational Stress Questionnaire    Feeling of Stress : Not at all  Social Connections: Not on file  Intimate Partner Violence: Not At  Risk (06/28/2022)   Humiliation, Afraid, Rape, and Kick questionnaire    Fear of Current or Ex-Partner: No    Emotionally Abused: No    Physically Abused: No    Sexually Abused: No    FAMILY HISTORY: Family History  Problem Relation Age of Onset   Asthma Mother    Heart disease Mother    Hypertension Mother    Diabetes Mother    Stroke Mother    Diabetes Sister    Diabetes Brother     ALLERGIES:  is allergic to ivp dye [iodinated contrast media] and shellfish allergy.  MEDICATIONS:  Current Outpatient Medications  Medication Sig Dispense Refill   albuterol (VENTOLIN  HFA) 108 (90 Base) MCG/ACT inhaler Inhale 2 puffs into the lungs every 4 (four) hours as needed for wheezing or shortness of breath.     amLODipine (NORVASC) 10 MG tablet Take 10 mg by mouth daily.     ascorbic acid (VITAMIN C) 500 MG tablet Take 500 mg by mouth daily.     benzonatate (TESSALON) 200 MG capsule Take 1 capsule (200 mg total) by mouth 3 (three) times daily as needed for cough. 20 capsule    BREZTRI AEROSPHERE 160-9-4.8 MCG/ACT AERO Inhale into the lungs.     cyanocobalamin (VITAMIN B12) 1000 MCG tablet Take 1 tablet (1,000 mcg total) by mouth daily. 90 tablet 0   fluticasone (FLONASE) 50 MCG/ACT nasal spray Place 1 spray into both nostrils daily.     Fluticasone-Umeclidin-Vilant (TRELEGY ELLIPTA) 100-62.5-25 MCG/ACT AEPB Inhale into the lungs.     furosemide (LASIX) 40 MG tablet Take 1 tablet (40 mg total) by mouth daily. 60 tablet 0   lisinopril (ZESTRIL) 40 MG tablet Take 40 mg by mouth daily.     metFORMIN (GLUCOPHAGE) 1000 MG tablet Take 1,000 mg by mouth 2 (two) times daily with a meal.     omeprazole (PRILOSEC) 40 MG capsule Take 1 capsule (40 mg total) by mouth in the morning and at bedtime. 60 capsule 2   prochlorperazine (COMPAZINE) 10 MG tablet Take 1 tablet (10 mg total) by mouth every 6 (six) hours as needed for nausea or vomiting. 30 tablet 1   No current facility-administered medications for this visit.    Review of Systems  Constitutional:  Negative for appetite change, chills, fatigue and fever.  HENT:   Negative for hearing loss and voice change.   Eyes:  Negative for eye problems.  Respiratory:  Positive for shortness of breath. Negative for chest tightness and cough.   Cardiovascular:  Negative for chest pain.  Gastrointestinal:  Negative for abdominal distention, abdominal pain and blood in stool.  Endocrine: Negative for hot flashes.  Genitourinary:  Negative for difficulty urinating and frequency.   Musculoskeletal:  Negative for arthralgias.  Skin:   Negative for itching and rash.  Neurological:  Negative for extremity weakness.  Hematological:  Negative for adenopathy.  Psychiatric/Behavioral:  Negative for confusion.      PHYSICAL EXAMINATION: ECOG PERFORMANCE STATUS: 2 - Symptomatic, <50% confined to bed  Vitals:   10/06/22 1009  BP: 111/61  Pulse: 75  Resp: 18  Temp: (!) 95.2 F (35.1 C)  SpO2: 100%   Filed Weights   10/06/22 1009  Weight: 115 lb 1.6 oz (52.2 kg)    Physical Exam Constitutional:      General: She is not in acute distress.    Appearance: She is not diaphoretic.  HENT:     Head: Normocephalic.  Eyes:  General: No scleral icterus.    Pupils: Pupils are equal, round, and reactive to light.  Cardiovascular:     Heart sounds: No murmur heard. Pulmonary:     Effort: Pulmonary effort is normal. No respiratory distress.     Comments: Decreased breath sounds bilaterally Abdominal:     General: There is no distension.  Musculoskeletal:        General: Normal range of motion.     Cervical back: Normal range of motion.  Skin:    Findings: No erythema.  Neurological:     Mental Status: She is alert and oriented to person, place, and time. Mental status is at baseline.     Cranial Nerves: No cranial nerve deficit.     Motor: No abnormal muscle tone.  Psychiatric:        Mood and Affect: Affect normal.      LABORATORY DATA:  I have reviewed the data as listed    Latest Ref Rng & Units 10/06/2022    9:52 AM 09/15/2022    1:40 PM 08/25/2022    8:42 AM  CBC  WBC 4.0 - 10.5 K/uL 6.3  8.5  7.4   Hemoglobin 12.0 - 15.0 g/dL 60.4  54.0  98.1   Hematocrit 36.0 - 46.0 % 38.9  35.7  34.3   Platelets 150 - 400 K/uL 279  284  287       Latest Ref Rng & Units 10/06/2022    9:52 AM 09/15/2022    1:40 PM 08/25/2022    8:42 AM  CMP  Glucose 70 - 99 mg/dL 191  478  295   BUN 8 - 23 mg/dL 28  38  27   Creatinine 0.44 - 1.00 mg/dL 6.21  3.08  6.57   Sodium 135 - 145 mmol/L 138  135  135   Potassium 3.5 -  5.1 mmol/L 3.4  4.2  3.6   Chloride 98 - 111 mmol/L 104  99  98   CO2 22 - 32 mmol/L 24  27  27    Calcium 8.9 - 10.3 mg/dL 8.3  9.2  9.3   Total Protein 6.5 - 8.1 g/dL 7.7  7.1  7.2   Total Bilirubin 0.3 - 1.2 mg/dL 0.1  0.3  0.5   Alkaline Phos 38 - 126 U/L 62  63  64   AST 15 - 41 U/L 38  27  31   ALT 0 - 44 U/L 24  24  21       RADIOGRAPHIC STUDIES: I have personally reviewed the radiological images as listed and agreed with the findings in the report. CT CHEST ABDOMEN PELVIS WO CONTRAST  Result Date: 10/06/2022 CLINICAL DATA:  Follow-up lung cancer EXAM: CT CHEST, ABDOMEN AND PELVIS WITHOUT CONTRAST TECHNIQUE: Multidetector CT imaging of the chest, abdomen and pelvis was performed following the standard protocol without IV contrast. RADIATION DOSE REDUCTION: This exam was performed according to the departmental dose-optimization program which includes automated exposure control, adjustment of the mA and/or kV according to patient size and/or use of iterative reconstruction technique. COMPARISON:  CT chest dated 06/17/2022.  PET-CT dated 06/15/2022. FINDINGS: CT CHEST FINDINGS Cardiovascular: Heart is top-normal in size. Trace pericardial fluid, improved. No evidence of thoracic aortic aneurysm. Atherosclerotic calcifications of the aortic arch. Mild coronary atherosclerosis of the LAD and right coronary artery. Mediastinum/Nodes: Prevascular/AP window nodal mass now measures 3.3 x 2.1 cm (series 2/image 16), previously 3.9 x 5.6 cm. Prior left supraclavicular lymphadenopathy is less conspicuous  and may have resolved. Visualized thyroid is unremarkable. Lungs/Pleura: Near complete resolution of the prior extension of the prevascular/superior mediastinal nodal mass into the medial left upper lobe. Bilateral pulmonary nodules, including: --13 x 10 mm mixed density nodule in the anterior left upper lobe (series 4/image 30), unchanged --2.5 x 2.1 cm mixed density nodule in the superior segment left  lower lobe (series 4/image 39), previously 2.1 x 1.9 cm --prior 5 mm solid nodule along the right minor fissure (series 3/image 87 of the prior study) has resolved --14 x 18 mm mixed density nodule in the anterior right lower lobe (series 4/image 63), previously 11 x 9 mm --near complete resolution of the prior 12 x 11 mm predominantly solid nodule in the central right lower lobe, with mild residual scarring (series 4/image 87) Additional scattered smaller mixed density nodules. Overall, there has been a waxing/waning appearance of some of these lesions. No focal consolidation. Mild centrilobular and paraseptal emphysematous changes, upper lung predominant. No pleural effusion or pneumothorax. Musculoskeletal: Degenerative changes of the thoracic spine. CT ABDOMEN PELVIS FINDINGS Hepatobiliary: Scattered probable hepatic cysts measuring up to 1.9 cm in the central left hepatic lobe (series 2/image 50). Layering small gallstone (series 2/image 60), without associated inflammatory changes. No intrahepatic or extrahepatic duct dilatation. Pancreas: Within normal limits. Spleen: Within normal limits. Adrenals/Urinary Tract: Adrenal glands are within normal limits. 3 mm nonobstructing left upper pole renal calculus (series 2/image 84). Right kidney is within normal limits. No hydronephrosis. Multifocal polypoid bladder mass, predominantly along the right posterior bladder wall, measuring at least 4.7 cm (series 2/image 97), compatible with primary bladder carcinoma. Stomach/Bowel: Stomach is within normal limits. No evidence of bowel obstruction. Normal appendix (series 2/image 82). No colonic wall thickening or inflammatory changes. Vascular/Lymphatic: No evidence of abdominal aortic aneurysm. Atherosclerotic calcifications of the abdominal aorta and branch vessels. No suspicious abdominopelvic lymphadenopathy. Reproductive: Status post hysterectomy. No adnexal masses. Other: No abdominopelvic ascites. Musculoskeletal:  Status post PLIF at L5-S1. Degenerative changes of the lumbar spine. IMPRESSION: Bilateral pulmonary nodules, some of which are waxing/waning, but favoring multifocal lung carcinoma/metastases. Improving thoracic nodal metastases, as above. 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 Consider cystoscopy as clinically warranted. Aortic Atherosclerosis (ICD10-I70.0) and Emphysema (ICD10-J43.9). Electronically Signed   By: Charline Bills M.D.   On: 10/06/2022 00:53   MR Brain W Wo Contrast  Result Date: 09/25/2022 CLINICAL DATA:  82 year old female with a history of lung cancer, abnormal left occipital lobe appearance on MRI in March where small hemorrhagic metastasis was a differential diagnosis. EXAM: MRI HEAD WITHOUT AND WITH CONTRAST TECHNIQUE: Multiplanar, multiecho pulse sequences of the brain and surrounding structures were obtained without and with intravenous contrast. CONTRAST:  5mL GADAVIST GADOBUTROL 1 MMOL/ML IV SOLN COMPARISON:  Brain MRI 07/07/2022. FINDINGS: Brain: Small area of hemosiderin in the left superior occipital lobe near the parieto-occipital sulcus appears more circumscribed on SWI series 11, image 44. No convincing residual enhancement there now (series 19, image 4) and almost completely resolved faint FLAIR hyperintensity (series 15, image 31). No edema or mass effect. No suspicious or abnormal intracranial enhancement now. No dural thickening identified. No intracranial mass effect. Multiple chronic lacunar infarcts in the bilateral deep gray matter nuclei are stable. Patchy and confluent bilateral cerebral white matter T2 and FLAIR hyperintensity with several chronic white matter lacunar infarcts (left centrum semiovale, corpus callosum). No cortical encephalomalacia identified. On SWI there is questionably a 2nd chronic microhemorrhage in the right frontal operculum on series 11, image 46, unchanged. No other  convincing chronic cerebral blood products. No restricted diffusion to suggest  acute infarction. No ventriculomegaly, extra-axial collection or acute intracranial hemorrhage. Cervicomedullary junction and pituitary are within normal limits. Vascular: Major intracranial vascular flow voids are preserved. There is mild generalized intracranial artery tortuosity. The major dural venous sinuses are enhancing and appear to be patent. Skull and upper cervical spine: Negative for age visible cervical spine. Visualized bone marrow signal is within normal limits. Sinuses/Orbits: Stable, negative. Other: Trace mastoid air cell fluid is stable. Grossly normal visible internal auditory structures. Negative visible scalp and face. IMPRESSION: 1. No metastatic disease or acute intracranial abnormality. Small area of hemosiderin in the left occipital lobe has mildly regressed since March, and the faint enhancement there has resolved. Suspect this is chronic small vessel disease related (see #2). 2. Fairly advanced chronic small vessel disease, including chronic lacunar infarcts in the cerebral white matter, bilateral deep gray nuclei. Electronically Signed   By: Odessa Fleming M.D.   On: 09/25/2022 07:36

## 2022-10-06 NOTE — Assessment & Plan Note (Signed)
Refer to urology.  ?

## 2022-10-06 NOTE — Assessment & Plan Note (Signed)
Follow up with nutritionist,, weight is stable.  

## 2022-10-06 NOTE — Assessment & Plan Note (Signed)
Chronic anemia.  Hemoglobin is improving.  

## 2022-10-06 NOTE — Progress Notes (Signed)
weNutrition Follow-up:  Patient with stage IV lung cancer.  Patient receiving [place].    Met with patient during infusion.  Reports that appetite is better. Recently woke up at 4am hungry and fixed breakfast (eggs, cheese, bacon, toast and coffee).  Usually eating 2 full meals a day. Drinking ensure complete or premier protein between meals sometimes 3 a day.     Medications: reviewed  Labs: glucose 106, BUN 28, creatinine  Anthropometrics:   Weight 115 lb 1.6 oz on 6/12  118 lb 1.6 oz on 5/1 116 lb 14.4 oz on 4/9 115 lb on 3/15   NUTRITION DIAGNOSIS: Inadequate oral intake stable    INTERVENTION:  Continue high calorie oral nutrition supplement Continue high calorie, high protein foods    MONITORING, EVALUATION, GOAL: weight trends, intake   NEXT VISIT: ~ 6 weeks with treatment  Mikaylee Arseneau B. Freida Busman, RD, LDN Registered Dietitian [phone]

## 2022-10-07 ENCOUNTER — Encounter: Payer: Self-pay | Admitting: Oncology

## 2022-10-08 ENCOUNTER — Telehealth: Payer: Self-pay

## 2022-10-08 DIAGNOSIS — R935 Abnormal findings on diagnostic imaging of other abdominal regions, including retroperitoneum: Secondary | ICD-10-CM

## 2022-10-08 NOTE — Telephone Encounter (Signed)
Called patient, no answer. Detailed VM left with MD recommendation.   Referral to Urology entered.

## 2022-10-08 NOTE — Telephone Encounter (Signed)
-----   Message from Rickard Patience, MD sent at 10/07/2022  5:01 PM EDT ----- My chart message sent.  I am not able to reach her to communicate the bladder findings. Please help to try again.  Refer to urologist if she agrees. Thanks.   zy

## 2022-10-27 ENCOUNTER — Encounter: Payer: Self-pay | Admitting: Oncology

## 2022-10-27 ENCOUNTER — Inpatient Hospital Stay: Payer: Medicare Other

## 2022-10-27 ENCOUNTER — Inpatient Hospital Stay (HOSPITAL_BASED_OUTPATIENT_CLINIC_OR_DEPARTMENT_OTHER): Payer: Medicare Other | Admitting: Oncology

## 2022-10-27 ENCOUNTER — Inpatient Hospital Stay: Payer: Medicare Other | Attending: Oncology

## 2022-10-27 VITALS — BP 127/61 | HR 92 | Temp 97.8°F | Resp 18 | Ht 63.0 in | Wt 115.9 lb

## 2022-10-27 DIAGNOSIS — F17210 Nicotine dependence, cigarettes, uncomplicated: Secondary | ICD-10-CM | POA: Diagnosis not present

## 2022-10-27 DIAGNOSIS — N3289 Other specified disorders of bladder: Secondary | ICD-10-CM

## 2022-10-27 DIAGNOSIS — C3412 Malignant neoplasm of upper lobe, left bronchus or lung: Secondary | ICD-10-CM | POA: Insufficient documentation

## 2022-10-27 DIAGNOSIS — D649 Anemia, unspecified: Secondary | ICD-10-CM | POA: Diagnosis not present

## 2022-10-27 DIAGNOSIS — Z5112 Encounter for antineoplastic immunotherapy: Secondary | ICD-10-CM | POA: Diagnosis present

## 2022-10-27 DIAGNOSIS — C3482 Malignant neoplasm of overlapping sites of left bronchus and lung: Secondary | ICD-10-CM

## 2022-10-27 DIAGNOSIS — Z86711 Personal history of pulmonary embolism: Secondary | ICD-10-CM | POA: Insufficient documentation

## 2022-10-27 DIAGNOSIS — Z7901 Long term (current) use of anticoagulants: Secondary | ICD-10-CM | POA: Diagnosis not present

## 2022-10-27 DIAGNOSIS — C787 Secondary malignant neoplasm of liver and intrahepatic bile duct: Secondary | ICD-10-CM | POA: Diagnosis present

## 2022-10-27 DIAGNOSIS — E43 Unspecified severe protein-calorie malnutrition: Secondary | ICD-10-CM | POA: Diagnosis not present

## 2022-10-27 DIAGNOSIS — Z79899 Other long term (current) drug therapy: Secondary | ICD-10-CM | POA: Diagnosis not present

## 2022-10-27 DIAGNOSIS — E46 Unspecified protein-calorie malnutrition: Secondary | ICD-10-CM | POA: Insufficient documentation

## 2022-10-27 DIAGNOSIS — C771 Secondary and unspecified malignant neoplasm of intrathoracic lymph nodes: Secondary | ICD-10-CM | POA: Diagnosis not present

## 2022-10-27 DIAGNOSIS — I2699 Other pulmonary embolism without acute cor pulmonale: Secondary | ICD-10-CM | POA: Diagnosis not present

## 2022-10-27 LAB — CBC WITH DIFFERENTIAL (CANCER CENTER ONLY)
Abs Immature Granulocytes: 0.02 10*3/uL (ref 0.00–0.07)
Basophils Absolute: 0 10*3/uL (ref 0.0–0.1)
Basophils Relative: 0 %
Eosinophils Absolute: 0.1 10*3/uL (ref 0.0–0.5)
Eosinophils Relative: 1 %
HCT: 36.9 % (ref 36.0–46.0)
Hemoglobin: 11.7 g/dL — ABNORMAL LOW (ref 12.0–15.0)
Immature Granulocytes: 0 %
Lymphocytes Relative: 18 %
Lymphs Abs: 1.5 10*3/uL (ref 0.7–4.0)
MCH: 27.7 pg (ref 26.0–34.0)
MCHC: 31.7 g/dL (ref 30.0–36.0)
MCV: 87.2 fL (ref 80.0–100.0)
Monocytes Absolute: 0.4 10*3/uL (ref 0.1–1.0)
Monocytes Relative: 5 %
Neutro Abs: 6.5 10*3/uL (ref 1.7–7.7)
Neutrophils Relative %: 76 %
Platelet Count: 287 10*3/uL (ref 150–400)
RBC: 4.23 MIL/uL (ref 3.87–5.11)
RDW: 17.8 % — ABNORMAL HIGH (ref 11.5–15.5)
WBC Count: 8.5 10*3/uL (ref 4.0–10.5)
nRBC: 0 % (ref 0.0–0.2)

## 2022-10-27 LAB — CMP (CANCER CENTER ONLY)
ALT: 25 U/L (ref 0–44)
AST: 30 U/L (ref 15–41)
Albumin: 3.9 g/dL (ref 3.5–5.0)
Alkaline Phosphatase: 60 U/L (ref 38–126)
Anion gap: 8 (ref 5–15)
BUN: 29 mg/dL — ABNORMAL HIGH (ref 8–23)
CO2: 25 mmol/L (ref 22–32)
Calcium: 9.1 mg/dL (ref 8.9–10.3)
Chloride: 98 mmol/L (ref 98–111)
Creatinine: 0.99 mg/dL (ref 0.44–1.00)
GFR, Estimated: 57 mL/min — ABNORMAL LOW (ref >60)
Glucose, Bld: 140 mg/dL — ABNORMAL HIGH (ref 70–99)
Potassium: 4 mmol/L (ref 3.5–5.1)
Sodium: 131 mmol/L — ABNORMAL LOW (ref 135–145)
Total Bilirubin: 0.5 mg/dL (ref 0.3–1.2)
Total Protein: 7.1 g/dL (ref 6.5–8.1)

## 2022-10-27 LAB — TSH: TSH: 1.239 u[IU]/mL (ref 0.350–4.500)

## 2022-10-27 MED ORDER — SODIUM CHLORIDE 0.9 % IV SOLN
Freq: Once | INTRAVENOUS | Status: AC
  Filled 2022-10-27: qty 250

## 2022-10-27 MED ORDER — SODIUM CHLORIDE 0.9 % IV SOLN
200.0000 mg | Freq: Once | INTRAVENOUS | Status: AC
  Administered 2022-10-27: 200 mg via INTRAVENOUS
  Filled 2022-10-27: qty 8

## 2022-10-27 NOTE — Assessment & Plan Note (Addendum)
She has been off  Elqiuis 5mg  BID due to GI bleeding

## 2022-10-27 NOTE — Assessment & Plan Note (Signed)
Refer to urology.  ?

## 2022-10-27 NOTE — Patient Instructions (Signed)
[facility]  Discharge Instructions: Thank you for choosing [facility] to provide your oncology and hematology care.  If you have a lab appointment with the Cancer Center, please go directly to the Cancer Center and check in at the registration area.  Wear comfortable clothing and clothing appropriate for easy access to any Portacath or PICC line.   We strive to give you quality time with your provider. You may need to reschedule your appointment if you arrive late (15 or more minutes).  Arriving late affects you and other patients whose appointments are after yours.  Also, if you miss three or more appointments without notifying the office, you may be dismissed from the clinic at the provider's discretion.      For prescription refill requests, have your pharmacy contact our office and allow 72 hours for refills to be completed.    Today you received the following chemotherapy and/or immunotherapy agents- Keytruda      To help prevent nausea and vomiting after your treatment, we encourage you to take your nausea medication as directed.  BELOW ARE SYMPTOMS THAT SHOULD BE REPORTED IMMEDIATELY: *FEVER GREATER THAN 100.4 F (38 C) OR HIGHER *CHILLS OR SWEATING *NAUSEA AND VOMITING THAT IS NOT CONTROLLED WITH YOUR NAUSEA MEDICATION *UNUSUAL SHORTNESS OF BREATH *UNUSUAL BRUISING OR BLEEDING *URINARY PROBLEMS (pain or burning when urinating, or frequent urination) *BOWEL PROBLEMS (unusual diarrhea, constipation, pain near the anus) TENDERNESS IN MOUTH AND THROAT WITH OR WITHOUT PRESENCE OF ULCERS (sore throat, sores in mouth, or a toothache) UNUSUAL RASH, SWELLING OR PAIN  UNUSUAL VAGINAL DISCHARGE OR ITCHING   Items with * indicate a potential emergency and should be followed up as soon as possible or go to the Emergency Department if any problems should occur.  Please show the CHEMOTHERAPY ALERT CARD or IMMUNOTHERAPY ALERT CARD at check-in to  the Emergency Department and triage nurse.  Should you have questions after your visit or need to cancel or reschedule your appointment, please contact [facility]  [phone] and follow the prompts.  Office hours are 8:00 a.m. to 4:30 p.m. Monday - Friday. Please note that voicemails left after 4:00 p.m. may not be returned until the following business day.  We are closed weekends and major holidays. You have access to a nurse at all times for urgent questions. Please call the main number to the clinic [phone] and follow the prompts.  For any non-urgent questions, you may also contact your provider using MyChart. We now offer e-Visits for anyone 18 and older to request care online for non-urgent symptoms. For details visit [email or web address].   Also download the MyChart app! Go to the app store, search "MyChart", open the app, select [facility], and log in with your MyChart username and password.   

## 2022-10-27 NOTE — Assessment & Plan Note (Signed)
Non-small cell lung cancer, favor squamous cell carcinoma, stage IV- TPS 90%, TMB 10, KRAS G12V MRI brain with and without contrast -  Left occipital focus of prior hemorrhage with mild linear contrast enhancement. Possible small metastasis or microhemmorhagic event.  TPS 90%, She tolerates immunotherapy CT scan showed mixed response. Clinically she is doing better Recommend to continue current regimen.  Labs are reviewed and discussed with patient.  Proceed with Keytruda  Brain lesion, repeat MRI in May 2024 shows no brain metastatic disease.

## 2022-10-27 NOTE — Assessment & Plan Note (Signed)
Follow up with nutritionist,, weight is stable.  

## 2022-10-27 NOTE — Progress Notes (Addendum)
Hematology/Oncology Progress note [phone] [phone]        CHIEF COMPLAINTS/PURPOSE OF CONSULTATION:  Stage IV Lung squamous cell carcinoma  ASSESSMENT & PLAN:   Cancer Staging  Malignant neoplasm of lung (HCC) Staging form: Lung, AJCC 8th Edition - Clinical: Stage IVA (cT4, cN3, cM1a) - Signed by Rickard Patience, MD on 06/28/2022   Bladder mass Refer to urology  Malignant neoplasm of lung (HCC) Non-small cell lung cancer, favor squamous cell carcinoma, stage IV- TPS 90%, TMB 10, KRAS G12V MRI brain with and without contrast -  Left occipital focus of prior hemorrhage with mild linear contrast enhancement. Possible small metastasis or microhemmorhagic event.  TPS 90%, She tolerates immunotherapy CT scan showed mixed response. Clinically she is doing better Recommend to continue current regimen.  Labs are reviewed and discussed with patient.  Proceed with Keytruda  Brain lesion, repeat MRI in May 2024 shows no brain metastatic disease.   Acute pulmonary embolism (HCC) She has been off  Elqiuis 5mg  BID due to GI bleeding  Malnutrition (HCC) Follow up with nutritionist,, weight is stable.   Encounter for antineoplastic immunotherapy Immunotherapy treatment plan as listed above.  Normocytic anemia Chronic anemia.  Hemoglobin is improving.   No orders of the defined types were placed in this encounter.  Follow up in 3 weeks.   All questions were answered. The patient knows to call the clinic with any problems, questions or concerns.  Rickard Patience, MD, PhD [facility] Hematology Oncology 10/27/2022       HISTORY OF PRESENTING ILLNESS:  Regina Perkins 82 y.o. female presents to establish care for Stage IV Lung squamous cell carcinoma I have reviewed her chart and materials related to her cancer extensively and collaborated history with the patient. Summary of oncologic history is as follows: Oncology History  Malignant neoplasm of lung (HCC)   05/24/2022 Imaging   CT chest angiogram showed 1. No evidence for pulmonary embolism. 2. Left upper lobe/prevascular mass worrisome for neoplasm. 3. Mediastinal and hilar lymphadenopathy. 4. Multiple pulmonary nodules measuring up to 11 mm worrisome for metastatic disease. 5. Small left pleural effusion. 6. Patchy ground-glass and airspace opacities in the left upper lobe worrisome for infection. 7. Moderate-sized pericardial effusion. 8. Cholelithiasis. 9. Nonobstructing left renal calculus   05/25/2022 Imaging   CT abdomen pelvis wo contrast 1. No acute findings or explanation for the patient's symptoms. 2. No evidence of primary malignancy or metastatic disease within the abdomen or pelvis on noncontrast imaging.  3. Cholelithiasis without evidence of cholecystitis or biliary dilatation. 4. Nonobstructing left renal calculus. 5. Moderate stool throughout the colon suggesting constipation. 6.  Aortic Atherosclerosis    05/26/2022 Initial Diagnosis   Malignant neoplasm of lung - TPS 90%, TMB 10, KRAS G12V  -05/22/2022 in the emergency room, she was found to have hypoxia with oxygen levels of 86% on room air, improved to 94 with 2 L of oxygen.  D-dimer was elevated at 1.29, troponin negative.  Lower extremity Doppler was negative for DVT.  VQ scan high probability PE (Large segmental perfusion defect of the left upper lobe without corresponding radiographic abnormality. Additional bilateral wedge-shaped subsegmental branch perfusion defects .  Patient was admitted and started on heparin Echocardiogram showed no RV strain.  Moderate pericardial effusion. Over her hospitalization, she continues to have shortness of breath with minimal exertion. 05/24/2022, CT chest angiogram showed left lung mass with bilateral lung nodules.   06/16/2022 Imaging   PET scan  1. Hypermetabolic prevascular mass  involves the mediastinum and medial left upper lobe with hypermetabolic lymph nodes extending to the  right supraclavicular station, bilateral pulmonary nodules and a hypermetabolic pleural nodule in the left hemithorax, findings indicative of stage IV primary bronchogenic carcinoma. No evidence of metastatic disease in the abdomen or pelvis. 2. Moderate pericardial effusion. 3. Tiny left pleural effusion. 4. Left renal stone. 5. Aortic atherosclerosis (ICD10-I70.0). Coronary artery calcification. 6. Enlarged pulmonic trunk, indicative of pulmonary arterial hypertension    06/21/2022 Procedure   US guided liver right supraclvicular node biopsy showed Metastatic poorly differentiated carcinoma, favor squamous cell carcinoma, probably pulmonary origin.    06/28/2022 Cancer Staging   Staging form: Lung, AJCC 8th Edition - Clinical: Stage IVA (cT4, cN3, cM1a) - Signed by Rickard Patience, MD on 06/28/2022   07/14/2022 -  Chemotherapy   Patient is on Treatment Plan : LUNG NSCLC Pembrolizumab (200) q21d     09/29/2022 Imaging   CT chest abdomen pelvis w contrast showed Bilateral pulmonary nodules, some of which are waxing/waning, but favoring multifocal lung carcinoma/metastases.   Improving thoracic nodal metastases, as above.  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 Consider cystoscopy as clinically warranted.  Aortic Atherosclerosis (ICD10-I70.0) and Emphysema (ICD10-J43.9)     INTERVAL HISTORY Regina Perkins is a 82 y.o. female who has above history reviewed by me today presents for follow up visit for Lung squamous cell carcinoma She tolerates [place] well. Denies skin rash, diarrhea.  chronic SOB with exertion, improved she has been using her inhalers.  Patient reports feeling well.  No new complaints.  MEDICAL HISTORY:  Past Medical History:  Diagnosis Date   Chronic kidney disease, stage 3a (HCC)    Diabetes mellitus without complication (HCC)    HTN (hypertension)    Hypertension    Type II diabetes mellitus with renal manifestations (HCC)     SURGICAL HISTORY: Past Surgical History:   Procedure Laterality Date   ABDOMINAL HYSTERECTOMY     BACK SURGERY     1988 and 1998   ESOPHAGOGASTRODUODENOSCOPY (EGD) WITH PROPOFOL N/A 07/29/2022   Procedure: ESOPHAGOGASTRODUODENOSCOPY (EGD) WITH PROPOFOL;  Surgeon: Wyline Mood, MD;  Location[facility] ENDOSCOPY;  Service: Gastroenterology;  Laterality: N/A;    SOCIAL HISTORY: Social History   Socioeconomic History   Marital status: Widowed    Spouse name: Not on file   Number of children: Not on file   Years of education: Not on file   Highest education level: Not on file  Occupational History   Not on file  Tobacco Use   Smoking status: Every Day    Packs/day: .5    Types: Cigarettes   Smokeless tobacco: Never  Vaping Use   Vaping Use: Never used  Substance and Sexual Activity   Alcohol use: No   Drug use: No   Sexual activity: Not on file  Other Topics Concern   Not on file  Social History Narrative   Not on file   Social Determinants of Health   Financial Resource Strain: Low Risk  (06/28/2022)   Overall Financial Resource Strain (CARDIA)    Difficulty of Paying Living Expenses: Not very hard  Food Insecurity: Food Insecurity Present (06/28/2022)   Hunger Vital Sign    Worried About Running Out of Food in the Last Year: Often true    Ran Out of Food in the Last Year: Often true  Transportation Needs: Unmet Transportation Needs (07/13/2022)   PRAPARE - Administrator, Civil Service (Medical): Yes    Lack of  Transportation (Non-Medical): Yes  Physical Activity: Not on file  Stress: No Stress Concern Present (06/28/2022)   [employer] of Occupational Health - Occupational Stress Questionnaire    Feeling of Stress : Not at all  Social Connections: Not on file  Intimate Partner Violence: Not At Risk (06/28/2022)   Humiliation, Afraid, Rape, and Kick questionnaire    Fear of Current or Ex-Partner: No    Emotionally Abused: No    Physically Abused: No    Sexually Abused: No    FAMILY HISTORY: Family  History  Problem Relation Age of Onset   Asthma Mother    Heart disease Mother    Hypertension Mother    Diabetes Mother    Stroke Mother    Diabetes Sister    Diabetes Brother     ALLERGIES:  is allergic to ivp dye [iodinated contrast media] and shellfish allergy.  MEDICATIONS:  Current Outpatient Medications  Medication Sig Dispense Refill   albuterol (VENTOLIN HFA) 108 (90 Base) MCG/ACT inhaler Inhale 2 puffs into the lungs every 4 (four) hours as needed for wheezing or shortness of breath.     amLODipine (NORVASC) 10 MG tablet Take 10 mg by mouth daily.     ascorbic acid (VITAMIN C) 500 MG tablet Take 500 mg by mouth daily.     benzonatate (TESSALON) 200 MG capsule Take 1 capsule (200 mg total) by mouth 3 (three) times daily as needed for cough. 20 capsule    BREZTRI AEROSPHERE 160-9-4.8 MCG/ACT AERO Inhale into the lungs.     cyanocobalamin (VITAMIN B12) 1000 MCG tablet Take 1 tablet (1,000 mcg total) by mouth daily. 90 tablet 0   fluticasone (FLONASE) 50 MCG/ACT nasal spray Place 1 spray into both nostrils daily.     Fluticasone-Umeclidin-Vilant (TRELEGY ELLIPTA) 100-62.5-25 MCG/ACT AEPB Inhale into the lungs.     furosemide (LASIX) 40 MG tablet Take 1 tablet (40 mg total) by mouth daily. 60 tablet 0   lisinopril (ZESTRIL) 40 MG tablet Take 40 mg by mouth daily.     metFORMIN (GLUCOPHAGE) 1000 MG tablet Take 1,000 mg by mouth 2 (two) times daily with a meal.     omeprazole (PRILOSEC) 40 MG capsule Take 1 capsule (40 mg total) by mouth in the morning and at bedtime. 60 capsule 2   prochlorperazine (COMPAZINE) 10 MG tablet Take 1 tablet (10 mg total) by mouth every 6 (six) hours as needed for nausea or vomiting. 30 tablet 1   No current facility-administered medications for this visit.    Review of Systems  Constitutional:  Negative for appetite change, chills, fatigue and fever.  HENT:   Negative for hearing loss and voice change.   Eyes:  Negative for eye problems.   Respiratory:  Positive for shortness of breath. Negative for chest tightness and cough.   Cardiovascular:  Negative for chest pain.  Gastrointestinal:  Negative for abdominal distention, abdominal pain and blood in stool.  Endocrine: Negative for hot flashes.  Genitourinary:  Negative for difficulty urinating and frequency.   Musculoskeletal:  Negative for arthralgias.  Skin:  Negative for itching and rash.  Neurological:  Negative for extremity weakness.  Hematological:  Negative for adenopathy.  Psychiatric/Behavioral:  Negative for confusion.      PHYSICAL EXAMINATION: ECOG PERFORMANCE STATUS: 2 - Symptomatic, <50% confined to bed  Vitals:   10/27/22 0955  BP: 127/61  Pulse: 92  Resp: 18  Temp: 97.8 F (36.6 C)  SpO2: 98%   Filed Weights   10/27/22  8469  Weight: 115 lb 14.4 oz (52.6 kg)    Physical Exam Constitutional:      General: She is not in acute distress.    Appearance: She is not diaphoretic.  HENT:     Head: Normocephalic.  Eyes:     General: No scleral icterus.    Pupils: Pupils are equal, round, and reactive to light.  Cardiovascular:     Heart sounds: No murmur heard. Pulmonary:     Effort: Pulmonary effort is normal. No respiratory distress.     Comments: Decreased breath sounds bilaterally Abdominal:     General: There is no distension.  Musculoskeletal:        General: Normal range of motion.     Cervical back: Normal range of motion.  Skin:    Findings: No erythema.  Neurological:     Mental Status: She is alert and oriented to person, place, and time. Mental status is at baseline.     Cranial Nerves: No cranial nerve deficit.     Motor: No abnormal muscle tone.  Psychiatric:        Mood and Affect: Affect normal.      LABORATORY DATA:  I have reviewed the data as listed    Latest Ref Rng & Units 10/27/2022   10:00 AM 10/06/2022    9:52 AM 09/15/2022    1:40 PM  CBC  WBC 4.0 - 10.5 K/uL 8.5  6.3  8.5   Hemoglobin 12.0 - 15.0 g/dL  62.9  52.8  41.3   Hematocrit 36.0 - 46.0 % 36.9  38.9  35.7   Platelets 150 - 400 K/uL 287  279  284       Latest Ref Rng & Units 10/27/2022   10:00 AM 10/06/2022    9:52 AM 09/15/2022    1:40 PM  CMP  Glucose 70 - 99 mg/dL 244  010  272   BUN 8 - 23 mg/dL 29  28  38   Creatinine 0.44 - 1.00 mg/dL 5.36  6.44  0.34   Sodium 135 - 145 mmol/L 131  138  135   Potassium 3.5 - 5.1 mmol/L 4.0  3.4  4.2   Chloride 98 - 111 mmol/L 98  104  99   CO2 22 - 32 mmol/L 25  24  27    Calcium 8.9 - 10.3 mg/dL 9.1  8.3  9.2   Total Protein 6.5 - 8.1 g/dL 7.1  7.7  7.1   Total Bilirubin 0.3 - 1.2 mg/dL 0.5  0.1  0.3   Alkaline Phos 38 - 126 U/L 60  62  63   AST 15 - 41 U/L 30  38  27   ALT 0 - 44 U/L 25  24  24       RADIOGRAPHIC STUDIES: I have personally reviewed the radiological images as listed and agreed with the findings in the report. CT CHEST ABDOMEN PELVIS WO CONTRAST  Result Date: 10/06/2022 CLINICAL DATA:  Follow-up lung cancer EXAM: CT CHEST, ABDOMEN AND PELVIS WITHOUT CONTRAST TECHNIQUE: Multidetector CT imaging of the chest, abdomen and pelvis was performed following the standard protocol without IV contrast. RADIATION DOSE REDUCTION: This exam was performed according to the departmental dose-optimization program which includes automated exposure control, adjustment of the mA and/or kV according to patient size and/or use of iterative reconstruction technique. COMPARISON:  CT chest dated 06/17/2022.  PET-CT dated 06/15/2022. FINDINGS: CT CHEST FINDINGS Cardiovascular: Heart is top-normal in size. Trace pericardial fluid, improved. No  evidence of thoracic aortic aneurysm. Atherosclerotic calcifications of the aortic arch. Mild coronary atherosclerosis of the LAD and right coronary artery. Mediastinum/Nodes: Prevascular/AP window nodal mass now measures 3.3 x 2.1 cm (series 2/image 16), previously 3.9 x 5.6 cm. Prior left supraclavicular lymphadenopathy is less conspicuous and may have resolved.  Visualized thyroid is unremarkable. Lungs/Pleura: Near complete resolution of the prior extension of the prevascular/superior mediastinal nodal mass into the medial left upper lobe. Bilateral pulmonary nodules, including: --13 x 10 mm mixed density nodule in the anterior left upper lobe (series 4/image 30), unchanged --2.5 x 2.1 cm mixed density nodule in the superior segment left lower lobe (series 4/image 39), previously 2.1 x 1.9 cm --prior 5 mm solid nodule along the right minor fissure (series 3/image 87 of the prior study) has resolved --14 x 18 mm mixed density nodule in the anterior right lower lobe (series 4/image 63), previously 11 x 9 mm --near complete resolution of the prior 12 x 11 mm predominantly solid nodule in the central right lower lobe, with mild residual scarring (series 4/image 87) Additional scattered smaller mixed density nodules. Overall, there has been a waxing/waning appearance of some of these lesions. No focal consolidation. Mild centrilobular and paraseptal emphysematous changes, upper lung predominant. No pleural effusion or pneumothorax. Musculoskeletal: Degenerative changes of the thoracic spine. CT ABDOMEN PELVIS FINDINGS Hepatobiliary: Scattered probable hepatic cysts measuring up to 1.9 cm in the central left hepatic lobe (series 2/image 50). Layering small gallstone (series 2/image 60), without associated inflammatory changes. No intrahepatic or extrahepatic duct dilatation. Pancreas: Within normal limits. Spleen: Within normal limits. Adrenals/Urinary Tract: Adrenal glands are within normal limits. 3 mm nonobstructing left upper pole renal calculus (series 2/image 84). Right kidney is within normal limits. No hydronephrosis. Multifocal polypoid bladder mass, predominantly along the right posterior bladder wall, measuring at least 4.7 cm (series 2/image 97), compatible with primary bladder carcinoma. Stomach/Bowel: Stomach is within normal limits. No evidence of bowel  obstruction. Normal appendix (series 2/image 82). No colonic wall thickening or inflammatory changes. Vascular/Lymphatic: No evidence of abdominal aortic aneurysm. Atherosclerotic calcifications of the abdominal aorta and branch vessels. No suspicious abdominopelvic lymphadenopathy. Reproductive: Status post hysterectomy. No adnexal masses. Other: No abdominopelvic ascites. Musculoskeletal: Status post PLIF at L5-S1. Degenerative changes of the lumbar spine. IMPRESSION: Bilateral pulmonary nodules, some of which are waxing/waning, but favoring multifocal lung carcinoma/metastases. Improving thoracic nodal metastases, as above. 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 Consider cystoscopy as clinically warranted. Aortic Atherosclerosis (ICD10-I70.0) and Emphysema (ICD10-J43.9). Electronically Signed   By: Charline Bills M.D.   On: 10/06/2022 00:53

## 2022-10-27 NOTE — Assessment & Plan Note (Signed)
Immunotherapy treatment plan as listed above. 

## 2022-10-27 NOTE — Assessment & Plan Note (Signed)
Chronic anemia.  Hemoglobin is improving.  

## 2022-10-29 LAB — T4: T4, Total: 8.4 ug/dL (ref 4.5–12.0)

## 2022-11-11 ENCOUNTER — Encounter: Payer: Self-pay | Admitting: Gastroenterology

## 2022-11-11 ENCOUNTER — Other Ambulatory Visit: Payer: Self-pay

## 2022-11-11 ENCOUNTER — Encounter: Payer: Self-pay | Admitting: Oncology

## 2022-11-11 ENCOUNTER — Ambulatory Visit: Payer: Medicare Other | Admitting: Gastroenterology

## 2022-11-11 VITALS — BP 119/60 | HR 70 | Temp 98.1°F | Wt 112.0 lb

## 2022-11-11 DIAGNOSIS — Z8719 Personal history of other diseases of the digestive system: Secondary | ICD-10-CM

## 2022-11-11 MED ORDER — NA SULFATE-K SULFATE-MG SULF 17.5-3.13-1.6 GM/177ML PO SOLN: 354.0000 mL | Freq: Once | ORAL | 0 refills | Status: DC

## 2022-11-11 NOTE — Progress Notes (Signed)
Wyline Mood MD, MRCP(U.K) [address]  [address]  [place], [place] [address]  Main: [phone]  Fax: [phone]   Primary Care Physician: [facility]  Primary Gastroenterologist:  Dr. Wyline Mood   Chief Complaint  Patient presents with   gastrointestinal hemorrhage    HPI: Regina Perkins is a 82 y.o. female   Summary of history :  Initially referred and seen while admitted to the hospital in April 2024 for a GI bleed.  History of stage IV lung cancer CKD presented with an acute PE on Eliquis with hematemesis and melena.  On admission hemoglobin was 7.7 g 7 days prior to that was 10.6 g.  There is elevation in the BUN/creatinine ratio.  There is 1 episode of bloody vomitus and a few episodes of tarry black stools.  I performed an EGD on 07/29/2022 and 1 nonbleeding cratered duodenal ulcer with clean base was found in the first portion of the duodenum 20 mm in size.  Plan is to treat with Prilosec and repeat EGD in 3 months  10/27/2022 hemoglobin 11.7 g seen by Dr. Cathie Hoops for bladder mass on Eliquis 5 mg twice daily for pulmonary embolism  Interval history      Doing well , brown stools, no bleeding . No complaints   Current Outpatient Medications  Medication Sig Dispense Refill   albuterol (VENTOLIN HFA) 108 (90 Base) MCG/ACT inhaler Inhale 2 puffs into the lungs every 4 (four) hours as needed for wheezing or shortness of breath.     amLODipine (NORVASC) 10 MG tablet Take 10 mg by mouth daily.     ascorbic acid (VITAMIN C) 500 MG tablet Take 500 mg by mouth daily.     benzonatate (TESSALON) 200 MG capsule Take 1 capsule (200 mg total) by mouth 3 (three) times daily as needed for cough. 20 capsule    BREZTRI AEROSPHERE 160-9-4.8 MCG/ACT AERO Inhale into the lungs.     cyanocobalamin (VITAMIN B12) 1000 MCG tablet Take 1 tablet (1,000 mcg total) by mouth daily. 90 tablet 0   fluticasone (FLONASE) 50 MCG/ACT nasal spray Place 1 spray into both  nostrils daily.     Fluticasone-Umeclidin-Vilant (TRELEGY ELLIPTA) 100-62.5-25 MCG/ACT AEPB Inhale into the lungs.     furosemide (LASIX) 40 MG tablet Take 1 tablet (40 mg total) by mouth daily. 60 tablet 0   lisinopril (ZESTRIL) 40 MG tablet Take 40 mg by mouth daily.     metFORMIN (GLUCOPHAGE) 1000 MG tablet Take 1,000 mg by mouth 2 (two) times daily with a meal.     omeprazole (PRILOSEC) 40 MG capsule Take 1 capsule (40 mg total) by mouth in the morning and at bedtime. 60 capsule 2   prochlorperazine (COMPAZINE) 10 MG tablet Take 1 tablet (10 mg total) by mouth every 6 (six) hours as needed for nausea or vomiting. 30 tablet 1   No current facility-administered medications for this visit.    Allergies as of 11/11/2022 - Review Complete 11/11/2022  Allergen Reaction Noted   Ivp dye [iodinated contrast media]  06/08/2015   Shellfish allergy Other (See Comments) 12/30/2015      ROS:  General: Negative for anorexia, weight loss, fever, chills, fatigue, weakness. ENT: Negative for hoarseness, difficulty swallowing , nasal congestion. CV: Negative for chest pain, angina, palpitations, dyspnea on exertion, peripheral edema.  Respiratory: Negative for dyspnea at rest, dyspnea on exertion, cough, sputum, wheezing.  GI: See history of present illness. GU:  Negative for dysuria,  hematuria, urinary incontinence, urinary frequency, nocturnal urination.  Endo: Negative for unusual weight change.    Physical Examination:   BP (!) 147/81   Pulse (!) 118   Temp 98.1 F (36.7 C) (Oral)   Wt 112 lb (50.8 kg)   BMI 19.84 kg/m   General: Well-nourished, well-developed in no acute distress.  Eyes: No icterus. Conjunctivae pink. Neuro: Alert and oriented x 3.  Grossly intact. Skin: Warm and dry, no jaundice.   Psych: Alert and cooperative, normal mood and affect.   Imaging Studies: No results found.  Assessment and Plan:   Regina Perkins is a 82 y.o. y/o female here to follow-up as a  hospital follow-up.  Was admitted to the hospital and found to have a large duodenal ulcer with stigmata of recent bleeding.  Treated with PPI and here as a follow-up.  Plan 1.  Continue PPI- decrease dose to 40 mg onbce a day  2.  EGD to evaluate for ulcer to ensure it is healed 3.  Avoid NSAIDs    I have discussed alternative options, risks & benefits,  which include, but are not limited to, bleeding, infection, perforation,respiratory complication & drug reaction.  The patient agrees with this plan & written consent will be obtained.     Dr Wyline Mood  MD,MRCP [facility]) Follow up in as needed

## 2022-11-11 NOTE — Patient Instructions (Signed)
Please reduce your Omeprazole 40 MG to once a day.

## 2022-11-17 ENCOUNTER — Ambulatory Visit: Payer: Medicare Other | Admitting: Oncology

## 2022-11-17 ENCOUNTER — Inpatient Hospital Stay (HOSPITAL_BASED_OUTPATIENT_CLINIC_OR_DEPARTMENT_OTHER): Payer: Medicare Other | Admitting: Oncology

## 2022-11-17 ENCOUNTER — Encounter: Payer: Self-pay | Admitting: Oncology

## 2022-11-17 ENCOUNTER — Ambulatory Visit: Payer: Medicare Other

## 2022-11-17 ENCOUNTER — Ambulatory Visit: Payer: Medicare Other | Admitting: Urology

## 2022-11-17 ENCOUNTER — Inpatient Hospital Stay: Payer: Medicare Other

## 2022-11-17 ENCOUNTER — Encounter: Payer: Self-pay | Admitting: Urology

## 2022-11-17 ENCOUNTER — Other Ambulatory Visit: Payer: Medicare Other

## 2022-11-17 VITALS — BP 104/62 | HR 114 | Ht 66.0 in | Wt 112.0 lb

## 2022-11-17 VITALS — BP 133/89 | HR 98 | Temp 97.6°F | Resp 18 | Wt 115.6 lb

## 2022-11-17 DIAGNOSIS — N3289 Other specified disorders of bladder: Secondary | ICD-10-CM

## 2022-11-17 DIAGNOSIS — Z5112 Encounter for antineoplastic immunotherapy: Secondary | ICD-10-CM

## 2022-11-17 DIAGNOSIS — C3482 Malignant neoplasm of overlapping sites of left bronchus and lung: Secondary | ICD-10-CM

## 2022-11-17 DIAGNOSIS — D649 Anemia, unspecified: Secondary | ICD-10-CM | POA: Diagnosis not present

## 2022-11-17 DIAGNOSIS — I2699 Other pulmonary embolism without acute cor pulmonale: Secondary | ICD-10-CM | POA: Diagnosis not present

## 2022-11-17 DIAGNOSIS — Z720 Tobacco use: Secondary | ICD-10-CM

## 2022-11-17 LAB — CBC WITH DIFFERENTIAL (CANCER CENTER ONLY)
Abs Immature Granulocytes: 0.02 10*3/uL (ref 0.00–0.07)
Basophils Absolute: 0 10*3/uL (ref 0.0–0.1)
Basophils Relative: 0 %
Eosinophils Absolute: 0.1 10*3/uL (ref 0.0–0.5)
Eosinophils Relative: 1 %
HCT: 35.5 % — ABNORMAL LOW (ref 36.0–46.0)
Hemoglobin: 11.2 g/dL — ABNORMAL LOW (ref 12.0–15.0)
Immature Granulocytes: 0 %
Lymphocytes Relative: 22 %
Lymphs Abs: 1.8 10*3/uL (ref 0.7–4.0)
MCH: 27.9 pg (ref 26.0–34.0)
MCHC: 31.5 g/dL (ref 30.0–36.0)
MCV: 88.5 fL (ref 80.0–100.0)
Monocytes Absolute: 0.4 10*3/uL (ref 0.1–1.0)
Monocytes Relative: 5 %
Neutro Abs: 6 10*3/uL (ref 1.7–7.7)
Neutrophils Relative %: 72 %
Platelet Count: 244 10*3/uL (ref 150–400)
RBC: 4.01 MIL/uL (ref 3.87–5.11)
RDW: 17.6 % — ABNORMAL HIGH (ref 11.5–15.5)
WBC Count: 8.4 10*3/uL (ref 4.0–10.5)
nRBC: 0 % (ref 0.0–0.2)

## 2022-11-17 LAB — MICROSCOPIC EXAMINATION
RBC, Urine: >30 /hpf — AB (ref 0–2)
WBC, UA: >30 /hpf — AB (ref 0–5)

## 2022-11-17 LAB — URINALYSIS, COMPLETE
Bilirubin, UA: NEGATIVE
Glucose, UA: NEGATIVE
Nitrite, UA: NEGATIVE
Specific Gravity, UA: 1.02 (ref 1.005–1.030)
Urobilinogen, Ur: 0.2 mg/dL (ref 0.2–1.0)
pH, UA: 5.5 (ref 5.0–7.5)

## 2022-11-17 LAB — CMP (CANCER CENTER ONLY)
ALT: 21 U/L (ref 0–44)
AST: 26 U/L (ref 15–41)
Albumin: 3.7 g/dL (ref 3.5–5.0)
Alkaline Phosphatase: 58 U/L (ref 38–126)
Anion gap: 7 (ref 5–15)
BUN: 32 mg/dL — ABNORMAL HIGH (ref 8–23)
CO2: 27 mmol/L (ref 22–32)
Calcium: 9 mg/dL (ref 8.9–10.3)
Chloride: 101 mmol/L (ref 98–111)
Creatinine: 1.05 mg/dL — ABNORMAL HIGH (ref 0.44–1.00)
GFR, Estimated: 53 mL/min — ABNORMAL LOW (ref >60)
Glucose, Bld: 176 mg/dL — ABNORMAL HIGH (ref 70–99)
Potassium: 4.5 mmol/L (ref 3.5–5.1)
Sodium: 135 mmol/L (ref 135–145)
Total Bilirubin: 0.2 mg/dL — ABNORMAL LOW (ref 0.3–1.2)
Total Protein: 6.8 g/dL (ref 6.5–8.1)

## 2022-11-17 MED ORDER — SODIUM CHLORIDE 0.9 % IV SOLN
Freq: Once | INTRAVENOUS | Status: AC
  Filled 2022-11-17: qty 250

## 2022-11-17 MED ORDER — SODIUM CHLORIDE 0.9 % IV SOLN
200.0000 mg | Freq: Once | INTRAVENOUS | Status: AC
  Administered 2022-11-17: 200 mg via INTRAVENOUS
  Filled 2022-11-17: qty 200

## 2022-11-17 NOTE — Progress Notes (Signed)
I, Duke Salvia, acting as a Neurosurgeon for [name], MD., have documented all relevant documentation on the behalf of [name], MD, as directed by  [name], MD while in the presence of [name], MD.   11/17/2022 12:12 PM   [name] June 26, 1940 [id number]  Referring provider: Rickard Patience, MD [address] [place],  [place] [address]  Chief Complaint  Patient presents with   Other    HPI: Regina Perkins is a 82 y.o. female, referred for evaluation of bladder mass seen on CT.   Followed by Dr. Cathie Hoops in medical oncology for non-small cell carcinoma of the lung. CT chest/abdomen/pelvis performed 09/29/2022 for lung cancer. Follow up incidentally showed multifocal bladder masses measuring up to 4/7 cm. This finding was not present on a prior CT abdomen/pelvis 05/25/22. She does have some urinary frequency and urgency. Denies gross hematuria. Has had weight loss.   PMH: Past Medical History:  Diagnosis Date   Chronic kidney disease, stage 3a (HCC)    Diabetes mellitus without complication (HCC)    HTN (hypertension)    Hypertension    Type II diabetes mellitus with renal manifestations [facility])     Surgical History: Past Surgical History:  Procedure Laterality Date   ABDOMINAL HYSTERECTOMY     BACK SURGERY     1988 and 1998   ESOPHAGOGASTRODUODENOSCOPY (EGD) WITH PROPOFOL N/A 07/29/2022   Procedure: ESOPHAGOGASTRODUODENOSCOPY (EGD) WITH PROPOFOL;  Surgeon: Wyline Mood, MD;  Location: [facility] ENDOSCOPY;  Service: Gastroenterology;  Laterality: N/A;    Home Medications:  Allergies as of 11/17/2022       Reactions   Ivp Dye [iodinated Contrast Media]    Shellfish Allergy Other (See Comments)   Reaction: unknown         Medication List        Accurate as of November 17, 2022 12:12 PM. If you have any questions, ask your nurse or doctor.          albuterol 108 (90 Base) MCG/ACT inhaler Commonly known as: VENTOLIN HFA Inhale 2 puffs into the  lungs every 4 (four) hours as needed for wheezing or shortness of breath.   amLODipine 10 MG tablet Commonly known as: NORVASC Take 10 mg by mouth daily.   ascorbic acid 500 MG tablet Commonly known as: VITAMIN C Take 500 mg by mouth daily.   benzonatate 200 MG capsule Commonly known as: TESSALON Take 1 capsule (200 mg total) by mouth 3 (three) times daily as needed for cough.   Breztri Aerosphere 160-9-4.8 MCG/ACT Aero Generic drug: Budeson-Glycopyrrol-Formoterol Inhale into the lungs.   cyanocobalamin 1000 MCG tablet Commonly known as: VITAMIN B12 Take 1 tablet (1,000 mcg total) by mouth daily.   fluticasone 50 MCG/ACT nasal spray Commonly known as: FLONASE Place 1 spray into both nostrils daily.   furosemide 40 MG tablet Commonly known as: Lasix Take 1 tablet (40 mg total) by mouth daily.   lisinopril 40 MG tablet Commonly known as: ZESTRIL Take 40 mg by mouth daily.   metFORMIN 1000 MG tablet Commonly known as: GLUCOPHAGE Take 1,000 mg by mouth 2 (two) times daily with a meal.   omeprazole 40 MG capsule Commonly known as: PRILOSEC Take 1 capsule (40 mg total) by mouth in the morning and at bedtime.   prochlorperazine 10 MG tablet Commonly known as: COMPAZINE Take 1 tablet (10 mg total) by mouth every 6 (six) hours as needed for nausea or vomiting.  Trelegy Ellipta 100-62.5-25 MCG/ACT Aepb Generic drug: Fluticasone-Umeclidin-Vilant Inhale into the lungs.        Allergies:  Allergies  Allergen Reactions   Ivp Dye [Iodinated Contrast Media]    Shellfish Allergy Other (See Comments)    Reaction: unknown     Family History: Family History  Problem Relation Age of Onset   Asthma Mother    Heart disease Mother    Hypertension Mother    Diabetes Mother    Stroke Mother    Diabetes Sister    Diabetes Brother     Social History:  reports that she has been smoking cigarettes. She has never used smokeless tobacco. She reports that she does not drink  alcohol and does not use drugs.   Physical Exam: BP 104/62   Pulse (!) 114   Ht 5\' 6"  (1.676 m)   Wt 112 lb (50.8 kg)   BMI 18.08 kg/m   Constitutional:  Alert and oriented, No acute distress. HEENT: [place] AT, moist mucus membranes.  Trachea midline, no masses. Cardiovascular: No clubbing, cyanosis, or edema. Respiratory: Normal respiratory effort, no increased work of breathing. GI: Abdomen is soft, nontender, nondistended, no abdominal masses Skin: No rashes, bruises or suspicious lesions. Neurologic: Grossly intact, no focal deficits, moving all 4 extremities. Psychiatric: Normal mood and affect.  Laboratory Data:  Urinalysis Dipstick 3+blood/1+leukocyte/3+protein. Microscopy >30 WBC/30 RBC.   Pertinent Imaging: CT images were personally reviewed and interpreted.  Narrative & Impression  CLINICAL DATA:  Follow-up lung cancer   EXAM: CT CHEST, ABDOMEN AND PELVIS WITHOUT CONTRAST   TECHNIQUE: Multidetector CT imaging of the chest, abdomen and pelvis was performed following the standard protocol without IV contrast.   RADIATION DOSE REDUCTION: This exam was performed according to the departmental dose-optimization program which includes automated exposure control, adjustment of the mA and/or kV according to patient size and/or use of iterative reconstruction technique.   COMPARISON:  CT chest dated 06/17/2022.  PET-CT dated 06/15/2022.   FINDINGS: CT CHEST FINDINGS   Cardiovascular: Heart is top-normal in size. Trace pericardial fluid, improved.   No evidence of thoracic aortic aneurysm. Atherosclerotic calcifications of the aortic arch.   Mild coronary atherosclerosis of the LAD and right coronary artery.   Mediastinum/Nodes: Prevascular/AP window nodal mass now measures 3.3 x 2.1 cm (series 2/image 16), previously 3.9 x 5.6 cm.   Prior left supraclavicular lymphadenopathy is less conspicuous and may have resolved.   Visualized thyroid is unremarkable.    Lungs/Pleura: Near complete resolution of the prior extension of the prevascular/superior mediastinal nodal mass into the medial left upper lobe.   Bilateral pulmonary nodules, including:   --13 x 10 mm mixed density nodule in the anterior left upper lobe (series 4/image 30), unchanged   --2.5 x 2.1 cm mixed density nodule in the superior segment left lower lobe (series 4/image 39), previously 2.1 x 1.9 cm   --prior 5 mm solid nodule along the right minor fissure (series 3/image 87 of the prior study) has resolved   --14 x 18 mm mixed density nodule in the anterior right lower lobe (series 4/image 63), previously 11 x 9 mm   --near complete resolution of the prior 12 x 11 mm predominantly solid nodule in the central right lower lobe, with mild residual scarring (series 4/image 87)   Additional scattered smaller mixed density nodules. Overall, there has been a waxing/waning appearance of some of these lesions.   No focal consolidation.   Mild centrilobular and paraseptal emphysematous changes, upper lung  predominant.   No pleural effusion or pneumothorax.   Musculoskeletal: Degenerative changes of the thoracic spine.   CT ABDOMEN PELVIS FINDINGS   Hepatobiliary: Scattered probable hepatic cysts measuring up to 1.9 cm in the central left hepatic lobe (series 2/image 50).   Layering small gallstone (series 2/image 60), without associated inflammatory changes. No intrahepatic or extrahepatic duct dilatation.   Pancreas: Within normal limits.   Spleen: Within normal limits.   Adrenals/Urinary Tract: Adrenal glands are within normal limits.   3 mm nonobstructing left upper pole renal calculus (series 2/image 84). Right kidney is within normal limits. No hydronephrosis.   Multifocal polypoid bladder mass, predominantly along the right posterior bladder wall, measuring at least 4.7 cm (series 2/image 97), compatible with primary bladder carcinoma.   Stomach/Bowel:  Stomach is within normal limits.   No evidence of bowel obstruction.   Normal appendix (series 2/image 82).   No colonic wall thickening or inflammatory changes.   Vascular/Lymphatic: No evidence of abdominal aortic aneurysm.   Atherosclerotic calcifications of the abdominal aorta and branch vessels.   No suspicious abdominopelvic lymphadenopathy.   Reproductive: Status post hysterectomy.   No adnexal masses.   Other: No abdominopelvic ascites.   Musculoskeletal: Status post PLIF at L5-S1. Degenerative changes of the lumbar spine.   IMPRESSION: Bilateral pulmonary nodules, some of which are waxing/waning, but favoring multifocal lung carcinoma/metastases.   Improving thoracic nodal metastases, as above.   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 Consider cystoscopy as clinically warranted.   Aortic Atherosclerosis (ICD10-I70.0) and Emphysema (ICD10-J43.9).     Electronically Signed   By: Charline Bills M.D.   On: 10/06/2022 00:53     Assessment & Plan:    1. 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 CT findings were discussed in detail with Ms. Gladden. We discussed scheduling of a cystoscopy for confirmation vs. cystoscopy under anesthesia with TURBT. She has initially elected to perform in the office and will get scheduled within the next 1-2 weeks. Urine culture was ordered.      [facility] [address], [address] [place], [place] [address] [phone]

## 2022-11-17 NOTE — Assessment & Plan Note (Signed)
Recommend smoking cessation.

## 2022-11-17 NOTE — Assessment & Plan Note (Signed)
Immunotherapy treatment plan as listed above. 

## 2022-11-17 NOTE — Assessment & Plan Note (Signed)
pending urology evaluation

## 2022-11-17 NOTE — Progress Notes (Signed)
Hematology/Oncology Progress note [phone] [phone]        CHIEF COMPLAINTS/PURPOSE OF CONSULTATION:  Stage IV Lung squamous cell carcinoma  ASSESSMENT & PLAN:   Cancer Staging  Malignant neoplasm of lung (HCC) Staging form: Lung, AJCC 8th Edition - Clinical: Stage IVA (cT4, cN3, cM1a) - Signed by Rickard Patience, MD on 06/28/2022   Malignant neoplasm of lung (HCC) Non-small cell lung cancer, favor squamous cell carcinoma, stage IV- TPS 90%, TMB 10, KRAS G12V MRI brain with and without contrast -  Left occipital focus of prior hemorrhage with mild linear contrast enhancement. Possible small metastasis or microhemmorhagic event.  TPS 90%, She tolerates immunotherapy CT scan showed mixed response. Clinically she is doing better Recommend to continue current regimen.  Labs are reviewed and discussed with patient.  Proceed with Keytruda  Brain lesion, repeat MRI in May 2024 shows no brain metastatic disease.   Acute pulmonary embolism (HCC) She has been off  Elqiuis 5mg  BID due to GI bleeding  Encounter for antineoplastic immunotherapy Immunotherapy treatment plan as listed above.  Normocytic anemia Chronic anemia.  Hemoglobin is stable   Bladder mass pending urology evaluation  Tobacco abuse Recommend smoking cessation   No orders of the defined types were placed in this encounter.  Follow up in 3 weeks.   All questions were answered. The patient knows to call the clinic with any problems, questions or concerns.  Rickard Patience, MD, PhD [facility] Hematology Oncology 11/17/2022       HISTORY OF PRESENTING ILLNESS:  Regina Perkins 82 y.o. female presents to establish care for Stage IV Lung squamous cell carcinoma I have reviewed her chart and materials related to her cancer extensively and collaborated history with the patient. Summary of oncologic history is as follows: Oncology History  Malignant neoplasm of lung (HCC)  05/24/2022 Imaging   CT  chest angiogram showed 1. No evidence for pulmonary embolism. 2. Left upper lobe/prevascular mass worrisome for neoplasm. 3. Mediastinal and hilar lymphadenopathy. 4. Multiple pulmonary nodules measuring up to 11 mm worrisome for metastatic disease. 5. Small left pleural effusion. 6. Patchy ground-glass and airspace opacities in the left upper lobe worrisome for infection. 7. Moderate-sized pericardial effusion. 8. Cholelithiasis. 9. Nonobstructing left renal calculus   05/25/2022 Imaging   CT abdomen pelvis wo contrast 1. No acute findings or explanation for the patient's symptoms. 2. No evidence of primary malignancy or metastatic disease within the abdomen or pelvis on noncontrast imaging.  3. Cholelithiasis without evidence of cholecystitis or biliary dilatation. 4. Nonobstructing left renal calculus. 5. Moderate stool throughout the colon suggesting constipation. 6.  Aortic Atherosclerosis    05/26/2022 Initial Diagnosis   Malignant neoplasm of lung - TPS 90%, TMB 10, KRAS G12V  -05/22/2022 in the emergency room, she was found to have hypoxia with oxygen levels of 86% on room air, improved to 94 with 2 L of oxygen.  D-dimer was elevated at 1.29, troponin negative.  Lower extremity Doppler was negative for DVT.  VQ scan high probability PE (Large segmental perfusion defect of the left upper lobe without corresponding radiographic abnormality. Additional bilateral wedge-shaped subsegmental branch perfusion defects .  Patient was admitted and started on heparin Echocardiogram showed no RV strain.  Moderate pericardial effusion. Over her hospitalization, she continues to have shortness of breath with minimal exertion. 05/24/2022, CT chest angiogram showed left lung mass with bilateral lung nodules.   06/16/2022 Imaging   PET scan  1. Hypermetabolic prevascular mass involves the mediastinum and  medial left upper lobe with hypermetabolic lymph nodes extending to the right supraclavicular  station, bilateral pulmonary nodules and a hypermetabolic pleural nodule in the left hemithorax, findings indicative of stage IV primary bronchogenic carcinoma. No evidence of metastatic disease in the abdomen or pelvis. 2. Moderate pericardial effusion. 3. Tiny left pleural effusion. 4. Left renal stone. 5. Aortic atherosclerosis (ICD10-I70.0). Coronary artery calcification. 6. Enlarged pulmonic trunk, indicative of pulmonary arterial hypertension    06/21/2022 Procedure   US guided liver right supraclvicular node biopsy showed Metastatic poorly differentiated carcinoma, favor squamous cell carcinoma, probably pulmonary origin.    06/28/2022 Cancer Staging   Staging form: Lung, AJCC 8th Edition - Clinical: Stage IVA (cT4, cN3, cM1a) - Signed by Rickard Patience, MD on 06/28/2022   07/14/2022 -  Chemotherapy   Patient is on Treatment Plan : LUNG NSCLC Pembrolizumab (200) q21d     09/29/2022 Imaging   CT chest abdomen pelvis w contrast showed Bilateral pulmonary nodules, some of which are waxing/waning, but favoring multifocal lung carcinoma/metastases.   Improving thoracic nodal metastases, as above.  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 Consider cystoscopy as clinically warranted.  Aortic Atherosclerosis (ICD10-I70.0) and Emphysema (ICD10-J43.9)     INTERVAL HISTORY Regina Perkins is a 82 y.o. female who has above history reviewed by me today presents for follow up visit for Lung squamous cell carcinoma She tolerates [place] well. Denies skin rash, diarrhea.  chronic SOB with exertion, improved she has been using her inhalers.  Patient reports feeling well.  No new complaints. Patient has been seen by urology.  There is plan for upcoming cystoscopy for workup of bladder mass.  MEDICAL HISTORY:  Past Medical History:  Diagnosis Date   Chronic kidney disease, stage 3a (HCC)    Diabetes mellitus without complication (HCC)    HTN (hypertension)    Hypertension    Type II diabetes mellitus with  renal manifestations (HCC)     SURGICAL HISTORY: Past Surgical History:  Procedure Laterality Date   ABDOMINAL HYSTERECTOMY     BACK SURGERY     1988 and 1998   ESOPHAGOGASTRODUODENOSCOPY (EGD) WITH PROPOFOL N/A 07/29/2022   Procedure: ESOPHAGOGASTRODUODENOSCOPY (EGD) WITH PROPOFOL;  Surgeon: Wyline Mood, MD;  Location: [facility] ENDOSCOPY;  Service: Gastroenterology;  Laterality: N/A;    SOCIAL HISTORY: Social History   Socioeconomic History   Marital status: Widowed    Spouse name: Not on file   Number of children: Not on file   Years of education: Not on file   Highest education level: Not on file  Occupational History   Not on file  Tobacco Use   Smoking status: Every Day    Current packs/day: 0.50    Types: Cigarettes   Smokeless tobacco: Never  Vaping Use   Vaping status: Never Used  Substance and Sexual Activity   Alcohol use: No   Drug use: No   Sexual activity: Not on file  Other Topics Concern   Not on file  Social History Narrative   Not on file   Social Determinants of Health   Financial Resource Strain: Low Risk  (06/28/2022)   Overall Financial Resource Strain (CARDIA)    Difficulty of Paying Living Expenses: Not very hard  Food Insecurity: Food Insecurity Present (06/28/2022)   Hunger Vital Sign    Worried About Running Out of Food in the Last Year: Often true    Ran Out of Food in the Last Year: Often true  Transportation Needs: Unmet Transportation Needs (07/13/2022)   PRAPARE -  Administrator, Civil Service (Medical): Yes    Lack of Transportation (Non-Medical): Yes  Physical Activity: Not on file  Stress: No Stress Concern Present (06/28/2022)   [employer] of Occupational Health - Occupational Stress Questionnaire    Feeling of Stress : Not at all  Social Connections: Not on file  Intimate Partner Violence: Not At Risk (06/28/2022)   Humiliation, Afraid, Rape, and Kick questionnaire    Fear of Current or Ex-Partner: No    Emotionally  Abused: No    Physically Abused: No    Sexually Abused: No    FAMILY HISTORY: Family History  Problem Relation Age of Onset   Asthma Mother    Heart disease Mother    Hypertension Mother    Diabetes Mother    Stroke Mother    Diabetes Sister    Diabetes Brother     ALLERGIES:  is allergic to ivp dye [iodinated contrast media] and shellfish allergy.  MEDICATIONS:  Current Outpatient Medications  Medication Sig Dispense Refill   albuterol (VENTOLIN HFA) 108 (90 Base) MCG/ACT inhaler Inhale 2 puffs into the lungs every 4 (four) hours as needed for wheezing or shortness of breath.     amLODipine (NORVASC) 10 MG tablet Take 10 mg by mouth daily.     ascorbic acid (VITAMIN C) 500 MG tablet Take 500 mg by mouth daily.     benzonatate (TESSALON) 200 MG capsule Take 1 capsule (200 mg total) by mouth 3 (three) times daily as needed for cough. 20 capsule    BREZTRI AEROSPHERE 160-9-4.8 MCG/ACT AERO Inhale into the lungs.     cyanocobalamin (VITAMIN B12) 1000 MCG tablet Take 1 tablet (1,000 mcg total) by mouth daily. 90 tablet 0   fluticasone (FLONASE) 50 MCG/ACT nasal spray Place 1 spray into both nostrils daily.     Fluticasone-Umeclidin-Vilant (TRELEGY ELLIPTA) 100-62.5-25 MCG/ACT AEPB Inhale into the lungs.     furosemide (LASIX) 40 MG tablet Take 1 tablet (40 mg total) by mouth daily. 60 tablet 0   lisinopril (ZESTRIL) 40 MG tablet Take 40 mg by mouth daily.     metFORMIN (GLUCOPHAGE) 1000 MG tablet Take 1,000 mg by mouth 2 (two) times daily with a meal.     omeprazole (PRILOSEC) 40 MG capsule Take 1 capsule (40 mg total) by mouth in the morning and at bedtime. 60 capsule 2   prochlorperazine (COMPAZINE) 10 MG tablet Take 1 tablet (10 mg total) by mouth every 6 (six) hours as needed for nausea or vomiting. 30 tablet 1   No current facility-administered medications for this visit.    Review of Systems  Constitutional:  Negative for appetite change, chills, fatigue and fever.  HENT:    Negative for hearing loss and voice change.   Eyes:  Negative for eye problems.  Respiratory:  Positive for shortness of breath. Negative for chest tightness and cough.   Cardiovascular:  Negative for chest pain.  Gastrointestinal:  Negative for abdominal distention, abdominal pain and blood in stool.  Endocrine: Negative for hot flashes.  Genitourinary:  Negative for difficulty urinating and frequency.   Musculoskeletal:  Negative for arthralgias.  Skin:  Negative for itching and rash.  Neurological:  Negative for extremity weakness.  Hematological:  Negative for adenopathy.  Psychiatric/Behavioral:  Negative for confusion.      PHYSICAL EXAMINATION: ECOG PERFORMANCE STATUS: 2 - Symptomatic, <50% confined to bed  Vitals:   11/17/22 1324  BP: 133/89  Pulse: 98  Resp: 18  Temp:  97.6 F (36.4 C)  SpO2: 100%   Filed Weights   11/17/22 1324  Weight: 115 lb 9.6 oz (52.4 kg)    Physical Exam Constitutional:      General: She is not in acute distress.    Appearance: She is not diaphoretic.  HENT:     Head: Normocephalic.  Eyes:     General: No scleral icterus.    Pupils: Pupils are equal, round, and reactive to light.  Cardiovascular:     Heart sounds: No murmur heard. Pulmonary:     Effort: Pulmonary effort is normal. No respiratory distress.     Comments: Decreased breath sounds bilaterally Abdominal:     General: There is no distension.  Musculoskeletal:        General: Normal range of motion.     Cervical back: Normal range of motion.  Skin:    Findings: No erythema.  Neurological:     Mental Status: She is alert and oriented to person, place, and time. Mental status is at baseline.     Cranial Nerves: No cranial nerve deficit.     Motor: No abnormal muscle tone.  Psychiatric:        Mood and Affect: Affect normal.      LABORATORY DATA:  I have reviewed the data as listed    Latest Ref Rng & Units 11/17/2022    1:12 PM 10/27/2022   10:00 AM 10/06/2022     9:52 AM  CBC  WBC 4.0 - 10.5 K/uL 8.4  8.5  6.3   Hemoglobin 12.0 - 15.0 g/dL 75.6  43.3  29.5   Hematocrit 36.0 - 46.0 % 35.5  36.9  38.9   Platelets 150 - 400 K/uL 244  287  279       Latest Ref Rng & Units 11/17/2022    1:12 PM 10/27/2022   10:00 AM 10/06/2022    9:52 AM  CMP  Glucose 70 - 99 mg/dL 188  416  606   BUN 8 - 23 mg/dL 32  29  28   Creatinine 0.44 - 1.00 mg/dL 3.01  6.01  0.93   Sodium 135 - 145 mmol/L 135  131  138   Potassium 3.5 - 5.1 mmol/L 4.5  4.0  3.4   Chloride 98 - 111 mmol/L 101  98  104   CO2 22 - 32 mmol/L 27  25  24    Calcium 8.9 - 10.3 mg/dL 9.0  9.1  8.3   Total Protein 6.5 - 8.1 g/dL 6.8  7.1  7.7   Total Bilirubin 0.3 - 1.2 mg/dL 0.2  0.5  0.1   Alkaline Phos 38 - 126 U/L 58  60  62   AST 15 - 41 U/L 26  30  38   ALT 0 - 44 U/L 21  25  24       RADIOGRAPHIC STUDIES: I have personally reviewed the radiological images as listed and agreed with the findings in the report. No results found.

## 2022-11-17 NOTE — Assessment & Plan Note (Signed)
Chronic anemia.  Hemoglobin is stable

## 2022-11-17 NOTE — Assessment & Plan Note (Addendum)
She has been off  Elqiuis 5mg  BID due to GI bleeding

## 2022-11-17 NOTE — Assessment & Plan Note (Signed)
Non-small cell lung cancer, favor squamous cell carcinoma, stage IV- TPS 90%, TMB 10, KRAS G12V MRI brain with and without contrast -  Left occipital focus of prior hemorrhage with mild linear contrast enhancement. Possible small metastasis or microhemmorhagic event.  TPS 90%, She tolerates immunotherapy CT scan showed mixed response. Clinically she is doing better Recommend to continue current regimen.  Labs are reviewed and discussed with patient.  Proceed with Keytruda  Brain lesion, repeat MRI in May 2024 shows no brain metastatic disease.

## 2022-11-17 NOTE — Patient Instructions (Signed)
[facility]  Discharge Instructions: Thank you for choosing [facility] to provide your oncology and hematology care.  If you have a lab appointment with the Cancer Center, please go directly to the Cancer Center and check in at the registration area.  Wear comfortable clothing and clothing appropriate for easy access to any Portacath or PICC line.   We strive to give you quality time with your provider. You may need to reschedule your appointment if you arrive late (15 or more minutes).  Arriving late affects you and other patients whose appointments are after yours.  Also, if you miss three or more appointments without notifying the office, you may be dismissed from the clinic at the provider's discretion.      For prescription refill requests, have your pharmacy contact our office and allow 72 hours for refills to be completed.    Today you received the following chemotherapy and/or immunotherapy agents- Keytruda      To help prevent nausea and vomiting after your treatment, we encourage you to take your nausea medication as directed.  BELOW ARE SYMPTOMS THAT SHOULD BE REPORTED IMMEDIATELY: *FEVER GREATER THAN 100.4 F (38 C) OR HIGHER *CHILLS OR SWEATING *NAUSEA AND VOMITING THAT IS NOT CONTROLLED WITH YOUR NAUSEA MEDICATION *UNUSUAL SHORTNESS OF BREATH *UNUSUAL BRUISING OR BLEEDING *URINARY PROBLEMS (pain or burning when urinating, or frequent urination) *BOWEL PROBLEMS (unusual diarrhea, constipation, pain near the anus) TENDERNESS IN MOUTH AND THROAT WITH OR WITHOUT PRESENCE OF ULCERS (sore throat, sores in mouth, or a toothache) UNUSUAL RASH, SWELLING OR PAIN  UNUSUAL VAGINAL DISCHARGE OR ITCHING   Items with * indicate a potential emergency and should be followed up as soon as possible or go to the Emergency Department if any problems should occur.  Please show the CHEMOTHERAPY ALERT CARD or IMMUNOTHERAPY ALERT CARD at check-in to  the Emergency Department and triage nurse.  Should you have questions after your visit or need to cancel or reschedule your appointment, please contact [facility]  [phone] and follow the prompts.  Office hours are 8:00 a.m. to 4:30 p.m. Monday - Friday. Please note that voicemails left after 4:00 p.m. may not be returned until the following business day.  We are closed weekends and major holidays. You have access to a nurse at all times for urgent questions. Please call the main number to the clinic [phone] and follow the prompts.  For any non-urgent questions, you may also contact your provider using MyChart. We now offer e-Visits for anyone 18 and older to request care online for non-urgent symptoms. For details visit [email or web address].   Also download the MyChart app! Go to the app store, search "MyChart", open the app, select [facility], and log in with your MyChart username and password.   

## 2022-11-25 ENCOUNTER — Other Ambulatory Visit: Payer: Self-pay

## 2022-11-25 MED ORDER — CEFUROXIME AXETIL 250 MG PO TABS: 250.0000 mg | ORAL_TABLET | Freq: Two times a day (BID) | ORAL | 0 refills | Status: DC

## 2022-11-29 ENCOUNTER — Other Ambulatory Visit: Payer: Self-pay

## 2022-11-29 ENCOUNTER — Ambulatory Visit (INDEPENDENT_AMBULATORY_CARE_PROVIDER_SITE_OTHER): Payer: Medicare Other | Admitting: Urology

## 2022-11-29 ENCOUNTER — Encounter: Payer: Self-pay | Admitting: Urology

## 2022-11-29 VITALS — BP 104/62 | HR 92

## 2022-11-29 DIAGNOSIS — D494 Neoplasm of unspecified behavior of bladder: Secondary | ICD-10-CM | POA: Diagnosis not present

## 2022-11-29 DIAGNOSIS — N3289 Other specified disorders of bladder: Secondary | ICD-10-CM

## 2022-11-29 LAB — MICROSCOPIC EXAMINATION
RBC, Urine: >30 /hpf — AB (ref 0–2)
WBC, UA: >30 /hpf — AB (ref 0–5)

## 2022-11-29 LAB — URINALYSIS, COMPLETE
Bilirubin, UA: NEGATIVE
Glucose, UA: NEGATIVE
Ketones, UA: NEGATIVE
Nitrite, UA: NEGATIVE
Specific Gravity, UA: 1.015 (ref 1.005–1.030)
Urobilinogen, Ur: 0.2 mg/dL (ref 0.2–1.0)
pH, UA: 6 (ref 5.0–7.5)

## 2022-11-29 NOTE — H&P (View-Only) (Signed)
 11/29/2022 12:30 PM   Regina Perkins [birth year] [id number]  Referring provider: [facility], [facility] [address] [place],  [place] [address]  Chief Complaint  Patient presents with   Cysto    HPI: Regina Perkins is a 82 y.o. female initially seen 11/17/2022 after a CT incidentally showed multifocal bladder masses measuring up to 4.7 cm.  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  She is scheduled for TURBT with bilateral retrograde pyelogram.   PMH: Past Medical History:  Diagnosis Date   Chronic kidney disease, stage 3a (HCC)    Diabetes mellitus without complication (HCC)    HTN (hypertension)    Hypertension    Type II diabetes mellitus with renal manifestations [facility])     Surgical History: Past Surgical History:  Procedure Laterality Date   ABDOMINAL HYSTERECTOMY     BACK SURGERY     1988 and 1998   ESOPHAGOGASTRODUODENOSCOPY (EGD) WITH PROPOFOL N/A 07/29/2022   Procedure: ESOPHAGOGASTRODUODENOSCOPY (EGD) WITH PROPOFOL;  Surgeon: Wyline Mood, MD;  Location: [facility] ENDOSCOPY;  Service: Gastroenterology;  Laterality: N/A;    Home Medications:  Allergies as of 11/29/2022       Reactions   Ivp Dye [iodinated Contrast Media]    Shellfish Allergy Other (See Comments)   Reaction: unknown         Medication List        Accurate as of November 29, 2022 12:30 PM. If you have any questions, ask your nurse or doctor.          albuterol 108 (90 Base) MCG/ACT inhaler Commonly known as: VENTOLIN HFA Inhale 2 puffs into the lungs every 4 (four) hours as needed for wheezing or shortness of breath.   amLODipine 10 MG tablet Commonly known as: NORVASC Take 10 mg by mouth daily.   ascorbic acid 500 MG tablet Commonly known as: VITAMIN C Take 500 mg by mouth daily.   benzonatate 200 MG capsule Commonly known as: TESSALON Take 1 capsule (200 mg  total) by mouth 3 (three) times daily as needed for cough.   Breztri Aerosphere 160-9-4.8 MCG/ACT Aero Generic drug: Budeson-Glycopyrrol-Formoterol Inhale into the lungs.   cefUROXime 250 MG tablet Commonly known as: CEFTIN Take 1 tablet (250 mg total) by mouth 2 (two) times daily with a meal.   cyanocobalamin 1000 MCG tablet Commonly known as: VITAMIN B12 Take 1 tablet (1,000 mcg total) by mouth daily.   fluticasone 50 MCG/ACT nasal spray Commonly known as: FLONASE Place 1 spray into both nostrils daily.   furosemide 40 MG tablet Commonly known as: Lasix Take 1 tablet (40 mg total) by mouth daily.   lisinopril 40 MG tablet Commonly known as: ZESTRIL Take 40 mg by mouth daily.   metFORMIN 1000 MG tablet Commonly known as: GLUCOPHAGE Take 1,000 mg by mouth 2 (two) times daily with a meal.   omeprazole 40 MG capsule Commonly known as: PRILOSEC Take 1 capsule (40 mg total) by mouth in the morning and at bedtime.   prochlorperazine 10 MG tablet Commonly known as: COMPAZINE Take 1 tablet (10 mg total) by mouth every 6 (six) hours as needed for nausea or vomiting.   Trelegy Ellipta 100-62.5-25 MCG/ACT Aepb Generic drug: Fluticasone-Umeclidin-Vilant Inhale into the lungs.        Allergies:  Allergies  Allergen Reactions   Ivp Dye [Iodinated Contrast Media]    Shellfish Allergy Other (See Comments)    Reaction:  unknown     Family History: Family History  Problem Relation Age of Onset   Asthma Mother    Heart disease Mother    Hypertension Mother    Diabetes Mother    Stroke Mother    Diabetes Sister    Diabetes Brother     Social History:  reports that she has been smoking cigarettes. She has never used smokeless tobacco. She reports that she does not drink alcohol and does not use drugs.   Physical Exam: BP 104/62   Pulse 92   Constitutional:  Alert and oriented, No acute distress. HEENT: [place] AT Respiratory: Normal respiratory effort, no increased  work of breathing. Psychiatric: Normal mood and affect.  Laboratory Data: Lab Results  Component Value Date   WBC 8.4 11/17/2022   HGB 11.2 (L) 11/17/2022   HCT 35.5 (L) 11/17/2022   MCV 88.5 11/17/2022   PLT 244 11/17/2022    Lab Results  Component Value Date   CREATININE 1.05 (H) 11/17/2022    Lab Results  Component Value Date   HGBA1C 7.1 (H) 05/22/2022     Assessment & Plan:    1. Bladder tumors Scheduled for TURBT and bilateral retrograde pyelogram Multifocal tumors and will most likely need intravesical chemotherapy if superficial disease is confirmed and will not plan on intravesical gemcitabine The procedure was discussed in detail including bleeding, infection, bladder injury/perforation.  All questions were answered and she desires to proceed  [name], MD  [facility] [address], [address] [place], [place] [address] [phone]

## 2022-11-29 NOTE — H&P (View-Only) (Signed)
   11/29/22  CC:  Chief Complaint  Patient presents with   Cysto    HPI: Refer to my previous office note 11/17/2022   Cystoscopy Procedure Note  Patient identification was confirmed, informed consent was obtained, and patient was prepped using Betadine solution.  Lidocaine jelly was administered per urethral meatus.    Procedure: - Flexible cystoscope introduced, with mild difficulty secondary to a tight urethra - Thorough search of the bladder revealed:    Multiple papillary lesions consistent with urothelial carcinoma;      visualization was suboptimal -Unable to visualize UOs     Post-Procedure: - Patient tolerated the procedure well  Assessment/ Plan: Multifocal papillary bladder tumors endoscopically consistent with urothelial carcinoma Findings were discussed with Regina Perkins and recommended scheduling cystoscopy under anesthesia with TURBT Due to the multiplicity of these lesions she will most likely need post resection intravesical therapy if tumors are superficial and will not plan intravesical gemcitabine postresection Cath urine was obtained and sent for repeat UA/urine culture    [name], MD

## 2022-11-29 NOTE — Progress Notes (Signed)
11/29/2022 12:30 PM   Regina Perkins [birth year] [id number]  Referring provider: [facility], [facility] [address] [place],  [place] [address]  Chief Complaint  Patient presents with   Cysto    HPI: Regina Perkins is a 82 y.o. female initially seen 11/17/2022 after a CT incidentally showed multifocal bladder masses measuring up to 4.7 cm.  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  She is scheduled for TURBT with bilateral retrograde pyelogram.   PMH: Past Medical History:  Diagnosis Date   Chronic kidney disease, stage 3a (HCC)    Diabetes mellitus without complication (HCC)    HTN (hypertension)    Hypertension    Type II diabetes mellitus with renal manifestations [facility])     Surgical History: Past Surgical History:  Procedure Laterality Date   ABDOMINAL HYSTERECTOMY     BACK SURGERY     1988 and 1998   ESOPHAGOGASTRODUODENOSCOPY (EGD) WITH PROPOFOL N/A 07/29/2022   Procedure: ESOPHAGOGASTRODUODENOSCOPY (EGD) WITH PROPOFOL;  Surgeon: Wyline Mood, MD;  Location: [facility] ENDOSCOPY;  Service: Gastroenterology;  Laterality: N/A;    Home Medications:  Allergies as of 11/29/2022       Reactions   Ivp Dye [iodinated Contrast Media]    Shellfish Allergy Other (See Comments)   Reaction: unknown         Medication List        Accurate as of November 29, 2022 12:30 PM. If you have any questions, ask your nurse or doctor.          albuterol 108 (90 Base) MCG/ACT inhaler Commonly known as: VENTOLIN HFA Inhale 2 puffs into the lungs every 4 (four) hours as needed for wheezing or shortness of breath.   amLODipine 10 MG tablet Commonly known as: NORVASC Take 10 mg by mouth daily.   ascorbic acid 500 MG tablet Commonly known as: VITAMIN C Take 500 mg by mouth daily.   benzonatate 200 MG capsule Commonly known as: TESSALON Take 1 capsule (200 mg  total) by mouth 3 (three) times daily as needed for cough.   Breztri Aerosphere 160-9-4.8 MCG/ACT Aero Generic drug: Budeson-Glycopyrrol-Formoterol Inhale into the lungs.   cefUROXime 250 MG tablet Commonly known as: CEFTIN Take 1 tablet (250 mg total) by mouth 2 (two) times daily with a meal.   cyanocobalamin 1000 MCG tablet Commonly known as: VITAMIN B12 Take 1 tablet (1,000 mcg total) by mouth daily.   fluticasone 50 MCG/ACT nasal spray Commonly known as: FLONASE Place 1 spray into both nostrils daily.   furosemide 40 MG tablet Commonly known as: Lasix Take 1 tablet (40 mg total) by mouth daily.   lisinopril 40 MG tablet Commonly known as: ZESTRIL Take 40 mg by mouth daily.   metFORMIN 1000 MG tablet Commonly known as: GLUCOPHAGE Take 1,000 mg by mouth 2 (two) times daily with a meal.   omeprazole 40 MG capsule Commonly known as: PRILOSEC Take 1 capsule (40 mg total) by mouth in the morning and at bedtime.   prochlorperazine 10 MG tablet Commonly known as: COMPAZINE Take 1 tablet (10 mg total) by mouth every 6 (six) hours as needed for nausea or vomiting.   Trelegy Ellipta 100-62.5-25 MCG/ACT Aepb Generic drug: Fluticasone-Umeclidin-Vilant Inhale into the lungs.        Allergies:  Allergies  Allergen Reactions   Ivp Dye [Iodinated Contrast Media]    Shellfish Allergy Other (See Comments)    Reaction:  unknown     Family History: Family History  Problem Relation Age of Onset   Asthma Mother    Heart disease Mother    Hypertension Mother    Diabetes Mother    Stroke Mother    Diabetes Sister    Diabetes Brother     Social History:  reports that she has been smoking cigarettes. She has never used smokeless tobacco. She reports that she does not drink alcohol and does not use drugs.   Physical Exam: BP 104/62   Pulse 92   Constitutional:  Alert and oriented, No acute distress. HEENT: [place] AT Respiratory: Normal respiratory effort, no increased  work of breathing. Psychiatric: Normal mood and affect.  Laboratory Data: Lab Results  Component Value Date   WBC 8.4 11/17/2022   HGB 11.2 (L) 11/17/2022   HCT 35.5 (L) 11/17/2022   MCV 88.5 11/17/2022   PLT 244 11/17/2022    Lab Results  Component Value Date   CREATININE 1.05 (H) 11/17/2022    Lab Results  Component Value Date   HGBA1C 7.1 (H) 05/22/2022     Assessment & Plan:    1. Bladder tumors Scheduled for TURBT and bilateral retrograde pyelogram Multifocal tumors and will most likely need intravesical chemotherapy if superficial disease is confirmed and will not plan on intravesical gemcitabine The procedure was discussed in detail including bleeding, infection, bladder injury/perforation.  All questions were answered and she desires to proceed  [name], MD  [facility] [address], [address] [place], [place] [address] [phone]

## 2022-11-29 NOTE — Progress Notes (Signed)
   11/29/22  CC:  Chief Complaint  Patient presents with   Cysto    HPI: Refer to my previous office note 11/17/2022   Cystoscopy Procedure Note  Patient identification was confirmed, informed consent was obtained, and patient was prepped using Betadine solution.  Lidocaine jelly was administered per urethral meatus.    Procedure: - Flexible cystoscope introduced, with mild difficulty secondary to a tight urethra - Thorough search of the bladder revealed:    Multiple papillary lesions consistent with urothelial carcinoma;      visualization was suboptimal -Unable to visualize UOs     Post-Procedure: - Patient tolerated the procedure well  Assessment/ Plan: Multifocal papillary bladder tumors endoscopically consistent with urothelial carcinoma Findings were discussed with Ms. Creque and recommended scheduling cystoscopy under anesthesia with TURBT Due to the multiplicity of these lesions she will most likely need post resection intravesical therapy if tumors are superficial and will not plan intravesical gemcitabine postresection Cath urine was obtained and sent for repeat UA/urine culture    [name], MD

## 2022-11-29 NOTE — Progress Notes (Unsigned)
Surgical Physician Order Form [facility]  Dr. Irineo Axon, MD  * Scheduling expectation : Next Available  *Length of Case: 90 minutes (large tumors)  *Clearance needed: yes  *Anticoagulation Instructions: N/A  *Aspirin Instructions: N/A  *Post-op visit Date/Instructions:  1-3 day cath removal  *Diagnosis: Bladder Tumor  *Procedure:  TURBT >5cm (46270); bilateral retrograde pyelogram   Additional orders: N/A  -Admit type: OUTpatient  -Anesthesia: Choice  -VTE Prophylaxis Standing Order SCD's       Other:   -Standing Lab Orders Per Anesthesia    Lab other: UA&Urine Culture-ordered 11/29/2022  -Standing Test orders EKG/Chest x-ray per Anesthesia       Test other:   - Medications:  Gentamicin per pharmacy  -Other orders:  N/A

## 2022-12-02 ENCOUNTER — Telehealth: Payer: Self-pay

## 2022-12-02 NOTE — Progress Notes (Signed)
   [facility] Surgical Posting Form  Surgery Date: Date: 12/21/2022  Surgeon: Dr. Irineo Axon, MD  Inpt ( No  )   Outpt (Yes)   Obs ( No  )   Diagnosis: D49.4 Bladder Tumor  -CPT: 52240, [phone]  Surgery: Transurethral Resection of Bladder Tumor, Bilateral Retrograde Pyelogram  Stop Anticoagulations: No  Cardiac/Medical/Pulmonary Clearance needed: Yes   Clearance needed from Dr: Cathie Hoops  Clearance request sent on: Date: 12/02/22   *Orders entered into EPIC  Date: 12/02/22   *Case booked in EPIC  Date: 11/30/2022  *Notified pt of Surgery: Date: 11/30/2022  PRE-OP UA & CX: yes, obtained in office on 11/29/2022  *Placed into Prior Authorization Work Angela Nevin Date: 12/02/22  Assistant/laser/rep:No

## 2022-12-02 NOTE — Telephone Encounter (Signed)
I spoke with Mr. Regina Perkins. We have discussed possible surgery dates and Tuesday August 27th, 2024 was agreed upon by all parties. Patient given information about surgery date, what to expect pre-operatively and post operatively.  We discussed that a Pre-Admission Testing office will be calling to set up the pre-op visit that will take place prior to surgery, and that these appointments are typically done over the phone with a Pre-Admissions RN. Informed patient that our office will communicate any additional care to be provided after surgery. Patients questions or concerns were discussed during our call. Advised to call our office should there be any additional information, questions or concerns that arise. Patient verbalized understanding.

## 2022-12-02 NOTE — Progress Notes (Signed)
  Phone Number: [phone] for Surgical Coordinator Fax Number: [phone]  REQUEST FOR SURGICAL CLEARANCE       Date: Date: 12/21/2022  Faxed to: Dr. Cathie Hoops  Surgeon: Dr. Irineo Axon, MD     Date of Surgery: 12/21/2022  Operation: Transurethral Resection of Bladder Tumor, Bilateral Retrograde Pyelogram    Anesthesia Type: Choice   Diagnosis: Bladder Tumor  Patient Requires:   Medical Clearance : Yes  Reason: If patient is stable enough for surgery.   Risk Assessment:    Low   []       Moderate   []     High   []           This patient is optimized for surgery  YES []       NO   []    I recommend further assessment/workup prior to surgery. YES []      NO  []   Appointment scheduled for: _______________________   Further recommendations: ____________________________________     Physician Signature:__________________________________   Printed Name: ________________________________________   Date: _________________

## 2022-12-08 ENCOUNTER — Inpatient Hospital Stay: Payer: Medicare Other

## 2022-12-08 ENCOUNTER — Encounter: Payer: Self-pay | Admitting: Oncology

## 2022-12-08 ENCOUNTER — Inpatient Hospital Stay: Payer: Medicare Other | Admitting: Oncology

## 2022-12-08 ENCOUNTER — Inpatient Hospital Stay: Payer: Medicare Other | Attending: Oncology

## 2022-12-08 VITALS — BP 141/81 | HR 77 | Temp 97.8°F | Resp 18 | Wt 116.4 lb

## 2022-12-08 DIAGNOSIS — I2699 Other pulmonary embolism without acute cor pulmonale: Secondary | ICD-10-CM | POA: Diagnosis not present

## 2022-12-08 DIAGNOSIS — C787 Secondary malignant neoplasm of liver and intrahepatic bile duct: Secondary | ICD-10-CM | POA: Insufficient documentation

## 2022-12-08 DIAGNOSIS — G939 Disorder of brain, unspecified: Secondary | ICD-10-CM | POA: Insufficient documentation

## 2022-12-08 DIAGNOSIS — Z86711 Personal history of pulmonary embolism: Secondary | ICD-10-CM | POA: Insufficient documentation

## 2022-12-08 DIAGNOSIS — N329 Bladder disorder, unspecified: Secondary | ICD-10-CM | POA: Diagnosis not present

## 2022-12-08 DIAGNOSIS — C771 Secondary and unspecified malignant neoplasm of intrathoracic lymph nodes: Secondary | ICD-10-CM | POA: Diagnosis not present

## 2022-12-08 DIAGNOSIS — Z5112 Encounter for antineoplastic immunotherapy: Secondary | ICD-10-CM | POA: Insufficient documentation

## 2022-12-08 DIAGNOSIS — Z79899 Other long term (current) drug therapy: Secondary | ICD-10-CM | POA: Diagnosis not present

## 2022-12-08 DIAGNOSIS — C3482 Malignant neoplasm of overlapping sites of left bronchus and lung: Secondary | ICD-10-CM

## 2022-12-08 DIAGNOSIS — D649 Anemia, unspecified: Secondary | ICD-10-CM | POA: Diagnosis not present

## 2022-12-08 DIAGNOSIS — F17210 Nicotine dependence, cigarettes, uncomplicated: Secondary | ICD-10-CM | POA: Diagnosis not present

## 2022-12-08 DIAGNOSIS — C3412 Malignant neoplasm of upper lobe, left bronchus or lung: Secondary | ICD-10-CM | POA: Insufficient documentation

## 2022-12-08 DIAGNOSIS — Z5982 Transportation insecurity: Secondary | ICD-10-CM | POA: Diagnosis not present

## 2022-12-08 DIAGNOSIS — N3289 Other specified disorders of bladder: Secondary | ICD-10-CM

## 2022-12-08 LAB — CBC WITH DIFFERENTIAL (CANCER CENTER ONLY)
Abs Immature Granulocytes: 0.01 10*3/uL (ref 0.00–0.07)
Basophils Absolute: 0 10*3/uL (ref 0.0–0.1)
Basophils Relative: 0 %
Eosinophils Absolute: 0.2 10*3/uL (ref 0.0–0.5)
Eosinophils Relative: 4 %
HCT: 35 % — ABNORMAL LOW (ref 36.0–46.0)
Hemoglobin: 11.2 g/dL — ABNORMAL LOW (ref 12.0–15.0)
Immature Granulocytes: 0 %
Lymphocytes Relative: 24 %
Lymphs Abs: 1.6 10*3/uL (ref 0.7–4.0)
MCH: 28 pg (ref 26.0–34.0)
MCHC: 32 g/dL (ref 30.0–36.0)
MCV: 87.5 fL (ref 80.0–100.0)
Monocytes Absolute: 0.4 10*3/uL (ref 0.1–1.0)
Monocytes Relative: 6 %
Neutro Abs: 4.4 10*3/uL (ref 1.7–7.7)
Neutrophils Relative %: 66 %
Platelet Count: 282 10*3/uL (ref 150–400)
RBC: 4 MIL/uL (ref 3.87–5.11)
RDW: 17.5 % — ABNORMAL HIGH (ref 11.5–15.5)
WBC Count: 6.6 10*3/uL (ref 4.0–10.5)
nRBC: 0 % (ref 0.0–0.2)

## 2022-12-08 LAB — CMP (CANCER CENTER ONLY)
ALT: 29 U/L (ref 0–44)
AST: 32 U/L (ref 15–41)
Albumin: 3.8 g/dL (ref 3.5–5.0)
Alkaline Phosphatase: 58 U/L (ref 38–126)
Anion gap: 8 (ref 5–15)
BUN: 32 mg/dL — ABNORMAL HIGH (ref 8–23)
CO2: 26 mmol/L (ref 22–32)
Calcium: 9 mg/dL (ref 8.9–10.3)
Chloride: 101 mmol/L (ref 98–111)
Creatinine: 0.9 mg/dL (ref 0.44–1.00)
GFR, Estimated: >60 mL/min (ref >60)
Glucose, Bld: 123 mg/dL — ABNORMAL HIGH (ref 70–99)
Potassium: 3.5 mmol/L (ref 3.5–5.1)
Sodium: 135 mmol/L (ref 135–145)
Total Bilirubin: 0.5 mg/dL (ref 0.3–1.2)
Total Protein: 6.8 g/dL (ref 6.5–8.1)

## 2022-12-08 MED ORDER — SODIUM CHLORIDE 0.9 % IV SOLN
200.0000 mg | Freq: Once | INTRAVENOUS | Status: AC
  Administered 2022-12-08: 200 mg via INTRAVENOUS
  Filled 2022-12-08: qty 8

## 2022-12-08 MED ORDER — SODIUM CHLORIDE 0.9 % IV SOLN
Freq: Once | INTRAVENOUS | Status: AC
  Filled 2022-12-08: qty 250

## 2022-12-08 NOTE — Assessment & Plan Note (Signed)
 Chronic anemia.  Hemoglobin is stable

## 2022-12-08 NOTE — Progress Notes (Signed)
Hematology/Oncology Progress note [phone] [phone]        CHIEF COMPLAINTS/PURPOSE OF CONSULTATION:  Stage IV Lung squamous cell carcinoma  ASSESSMENT & PLAN:   Cancer Staging  Malignant neoplasm of lung (HCC) Staging form: Lung, AJCC 8th Edition - Clinical: Stage IVA (cT4, cN3, cM1a) - Signed by Rickard Patience, MD on 06/28/2022   Malignant neoplasm of lung (HCC) Non-small cell lung cancer, favor squamous cell carcinoma, stage IV- TPS 90%, TMB 10, KRAS G12V MRI brain with and without contrast -  Left occipital focus of prior hemorrhage with mild linear contrast enhancement. Possible small metastasis or microhemmorhagic event.  TPS 90%, She tolerates immunotherapy CT scan showed mixed response. Clinically she is doing better Recommend to continue current regimen.  Labs are reviewed and discussed with patient.  Proceed with Keytruda  Brain lesion, repeat MRI in May 2024 shows no brain metastatic disease.   Acute pulmonary embolism (HCC) She has been off  Elqiuis 5mg  BID due to GI bleeding  Encounter for antineoplastic immunotherapy Immunotherapy treatment plan as listed above.  Normocytic anemia Chronic anemia.  Hemoglobin is stable   Bladder mass pending urology evaluation - TURBT    Orders Placed This Encounter  Procedures   CBC with Differential (Cancer Center Only)    Standing Status:   Future    Standing Expiration Date:   01/19/2024   CMP (Cancer Center only)    Standing Status:   Future    Standing Expiration Date:   01/19/2024   Follow up in 3 weeks.   All questions were answered. The patient knows to call the clinic with any problems, questions or concerns.  Rickard Patience, MD, PhD [facility] Hematology Oncology 12/08/2022       HISTORY OF PRESENTING ILLNESS:  Regina Perkins 82 y.o. female presents to establish care for Stage IV Lung squamous cell carcinoma I have reviewed her chart and materials related to her cancer extensively and  collaborated history with the patient. Summary of oncologic history is as follows: Oncology History  Malignant neoplasm of lung (HCC)  05/24/2022 Imaging   CT chest angiogram showed 1. No evidence for pulmonary embolism. 2. Left upper lobe/prevascular mass worrisome for neoplasm. 3. Mediastinal and hilar lymphadenopathy. 4. Multiple pulmonary nodules measuring up to 11 mm worrisome for metastatic disease. 5. Small left pleural effusion. 6. Patchy ground-glass and airspace opacities in the left upper lobe worrisome for infection. 7. Moderate-sized pericardial effusion. 8. Cholelithiasis. 9. Nonobstructing left renal calculus   05/25/2022 Imaging   CT abdomen pelvis wo contrast 1. No acute findings or explanation for the patient's symptoms. 2. No evidence of primary malignancy or metastatic disease within the abdomen or pelvis on noncontrast imaging.  3. Cholelithiasis without evidence of cholecystitis or biliary dilatation. 4. Nonobstructing left renal calculus. 5. Moderate stool throughout the colon suggesting constipation. 6.  Aortic Atherosclerosis    05/26/2022 Initial Diagnosis   Malignant neoplasm of lung - TPS 90%, TMB 10, KRAS G12V  -05/22/2022 in the emergency room, she was found to have hypoxia with oxygen levels of 86% on room air, improved to 94 with 2 L of oxygen.  D-dimer was elevated at 1.29, troponin negative.  Lower extremity Doppler was negative for DVT.  VQ scan high probability PE (Large segmental perfusion defect of the left upper lobe without corresponding radiographic abnormality. Additional bilateral wedge-shaped subsegmental branch perfusion defects .  Patient was admitted and started on heparin Echocardiogram showed no RV strain.  Moderate pericardial effusion. Over  her hospitalization, she continues to have shortness of breath with minimal exertion. 05/24/2022, CT chest angiogram showed left lung mass with bilateral lung nodules.   06/16/2022 Imaging   PET scan   1. 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 No evidence of metastatic disease in the abdomen or pelvis. 2. Moderate pericardial effusion. 3. Tiny left pleural effusion. 4. Left renal stone. 5. Aortic atherosclerosis (ICD10-I70.0). Coronary artery calcification. 6. Enlarged pulmonic trunk, indicative of pulmonary arterial hypertension    06/21/2022 Procedure   US guided liver right supraclvicular node biopsy showed Metastatic poorly differentiated carcinoma, favor squamous cell carcinoma, probably pulmonary origin.    06/28/2022 Cancer Staging   Staging form: Lung, AJCC 8th Edition - Clinical: Stage IVA (cT4, cN3, cM1a) - Signed by Rickard Patience, MD on 06/28/2022   07/14/2022 -  Chemotherapy   Patient is on Treatment Plan : LUNG NSCLC Pembrolizumab (200) q21d     09/29/2022 Imaging   CT chest abdomen pelvis w contrast showed Bilateral pulmonary nodules, some of which are waxing/waning, but favoring multifocal lung carcinoma/metastases.   Improving thoracic nodal metastases, as above.  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 Consider cystoscopy as clinically warranted.  Aortic Atherosclerosis (ICD10-I70.0) and Emphysema (ICD10-J43.9)     INTERVAL HISTORY Regina Perkins is a 82 y.o. female who has above history reviewed by me today presents for follow up visit for Lung squamous cell carcinoma She tolerates [place] well. Denies skin rash, diarrhea.  chronic SOB with exertion, improved she has been using her inhalers.  Patient reports feeling well.  No new complaints. Patient has been seen by urology.  Status post cystoscopy.  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  There is plan for  TURBT   MEDICAL HISTORY:  Past Medical History:  Diagnosis Date   Chronic kidney disease, stage 3a (HCC)    Diabetes mellitus without complication (HCC)    HTN (hypertension)    Hypertension    Type II diabetes mellitus with renal manifestations (HCC)     SURGICAL HISTORY: Past Surgical History:  Procedure Laterality Date   ABDOMINAL HYSTERECTOMY     BACK SURGERY     1988 and 1998   ESOPHAGOGASTRODUODENOSCOPY (EGD) WITH PROPOFOL N/A 07/29/2022   Procedure: ESOPHAGOGASTRODUODENOSCOPY (EGD) WITH PROPOFOL;  Surgeon: Wyline Mood, MD;  [facility]: [facility] ENDOSCOPY;  Service: Gastroenterology;  Laterality: N/A;    SOCIAL HISTORY: Social History   Socioeconomic History   Marital status: Widowed    Spouse name: Not on file   Number of children: Not on file   Years of education: Not on file   Highest education level: Not on file  Occupational History   Not on file  Tobacco Use   Smoking status: Every Day    Current packs/day: 0.50    Types: Cigarettes   Smokeless tobacco: Never  Vaping Use   Vaping status: Never Used  Substance and Sexual Activity   Alcohol use: No   Drug use: No   Sexual activity: Not on file  Other Topics Concern   Not on file  Social History Narrative   Not on file   Social Determinants of Health   Financial Resource Strain: Low Risk  (06/28/2022)   Overall Financial Resource Strain (CARDIA)    Difficulty of Paying Living Expenses: Not very hard  Food Insecurity: Food Insecurity Present (06/28/2022)   Hunger Vital Sign    Worried About Running Out of Food in the Last Year: Often true    Ran Out of Food in the Last Year: Often true  Transportation Needs: Unmet Transportation Needs (07/13/2022)   PRAPARE - Administrator, Civil Service (Medical): Yes    Lack of Transportation (Non-Medical): Yes  Physical Activity: Not on file  Stress: No Stress Concern Present (06/28/2022)   [employer] of Occupational Health - Occupational Stress  Questionnaire    Feeling of Stress : Not at all  Social Connections: Not on file  Intimate Partner Violence: Not At Risk (06/28/2022)   Humiliation, Afraid, Rape, and Kick questionnaire    Fear of Current or Ex-Partner: No    Emotionally Abused: No    Physically Abused: No    Sexually Abused: No    FAMILY HISTORY: Family History  Problem Relation Age of Onset   Asthma Mother    Heart disease Mother    Hypertension Mother    Diabetes Mother    Stroke Mother    Diabetes Sister    Diabetes Brother     ALLERGIES:  is allergic to ivp dye [iodinated contrast media] and shellfish allergy.  MEDICATIONS:  Current Outpatient Medications  Medication Sig Dispense Refill   albuterol (VENTOLIN HFA) 108 (90 Base) MCG/ACT inhaler Inhale 2 puffs into the lungs every 4 (four) hours as needed for wheezing or shortness of breath.     amLODipine (NORVASC) 10 MG tablet Take 10 mg by mouth daily.     ascorbic acid (VITAMIN C) 500 MG tablet Take 500 mg by mouth daily.     benzonatate (TESSALON) 200 MG capsule Take 1 capsule (200 mg total) by mouth 3 (three) times daily as needed for cough. 20 capsule    BREZTRI AEROSPHERE 160-9-4.8 MCG/ACT AERO Inhale into the lungs.     cyanocobalamin (VITAMIN B12) 1000 MCG tablet Take 1 tablet (1,000 mcg total) by mouth daily. 90 tablet 0   fluticasone (FLONASE) 50 MCG/ACT nasal spray Place 1 spray into both nostrils daily.     Fluticasone-Umeclidin-Vilant (TRELEGY ELLIPTA) 100-62.5-25 MCG/ACT AEPB Inhale into the lungs.     lisinopril (ZESTRIL) 40 MG tablet Take 40 mg by mouth daily.     metFORMIN (GLUCOPHAGE) 1000 MG tablet Take 1,000 mg by mouth 2 (two) times daily with a meal.     omeprazole (PRILOSEC) 40 MG capsule Take 1 capsule (40 mg total) by mouth in the morning and at bedtime. 60 capsule 2   prochlorperazine (COMPAZINE) 10 MG tablet Take 1 tablet (10 mg total) by mouth every 6 (six) hours as needed for nausea or vomiting. 30 tablet 1   cefUROXime (CEFTIN)  250 MG tablet Take 1 tablet (250 mg total) by mouth 2 (two) times daily with a meal. (Patient not taking: Reported on 12/08/2022) 14 tablet 0   furosemide (LASIX) 40 MG tablet Take 1 tablet (40 mg total) by mouth daily. 60 tablet 0   No current facility-administered medications for this visit.    Review of Systems  Constitutional:  Negative for appetite change, chills, fatigue and fever.  HENT:   Negative for hearing loss and voice change.   Eyes:  Negative for eye problems.  Respiratory:  Positive for shortness of breath. Negative for chest tightness and cough.   Cardiovascular:  Negative for chest pain.  Gastrointestinal:  Negative for abdominal distention, abdominal pain and blood in stool.  Endocrine:  Negative for hot flashes.  Genitourinary:  Negative for difficulty urinating and frequency.   Musculoskeletal:  Negative for arthralgias.  Skin:  Negative for itching and rash.  Neurological:  Negative for extremity weakness.  Hematological:  Negative for adenopathy.  Psychiatric/Behavioral:  Negative for confusion.      PHYSICAL EXAMINATION: ECOG PERFORMANCE STATUS: 2 - Symptomatic, <50% confined to bed  Vitals:   12/08/22 0921  BP: (!) 141/81  Pulse: 77  Resp: 18  Temp: 97.8 F (36.6 C)  SpO2: 100%   Filed Weights   12/08/22 0921  Weight: 116 lb 6.4 oz (52.8 kg)    Physical Exam Constitutional:      General: She is not in acute distress.    Appearance: She is not diaphoretic.  HENT:     Head: Normocephalic.  Eyes:     General: No scleral icterus.    Pupils: Pupils are equal, round, and reactive to light.  Cardiovascular:     Heart sounds: No murmur heard. Pulmonary:     Effort: Pulmonary effort is normal. No respiratory distress.     Comments: Decreased breath sounds bilaterally Abdominal:     General: There is no distension.  Musculoskeletal:        General: Normal range of motion.     Cervical back: Normal range of motion.  Skin:    Findings: No  erythema.  Neurological:     Mental Status: She is alert and oriented to person, place, and time. Mental status is at baseline.     Cranial Nerves: No cranial nerve deficit.     Motor: No abnormal muscle tone.  Psychiatric:        Mood and Affect: Affect normal.      LABORATORY DATA:  I have reviewed the data as listed    Latest Ref Rng & Units 12/08/2022    8:47 AM 11/17/2022    1:12 PM 10/27/2022   10:00 AM  CBC  WBC 4.0 - 10.5 K/uL 6.6  8.4  8.5   Hemoglobin 12.0 - 15.0 g/dL 16.1  09.6  04.5   Hematocrit 36.0 - 46.0 % 35.0  35.5  36.9   Platelets 150 - 400 K/uL 282  244  287       Latest Ref Rng & Units 12/08/2022    8:47 AM 11/17/2022    1:12 PM 10/27/2022   10:00 AM  CMP  Glucose 70 - 99 mg/dL 409  811  914   BUN 8 - 23 mg/dL 32  32  29   Creatinine 0.44 - 1.00 mg/dL 7.82  9.56  2.13   Sodium 135 - 145 mmol/L 135  135  131   Potassium 3.5 - 5.1 mmol/L 3.5  4.5  4.0   Chloride 98 - 111 mmol/L 101  101  98   CO2 22 - 32 mmol/L 26  27  25    Calcium 8.9 - 10.3 mg/dL 9.0  9.0  9.1   Total Protein 6.5 - 8.1 g/dL 6.8  6.8  7.1   Total Bilirubin 0.3 - 1.2 mg/dL 0.5  0.2  0.5   Alkaline Phos 38 - 126 U/L 58  58  60   AST 15 - 41 U/L 32  26  30   ALT 0 - 44 U/L 29  21  25       RADIOGRAPHIC STUDIES: I have personally reviewed the radiological images as listed and agreed with the findings in the report. No results found.

## 2022-12-08 NOTE — Assessment & Plan Note (Addendum)
pending urology evaluation - TURBT

## 2022-12-08 NOTE — Assessment & Plan Note (Signed)
Immunotherapy treatment plan as listed above. 

## 2022-12-08 NOTE — Assessment & Plan Note (Signed)
Non-small cell lung cancer, favor squamous cell carcinoma, stage IV- TPS 90%, TMB 10, KRAS G12V MRI brain with and without contrast -  Left occipital focus of prior hemorrhage with mild linear contrast enhancement. Possible small metastasis or microhemmorhagic event.  TPS 90%, She tolerates immunotherapy CT scan showed mixed response. Clinically she is doing better Recommend to continue current regimen.  Labs are reviewed and discussed with patient.  Proceed with Keytruda  Brain lesion, repeat MRI in May 2024 shows no brain metastatic disease.

## 2022-12-08 NOTE — Progress Notes (Signed)
Nutrition Follow-up:  Patient with stage IV lung cancer.  Now with urothelial cancer.  Planning TURBT on 8/27 and EGD on 8/28.  Patient receiving keytruda  Met with patient during infusion.  Eating potato chips.  Reports that her appetite has been good.  She is eating 2 meals a day. Drinking ensure shakes (350 calories) 3 times a day.     Medications: reviewed  Labs: reviewed  Anthropometrics:   Weight 116 lb 6.4 oz today 115 lb 1.6 oz on 6/12 118 lb 1.6 oz on 5/1 116 lb 14.4 oz on 4/9 115 lb on 3/15   NUTRITION DIAGNOSIS: Inadequate oral intake stable    INTERVENTION:  Encouraged high calorie, high protein foods Continue 350+ calorie shakes TID    MONITORING, EVALUATION, GOAL: weight trends, intake   NEXT VISIT: Wednesday, Sept 25 during infusion   B. Freida Busman, RD, LDN Registered Dietitian [phone]

## 2022-12-08 NOTE — Progress Notes (Signed)
Pt here for follow up. Pt completed antibiotics for bladder infections.

## 2022-12-08 NOTE — Patient Instructions (Signed)

## 2022-12-08 NOTE — Assessment & Plan Note (Signed)
 She has been off  Elqiuis 5mg  BID due to GI bleeding

## 2022-12-13 ENCOUNTER — Other Ambulatory Visit: Payer: Self-pay

## 2022-12-13 ENCOUNTER — Encounter
Admission: RE | Admit: 2022-12-13 | Discharge: 2022-12-13 | Disposition: A | Discharge status: Home / Self Care | Payer: Medicare Other | Source: Ambulatory Visit | Attending: Urology | Admitting: Urology

## 2022-12-13 VITALS — Ht 65.0 in | Wt 116.0 lb

## 2022-12-13 DIAGNOSIS — E1129 Type 2 diabetes mellitus with other diabetic kidney complication: Secondary | ICD-10-CM

## 2022-12-13 HISTORY — DX: Gastrointestinal hemorrhage, unspecified: K92.2

## 2022-12-13 HISTORY — DX: Hypomagnesemia: E83.42

## 2022-12-13 HISTORY — DX: Sepsis, unspecified organism: A41.9

## 2022-12-13 HISTORY — DX: Other pulmonary embolism without acute cor pulmonale: I26.99

## 2022-12-13 HISTORY — DX: Unspecified protein-calorie malnutrition: E46

## 2022-12-13 HISTORY — DX: Atherosclerosis of native arteries of extremities with intermittent claudication, unspecified extremity: I70.219

## 2022-12-13 HISTORY — DX: Hypokalemia: E87.6

## 2022-12-13 HISTORY — DX: Malignant neoplasm of unspecified part of unspecified bronchus or lung: C34.90

## 2022-12-13 HISTORY — DX: Elevated white blood cell count, unspecified: D72.829

## 2022-12-13 HISTORY — DX: Unspecified glaucoma: H40.9

## 2022-12-13 HISTORY — DX: Other specified disorders of bladder: N32.89

## 2022-12-13 HISTORY — DX: Anemia, unspecified: D64.9

## 2022-12-13 HISTORY — DX: Other pericardial effusion (noninflammatory): I31.39

## 2022-12-13 NOTE — Patient Instructions (Addendum)
Your procedure is scheduled on: Tuesday 12/21/22 To find out your arrival time, please call [phone] between 1PM - 3PM on: Monday 12/20/22   Report to the Registration Desk on the 1st floor of the [facility]. Free Valet parking is available.  If your arrival time is 6:00 am, do not arrive before that time as the [facility] entrance doors do not open until 6:00 am.  REMEMBER: Instructions that are not followed completely may result in serious medical risk, up to and including death; or upon the discretion of your surgeon and anesthesiologist your surgery may need to be rescheduled.  Do not eat food or drink any liquids after midnight the night before surgery.  No gum chewing or hard candies.  One week prior to surgery: Stop Anti-inflammatories (NSAIDS) such as Advil, Aleve, Ibuprofen, Motrin, Naproxen, Naprosyn and Aspirin based products such as Excedrin, Goody's Powder, BC Powder. You may however, continue to take Tylenol if needed for pain up until the day of surgery.  Stop ANY OVER THE COUNTER supplements or vitamins TODAY (12/13/22) until after surgery.  Continue taking all prescribed medications as except: Metformin, hold for 2 days before surgery. Last dose Saturday night 12/21/22.  TAKE ONLY THESE MEDICATIONS THE MORNING OF SURGERY WITH A SIP OF WATER:  amLODipine (NORVASC) 10 MG tablet  omeprazole (PRILOSEC) 40 MG capsule   Use inhalers on the day of surgery and bring to the hospital.  No Alcohol for 24 hours before or after surgery.  No Smoking including e-cigarettes for 24 hours before surgery.  No chewable tobacco products for at least 6 hours before surgery.  No nicotine patches on the day of surgery.  Do not use any "recreational" drugs for at least a week (preferably 2 weeks) before your surgery.  Please be advised that the combination of cocaine and anesthesia may have negative outcomes, up to and including death. If you test positive for cocaine, your surgery  will be cancelled.  On the morning of surgery brush your teeth with toothpaste and water, you may rinse your mouth with mouthwash if you wish. Do not swallow any toothpaste or mouthwash.  Use CHG Soap or wipes as directed on instruction sheet. Shower with your regular soap.  Do not wear lotions, powders, or perfumes.   Do not shave body hair from the neck down 48 hours before surgery.  Wear comfortable clothing (specific to your surgery type) to the hospital.  Do not wear jewelry, make-up, hairpins, clips or nail polish.  Contact lenses, hearing aids and dentures may not be worn into surgery.  Do not bring valuables to the hospital. [facility] is not responsible for any missing/lost belongings or valuables.   Notify your doctor if there is any change in your medical condition (cold, fever, infection).  If you are being discharged the day of surgery, you will not be allowed to drive home. You will need a responsible individual to drive you home and stay with you for 24 hours after surgery.   If you are taking public transportation, you will need to have a responsible individual with you.  If you are being admitted to the hospital overnight, leave your suitcase in the car. After surgery it may be brought to your room.  In case of increased patient census, it may be necessary for you, the patient, to continue your postoperative care in the Same Day Surgery department.  After surgery, you can help prevent lung complications by doing breathing exercises.  Take deep  breaths and cough every 1-2 hours. Your doctor may order a device called an Incentive Spirometer to help you take deep breaths. When coughing or sneezing, hold a pillow firmly against your incision with both hands. This is called "splinting." Doing this helps protect your incision. It also decreases belly discomfort.  Surgery Visitation Policy:  Patients undergoing a surgery or procedure may have two family members or  support persons with them as long as the person is not COVID-19 positive or experiencing its symptoms.   Inpatient Visitation:    Visiting hours are 7 a.m. to 8 p.m. Up to four visitors are allowed at one time in a patient room. The visitors may rotate out with other people during the day. One designated support person (adult) may remain overnight.  Please call the Pre-admissions Testing Dept. at [phone] if you have any questions about these instructions.

## 2022-12-20 MED ORDER — GENTAMICIN SULFATE 40 MG/ML IJ SOLN
5.0000 mg/kg | INTRAVENOUS | Status: AC
  Administered 2022-12-21: 260 mg via INTRAVENOUS
  Filled 2022-12-20: qty 6.5

## 2022-12-20 MED ORDER — CHLORHEXIDINE GLUCONATE 0.12 % MT SOLN
15.0000 mL | Freq: Once | OROMUCOSAL | Status: AC
  Administered 2022-12-21: 15 mL via OROMUCOSAL

## 2022-12-20 MED ORDER — ORAL CARE MOUTH RINSE: 15.0000 mL | Freq: Once | OROMUCOSAL | Status: AC

## 2022-12-20 MED ORDER — SODIUM CHLORIDE 0.9 % IV SOLN: INTRAVENOUS | Status: DC

## 2022-12-21 ENCOUNTER — Encounter: Payer: Self-pay | Admitting: Urology

## 2022-12-21 ENCOUNTER — Ambulatory Visit: Payer: Medicare Other

## 2022-12-21 ENCOUNTER — Ambulatory Visit (HOSPITAL_COMMUNITY)
Admission: RE | Admit: 2022-12-21 | Discharge: 2022-12-21 | Disposition: A | Discharge status: Home / Self Care | Payer: Medicare Other | Source: Home / Self Care | Attending: Urology | Admitting: Urology

## 2022-12-21 ENCOUNTER — Ambulatory Visit: Payer: Medicare Other | Admitting: Urgent Care

## 2022-12-21 ENCOUNTER — Inpatient Hospital Stay
Admission: EM | Admit: 2022-12-21 | Discharge: 2022-12-25 | DRG: 669 | Disposition: A | Discharge status: Home / Self Care | Payer: Medicare Other | Attending: Internal Medicine | Admitting: Internal Medicine

## 2022-12-21 ENCOUNTER — Encounter
Admission: RE | Disposition: A | Discharge status: Home / Self Care | Payer: Self-pay | Source: Home / Self Care | Attending: Urology

## 2022-12-21 ENCOUNTER — Other Ambulatory Visit: Payer: Self-pay

## 2022-12-21 DIAGNOSIS — C679 Malignant neoplasm of bladder, unspecified: Secondary | ICD-10-CM

## 2022-12-21 DIAGNOSIS — I160 Hypertensive urgency: Secondary | ICD-10-CM | POA: Diagnosis present

## 2022-12-21 DIAGNOSIS — E785 Hyperlipidemia, unspecified: Secondary | ICD-10-CM | POA: Diagnosis present

## 2022-12-21 DIAGNOSIS — Z86711 Personal history of pulmonary embolism: Secondary | ICD-10-CM

## 2022-12-21 DIAGNOSIS — Z6820 Body mass index (BMI) 20.0-20.9, adult: Secondary | ICD-10-CM

## 2022-12-21 DIAGNOSIS — C7800 Secondary malignant neoplasm of unspecified lung: Secondary | ICD-10-CM | POA: Insufficient documentation

## 2022-12-21 DIAGNOSIS — C674 Malignant neoplasm of posterior wall of bladder: Secondary | ICD-10-CM | POA: Diagnosis not present

## 2022-12-21 DIAGNOSIS — T83018A Breakdown (mechanical) of other indwelling urethral catheter, initial encounter: Secondary | ICD-10-CM

## 2022-12-21 DIAGNOSIS — D62 Acute posthemorrhagic anemia: Secondary | ICD-10-CM | POA: Diagnosis not present

## 2022-12-21 DIAGNOSIS — J449 Chronic obstructive pulmonary disease, unspecified: Secondary | ICD-10-CM | POA: Diagnosis present

## 2022-12-21 DIAGNOSIS — C678 Malignant neoplasm of overlapping sites of bladder: Secondary | ICD-10-CM

## 2022-12-21 DIAGNOSIS — F17210 Nicotine dependence, cigarettes, uncomplicated: Secondary | ICD-10-CM | POA: Diagnosis present

## 2022-12-21 DIAGNOSIS — K219 Gastro-esophageal reflux disease without esophagitis: Secondary | ICD-10-CM | POA: Diagnosis present

## 2022-12-21 DIAGNOSIS — E860 Dehydration: Secondary | ICD-10-CM | POA: Diagnosis not present

## 2022-12-21 DIAGNOSIS — I129 Hypertensive chronic kidney disease with stage 1 through stage 4 chronic kidney disease, or unspecified chronic kidney disease: Secondary | ICD-10-CM | POA: Insufficient documentation

## 2022-12-21 DIAGNOSIS — Z7984 Long term (current) use of oral hypoglycemic drugs: Secondary | ICD-10-CM

## 2022-12-21 DIAGNOSIS — E1122 Type 2 diabetes mellitus with diabetic chronic kidney disease: Secondary | ICD-10-CM | POA: Insufficient documentation

## 2022-12-21 DIAGNOSIS — C801 Malignant (primary) neoplasm, unspecified: Secondary | ICD-10-CM | POA: Insufficient documentation

## 2022-12-21 DIAGNOSIS — C7911 Secondary malignant neoplasm of bladder: Secondary | ICD-10-CM | POA: Insufficient documentation

## 2022-12-21 DIAGNOSIS — Z833 Family history of diabetes mellitus: Secondary | ICD-10-CM

## 2022-12-21 DIAGNOSIS — E1129 Type 2 diabetes mellitus with other diabetic kidney complication: Secondary | ICD-10-CM

## 2022-12-21 DIAGNOSIS — N1831 Chronic kidney disease, stage 3a: Secondary | ICD-10-CM | POA: Insufficient documentation

## 2022-12-21 DIAGNOSIS — Z8249 Family history of ischemic heart disease and other diseases of the circulatory system: Secondary | ICD-10-CM

## 2022-12-21 DIAGNOSIS — I70219 Atherosclerosis of native arteries of extremities with intermittent claudication, unspecified extremity: Secondary | ICD-10-CM | POA: Diagnosis present

## 2022-12-21 DIAGNOSIS — D494 Neoplasm of unspecified behavior of bladder: Secondary | ICD-10-CM

## 2022-12-21 DIAGNOSIS — E876 Hypokalemia: Secondary | ICD-10-CM | POA: Diagnosis present

## 2022-12-21 DIAGNOSIS — Z85118 Personal history of other malignant neoplasm of bronchus and lung: Secondary | ICD-10-CM

## 2022-12-21 DIAGNOSIS — N179 Acute kidney failure, unspecified: Secondary | ICD-10-CM | POA: Diagnosis present

## 2022-12-21 DIAGNOSIS — E440 Moderate protein-calorie malnutrition: Secondary | ICD-10-CM | POA: Diagnosis present

## 2022-12-21 DIAGNOSIS — Z91041 Radiographic dye allergy status: Secondary | ICD-10-CM

## 2022-12-21 DIAGNOSIS — R310 Gross hematuria: Secondary | ICD-10-CM | POA: Diagnosis present

## 2022-12-21 DIAGNOSIS — Y846 Urinary catheterization as the cause of abnormal reaction of the patient, or of later complication, without mention of misadventure at the time of the procedure: Secondary | ICD-10-CM | POA: Diagnosis not present

## 2022-12-21 DIAGNOSIS — R000 Tachycardia, unspecified: Secondary | ICD-10-CM | POA: Diagnosis not present

## 2022-12-21 DIAGNOSIS — T83038A Leakage of other indwelling urethral catheter, initial encounter: Secondary | ICD-10-CM | POA: Diagnosis not present

## 2022-12-21 DIAGNOSIS — Z91013 Allergy to seafood: Secondary | ICD-10-CM

## 2022-12-21 DIAGNOSIS — N99820 Postprocedural hemorrhage and hematoma of a genitourinary system organ or structure following a genitourinary system procedure: Secondary | ICD-10-CM | POA: Diagnosis not present

## 2022-12-21 DIAGNOSIS — Z90710 Acquired absence of both cervix and uterus: Secondary | ICD-10-CM

## 2022-12-21 DIAGNOSIS — E1165 Type 2 diabetes mellitus with hyperglycemia: Secondary | ICD-10-CM | POA: Diagnosis present

## 2022-12-21 HISTORY — PX: CYSTOSCOPY W/ RETROGRADES: SHX1426

## 2022-12-21 HISTORY — DX: Malignant neoplasm of bladder, unspecified: C67.9

## 2022-12-21 HISTORY — PX: TRANSURETHRAL RESECTION OF BLADDER TUMOR: SHX2575

## 2022-12-21 LAB — CBC WITH DIFFERENTIAL/PLATELET
Abs Immature Granulocytes: 0.04 10*3/uL (ref 0.00–0.07)
Basophils Absolute: 0 10*3/uL (ref 0.0–0.1)
Basophils Relative: 0 %
Eosinophils Absolute: 0 10*3/uL (ref 0.0–0.5)
Eosinophils Relative: 0 %
HCT: 35.1 % — ABNORMAL LOW (ref 36.0–46.0)
Hemoglobin: 11.1 g/dL — ABNORMAL LOW (ref 12.0–15.0)
Immature Granulocytes: 1 %
Lymphocytes Relative: 11 %
Lymphs Abs: 0.9 10*3/uL (ref 0.7–4.0)
MCH: 28.4 pg (ref 26.0–34.0)
MCHC: 31.6 g/dL (ref 30.0–36.0)
MCV: 89.8 fL (ref 80.0–100.0)
Monocytes Absolute: 0.2 10*3/uL (ref 0.1–1.0)
Monocytes Relative: 2 %
Neutro Abs: 7.4 10*3/uL (ref 1.7–7.7)
Neutrophils Relative %: 86 %
Platelets: 306 10*3/uL (ref 150–400)
RBC: 3.91 MIL/uL (ref 3.87–5.11)
RDW: 18 % — ABNORMAL HIGH (ref 11.5–15.5)
WBC: 8.5 10*3/uL (ref 4.0–10.5)
nRBC: 0 % (ref 0.0–0.2)

## 2022-12-21 LAB — COMPREHENSIVE METABOLIC PANEL
ALT: 22 U/L (ref 0–44)
AST: 31 U/L (ref 15–41)
Albumin: 3.7 g/dL (ref 3.5–5.0)
Alkaline Phosphatase: 57 U/L (ref 38–126)
Anion gap: 13 (ref 5–15)
BUN: 24 mg/dL — ABNORMAL HIGH (ref 8–23)
CO2: 19 mmol/L — ABNORMAL LOW (ref 22–32)
Calcium: 8.4 mg/dL — ABNORMAL LOW (ref 8.9–10.3)
Chloride: 100 mmol/L (ref 98–111)
Creatinine, Ser: 1.29 mg/dL — ABNORMAL HIGH (ref 0.44–1.00)
GFR, Estimated: 42 mL/min — ABNORMAL LOW (ref >60)
Glucose, Bld: 297 mg/dL — ABNORMAL HIGH (ref 70–99)
Potassium: 3.6 mmol/L (ref 3.5–5.1)
Sodium: 132 mmol/L — ABNORMAL LOW (ref 135–145)
Total Bilirubin: 0.4 mg/dL (ref 0.3–1.2)
Total Protein: 6.9 g/dL (ref 6.5–8.1)

## 2022-12-21 LAB — GLUCOSE, CAPILLARY
Glucose-Capillary: 104 mg/dL — ABNORMAL HIGH (ref 70–99)
Glucose-Capillary: 121 mg/dL — ABNORMAL HIGH (ref 70–99)

## 2022-12-21 SURGERY — TURBT (TRANSURETHRAL RESECTION OF BLADDER TUMOR)
Anesthesia: General

## 2022-12-21 MED ORDER — LABETALOL HCL 5 MG/ML IV SOLN
INTRAVENOUS | Status: AC
  Filled 2022-12-21: qty 4

## 2022-12-21 MED ORDER — IOHEXOL 180 MG/ML  SOLN
INTRAMUSCULAR | Status: DC | PRN
  Administered 2022-12-21 (×2): 20 mL

## 2022-12-21 MED ORDER — PROPOFOL 10 MG/ML IV BOLUS
INTRAVENOUS | Status: DC | PRN
Start: 2022-12-21 — End: 2022-12-21
  Administered 2022-12-21 (×2): 20 mg via INTRAVENOUS
  Administered 2022-12-21: 80 mg via INTRAVENOUS
  Administered 2022-12-21: 40 mg via INTRAVENOUS
  Administered 2022-12-21: 30 mg via INTRAVENOUS
  Administered 2022-12-21: 40 mg via INTRAVENOUS

## 2022-12-21 MED ORDER — TROSPIUM CHLORIDE 20 MG PO TABS: 20.0000 mg | ORAL_TABLET | Freq: Two times a day (BID) | ORAL | 0 refills | Status: DC | PRN

## 2022-12-21 MED ORDER — DEXMEDETOMIDINE HCL IN NACL 80 MCG/20ML IV SOLN
INTRAVENOUS | Status: DC | PRN
  Administered 2022-12-21: 8 ug via INTRAVENOUS
  Administered 2022-12-21: 12 ug via INTRAVENOUS
  Administered 2022-12-21: 4 ug via INTRAVENOUS

## 2022-12-21 MED ORDER — OXYCODONE HCL 5 MG/5ML PO SOLN: 5.0000 mg | Freq: Once | ORAL | Status: DC | PRN

## 2022-12-21 MED ORDER — FENTANYL CITRATE (PF) 100 MCG/2ML IJ SOLN
INTRAMUSCULAR | Status: AC
  Filled 2022-12-21: qty 2

## 2022-12-21 MED ORDER — PROPOFOL 10 MG/ML IV BOLUS
INTRAVENOUS | Status: AC
  Filled 2022-12-21: qty 20

## 2022-12-21 MED ORDER — OXYCODONE HCL 5 MG PO TABS: 5.0000 mg | ORAL_TABLET | Freq: Once | ORAL | Status: DC | PRN

## 2022-12-21 MED ORDER — SODIUM CHLORIDE 0.9 % IR SOLN
3000.0000 mL | Status: DC
  Administered 2022-12-22 (×2): 3000 mL

## 2022-12-21 MED ORDER — LIDOCAINE HCL (CARDIAC) PF 100 MG/5ML IV SOSY
PREFILLED_SYRINGE | INTRAVENOUS | Status: DC | PRN
  Administered 2022-12-21: 50 mg via INTRAVENOUS

## 2022-12-21 MED ORDER — PHENYLEPHRINE 80 MCG/ML (10ML) SYRINGE FOR IV PUSH (FOR BLOOD PRESSURE SUPPORT)
PREFILLED_SYRINGE | INTRAVENOUS | Status: AC
  Filled 2022-12-21: qty 10

## 2022-12-21 MED ORDER — ONDANSETRON HCL 4 MG/2ML IJ SOLN
INTRAMUSCULAR | Status: DC | PRN
  Administered 2022-12-21: 4 mg via INTRAVENOUS

## 2022-12-21 MED ORDER — ROCURONIUM BROMIDE 10 MG/ML (PF) SYRINGE
PREFILLED_SYRINGE | INTRAVENOUS | Status: AC
  Filled 2022-12-21: qty 10

## 2022-12-21 MED ORDER — FENTANYL CITRATE (PF) 100 MCG/2ML IJ SOLN: 25.0000 ug | INTRAMUSCULAR | Status: DC | PRN

## 2022-12-21 MED ORDER — CHLORHEXIDINE GLUCONATE 0.12 % MT SOLN
OROMUCOSAL | Status: AC
  Filled 2022-12-21: qty 15

## 2022-12-21 MED ORDER — ROCURONIUM BROMIDE 100 MG/10ML IV SOLN
INTRAVENOUS | Status: DC | PRN
  Administered 2022-12-21: 45 mg via INTRAVENOUS
  Administered 2022-12-21: 15 mg via INTRAVENOUS

## 2022-12-21 MED ORDER — DEXAMETHASONE SODIUM PHOSPHATE 10 MG/ML IJ SOLN
INTRAMUSCULAR | Status: AC
  Filled 2022-12-21: qty 1

## 2022-12-21 MED ORDER — OXYCODONE-ACETAMINOPHEN 5-325 MG PO TABS
1.0000 | ORAL_TABLET | Freq: Once | ORAL | Status: AC
  Administered 2022-12-22: 1 via ORAL
  Filled 2022-12-21: qty 1

## 2022-12-21 MED ORDER — ACETAMINOPHEN 10 MG/ML IV SOLN
INTRAVENOUS | Status: AC
  Filled 2022-12-21: qty 100

## 2022-12-21 MED ORDER — PHENYLEPHRINE HCL (PRESSORS) 10 MG/ML IV SOLN
INTRAVENOUS | Status: DC | PRN
  Administered 2022-12-21: 160 ug via INTRAVENOUS
  Administered 2022-12-21: 80 ug via INTRAVENOUS
  Administered 2022-12-21 (×2): 120 ug via INTRAVENOUS
  Administered 2022-12-21: 40 ug via INTRAVENOUS

## 2022-12-21 MED ORDER — SUGAMMADEX SODIUM 500 MG/5ML IV SOLN
INTRAVENOUS | Status: DC | PRN
  Administered 2022-12-21: 200 mg via INTRAVENOUS

## 2022-12-21 MED ORDER — FENTANYL CITRATE (PF) 100 MCG/2ML IJ SOLN
INTRAMUSCULAR | Status: DC | PRN
  Administered 2022-12-21 (×4): 25 ug via INTRAVENOUS

## 2022-12-21 MED ORDER — ONDANSETRON HCL 4 MG/2ML IJ SOLN
INTRAMUSCULAR | Status: AC
  Filled 2022-12-21: qty 2

## 2022-12-21 MED ORDER — SODIUM CHLORIDE 0.9 % IR SOLN
Status: DC | PRN
  Administered 2022-12-21 (×9): 3000 mL via INTRAVESICAL
  Administered 2022-12-21: 6000 mL via INTRAVESICAL
  Administered 2022-12-21 (×2): 3000 mL via INTRAVESICAL

## 2022-12-21 MED ORDER — ACETAMINOPHEN 10 MG/ML IV SOLN
INTRAVENOUS | Status: DC | PRN
  Administered 2022-12-21: 1000 mg via INTRAVENOUS

## 2022-12-21 MED ORDER — DEXAMETHASONE SODIUM PHOSPHATE 10 MG/ML IJ SOLN
INTRAMUSCULAR | Status: DC | PRN
  Administered 2022-12-21: 5 mg via INTRAVENOUS

## 2022-12-21 SURGICAL SUPPLY — 23 items
BAG DRAIN SIEMENS DORNER NS (MISCELLANEOUS) ×2 IMPLANT
BAG DRN NS LF (MISCELLANEOUS) ×2
BRUSH SCRUB EZ 4% CHG (MISCELLANEOUS) ×2 IMPLANT
CATH FOL 2WAY LX 20X30 (CATHETERS) IMPLANT
CATH URETL OPEN END 6X70 (CATHETERS) ×2 IMPLANT
DRSG TELFA 3X4 N-ADH STERILE (GAUZE/BANDAGES/DRESSINGS) ×2 IMPLANT
GLOVE BIOGEL PI IND STRL 7.5 (GLOVE) ×2 IMPLANT
GOWN STRL REUS W/ TWL LRG LVL3 (GOWN DISPOSABLE) ×2 IMPLANT
GOWN STRL REUS W/ TWL XL LVL3 (GOWN DISPOSABLE) ×2 IMPLANT
GOWN STRL REUS W/TWL LRG LVL3 (GOWN DISPOSABLE) ×2
GOWN STRL REUS W/TWL XL LVL3 (GOWN DISPOSABLE) ×2
GOWN STRL REUS W/TWL XL LVL4 (GOWN DISPOSABLE) ×2 IMPLANT
GUIDEWIRE STR DUAL SENSOR (WIRE) ×2 IMPLANT
IV NS IRRIG 3000ML ARTHROMATIC (IV SOLUTION) ×4 IMPLANT
KIT TURNOVER CYSTO (KITS) ×2 IMPLANT
LOOP CUT BIPOLAR 24F LRG (ELECTROSURGICAL) IMPLANT
PACK CYSTO AR (MISCELLANEOUS) ×2 IMPLANT
SET CYSTO W/LG BORE CLAMP LF (SET/KITS/TRAYS/PACK) ×2 IMPLANT
SET IRRIG Y TYPE TUR BLADDER L (SET/KITS/TRAYS/PACK) ×2 IMPLANT
SURGILUBE 2OZ TUBE FLIPTOP (MISCELLANEOUS) ×2 IMPLANT
SYR TOOMEY IRRIG 70ML (MISCELLANEOUS) ×2
SYRINGE TOOMEY IRRIG 70ML (MISCELLANEOUS) ×2 IMPLANT
WATER STERILE IRR 500ML POUR (IV SOLUTION) ×2 IMPLANT

## 2022-12-21 NOTE — Anesthesia Procedure Notes (Signed)
Procedure Name: Intubation Date/Time: 12/21/2022 11:46 AM  Performed by: Jeannene Patella, CRNAPre-anesthesia Checklist: Patient identified, Emergency Drugs available, Suction available, Patient being monitored and Timeout performed Patient Re-evaluated:Patient Re-evaluated prior to induction Oxygen Delivery Method: Circle system utilized Preoxygenation: Pre-oxygenation with 100% oxygen Ventilation: Mask ventilation without difficulty and Oral airway inserted - appropriate to patient size Laryngoscope Size: McGraph and 4 Grade View: Grade II Tube type: Oral Tube size: 7.0 mm Number of attempts: 1 Airway Equipment and Method: Stylet, Video-laryngoscopy and LTA kit utilized Placement Confirmation: ETT inserted through vocal cords under direct vision, positive ETCO2 and breath sounds checked- equal and bilateral Secured at: 21 (at lip) cm Tube secured with: Tape Dental Injury: Teeth and Oropharynx as per pre-operative assessment  Comments: edentulous

## 2022-12-21 NOTE — ED Provider Notes (Signed)
-----------------------------------------   11:57 PM on 12/21/2022 -----------------------------------------  Assuming care from Dr. Scotty Court.  In short, Regina Perkins is a 82 y.o. female with a chief complaint of hematuria and malfunctioning indwelling Foley catheter.  Refer to the original H&P for additional details.  The current plan of care is to follow up after CBI to determine if patient can be discharged.   Clinical Course as of 12/22/22 0201  Wed Dec 22, 2022  2694 Patient's heart rate has gone up to the 120s.  Her urine via continuous bladder irrigation, which had turned to just barely light pink, has now become grossly bloody again.  I am concerned about ongoing bleeding and possible volume depletion leading to tachycardia.  I ordered a type and screen and ordered LR 1 L IV bolus.  Paging Dr. Lonna Cobb with urology who also did her procedure today to discuss. [CF]  8546 Consulted with Dr. Lonna Cobb with urology.  He agreed that given the patient's ongoing tachycardia and ongoing hematuria despite CBI, the patient should be admitted for serial H&H.  He asked the patient remains n.p.o. in case he has to take her back to the OR.  I updated the patient and she agrees that she feels too weak to go home regardless.  Will continue CBI, and she is feeling nauseated now so ordered Zofran for milligrams IV, and I am consulting the hospitalist. [CF]  0138 Consulted with Dr. Arville Care with the hospitalist service who will admit [CF]    Clinical Course User Index [CF] Loleta Rose, MD     Medications  sodium chloride irrigation 0.9 % 3,000 mL (0 mLs Irrigation Stopped 12/22/22 0027)  oxyCODONE-acetaminophen (PERCOCET/ROXICET) 5-325 MG per tablet 1 tablet (1 tablet Oral Given 12/22/22 0016)  lactated ringers bolus 1,000 mL (1,000 mLs Intravenous New Bag/Given 12/22/22 0121)  ondansetron (ZOFRAN) injection 4 mg (4 mg Intravenous Given 12/22/22 0139)     ED Discharge Orders     None      Final  diagnoses:  Gross hematuria  Urinary catheter dysfunction, initial encounter (HCC)  Tachycardia     Loleta Rose, MD 12/22/22 0201

## 2022-12-21 NOTE — Interval H&P Note (Signed)
History and Physical Interval Note:  12/21/2022 11:31 AM  Regina Perkins  has presented today for surgery, with the diagnosis of Bladder Tumor.  The various methods of treatment have been discussed with the patient and family. After consideration of risks, benefits and other options for treatment, the patient has consented to  Procedure(s): TRANSURETHRAL RESECTION OF BLADDER TUMOR (TURBT) (N/A) CYSTOSCOPY WITH RETROGRADE PYELOGRAM (Bilateral) as a surgical intervention.  The patient's history has been reviewed, patient examined, no change in status, stable for surgery.  I have reviewed the patient's chart and labs.  Questions were answered to the patient's satisfaction.    CV:RRR Lungs:clear  [name]

## 2022-12-21 NOTE — Anesthesia Postprocedure Evaluation (Signed)
Anesthesia Post Note  Patient: Regina Perkins  Procedure(s) Performed: TRANSURETHRAL RESECTION OF BLADDER TUMOR (TURBT) CYSTOSCOPY WITH RETROGRADE PYELOGRAM (Bilateral)  Patient location during evaluation: PACU Anesthesia Type: General Level of consciousness: awake and alert Pain management: pain level controlled Vital Signs Assessment: post-procedure vital signs reviewed and stable Respiratory status: spontaneous breathing, nonlabored ventilation, respiratory function stable and patient connected to nasal cannula oxygen Cardiovascular status: blood pressure returned to baseline and stable Postop Assessment: no apparent nausea or vomiting Anesthetic complications: no  No notable events documented.   Last Vitals:  Vitals:   12/21/22 1050 12/21/22 1430  BP: (!) 146/81 (!) 178/78  Pulse: 89 73  Resp: 16 18  Temp: 37.1 C (!) 36.3 C  SpO2: 99% 100%    Last Pain:  Vitals:   12/21/22 1430  TempSrc:   PainSc: Asleep                 Stephanie Coup

## 2022-12-21 NOTE — Op Note (Signed)
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  Using resectoscope measurement anterior-posterior extent 5.5-6 cm.  1 cm satellite tumor mid posterior wall.  Endoscopically suspicious for muscle invasive disease Right ureteroscopy with tumor involvement right distal ureter for ~ 10 mm.  Remainder of ureter normal in appearance up to UPJ.  Distal ureteral tumor was not treated. Left retrograde pyelogram-no filling defect or hydronephrosis; mild pyelovenous backflow and Right retrograde pyelogram-no filling defects collecting system  EBL: Minimal  Specimens:  Bladder tumor Base of bladder tumor  Indication: Regina Perkins is a 82 y.o. 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  Office cystoscopy remarkable for large papillary tumor.  After reviewing the management options for treatment, he elected to proceed with the above surgical procedure(s). We have discussed the potential benefits and risks of the procedure, side effects of the proposed treatment, the likelihood of the patient achieving the goals of the procedure, and any potential problems that might occur during the procedure or recuperation. Informed consent has been obtained.  Description of procedure:  The patient was taken to the operating room and general anesthesia was induced.  The patient was placed in the dorsal lithotomy position, prepped and draped in the usual sterile fashion, and preoperative antibiotics were administered. A  preoperative time-out was performed.   The urethral meatus would not except a 21 French cystoscope sheath with obturator and was dilated with Walther sounds from 14-24F.  The cystoscope sheath with obturator was then placed into the bladder and replaced with a 30 degree lens.  Panendoscopy was performed with findings as described above.  The left UO was normal in appearance and the right UO was not visualized obscured by tumor.  The cystoscope was removed and a 35F resectoscope sheath with obturator was lubricated and inserted into the bladder.  The obturator was replaced with an Regina Perkins resectoscope.  Tumor extent was measured as described above.    The tumor was then resected starting on the anterolateral aspect working medially towards the expected location of the ureteral orifice.  Hemostasis was obtained with cautery.  On the medial aspect of the right hemitrigone a resected UO was identified with papillary tumor present.  Once the anterior aspect of the tumor was resected the remainder the tumor was then resected working posterior to anterior.  Tumor was intermittently irrigated from the bladder.  In areas of the tumor once initial resection was performed there was yellowish, nodular tumor which was carefully resected down to muscle.  Once all specimen was moved via irrigation deeper resection of the bladder base was performed and certain areas where muscle fibers were not yet visible.  The resectoscope was replaced with a laser bridge And a 0.038 Sensor wire was placed into the left UO followed by a 6 [place] open-ended ureteral catheter.  Retrograde pyelogram was then performed with Omnipaque contrast with findings as described above.  The ureteral catheter was removed and attention directed to the right UO.  The guidewire was able to be advanced under fluoroscopic guidance to the region of the UPJ.  The resectoscope was  removed and a 4.5 French semirigid ureteroscope was placed per urethra and  advanced to just below the UPJ with findings as described above.  Pullout retrograde pyelogram was then performed with findings as described above.  A 20 French Foley catheter was placed and the balloon inflated with 10 cc of sterile water.  The catheter was irrigated with return of clear effluent.  After anesthetic reversal she was transported to the PACU in stable condition.  Plan: Indwelling catheter x 1 week Will notify once pathology results are received   [name], M.D.

## 2022-12-21 NOTE — Transfer of Care (Signed)
Immediate Anesthesia Transfer of Care Note  Patient: Regina Perkins  Procedure(s) Performed: TRANSURETHRAL RESECTION OF BLADDER TUMOR (TURBT) CYSTOSCOPY WITH RETROGRADE PYELOGRAM (Bilateral)  Patient Location: PACU  Anesthesia Type:General  Level of Consciousness: drowsy, patient cooperative, and responds to stimulation  Airway & Oxygen Therapy: Patient Spontanous Breathing and Patient connected to face mask oxygen  Post-op Assessment: Report given to RN and Post -op Vital signs reviewed and stable  Post vital signs: Reviewed and stable  Last Vitals:  Vitals Value Taken Time  BP 178/78 12/21/22 1430  Temp    Pulse 72 12/21/22 1433  Resp 14 12/21/22 1433  SpO2 100 % 12/21/22 1433  Vitals shown include unfiled device data.  Last Pain:  Vitals:   12/21/22 1050  TempSrc: Temporal  PainSc: 0-No pain         Complications: No notable events documented.

## 2022-12-21 NOTE — Interval H&P Note (Signed)
History and Physical Interval Note:  12/21/2022 11:32 AM  Regina Perkins  has presented today for surgery, with the diagnosis of Bladder Tumor.  The various methods of treatment have been discussed with the patient and family. After consideration of risks, benefits and other options for treatment, the patient has consented to  Procedure(s): TRANSURETHRAL RESECTION OF BLADDER TUMOR (TURBT) (N/A) CYSTOSCOPY WITH RETROGRADE PYELOGRAM (Bilateral) as a surgical intervention.  The patient's history has been reviewed, patient examined, no change in status, stable for surgery.  I have reviewed the patient's chart and labs.  Questions were answered to the patient's satisfaction.     Zandria Woldt C Sabrina Arriaga

## 2022-12-21 NOTE — Anesthesia Preprocedure Evaluation (Signed)
Anesthesia Evaluation  Patient identified by MRN, date of birth, ID band Patient awake    Reviewed: Allergy & Precautions, NPO status , Patient's Chart, lab work & pertinent test results  History of Anesthesia Complications Negative for: history of anesthetic complications  Airway Mallampati: III  TM Distance: >3 FB Neck ROM: full    Dental  (+) Chipped, Dental Advidsory Given   Pulmonary shortness of breath and with exertion, Current Smoker and Patient abstained from smoking. Hx of lung cancer   Pulmonary exam normal        Cardiovascular Exercise Tolerance: Good hypertension, (-) angina negative cardio ROS Normal cardiovascular exam     Neuro/Psych negative neurological ROS  negative psych ROS   GI/Hepatic negative GI ROS, Neg liver ROS,,,  Endo/Other  negative endocrine ROSdiabetes, Type 2    Renal/GU      Musculoskeletal   Abdominal   Peds  Hematology negative hematology ROS (+)   Anesthesia Other Findings Past Medical History: No date: Atherosclerosis of native arteries of extremity with  intermittent claudication (HCC) No date: Bladder mass No date: Chronic kidney disease, stage 3a (HCC) No date: Diabetes mellitus without complication (HCC) No date: Gastrointestinal hemorrhage No date: Glaucoma No date: HTN (hypertension) No date: Hypertension No date: Hypokalemia No date: Hypomagnesemia No date: Leukocytosis No date: Malignant neoplasm of lung (HCC) No date: Malnutrition (HCC) No date: Normocytic anemia No date: Pericardial effusion No date: Pulmonary embolism (HCC) No date: Sepsis (HCC) No date: Type II diabetes mellitus with renal manifestations (HCC)  Past Surgical History: No date: ABDOMINAL HYSTERECTOMY No date: BACK SURGERY     Comment:  1988 and 1998 07/29/2022: ESOPHAGOGASTRODUODENOSCOPY (EGD) WITH PROPOFOL; N/A     Comment:  Procedure: ESOPHAGOGASTRODUODENOSCOPY (EGD) WITH                PROPOFOL;  Surgeon: Wyline Mood, MD;  Location: [facility]               ENDOSCOPY;  Service: Gastroenterology;  Laterality: N/A;     Reproductive/Obstetrics negative OB ROS                             Anesthesia Physical Anesthesia Plan  ASA: 3  Anesthesia Plan: General ETT and General   Post-op Pain Management:    Induction: Intravenous  PONV Risk Score and Plan: 3 and Ondansetron, Treatment may vary due to age or medical condition and Dexamethasone  Airway Management Planned: Oral ETT  Additional Equipment:   Intra-op Plan:   Post-operative Plan: Extubation in OR  Informed Consent: I have reviewed the patients History and Physical, chart, labs and discussed the procedure including the risks, benefits and alternatives for the proposed anesthesia with the patient or authorized representative who has indicated his/her understanding and acceptance.     Dental Advisory Given  Plan Discussed with: Anesthesiologist, CRNA and Surgeon  Anesthesia Plan Comments: (Patient consented for risks of anesthesia including but not limited to:  - adverse reactions to medications - damage to eyes, teeth, lips or other oral mucosa - nerve damage due to positioning  - sore throat or hoarseness - Damage to heart, brain, nerves, lungs, other parts of body or loss of life  Patient voiced understanding.)       Anesthesia Quick Evaluation

## 2022-12-21 NOTE — Discharge Instructions (Addendum)
AMBULATORY SURGERY  DISCHARGE INSTRUCTIONS   The drugs that you were given will stay in your system until tomorrow so for the next 24 hours you should not:  Drive an automobile Make any legal decisions Drink any alcoholic beverage   You may resume regular meals tomorrow.  Today it is better to start with liquids and gradually work up to solid foods.  You may eat anything you prefer, but it is better to start with liquids, then soup and crackers, and gradually work up to solid foods.   Please notify your doctor immediately if you have any unusual bleeding, trouble breathing, redness and pain at the surgery site, drainage, fever, or pain not relieved by medication.  Your post-operative visit with Dr.                                     is: Date:                        Time:    Please call to schedule your post-operative visit.  Additional Instructions:  Transurethral Resection of Bladder Tumor (TURBT) or Bladder Biopsy   Definition:  Transurethral Resection of the Bladder Tumor is a surgical procedure used to diagnose and remove tumors within the bladder.   General instructions:     Your recent bladder surgery requires very little post hospital care but some definite precautions.  Despite the fact that no skin incisions were used, the area around the bladder incisions are raw and covered with scabs to promote healing and prevent bleeding. Certain precautions are needed to insure that the scabs are not disturbed over the next 2-4 weeks while the healing proceeds.  Because the raw surface inside your bladder and the irritating effects of urine you may expect frequency of urination and/or urgency (a stronger desire to urinate) and perhaps even getting up at night more often. This will usually resolve or improve slowly over the healing period. You may see some blood in your urine over the first 6 weeks. Do not be alarmed, even if the urine was clear for a while. Get off your feet and  drink lots of fluids until clearing occurs. If you start to pass clots or don't improve call us.  Diet:  You may return to your normal diet immediately. Because of the raw surface of your bladder, alcohol, spicy foods, foods high in acid and drinks with caffeine may cause irritation or frequency and should be used in moderation. To keep your urine flowing freely and avoid constipation, drink plenty of fluids during the day (8-10 glasses). Tip: Avoid cranberry juice because it is very acidic.  Activity:  Your physical activity doesn't need to be restricted. However, if you are very active, you may see some blood in the urine. We suggest that you reduce your activity under the circumstances until the bleeding has stopped.  Bowels:  It is important to keep your bowels regular during the postoperative period. Straining with bowel movements can cause bleeding. A bowel movement every other day is reasonable. Use a mild laxative if needed, such as milk of magnesia 2-3 tablespoons, or 2 Dulcolax tablets. Call if you continue to have problems. If you had been taking narcotics for pain, before, during or after your surgery, you may be constipated. Take a laxative if necessary.    Medication:  You should resume your pre-surgery medications unless  told not to. In addition you may be given an antibiotic to prevent or treat infection. Antibiotics are not always necessary. All medication should be taken as prescribed until the bottles are finished unless you are having an unusual reaction to one of the drugs.  Follow-up:  You will be contacted with your pathology results and scheduled for an appointment to have your catheter removed next week  [facility] [address], [address] [place], [place] [address] [phone]

## 2022-12-21 NOTE — ED Triage Notes (Addendum)
Pt states she had a bladder surgery earlier today where they biopsied a tumor in her bladder. Pt had foley placed but is urinating around foley, urine is bloody. Pt c/o pain to lower abd. Pt is not on blood thinners. Pt is currently getting chemo for lung cancer.

## 2022-12-21 NOTE — ED Provider Notes (Signed)
[facility] Provider Note    Event Date/Time   First MD Initiated Contact with Patient 12/21/22 2208     (approximate)   History   Chief Complaint: Post-op Problem   HPI  Regina Perkins is a 82 y.o. female with a history of hypertension CKD diabetes lung cancer who comes ED complaining of lower abdominal pain and blood in urine that started this afternoon.  Patient had a bladder biopsy earlier today due to a tumor, and Foley catheter was placed.  Patient reports that this afternoon, urine is draining around the catheter and not into the collection bag and lower abdomen is becoming increasingly painful.  Denies chest pain shortness of breath or fever.     Physical Exam   Triage Vital Signs: ED Triage Vitals  Encounter Vitals Group     BP 12/21/22 2134 123/88     Systolic BP Percentile --      Diastolic BP Percentile --      Pulse Rate 12/21/22 2135 89     Resp 12/21/22 2134 18     Temp 12/21/22 2134 97.8 F (36.6 C)     Temp Source 12/21/22 2134 Oral     SpO2 12/21/22 2135 100 %     Weight 12/21/22 2132 116 lb (52.6 kg)     Height 12/21/22 2132 5\' 3"  (1.6 m)     Head Circumference --      Peak Flow --      Pain Score 12/21/22 2132 8     Pain Loc --      Pain Education --      Exclude from Growth Chart --     Most recent vital signs: Vitals:   12/21/22 2134 12/21/22 2135  BP: 123/88   Pulse:  89  Resp: 18   Temp: 97.8 F (36.6 C)   SpO2:  100%    General: Awake, no distress.  CV:  Good peripheral perfusion.  Regular rate and rhythm Resp:  Normal effort.  Abd:  No distention.  Soft with markedly enlarged and firm palpable bladder in the lower abdomen Other:  Small amount of gross blood in the Foley collection bag   ED Results / Procedures / Treatments   Labs (all labs ordered are listed, but only abnormal results are displayed) Labs Reviewed  CBC WITH DIFFERENTIAL/PLATELET - Abnormal; Notable for the following components:       Result Value   Hemoglobin 11.1 (*)    HCT 35.1 (*)    RDW 18.0 (*)    All other components within normal limits  COMPREHENSIVE METABOLIC PANEL - Abnormal; Notable for the following components:   Sodium 132 (*)    CO2 19 (*)    Glucose, Bld 297 (*)    BUN 24 (*)    Creatinine, Ser 1.29 (*)    Calcium 8.4 (*)    GFR, Estimated 42 (*)    All other components within normal limits     EKG    RADIOLOGY    PROCEDURES:  Procedures   MEDICATIONS ORDERED IN ED: Medications  sodium chloride irrigation 0.9 % 3,000 mL (has no administration in time range)     IMPRESSION / MDM / ASSESSMENT AND PLAN / ED COURSE  I reviewed the triage vital signs and the nursing notes.  DDx: AKI, electrolyte abnormality, anemia, bladder obstruction from biopsy site bleeding  Patient's presentation is most consistent with acute presentation with potential threat to life or bodily function.  Patient presents with lower abdominal pain, some urine drainage around Foley catheter.  Clinically it seems that her existing Foley is obstructed by blood and she has urinary retention.  Will confirm with bladder scan and then plan for bladder irrigation.       FINAL CLINICAL IMPRESSION(S) / ED DIAGNOSES   Final diagnoses:  Gross hematuria  Urinary catheter dysfunction, initial encounter (HCC)     Rx / DC Orders   ED Discharge Orders     None        Note:  This document was prepared using Dragon voice recognition software and may include unintentional dictation errors.   Sharman Cheek, MD 12/21/22 [phone]

## 2022-12-22 ENCOUNTER — Encounter: Payer: Self-pay | Admitting: Urology

## 2022-12-22 DIAGNOSIS — F17210 Nicotine dependence, cigarettes, uncomplicated: Secondary | ICD-10-CM | POA: Diagnosis present

## 2022-12-22 DIAGNOSIS — K219 Gastro-esophageal reflux disease without esophagitis: Secondary | ICD-10-CM | POA: Diagnosis present

## 2022-12-22 DIAGNOSIS — C674 Malignant neoplasm of posterior wall of bladder: Secondary | ICD-10-CM | POA: Diagnosis present

## 2022-12-22 DIAGNOSIS — R310 Gross hematuria: Principal | ICD-10-CM

## 2022-12-22 DIAGNOSIS — Z9889 Other specified postprocedural states: Secondary | ICD-10-CM | POA: Diagnosis not present

## 2022-12-22 DIAGNOSIS — Z7984 Long term (current) use of oral hypoglycemic drugs: Secondary | ICD-10-CM | POA: Diagnosis not present

## 2022-12-22 DIAGNOSIS — I70219 Atherosclerosis of native arteries of extremities with intermittent claudication, unspecified extremity: Secondary | ICD-10-CM | POA: Diagnosis present

## 2022-12-22 DIAGNOSIS — N179 Acute kidney failure, unspecified: Secondary | ICD-10-CM

## 2022-12-22 DIAGNOSIS — D62 Acute posthemorrhagic anemia: Secondary | ICD-10-CM | POA: Diagnosis not present

## 2022-12-22 DIAGNOSIS — N99820 Postprocedural hemorrhage and hematoma of a genitourinary system organ or structure following a genitourinary system procedure: Secondary | ICD-10-CM | POA: Diagnosis not present

## 2022-12-22 DIAGNOSIS — E860 Dehydration: Secondary | ICD-10-CM | POA: Diagnosis not present

## 2022-12-22 DIAGNOSIS — E440 Moderate protein-calorie malnutrition: Secondary | ICD-10-CM | POA: Diagnosis present

## 2022-12-22 DIAGNOSIS — J449 Chronic obstructive pulmonary disease, unspecified: Secondary | ICD-10-CM

## 2022-12-22 DIAGNOSIS — E876 Hypokalemia: Secondary | ICD-10-CM | POA: Diagnosis present

## 2022-12-22 DIAGNOSIS — T83038A Leakage of other indwelling urethral catheter, initial encounter: Secondary | ICD-10-CM | POA: Diagnosis not present

## 2022-12-22 DIAGNOSIS — E1165 Type 2 diabetes mellitus with hyperglycemia: Secondary | ICD-10-CM | POA: Diagnosis present

## 2022-12-22 DIAGNOSIS — Z86711 Personal history of pulmonary embolism: Secondary | ICD-10-CM | POA: Diagnosis not present

## 2022-12-22 DIAGNOSIS — Z8249 Family history of ischemic heart disease and other diseases of the circulatory system: Secondary | ICD-10-CM | POA: Diagnosis not present

## 2022-12-22 DIAGNOSIS — E1122 Type 2 diabetes mellitus with diabetic chronic kidney disease: Secondary | ICD-10-CM | POA: Diagnosis present

## 2022-12-22 DIAGNOSIS — N1831 Chronic kidney disease, stage 3a: Secondary | ICD-10-CM | POA: Diagnosis present

## 2022-12-22 DIAGNOSIS — R000 Tachycardia, unspecified: Secondary | ICD-10-CM | POA: Diagnosis present

## 2022-12-22 DIAGNOSIS — Y846 Urinary catheterization as the cause of abnormal reaction of the patient, or of later complication, without mention of misadventure at the time of the procedure: Secondary | ICD-10-CM | POA: Diagnosis not present

## 2022-12-22 DIAGNOSIS — I129 Hypertensive chronic kidney disease with stage 1 through stage 4 chronic kidney disease, or unspecified chronic kidney disease: Secondary | ICD-10-CM | POA: Diagnosis present

## 2022-12-22 DIAGNOSIS — E785 Hyperlipidemia, unspecified: Secondary | ICD-10-CM | POA: Diagnosis present

## 2022-12-22 DIAGNOSIS — I160 Hypertensive urgency: Secondary | ICD-10-CM

## 2022-12-22 DIAGNOSIS — Z6820 Body mass index (BMI) 20.0-20.9, adult: Secondary | ICD-10-CM | POA: Diagnosis not present

## 2022-12-22 LAB — CBC
HCT: 29.3 % — ABNORMAL LOW (ref 36.0–46.0)
Hemoglobin: 9.3 g/dL — ABNORMAL LOW (ref 12.0–15.0)
MCH: 28.4 pg (ref 26.0–34.0)
MCHC: 31.7 g/dL (ref 30.0–36.0)
MCV: 89.6 fL (ref 80.0–100.0)
Platelets: 240 10*3/uL (ref 150–400)
RBC: 3.27 MIL/uL — ABNORMAL LOW (ref 3.87–5.11)
RDW: 17.9 % — ABNORMAL HIGH (ref 11.5–15.5)
WBC: 15.7 10*3/uL — ABNORMAL HIGH (ref 4.0–10.5)
nRBC: 0 % (ref 0.0–0.2)

## 2022-12-22 LAB — BASIC METABOLIC PANEL
Anion gap: 11 (ref 5–15)
BUN: 26 mg/dL — ABNORMAL HIGH (ref 8–23)
CO2: 20 mmol/L — ABNORMAL LOW (ref 22–32)
Calcium: 8.3 mg/dL — ABNORMAL LOW (ref 8.9–10.3)
Chloride: 103 mmol/L (ref 98–111)
Creatinine, Ser: 1.32 mg/dL — ABNORMAL HIGH (ref 0.44–1.00)
GFR, Estimated: 41 mL/min — ABNORMAL LOW (ref >60)
Glucose, Bld: 283 mg/dL — ABNORMAL HIGH (ref 70–99)
Potassium: 3.8 mmol/L (ref 3.5–5.1)
Sodium: 134 mmol/L — ABNORMAL LOW (ref 135–145)

## 2022-12-22 LAB — HEMOGLOBIN AND HEMATOCRIT, BLOOD
HCT: 27.2 % — ABNORMAL LOW (ref 36.0–46.0)
HCT: 22 % — ABNORMAL LOW (ref 36.0–46.0)
HCT: 20.2 % — ABNORMAL LOW (ref 36.0–46.0)
Hemoglobin: 8.6 g/dL — ABNORMAL LOW (ref 12.0–15.0)
Hemoglobin: 7.3 g/dL — ABNORMAL LOW (ref 12.0–15.0)
Hemoglobin: 6.6 g/dL — ABNORMAL LOW (ref 12.0–15.0)

## 2022-12-22 LAB — GLUCOSE, CAPILLARY
Glucose-Capillary: 189 mg/dL — ABNORMAL HIGH (ref 70–99)
Glucose-Capillary: 155 mg/dL — ABNORMAL HIGH (ref 70–99)
Glucose-Capillary: 59 mg/dL — ABNORMAL LOW (ref 70–99)
Glucose-Capillary: 60 mg/dL — ABNORMAL LOW (ref 70–99)
Glucose-Capillary: 128 mg/dL — ABNORMAL HIGH (ref 70–99)
Glucose-Capillary: 148 mg/dL — ABNORMAL HIGH (ref 70–99)

## 2022-12-22 LAB — PREPARE RBC (CROSSMATCH)

## 2022-12-22 LAB — HEMOGLOBIN A1C
Hgb A1c MFr Bld: 6.7 % — ABNORMAL HIGH (ref 4.8–5.6)
Mean Plasma Glucose: 145.59 mg/dL

## 2022-12-22 MED ORDER — SODIUM CHLORIDE 0.9 % IV SOLN: INTRAVENOUS | Status: DC

## 2022-12-22 MED ORDER — MORPHINE SULFATE (PF) 2 MG/ML IV SOLN: 2.0000 mg | INTRAVENOUS | Status: DC | PRN

## 2022-12-22 MED ORDER — FESOTERODINE FUMARATE ER 4 MG PO TB24
4.0000 mg | ORAL_TABLET | Freq: Every day | ORAL | Status: DC
  Administered 2022-12-22 – 2022-12-25 (×4): 4 mg via ORAL
  Filled 2022-12-22 (×4): qty 1

## 2022-12-22 MED ORDER — MORPHINE SULFATE (PF) 2 MG/ML IV SOLN
2.0000 mg | Freq: Once | INTRAVENOUS | Status: AC
  Administered 2022-12-22: 2 mg via INTRAVENOUS
  Filled 2022-12-22: qty 1

## 2022-12-22 MED ORDER — ACETAMINOPHEN 325 MG PO TABS
650.0000 mg | ORAL_TABLET | Freq: Four times a day (QID) | ORAL | Status: DC | PRN
  Administered 2022-12-23 – 2022-12-24 (×2): 650 mg via ORAL
  Filled 2022-12-22 (×2): qty 2

## 2022-12-22 MED ORDER — LACTATED RINGERS IV BOLUS
1000.0000 mL | Freq: Once | INTRAVENOUS | Status: AC
  Administered 2022-12-22: 1000 mL via INTRAVENOUS

## 2022-12-22 MED ORDER — PANTOPRAZOLE SODIUM 40 MG PO TBEC
40.0000 mg | DELAYED_RELEASE_TABLET | Freq: Every day | ORAL | Status: DC
  Administered 2022-12-22 – 2022-12-25 (×4): 40 mg via ORAL
  Filled 2022-12-22 (×4): qty 1

## 2022-12-22 MED ORDER — ONDANSETRON HCL 4 MG PO TABS: 4.0000 mg | ORAL_TABLET | Freq: Four times a day (QID) | ORAL | Status: DC | PRN

## 2022-12-22 MED ORDER — INSULIN ASPART 100 UNIT/ML IJ SOLN: 0.0000 [IU] | Freq: Every day | INTRAMUSCULAR | Status: DC

## 2022-12-22 MED ORDER — BUDESON-GLYCOPYRROL-FORMOTEROL 160-9-4.8 MCG/ACT IN AERO: 1.0000 | INHALATION_SPRAY | Freq: Every day | RESPIRATORY_TRACT | Status: DC

## 2022-12-22 MED ORDER — ADULT MULTIVITAMIN W/MINERALS CH
1.0000 | ORAL_TABLET | Freq: Every day | ORAL | Status: DC
  Administered 2022-12-22 – 2022-12-25 (×4): 1 via ORAL
  Filled 2022-12-22 (×4): qty 1

## 2022-12-22 MED ORDER — SODIUM CHLORIDE 0.9% IV SOLUTION: Freq: Once | INTRAVENOUS | Status: AC

## 2022-12-22 MED ORDER — LISINOPRIL 20 MG PO TABS
40.0000 mg | ORAL_TABLET | Freq: Every day | ORAL | Status: DC
  Administered 2022-12-22 – 2022-12-25 (×4): 40 mg via ORAL
  Filled 2022-12-22 (×4): qty 2

## 2022-12-22 MED ORDER — MAGNESIUM HYDROXIDE 400 MG/5ML PO SUSP: 30.0000 mL | Freq: Every day | ORAL | Status: DC | PRN

## 2022-12-22 MED ORDER — ONDANSETRON HCL 4 MG/2ML IJ SOLN: 4.0000 mg | Freq: Four times a day (QID) | INTRAMUSCULAR | Status: DC | PRN

## 2022-12-22 MED ORDER — MONTELUKAST SODIUM 10 MG PO TABS
10.0000 mg | ORAL_TABLET | Freq: Every day | ORAL | Status: DC
  Administered 2022-12-22 – 2022-12-24 (×3): 10 mg via ORAL
  Filled 2022-12-22 (×3): qty 1

## 2022-12-22 MED ORDER — CHLORHEXIDINE GLUCONATE CLOTH 2 % EX PADS
6.0000 | MEDICATED_PAD | Freq: Every day | CUTANEOUS | Status: DC
  Administered 2022-12-22 – 2022-12-25 (×4): 6 via TOPICAL

## 2022-12-22 MED ORDER — HYDRALAZINE HCL 20 MG/ML IJ SOLN
10.0000 mg | Freq: Four times a day (QID) | INTRAMUSCULAR | Status: DC | PRN
  Administered 2022-12-22 – 2022-12-25 (×2): 10 mg via INTRAVENOUS
  Filled 2022-12-22 (×2): qty 1

## 2022-12-22 MED ORDER — ENSURE ENLIVE PO LIQD
237.0000 mL | Freq: Two times a day (BID) | ORAL | Status: DC
  Administered 2022-12-23 – 2022-12-24 (×2): 237 mL via ORAL

## 2022-12-22 MED ORDER — FLUTICASONE FUROATE-VILANTEROL 100-25 MCG/ACT IN AEPB: 1.0000 | INHALATION_SPRAY | Freq: Every day | RESPIRATORY_TRACT | Status: DC

## 2022-12-22 MED ORDER — INSULIN ASPART 100 UNIT/ML IJ SOLN
0.0000 [IU] | Freq: Three times a day (TID) | INTRAMUSCULAR | Status: DC
  Administered 2022-12-22 – 2022-12-25 (×3): 2 [IU] via SUBCUTANEOUS
  Filled 2022-12-22 (×3): qty 1

## 2022-12-22 MED ORDER — FLUTICASONE FUROATE-VILANTEROL 100-25 MCG/ACT IN AEPB
1.0000 | INHALATION_SPRAY | Freq: Every day | RESPIRATORY_TRACT | Status: DC
  Administered 2022-12-22 – 2022-12-25 (×4): 1 via RESPIRATORY_TRACT
  Filled 2022-12-22: qty 28

## 2022-12-22 MED ORDER — LABETALOL HCL 5 MG/ML IV SOLN
20.0000 mg | INTRAVENOUS | Status: DC | PRN
  Administered 2022-12-22: 20 mg via INTRAVENOUS
  Filled 2022-12-22: qty 4

## 2022-12-22 MED ORDER — ACETAMINOPHEN 650 MG RE SUPP: 650.0000 mg | Freq: Four times a day (QID) | RECTAL | Status: DC | PRN

## 2022-12-22 MED ORDER — UMECLIDINIUM BROMIDE 62.5 MCG/ACT IN AEPB
1.0000 | INHALATION_SPRAY | Freq: Every day | RESPIRATORY_TRACT | Status: DC
  Administered 2022-12-22 – 2022-12-25 (×4): 1 via RESPIRATORY_TRACT
  Filled 2022-12-22: qty 7

## 2022-12-22 MED ORDER — AMLODIPINE BESYLATE 10 MG PO TABS
10.0000 mg | ORAL_TABLET | Freq: Every day | ORAL | Status: DC
  Administered 2022-12-22 – 2022-12-25 (×4): 10 mg via ORAL
  Filled 2022-12-22 (×4): qty 1

## 2022-12-22 MED ORDER — VITAMIN B-12 1000 MCG PO TABS
1000.0000 ug | ORAL_TABLET | Freq: Every day | ORAL | Status: DC
  Administered 2022-12-22 – 2022-12-25 (×4): 1000 ug via ORAL
  Filled 2022-12-22 (×4): qty 1

## 2022-12-22 MED ORDER — FE FUM-VIT C-VIT B12-FA 460-60-0.01-1 MG PO CAPS
1.0000 | ORAL_CAPSULE | Freq: Every day | ORAL | Status: DC
  Administered 2022-12-22 – 2022-12-25 (×4): 1 via ORAL
  Filled 2022-12-22 (×4): qty 1

## 2022-12-22 MED ORDER — ONDANSETRON HCL 4 MG/2ML IJ SOLN
4.0000 mg | INTRAMUSCULAR | Status: AC
  Administered 2022-12-22: 4 mg via INTRAVENOUS
  Filled 2022-12-22: qty 2

## 2022-12-22 MED ORDER — TRAZODONE HCL 50 MG PO TABS: 25.0000 mg | ORAL_TABLET | Freq: Every evening | ORAL | Status: DC | PRN

## 2022-12-22 NOTE — Consult Note (Signed)
Urology Consult  Requesting physician: Valente David, MD  Reason for consultation: Gross hematuria   History of Present Illness: Regina Perkins is a 82 y.o. female with a >5 cm bladder tumor status post TURBT 12/21/2022.  Urine was clear post procedure and she was discharged home with an indwelling Foley.  She presented to the ED last night with urine draining around the catheter and lower abdominal discomfort.  Her Foley catheter was exchanged for a three-way Foley with parent hematuria without clots.  She was started on CBI with clearing.  She was tachycardic at 120 with stable H/H and was admitted to the hospitalist service.  This morning she complains of mild lower abdominal discomfort.  Past Medical History:  Diagnosis Date   Atherosclerosis of native arteries of extremity with intermittent claudication (HCC)    Bladder mass    Chronic kidney disease, stage 3a (HCC)    Diabetes mellitus without complication (HCC)    Gastrointestinal hemorrhage    Glaucoma    HTN (hypertension)    Hypertension    Hypokalemia    Hypomagnesemia    Leukocytosis    Malignant neoplasm of lung (HCC)    Malnutrition (HCC)    Normocytic anemia    Pericardial effusion    Pulmonary embolism (HCC)    Sepsis (HCC)    Type II diabetes mellitus with renal manifestations (HCC)     Past Surgical History:  Procedure Laterality Date   ABDOMINAL HYSTERECTOMY     BACK SURGERY     1988 and 1998   CYSTOSCOPY W/ RETROGRADES Bilateral 12/21/2022   Procedure: CYSTOSCOPY WITH RETROGRADE PYELOGRAM;  Surgeon: [name], MD;  Location: [facility] ORS;  Service: Urology;  Laterality: Bilateral;   ESOPHAGOGASTRODUODENOSCOPY (EGD) WITH PROPOFOL N/A 07/29/2022   Procedure: ESOPHAGOGASTRODUODENOSCOPY (EGD) WITH PROPOFOL;  Surgeon: Wyline Mood, MD;  Location: [facility] ENDOSCOPY;  Service: Gastroenterology;  Laterality: N/A;   TRANSURETHRAL RESECTION OF BLADDER TUMOR N/A 12/21/2022   Procedure: TRANSURETHRAL RESECTION OF  BLADDER TUMOR (TURBT);  Surgeon: [name], MD;  Location: [facility] ORS;  Service: Urology;  Laterality: N/A;    Home Medications:  No outpatient medications have been marked as taking for the 12/21/22 encounter [facility] Encounter).    Allergies:  Allergies  Allergen Reactions   Ivp Dye [Iodinated Contrast Media]    Shellfish Allergy Other (See Comments)    Reaction: unknown     Family History  Problem Relation Age of Onset   Asthma Mother    Heart disease Mother    Hypertension Mother    Diabetes Mother    Stroke Mother    Diabetes Sister    Diabetes Brother     Social History:  reports that she has been smoking cigarettes. She has never used smokeless tobacco. She reports that she does not drink alcohol and does not use drugs.  ROS: As per the HPI  Physical Exam:  Vital signs in last 24 hours: Temp:  [97.4 F (36.3 C)-98.8 F (37.1 C)] 97.8 F (36.6 C) (08/28 0539) Pulse Rate:  [73-126] 82 (08/28 0539) Resp:  [16-23] 17 (08/28 0539) BP: (115-203)/(68-103) 181/82 (08/28 0539) SpO2:  [93 %-100 %] 100 % (08/28 0539) Weight:  [52.6 kg] 52.6 kg (08/27 2132) Constitutional:  Alert, No acute distress HEENT: [place] AT GI: Abdomen is soft, nontender, nondistended, no abdominal masses GU: The CBI bag was empty and not running.  The catheter was irrigated with 180 mL of sterile water with rose effluent and no clots. 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  Discontinue CBI and gentle manual irrigation as  needed   12/22/2022, 7:36 AM  Irineo Axon,  MD

## 2022-12-22 NOTE — Assessment & Plan Note (Signed)
-   The patient will be continued on his inhalers.

## 2022-12-22 NOTE — Assessment & Plan Note (Addendum)
Blood pressure elevated this morning. -Continue home amlodipine and Zestril for now, we will hold ACE inhibitor if renal function continued to get worse, currently stable. -As needed labetalol

## 2022-12-22 NOTE — Hospital Course (Signed)
Taken from prior notes.  Regina Perkins is a 82 y.o. female with medical history significant for hypertension, dyslipidemia, lung cancer, PE, sepsis, and type II diabetes mellitus, who presented to the emergency room with acute onset of persistent hematuria.  Patient with recent concern of bladder tumor s/p TURP with urology earlier in the day, she was discharged home after the procedure with Foley catheter in place.  Due to worsening hematuria and leakage around the catheter she came back to ED.  On presentation vitals were stable, hemoglobin 11.1, pseudohyponatremia secondary to hyperglycemia, mild AKI with creatinine at 1.29 with baseline around 1.  Patient received a bolus of LR and urology was consulted.  Patient was started on CBI.  8/28: Blood pressure elevated at 181/82, leukocytosis to 15.7 and hemoglobin decreased to 9.3.  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  Urology stopped CBI and advising gentle manual irrigation as needed.  8/29: Patient continued to have significant hematuria, hemoglobin dropped to 6.6 yesterday evening s/p 1 unit of PRBC, hemoglobin at 7.5 this morning. Urology is recommending continue manual irrigation, no OR today. Will be n.p.o. after midnight and they will reevaluate tomorrow morning if she need to go back to the OR or not.

## 2022-12-22 NOTE — H&P (Addendum)
[place]   PATIENT NAME: Regina Perkins    MR#:  [id number]  DATE OF BIRTH:  1940-10-08  DATE OF ADMISSION:  12/21/2022  PRIMARY CARE PHYSICIAN: [facility]   Patient is coming from: Home  REQUESTING/REFERRING PHYSICIAN: Loleta Rose, MD  CHIEF COMPLAINT:   Chief Complaint  Patient presents with   Post-op Problem    HISTORY OF PRESENT ILLNESS:  Regina Perkins is a 82 y.o. female with medical history significant for hypertension, dyslipidemia, lung cancer, PE, sepsis, and type II diabetes mellitus, who presented to the emergency room with acute onset of persistent hematuria.  The patient underwent cystoscopy for bladder tumor resection earlier during the day and after going home hematuria has not stopped.  She denied having any blood clots.  She admits to lower abdominal tenderness and pain.  No fever or chills.  No other hematuria has been with bright red blood.  No nausea or vomiting.  No dysuria, oliguria or hematuria or flank pain.  ED Course: When she came to the ER, BP was 146/81 with otherwise normal vital signs.  Labs revealed mild hyponatremia and CO2 of 19 with a BUN of 24 and creatinine 1.29 above previous normal levels and calcium 8.4 with otherwise unremarkable CMP.  CBC showed mild anemia close to previous levels.  Blood group was O+ with antibody screen. EKG as reviewed by me : None Imaging: None.  The patient was given 4 mg of IV Zofran, 2 mg of IV morphine sulfate, 1 p.o. Percocet, 1 L bolus of IV lactated Ringer.  Dr. Lonna Cobb was notified and is aware about the patient.  He recommended CBI.  She will be admitted to a progressive unit observation bed for further evaluation and management. PAST MEDICAL HISTORY:   Past Medical History:  Diagnosis Date   Atherosclerosis of native arteries of extremity with intermittent claudication (HCC)    Bladder mass    Chronic kidney disease, stage 3a (HCC)    Diabetes mellitus without  complication (HCC)    Gastrointestinal hemorrhage    Glaucoma    HTN (hypertension)    Hypertension    Hypokalemia    Hypomagnesemia    Leukocytosis    Malignant neoplasm of lung (HCC)    Malnutrition (HCC)    Normocytic anemia    Pericardial effusion    Pulmonary embolism (HCC)    Sepsis (HCC)    Type II diabetes mellitus with renal manifestations (HCC)     PAST SURGICAL HISTORY:   Past Surgical History:  Procedure Laterality Date   ABDOMINAL HYSTERECTOMY     BACK SURGERY     1988 and 1998   ESOPHAGOGASTRODUODENOSCOPY (EGD) WITH PROPOFOL N/A 07/29/2022   Procedure: ESOPHAGOGASTRODUODENOSCOPY (EGD) WITH PROPOFOL;  Surgeon: Wyline Mood, MD;  Location: [facility] ENDOSCOPY;  Service: Gastroenterology;  Laterality: N/A;    SOCIAL HISTORY:   Social History   Tobacco Use   Smoking status: Every Day    Current packs/day: 0.25    Types: Cigarettes   Smokeless tobacco: Never  Substance Use Topics   Alcohol use: No    FAMILY HISTORY:   Family History  Problem Relation Age of Onset   Asthma Mother    Heart disease Mother    Hypertension Mother    Diabetes Mother    Stroke Mother    Diabetes Sister    Diabetes Brother     DRUG ALLERGIES:   Allergies  Allergen Reactions   Ivp Dye [Iodinated  Contrast Media]    Shellfish Allergy Other (See Comments)    Reaction: unknown     REVIEW OF SYSTEMS:   ROS As per history of present illness. All pertinent systems were reviewed above. Constitutional, HEENT, cardiovascular, respiratory, GI, GU, musculoskeletal, neuro, psychiatric, endocrine, integumentary and hematologic systems were reviewed and are otherwise negative/unremarkable except for positive findings mentioned above in the HPI.   MEDICATIONS AT HOME:   Prior to Admission medications   Medication Sig Start Date End Date Taking? Authorizing Provider  amLODipine (NORVASC) 10 MG tablet Take 10 mg by mouth daily.    [provider]  BREZTRI AEROSPHERE 160-9-4.8  MCG/ACT AERO Inhale 1 puff into the lungs daily. 06/03/22 06/03/23  [provider]  cyanocobalamin (VITAMIN B12) 1000 MCG tablet Take 1 tablet (1,000 mcg total) by mouth daily. 08/25/22   Rickard Patience, MD  fluticasone (FLONASE) 50 MCG/ACT nasal spray Place 1 spray into both nostrils daily. Patient not taking: Reported on 12/13/2022    [provider]  fluticasone furoate-vilanterol (BREO ELLIPTA) 100-25 MCG/ACT AEPB Inhale 1 puff into the lungs daily.    [provider]  Fluticasone-Umeclidin-Vilant (TRELEGY ELLIPTA) 100-62.5-25 MCG/ACT AEPB Inhale into the lungs. 06/03/22   [provider]  furosemide (LASIX) 40 MG tablet Take 1 tablet (40 mg total) by mouth daily. Patient not taking: Reported on 12/21/2022 05/27/22 12/13/22  Leeroy Bock, MD  lisinopril (ZESTRIL) 40 MG tablet Take 40 mg by mouth daily.    [provider]  metFORMIN (GLUCOPHAGE) 1000 MG tablet Take 1,000 mg by mouth 2 (two) times daily with a meal.    [provider]  montelukast (SINGULAIR) 10 MG tablet Take 10 mg by mouth at bedtime.    [provider]  omeprazole (PRILOSEC) 40 MG capsule Take 1 capsule (40 mg total) by mouth in the morning and at bedtime. 07/29/22   Arnetha Courser, MD  trospium (SANCTURA) 20 MG tablet Take 1 tablet (20 mg total) by mouth 2 (two) times daily as needed (frequency,urgency,bladder spasm). 12/21/22   Stoioff, Verna Czech, MD      VITAL SIGNS:  Blood pressure (!) 167/91, pulse (!) 126, temperature 97.9 F (36.6 C), temperature source Oral, resp. rate (!) 22, height 5\' 3"  (1.6 m), weight 52.6 kg, SpO2 99%.  PHYSICAL EXAMINATION:  Physical Exam  GENERAL:  82 y.o.-year-old female patient lying in the bed with no acute distress.  EYES: Pupils equal, round, reactive to light and accommodation. No scleral icterus. Extraocular muscles intact.  HEENT: Head atraumatic, normocephalic. Oropharynx and nasopharynx clear.  NECK:  Supple, no jugular venous  distention. No thyroid enlargement, no tenderness.  LUNGS: Normal breath sounds bilaterally, no wheezing, rales,rhonchi or crepitation. No use of accessory muscles of respiration.  CARDIOVASCULAR: Regular rate and rhythm, S1, S2 normal. No murmurs, rubs, or gallops.  ABDOMEN: Soft, nondistended, nontender. Bowel sounds present. No organomegaly or mass. Genitourinary: CBI catheter in place with pink urine. EXTREMITIES: No pedal edema, cyanosis, or clubbing.  NEUROLOGIC: Cranial nerves II through XII are intact. Muscle strength 5/5 in all extremities. Sensation intact. Gait not checked.  PSYCHIATRIC: The patient is alert and oriented x 3.  Normal affect and good eye contact. SKIN: No obvious rash, lesion, or ulcer.   LABORATORY PANEL:   CBC Recent Labs  Lab 12/22/22 0340  WBC 15.7*  HGB 9.3*  HCT 29.3*  PLT 240   ------------------------------------------------------------------------------------------------------------------  Chemistries  Recent Labs  Lab 12/21/22 2136 12/22/22 0340  NA 132* 134*  K 3.6 3.8  CL 100 103  CO2 19* 20*  GLUCOSE 297* 283*  BUN 24* 26*  CREATININE 1.29* 1.32*  CALCIUM 8.4* 8.3*  AST 31  --   ALT 22  --   ALKPHOS 57  --   BILITOT 0.4  --    ------------------------------------------------------------------------------------------------------------------  Cardiac Enzymes No results for input(s): "TROPONINI" in the last 168 hours. ------------------------------------------------------------------------------------------------------------------  RADIOLOGY:  DG OR UROLOGY CYSTO IMAGE ([facility] ONLY)  Result Date: 12/21/2022 There is no interpretation for this exam.  This order is for images obtained during a surgical procedure.  Please See "Surgeries" Tab for more information regarding the procedure.      IMPRESSION AND PLAN:  Assessment and Plan: * Gross hematuria - The patient will be admitted to an observation progressive unit bed. - Will  follow serial hemoglobins and hematocrits. - She will be hydrated with IV normal saline. - Follow-up urology consult to be obtained. - Dr. Lonna Cobb was notified about the patient. - Will keep the patient n.p.o.  AKI (acute kidney injury) (HCC) - This is likely prerenal secondary to volume depletion and mild dehydration. - The patient better with IV normal saline. - We will hold off nephrotoxins. - Will follow BMPs.  Hypertensive urgency - The patient will be placed on as needed IV labetalol. - We will continue his amlodipine and hold on Zestril given mild AKI.  GERD without esophagitis - We will continue PPI therapy.  Chronic obstructive pulmonary disease (COPD) (HCC) - The patient will be continued on his inhalers.    DVT prophylaxis: SCD's. Advanced Care Planning:  Code Status: full code. Family Communication:  The plan of care was discussed in details with the patient (and family). I answered all questions. The patient agreed to proceed with the above mentioned plan. Further management will depend upon hospital course. Disposition Plan: Back to previous home environment Consults called: Urology All the records are reviewed and case discussed with ED provider.  Status is: Observation  I certify that at the time of admission, it is my clinical judgment that the patient will require  hospital care extending less than 2 midnights.                            Dispo: The patient is from: Home              Anticipated d/c is to: Home              Patient currently is not medically stable to d/c.              Difficult to place patient: No  Hannah Beat M.D on 12/22/2022 at 5:04 AM  [facility]   From 7 PM-7 AM, contact night-coverage [email or web address]  CC: Primary care physician; [facility]

## 2022-12-22 NOTE — Progress Notes (Signed)
  Progress Note   Patient: Regina Perkins ZOX:[id number] DOB: 07-12-1940 DOA: 12/21/2022     0 DOS: the patient was seen and examined on 12/22/2022   Brief hospital course: Taken from prior notes.  Dellanira L Hessling is a 82 y.o. female with medical history significant for hypertension, dyslipidemia, lung cancer, PE, sepsis, and type II diabetes mellitus, who presented to the emergency room with acute onset of persistent hematuria.  Patient with recent concern of bladder tumor s/p TURP with urology earlier in the day, she was discharged home after the procedure with Foley catheter in place.  Due to worsening hematuria and leakage around the catheter she came back to ED.  On presentation vitals were stable, hemoglobin 11.1, pseudohyponatremia secondary to hyperglycemia, mild AKI with creatinine at 1.29 with baseline around 1.  Patient received a bolus of LR and urology was consulted.  Patient was started on CBI.  8/28: Blood pressure elevated at 181/82, leukocytosis to 15.7 and hemoglobin decreased to 9.3.  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  Urology stopped CBI and advising gentle manual irrigation as needed.    Assessment and Plan: * Gross hematuria Continue to have gross hematuria with hemoglobin decreased to 9.3 from 11. Urology stopped CBI and is advising gentle manual irrigation. 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 -Continue with gentle manual irrigation as advised by urology -Maintain Foley catheter -Monitor hemoglobin -Transfuse if below 7  AKI (acute kidney injury) (HCC) - This is likely prerenal secondary to volume depletion and mild dehydration. -Continue with IV fluid -Monitor renal function -Avoid nephrotoxin  Hypertensive urgency Blood pressure elevated this morning. -Continue home amlodipine and Zestril for now, we will hold ACE inhibitor if renal function continued to get worse, currently stable. -As  needed labetalol  GERD without esophagitis - We will continue PPI therapy.  Chronic obstructive pulmonary disease (COPD) (HCC) - The patient will be continued on his inhalers.    Subjective: Patient was having mild lower abdominal discomfort when seen today.  Continue to have gross hematuria.  Physical Exam: Vitals:   12/22/22 0321 12/22/22 0428 12/22/22 0539 12/22/22 0821  BP: (!) 180/96 (!) 167/91 (!) 181/82 133/66  Pulse: (!) 117 (!) 126 82 (!) 106  Resp: 17 (!) 22 17 16   Temp: 97.9 F (36.6 C)  97.8 F (36.6 C) 97.7 F (36.5 C)  TempSrc: Oral  Oral Oral  SpO2: 99%  100% 100%  Weight:      Height:       General.  Frail and malnourished elderly lady, in no acute distress. Pulmonary.  Lungs clear bilaterally, normal respiratory effort. CV.  Regular rate and rhythm, no JVD, rub or murmur. Abdomen.  Soft, nontender, nondistended, BS positive. CNS.  Alert and oriented .  No focal neurologic deficit. Extremities.  No edema, no cyanosis, pulses intact and symmetrical. Psychiatry.  Judgment and insight appears normal.   Data Reviewed: Prior data reviewed  Family Communication: Grandson at bedside  Disposition: Status is: Observation The patient will require care spanning > 2 midnights and should be moved to inpatient because: Severity of illness  Planned Discharge Destination: Home  DVT prophylaxis.  SCDs Time spent:  minutes  This record has been created using Conservation officer, historic buildings. Errors have been sought and corrected,but may not always be located. Such creation errors do not reflect on the standard of care.   Author: Arnetha Courser, MD 12/22/2022 1:01 PM  For on call review [email or web address].

## 2022-12-22 NOTE — Progress Notes (Signed)
Was asked by Dr. Allena Katz to evaluate the patient for gross hematuria.  Patient's urine was clear dark red in the tubing,  Regina Perkins was resting comfortably and denied any bladder pain.  I gently irrigated with 500 mL of sterile water and retrieved 100 cc of old clot.  Urine was clear dark pink when comppleted and catheter was draining freely.  She tolerated the procedure well.    Because such a large tumor was removed from her bladder, there is concern of possible bladder perforation with aggressive manual irrigation or CBI.  Ideally, we would like to keep her catheter flowing with gentle manual irrigation just to remove clots.  I will make her n.p.o. after midnight, so that we can reassess her and take her to the OR to fulgurate the bladder in the a.m.

## 2022-12-22 NOTE — ED Notes (Signed)
Pt noted to have increased amount of blood post 3L irrigation. Dr. York Cerise updated at this time

## 2022-12-22 NOTE — Progress Notes (Signed)
Initial Nutrition Assessment  DOCUMENTATION CODES:   Non-severe (moderate) malnutrition in context of chronic illness  INTERVENTION:   -MVI with minerals daily -Ensure Enlive po BID, each supplement provides 350 kcal and 20 grams of protein.  -Liberalize diet to 2 gram sodium for wider variety of meal selections  NUTRITION DIAGNOSIS:   Moderate Malnutrition related to chronic illness (lung cancer) as evidenced by mild fat depletion, moderate fat depletion, mild muscle depletion, moderate muscle depletion.  GOAL:   Patient will meet greater than or equal to 90% of their needs  MONITOR:   PO intake, Supplement acceptance  REASON FOR ASSESSMENT:   Malnutrition Screening Tool    ASSESSMENT:   Pt with medical history significant for hypertension, dyslipidemia, lung cancer, PE, sepsis, and type II diabetes mellitus, who presented with acute onset of persistent hematuria.  Pt admitted with gross hematuria and AKI.   8/27- s/p Procedure(s): TRANSURETHRAL RESECTION OF BLADDER TUMOR (TURBT) (N/A) CYSTOSCOPY WITH RETROGRADE PYELOGRAM   Reviewed I/O's: +1.3 L x 24 hours  UOP: 2.9 L x 24 hours  Per urology notes, plan to fulgurate bladder tomorrow.   Spoke with pt at bedside, who was pleasant and in good spirits. Pt reports decreased oral intake over the past 1-2 days PTA secondary to pain. At baseline, pt has a good appetite and "eats like pregnant person". She shares that she grazes all day on whatever is provided to her.   Pt currently on a renal diet. Noted meal completions 40%. Pt shares that she ate "most" of her breakfast and lunch. Pt has no teeth, but denies difficulty chewing or swallowing food. Pt has access to dentures, but rarely uses them.   Reviewed wt hx; wt has been stable over the past month. Pt reports her UBW is around 112#. Pt denies any weight changes.   Discussed importance of good meal and supplement intake to promote healing. Pt amenable to supplements.     Medications reviewed and include vitamin B-12 and 0.9% sodium chloride infusion @ 100 ml/hr.   Lab Results  Component Value Date   HGBA1C 6.7 (H) 12/21/2022   PTA DM medications are 1000 mg metformin BID.   Labs reviewed: CBGS: 121-189 (inpatient orders for glycemic control are 0-5 units insulin aspart daily at bedtime and 0-9 units insulin aspart TID with meals).    NUTRITION - FOCUSED PHYSICAL EXAM:  Flowsheet Row Most Recent Value  Orbital Region No depletion  Upper Arm Region Mild depletion  Thoracic and Lumbar Region Mild depletion  Buccal Region No depletion  Temple Region No depletion  Clavicle Bone Region Moderate depletion  Clavicle and Acromion Bone Region Moderate depletion  Scapular Bone Region Moderate depletion  Dorsal Hand No depletion  Patellar Region Mild depletion  Anterior Thigh Region Mild depletion  Posterior Calf Region Mild depletion  Edema (RD Assessment) None  Hair Reviewed  Eyes Reviewed  Mouth Reviewed  Skin Reviewed  Nails Reviewed       Diet Order:   Diet Order             Diet NPO time specified  Diet effective midnight           Diet renal with fluid restriction Fluid restriction: 1200 mL Fluid; Room service appropriate? Yes; Fluid consistency: Thin  Diet effective now                   EDUCATION NEEDS:   Education needs have been addressed  Skin:  Skin Assessment: Reviewed RN  Assessment  Last BM:  Unknown  Height:   Ht Readings from Last 1 Encounters:  12/21/22 5\' 3"  (1.6 m)    Weight:   Wt Readings from Last 1 Encounters:  12/21/22 52.6 kg    Ideal Body Weight:  52.3 kg  BMI:  Body mass index is 20.55 kg/m.  Estimated Nutritional Needs:   Kcal:  1550-1750  Protein:  80-95 grams  Fluid:  > 1.5 L    Levada Schilling, RD, LDN, CDCES Registered Dietitian II Certified Diabetes Care and Education Specialist Please refer to [facility] for RD and/or RD on-call/weekend/after hours pager

## 2022-12-22 NOTE — Assessment & Plan Note (Addendum)
Continue to have gross hematuria with hemoglobin decreased to 9.3 from 11. Urology stopped CBI and is advising gentle manual irrigation. 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 -Continue with gentle manual irrigation as advised by urology -Maintain Foley catheter -Monitor hemoglobin -Transfuse if below 7

## 2022-12-22 NOTE — Assessment & Plan Note (Addendum)
-   This is likely prerenal secondary to volume depletion and mild dehydration. -Continue with IV fluid -Monitor renal function -Avoid nephrotoxin

## 2022-12-22 NOTE — Assessment & Plan Note (Signed)
-   We will continue PPI therapy 

## 2022-12-23 ENCOUNTER — Inpatient Hospital Stay: Payer: Medicare Other | Admitting: Hospice and Palliative Medicine

## 2022-12-23 ENCOUNTER — Encounter: Payer: Medicare Other | Admitting: Physician Assistant

## 2022-12-23 DIAGNOSIS — R310 Gross hematuria: Secondary | ICD-10-CM | POA: Diagnosis not present

## 2022-12-23 DIAGNOSIS — J449 Chronic obstructive pulmonary disease, unspecified: Secondary | ICD-10-CM | POA: Diagnosis not present

## 2022-12-23 DIAGNOSIS — E440 Moderate protein-calorie malnutrition: Secondary | ICD-10-CM

## 2022-12-23 DIAGNOSIS — I160 Hypertensive urgency: Secondary | ICD-10-CM | POA: Diagnosis not present

## 2022-12-23 DIAGNOSIS — N179 Acute kidney failure, unspecified: Secondary | ICD-10-CM | POA: Diagnosis not present

## 2022-12-23 LAB — HEMOGLOBIN AND HEMATOCRIT, BLOOD
HCT: 22 % — ABNORMAL LOW (ref 36.0–46.0)
HCT: 25.3 % — ABNORMAL LOW (ref 36.0–46.0)
HCT: 25.8 % — ABNORMAL LOW (ref 36.0–46.0)
HCT: 24 % — ABNORMAL LOW (ref 36.0–46.0)
Hemoglobin: 7.5 g/dL — ABNORMAL LOW (ref 12.0–15.0)
Hemoglobin: 8.3 g/dL — ABNORMAL LOW (ref 12.0–15.0)
Hemoglobin: 8.4 g/dL — ABNORMAL LOW (ref 12.0–15.0)
Hemoglobin: 7.9 g/dL — ABNORMAL LOW (ref 12.0–15.0)

## 2022-12-23 LAB — GLUCOSE, CAPILLARY
Glucose-Capillary: 113 mg/dL — ABNORMAL HIGH (ref 70–99)
Glucose-Capillary: 151 mg/dL — ABNORMAL HIGH (ref 70–99)
Glucose-Capillary: 140 mg/dL — ABNORMAL HIGH (ref 70–99)
Glucose-Capillary: 155 mg/dL — ABNORMAL HIGH (ref 70–99)

## 2022-12-23 LAB — PREPARE RBC (CROSSMATCH)

## 2022-12-23 MED ORDER — SODIUM CHLORIDE 0.9% IV SOLUTION: Freq: Once | INTRAVENOUS | Status: DC

## 2022-12-23 NOTE — Progress Notes (Signed)
Urology Inpatient Progress Note  Subjective: No acute events overnight.  She is afebrile and tachycardia is improving. She is s/p 1 unit PRBCs last night.  Hemoglobin up today, 8.3. Foley catheter in place draining red urine with tiny clot fragments. She reports no pain.  Anti-infectives: Anti-infectives (From admission, onward)    None       Current Facility-Administered Medications  Medication Dose Route Frequency Provider Last Rate Last Admin   0.9 %  sodium chloride infusion (Manually program via Guardrails IV Fluids)   Intravenous Once Arnetha Courser, MD       0.9 %  sodium chloride infusion   Intravenous Continuous Mansy, Jan A, MD 100 mL/hr at 12/23/22 0400 Infusion Verify at 12/23/22 0400   acetaminophen (TYLENOL) tablet 650 mg  650 mg Oral Q6H PRN Mansy, Jan A, MD       Or   acetaminophen (TYLENOL) suppository 650 mg  650 mg Rectal Q6H PRN Mansy, Jan A, MD       amLODipine (NORVASC) tablet 10 mg  10 mg Oral Daily Mansy, Jan A, MD   10 mg at 12/22/22 1021   Chlorhexidine Gluconate Cloth 2 % PADS 6 each  6 each Topical Daily Arnetha Courser, MD   6 each at 12/22/22 1729   cyanocobalamin (VITAMIN B12) tablet 1,000 mcg  1,000 mcg Oral Daily Mansy, Jan A, MD   1,000 mcg at 12/22/22 1021   Fe Fum-Vit C-Vit B12-FA (TRIGELS-F FORTE) capsule 1 capsule  1 capsule Oral QPC breakfast Arnetha Courser, MD   1 capsule at 12/22/22 1333   feeding supplement (ENSURE ENLIVE / ENSURE PLUS) liquid 237 mL  237 mL Oral BID BM Arnetha Courser, MD       fesoterodine (TOVIAZ) tablet 4 mg  4 mg Oral Daily Mansy, Jan A, MD   4 mg at 12/22/22 1333   fluticasone furoate-vilanterol (BREO ELLIPTA) 100-25 MCG/ACT 1 puff  1 puff Inhalation Daily Mansy, Jan A, MD   1 puff at 12/23/22 1027   And   umeclidinium bromide (INCRUSE ELLIPTA) 62.5 MCG/ACT 1 puff  1 puff Inhalation Daily Mansy, Jan A, MD   1 puff at 12/23/22 1027   hydrALAZINE (APRESOLINE) injection 10 mg  10 mg Intravenous Q6H PRN Mansy, Jan A, MD   10 mg at  12/22/22 6045   insulin aspart (novoLOG) injection 0-5 Units  0-5 Units Subcutaneous QHS Mansy, Jan A, MD       insulin aspart (novoLOG) injection 0-9 Units  0-9 Units Subcutaneous TID WC Mansy, Jan A, MD   2 Units at 12/22/22 1333   labetalol (NORMODYNE) injection 20 mg  20 mg Intravenous Q3H PRN Mansy, Jan A, MD   20 mg at 12/22/22 0429   lisinopril (ZESTRIL) tablet 40 mg  40 mg Oral Daily Mansy, Jan A, MD   40 mg at 12/22/22 1020   magnesium hydroxide (MILK OF MAGNESIA) suspension 30 mL  30 mL Oral Daily PRN Mansy, Jan A, MD       montelukast (SINGULAIR) tablet 10 mg  10 mg Oral QHS Mansy, Jan A, MD   10 mg at 12/22/22 2359   morphine (PF) 2 MG/ML injection 2 mg  2 mg Intravenous Q4H PRN Mansy, Jan A, MD       multivitamin with minerals tablet 1 tablet  1 tablet Oral Daily Arnetha Courser, MD   1 tablet at 12/22/22 1728   ondansetron (ZOFRAN) tablet 4 mg  4 mg Oral Q6H PRN Mansy, Jan A,  MD       Or   ondansetron (ZOFRAN) injection 4 mg  4 mg Intravenous Q6H PRN Mansy, Jan A, MD       pantoprazole (PROTONIX) EC tablet 40 mg  40 mg Oral Daily Mansy, Jan A, MD   40 mg at 12/22/22 1021   traZODone (DESYREL) tablet 25 mg  25 mg Oral QHS PRN Mansy, Jan A, MD       Objective: Vital signs in last 24 hours: Temp:  [97.8 F (36.6 C)-100 F (37.8 C)] 98.9 F (37.2 C) (08/29 0756) Pulse Rate:  [104-121] 107 (08/29 0756) Resp:  [16-20] 16 (08/29 0756) BP: (109-150)/(54-70) 139/67 (08/29 0756) SpO2:  [97 %-100 %] 97 % (08/29 0756)  Intake/Output from previous day: 08/28 0701 - 08/29 0700 In: 3445.5 [P.O.:580; I.V.:2184.7; Blood:320.8] Out: 2425 [Urine:2425] Intake/Output this shift: No intake/output data recorded.  Physical Exam Vitals and nursing note reviewed.  Constitutional:      General: She is not in acute distress.    Appearance: She is not ill-appearing, toxic-appearing or diaphoretic.  HENT:     Head: Normocephalic and atraumatic.  Pulmonary:     Effort: Pulmonary effort is  normal. No respiratory distress.  Skin:    General: Skin is warm and dry.  Neurological:     Mental Status: She is alert and oriented to person, place, and time.  Psychiatric:        Mood and Affect: Mood normal.        Behavior: Behavior normal.    Lab Results:  Recent Labs    12/21/22 2136 12/22/22 0340 12/22/22 0841 12/23/22 0314 12/23/22 0928  WBC 8.5 15.7*  --   --   --   HGB 11.1* 9.3*   < > 7.5* 8.3*  HCT 35.1* 29.3*   < > 22.0* 25.3*  PLT 306 240  --   --   --    < > = values in this interval not displayed.   BMET Recent Labs    12/21/22 2136 12/22/22 0340  NA 132* 134*  K 3.6 3.8  CL 100 103  CO2 19* 20*  GLUCOSE 297* 283*  BUN 24* 26*  CREATININE 1.29* 1.32*  CALCIUM 8.4* 8.3*   Assessment & Plan: 82 year old female readmitted with gross hematuria after undergoing TURBT with Dr. Lonna Cobb for management of a large bladder tumor.  She continues to have gross hematuria this morning, however her blood counts are rising following transfusion and there is no evidence of clot occlusion of her catheter.  No plans for urgent urologic intervention at this time.  She may have a diet from our perspective.  Out of an abundance of caution, will make her n.p.o. again at midnight and reassess tomorrow morning.  Continue to recommend gentle manual irrigation of her Foley catheter to clear obstructing clots as needed.  Carman Ching, PA-C 12/23/2022

## 2022-12-23 NOTE — TOC Initial Note (Signed)
Transition of Care [facility]) - Initial/Assessment Note    Patient Details  Name: Regina Perkins MRN: [id number] Date of Birth: 11-16-1940  [facility] of [facility]) CM/SW Contact:    Truddie Hidden, RN Phone Number: 12/23/2022, 2:07 PM  Clinical Narrative:                 Admitted for: Gross hematuria  Admitted from:Home, lives with her adult grandaughter and great grandchild Pharmacy: [facility] Current home health/prior home health/DME:[facility]: no preference if need arises.    Expected Discharge Plan: Home/Self Care Barriers to Discharge: Continued Medical Work up   Patient Goals and CMS Choice Patient states their goals for this hospitalization and ongoing recovery are:: Home          Expected Discharge Plan and Services                                              Prior Living Arrangements/Services   Lives with:: Other (Comment) (Adult Grandaughter and great grandchild)   Do you feel safe going back to the place where you live?: Yes      Need for Family Participation in Patient Care: Yes (Comment) Care giver support system in place?: Yes (comment)   Criminal Activity/Legal Involvement Pertinent to Current Situation/Hospitalization: No - Comment as needed  Activities of Daily Living Home Assistive Devices/Equipment: Eyeglasses, Cane (specify quad or straight), Dentures (specify type) ADL Screening (condition at time of admission) Patient's cognitive ability adequate to safely complete daily activities?: Yes Is the patient deaf or have difficulty hearing?: No Does the patient have difficulty seeing, even when wearing glasses/contacts?: Yes Does the patient have difficulty concentrating, remembering, or making decisions?: No Patient able to express need for assistance with ADLs?: Yes Does the patient have difficulty dressing or bathing?: No Independently performs ADLs?: Yes (appropriate for developmental age) Does the patient  have difficulty walking or climbing stairs?: Yes Weakness of Legs: None Weakness of Arms/Hands: None  Permission Sought/Granted                  Emotional Assessment Appearance:: Appears stated age Attitude/Demeanor/Rapport: Engaged, Gracious Affect (typically observed): Accepting Orientation: : Oriented to Self, Oriented to Place, Oriented to  Time, Oriented to Situation Alcohol / Substance Use: Not Applicable Psych Involvement: No (comment)  Admission diagnosis:  Tachycardia [R00.0] Gross hematuria [R31.0] Urinary catheter dysfunction, initial encounter (HCC) [T83.018A] Patient Active Problem List   Diagnosis Date Noted   Gross hematuria 12/22/2022   Hypertensive urgency 12/22/2022   AKI (acute kidney injury) (HCC) 12/22/2022   Chronic obstructive pulmonary disease (COPD) (HCC) 12/22/2022   GERD without esophagitis 12/22/2022   Bladder mass 10/06/2022   Malnutrition of moderate degree (HCC) 07/29/2022   Gastrointestinal hemorrhage 07/27/2022   Leukocytosis 07/27/2022   Normocytic anemia 07/21/2022   Encounter for antineoplastic immunotherapy 07/14/2022   Goals of care, counseling/discussion 06/28/2022   Malnutrition (HCC) 06/28/2022   Shortness of breath 05/26/2022   Malignant neoplasm of lung (HCC) 05/26/2022   Lung mass 05/25/2022   Pericardial effusion 05/25/2022   Acute pulmonary embolism (HCC) 05/22/2022   Chronic kidney disease, stage 3a (HCC) 05/22/2022   HTN (hypertension) 05/22/2022   Type II diabetes mellitus with renal manifestations (HCC) 05/22/2022   Hypokalemia 05/22/2022   Tobacco abuse 05/22/2022   Hypomagnesemia 05/22/2022   Nuclear sclerotic cataract of left eye 01/30/2021  Pseudoexfoliation (PXF) glaucoma of both eyes 01/30/2021   Atherosclerosis of native arteries of extremity with intermittent claudication (HCC) 03/12/2019   Essential hypertension 03/12/2019   Diabetes (HCC) 03/12/2019   Sepsis (HCC) 12/30/2015   PCP:  [facility] Pharmacy:   [id number] [place], [place] - [address] AT [facility] [address] [place] [address] Phone: [phone] Fax: [phone]  [facility] #[id number] [place], [place] - [address] AT [facility] [address] [place] [address] Phone: [phone] Fax: [phone]     Social Determinants of Health (SDOH) Social History: SDOH Screenings   Food Insecurity: No Food Insecurity (12/22/2022)  Housing: Low Risk  (12/22/2022)  Transportation Needs: No Transportation Needs (12/22/2022)  Utilities: Not At Risk (12/22/2022)  Depression (PHQ2-9): Low Risk  (06/28/2022)  Financial Resource Strain: Low Risk  (06/28/2022)  Stress: No Stress Concern Present (06/28/2022)  Tobacco Use: High Risk (12/21/2022)   SDOH Interventions:     Readmission Risk Interventions    12/23/2022    2:05 PM  Readmission Risk Prevention Plan  Transportation Screening Complete  PCP or Specialist Appt within 3-5 Days Complete  HRI or Home Care Consult Complete  Social Work Consult for Recovery Care Planning/Counseling Complete  Palliative Care Screening Not Applicable  Medication Review Oceanographer) Complete

## 2022-12-23 NOTE — Progress Notes (Signed)
Current Hgb 8.3  Clarified with Dr Zoe Lan 1 unit PRBC order Per MD-Hold transfusion for now

## 2022-12-23 NOTE — Inpatient Diabetes Management (Signed)
Inpatient Diabetes Program Recommendations  AACE/ADA: New Consensus Statement on Inpatient Glycemic Control (2015)  Target Ranges:  Prepandial:   less than 140 mg/dL      Peak postprandial:   less than 180 mg/dL (1-2 hours)      Critically ill patients:  140 - 180 mg/dL   Lab Results  Component Value Date   GLUCAP 113 (H) 12/23/2022   HGBA1C 6.7 (H) 12/21/2022    Review of Glycemic Control  Latest Reference Range & Units 12/22/22 17:00 12/22/22 17:25 12/22/22 17:53 12/22/22 20:52 12/23/22 08:21  Glucose-Capillary 70 - 99 mg/dL 60 (L) 59 (L) 130 (H) 865 (H) 113 (H)  Diabetes history: DM 2 Outpatient Diabetes medications: Metformin 1000 mg bid Current orders for Inpatient glycemic control:  Novolog 0-9 units tid with meals and HS  Inpatient Diabetes Program Recommendations:    Note low CBG yesterday.  Consider reducing Novolog correction to "very sensitive" 0-6 units tid with meals and only cover CBG's if >150 mg/dL.   Thanks,  Beryl Meager, RN, BC-ADM Inpatient Diabetes Coordinator Pager [phone]  (8a-5p)

## 2022-12-23 NOTE — Assessment & Plan Note (Signed)
Continue to have gross hematuria with hemoglobin now improving, at 8.3, it decreased to 6.5 yesterday evening s/p 1 unit of PRBC Urology stopped CBI and is advising gentle manual irrigation. 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 -Continue with gentle manual irrigation as advised by urology -Maintain Foley catheter -Monitor hemoglobin -Transfuse if below 7

## 2022-12-23 NOTE — Progress Notes (Signed)
Progress Note   Patient: Regina Perkins RUE:[id number] DOB: 20-Jun-1940 DOA: 12/21/2022     1 DOS: the patient was seen and examined on 12/23/2022   Brief hospital course: Taken from prior notes.  Aliana L Schwering is a 82 y.o. female with medical history significant for hypertension, dyslipidemia, lung cancer, PE, sepsis, and type II diabetes mellitus, who presented to the emergency room with acute onset of persistent hematuria.  Patient with recent concern of bladder tumor s/p TURP with urology earlier in the day, she was discharged home after the procedure with Foley catheter in place.  Due to worsening hematuria and leakage around the catheter she came back to ED.  On presentation vitals were stable, hemoglobin 11.1, pseudohyponatremia secondary to hyperglycemia, mild AKI with creatinine at 1.29 with baseline around 1.  Patient received a bolus of LR and urology was consulted.  Patient was started on CBI.  8/28: Blood pressure elevated at 181/82, leukocytosis to 15.7 and hemoglobin decreased to 9.3.  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  Urology stopped CBI and advising gentle manual irrigation as needed.  8/29: Patient continued to have significant hematuria, hemoglobin dropped to 6.6 yesterday evening s/p 1 unit of PRBC, hemoglobin at 7.5 this morning. Urology is recommending continue manual irrigation, no OR today. Will be n.p.o. after midnight and they will reevaluate tomorrow morning if she need to go back to the OR or not.   Assessment and Plan: * Gross hematuria Continue to have gross hematuria with hemoglobin now improving, at 8.3, it decreased to 6.5 yesterday evening s/p 1 unit of PRBC Urology stopped CBI and is advising gentle manual irrigation. 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 -Continue with gentle manual irrigation as advised by urology -Maintain Foley catheter -Monitor hemoglobin -Transfuse if below  7  AKI (acute kidney injury) (HCC) - This is likely prerenal secondary to volume depletion and mild dehydration. -Continue with IV fluid -Monitor renal function -Avoid nephrotoxin  Hypertensive urgency Blood pressure only mildly elevated. -Continue home amlodipine and Zestril for now, we will hold ACE inhibitor if renal function continued to get worse, currently stable. -As needed labetalol  GERD without esophagitis - We will continue PPI therapy.  Chronic obstructive pulmonary disease (COPD) (HCC) - The patient will be continued on his inhalers.   Malnutrition of moderate degree [facility]) Dietitian consult   Subjective: Patient was happy that she do not need to go to the OR this morning.  Denies any pain.  Continue to have gross hematuria.  Physical Exam: Vitals:   12/23/22 0600 12/23/22 0720 12/23/22 0756 12/23/22 1210  BP:   139/67 (!) 148/62  Pulse:   (!) 107 (!) 110  Resp: 17 17 16 18   Temp:   98.9 F (37.2 C) 98.6 F (37 C)  TempSrc:   Oral Oral  SpO2:   97% 99%  Weight:      Height:       General.  Malnourished elderly lady, in no acute distress. Pulmonary.  Lungs clear bilaterally, normal respiratory effort. CV.  Regular rate and rhythm, no JVD, rub or murmur. Abdomen.  Soft, nontender, nondistended, BS positive. CNS.  Alert and oriented .  No focal neurologic deficit. Extremities.  No edema, no cyanosis, pulses intact and symmetrical. Psychiatry.  Judgment and insight appears normal. .   Data Reviewed: Prior data reviewed  Family Communication: No family at bedside  Disposition: Status is: Inpatient-continue to have gross hematuria   Planned Discharge Destination:  Home  DVT prophylaxis.  SCDs Time spent: 40  minutes  This record has been created using Conservation officer, historic buildings. Errors have been sought and corrected,but may not always be located. Such creation errors do not reflect on the standard of care.   Author: Arnetha Courser,  MD 12/23/2022 2:54 PM  For on call review [email or web address].

## 2022-12-23 NOTE — Assessment & Plan Note (Signed)
-  Dietitian consult 

## 2022-12-23 NOTE — Plan of Care (Signed)
?  Problem: Clinical Measurements: ?Goal: Ability to maintain clinical measurements within normal limits will improve ?Outcome: Progressing ?Goal: Diagnostic test results will improve ?Outcome: Progressing ?  ?Problem: Nutrition: ?Goal: Adequate nutrition will be maintained ?Outcome: Progressing ?  ?Problem: Safety: ?Goal: Ability to remain free from injury will improve ?Outcome: Progressing ?  ?

## 2022-12-23 NOTE — Assessment & Plan Note (Signed)
-   This is likely prerenal secondary to volume depletion and mild dehydration. -Continue with IV fluid -Monitor renal function -Avoid nephrotoxin

## 2022-12-23 NOTE — Progress Notes (Signed)
Mobility Specialist - Progress Note   12/23/22 1200  Mobility  Activity Ambulated with assistance in hallway;Transferred from bed to chair  Level of Assistance Standby assist, set-up cues, supervision of patient - no hands on  Assistive Device Cane  Distance Ambulated (ft) 70 ft  Activity Response Tolerated well  $Mobility charge 1 Mobility     Pre-mobility: 112 HR, 98% SpO2 During mobility: 131-138maxHR, 94% SpO2 Post-mobility: 115 HR, 96% SpO2   Pt lying in bed upon arrival, utilizing RA. Pt agreeable to activity. Completed bed mobility independently. Mild dizziness upon sitting that did resolve. STS and ambulation with minG + cane. Denied dizziness during ambulation. Distance limited d/t fatigue and HR elevating to 130s (with a max HR of 142 bpm upon returning to room). Pt voiced SOB but O2 maintained high 90s throughout session. Pt returned to chair with alarm set, needs in reach. HR back in 110s post-session. RN notified.    Filiberto Pinks Mobility Specialist 12/23/22, 12:07 PM

## 2022-12-23 NOTE — Assessment & Plan Note (Signed)
Blood pressure only mildly elevated. -Continue home amlodipine and Zestril for now, we will hold ACE inhibitor if renal function continued to get worse, currently stable. -As needed labetalol

## 2022-12-24 DIAGNOSIS — R310 Gross hematuria: Secondary | ICD-10-CM | POA: Diagnosis not present

## 2022-12-24 LAB — BASIC METABOLIC PANEL WITH GFR
Anion gap: 12 (ref 5–15)
BUN: 12 mg/dL (ref 8–23)
CO2: 26 mmol/L (ref 22–32)
Calcium: 9 mg/dL (ref 8.9–10.3)
Chloride: 104 mmol/L (ref 98–111)
Creatinine, Ser: 0.88 mg/dL (ref 0.44–1.00)
GFR, Estimated: >60 mL/min
Glucose, Bld: 98 mg/dL (ref 70–99)
Potassium: 3.4 mmol/L — ABNORMAL LOW (ref 3.5–5.1)
Sodium: 139 mmol/L (ref 135–145)

## 2022-12-24 LAB — CBC
HCT: 24.1 % — ABNORMAL LOW (ref 36.0–46.0)
Hemoglobin: 7.7 g/dL — ABNORMAL LOW (ref 12.0–15.0)
MCH: 27.9 pg (ref 26.0–34.0)
MCHC: 32 g/dL (ref 30.0–36.0)
MCV: 87.3 fL (ref 80.0–100.0)
Platelets: 173 10*3/uL (ref 150–400)
RBC: 2.76 MIL/uL — ABNORMAL LOW (ref 3.87–5.11)
RDW: 18.2 % — ABNORMAL HIGH (ref 11.5–15.5)
WBC: 10.3 10*3/uL (ref 4.0–10.5)
nRBC: 0 % (ref 0.0–0.2)

## 2022-12-24 LAB — HEMOGLOBIN AND HEMATOCRIT, BLOOD
HCT: 26.2 % — ABNORMAL LOW (ref 36.0–46.0)
HCT: 38 % (ref 36.0–46.0)
HCT: 36.9 % (ref 36.0–46.0)
Hemoglobin: 8.6 g/dL — ABNORMAL LOW (ref 12.0–15.0)
Hemoglobin: 12.9 g/dL (ref 12.0–15.0)
Hemoglobin: 12.7 g/dL (ref 12.0–15.0)

## 2022-12-24 LAB — PREPARE RBC (CROSSMATCH)

## 2022-12-24 LAB — GLUCOSE, CAPILLARY
Glucose-Capillary: 110 mg/dL — ABNORMAL HIGH (ref 70–99)
Glucose-Capillary: 122 mg/dL — ABNORMAL HIGH (ref 70–99)
Glucose-Capillary: 138 mg/dL — ABNORMAL HIGH (ref 70–99)
Glucose-Capillary: 153 mg/dL — ABNORMAL HIGH (ref 70–99)

## 2022-12-24 MED ORDER — POTASSIUM CHLORIDE CRYS ER 20 MEQ PO TBCR
20.0000 meq | EXTENDED_RELEASE_TABLET | Freq: Once | ORAL | Status: AC
  Administered 2022-12-24: 20 meq via ORAL
  Filled 2022-12-24: qty 1

## 2022-12-24 MED ORDER — SODIUM CHLORIDE 0.9% IV SOLUTION: Freq: Once | INTRAVENOUS | Status: AC

## 2022-12-24 NOTE — Progress Notes (Signed)
Urology Consult Follow Up  Subjective: Patient resting comfortably in bed with no complaints of bladder pain.  VSS afebrile  Serum creatinine 0.88.    Good UOP w/ Grade III hematuria.  Hemoglobin/hematocrit 7.7/24.1-stable  Anti-infectives: Anti-infectives (From admission, onward)    None       Current Facility-Administered Medications  Medication Dose Route Frequency Provider Last Rate Last Admin   0.9 %  sodium chloride infusion (Manually program via Guardrails IV Fluids)   Intravenous Once Arnetha Courser, MD       0.9 %  sodium chloride infusion   Intravenous Continuous Mansy, Jan A, MD 100 mL/hr at 12/23/22 2151 New Bag at 12/23/22 2151   acetaminophen (TYLENOL) tablet 650 mg  650 mg Oral Q6H PRN Mansy, Jan A, MD   650 mg at 12/23/22 2333   Or   acetaminophen (TYLENOL) suppository 650 mg  650 mg Rectal Q6H PRN Mansy, Jan A, MD       amLODipine (NORVASC) tablet 10 mg  10 mg Oral Daily Mansy, Jan A, MD   10 mg at 12/23/22 1218   Chlorhexidine Gluconate Cloth 2 % PADS 6 each  6 each Topical Daily Arnetha Courser, MD   6 each at 12/23/22 1700   cyanocobalamin (VITAMIN B12) tablet 1,000 mcg  1,000 mcg Oral Daily Mansy, Jan A, MD   1,000 mcg at 12/23/22 1218   Fe Fum-Vit C-Vit B12-FA (TRIGELS-F FORTE) capsule 1 capsule  1 capsule Oral QPC breakfast Arnetha Courser, MD   1 capsule at 12/23/22 1218   feeding supplement (ENSURE ENLIVE / ENSURE PLUS) liquid 237 mL  237 mL Oral BID BM Arnetha Courser, MD   237 mL at 12/23/22 1218   fesoterodine (TOVIAZ) tablet 4 mg  4 mg Oral Daily Mansy, Jan A, MD   4 mg at 12/23/22 1218   fluticasone furoate-vilanterol (BREO ELLIPTA) 100-25 MCG/ACT 1 puff  1 puff Inhalation Daily Mansy, Jan A, MD   1 puff at 12/23/22 1027   And   umeclidinium bromide (INCRUSE ELLIPTA) 62.5 MCG/ACT 1 puff  1 puff Inhalation Daily Mansy, Jan A, MD   1 puff at 12/23/22 1027   hydrALAZINE (APRESOLINE) injection 10 mg  10 mg Intravenous Q6H PRN Mansy, Jan A, MD   10 mg at 12/22/22  2956   insulin aspart (novoLOG) injection 0-5 Units  0-5 Units Subcutaneous QHS Mansy, Jan A, MD       insulin aspart (novoLOG) injection 0-9 Units  0-9 Units Subcutaneous TID WC Mansy, Jan A, MD   2 Units at 12/22/22 1333   labetalol (NORMODYNE) injection 20 mg  20 mg Intravenous Q3H PRN Mansy, Jan A, MD   20 mg at 12/22/22 0429   lisinopril (ZESTRIL) tablet 40 mg  40 mg Oral Daily Mansy, Jan A, MD   40 mg at 12/23/22 1218   magnesium hydroxide (MILK OF MAGNESIA) suspension 30 mL  30 mL Oral Daily PRN Mansy, Jan A, MD       montelukast (SINGULAIR) tablet 10 mg  10 mg Oral QHS Mansy, Jan A, MD   10 mg at 12/23/22 2147   morphine (PF) 2 MG/ML injection 2 mg  2 mg Intravenous Q4H PRN Mansy, Jan A, MD       multivitamin with minerals tablet 1 tablet  1 tablet Oral Daily Arnetha Courser, MD   1 tablet at 12/23/22 1218   ondansetron (ZOFRAN) tablet 4 mg  4 mg Oral Q6H PRN Mansy, Vernetta Honey, MD  Or   ondansetron (ZOFRAN) injection 4 mg  4 mg Intravenous Q6H PRN Mansy, Jan A, MD       pantoprazole (PROTONIX) EC tablet 40 mg  40 mg Oral Daily Mansy, Jan A, MD   40 mg at 12/23/22 1218   potassium chloride SA (KLOR-CON M) CR tablet 20 mEq  20 mEq Oral Once Darlin Drop, DO       traZODone (DESYREL) tablet 25 mg  25 mg Oral QHS PRN Mansy, Jan A, MD         Objective: Vital signs in last 24 hours: Temp:  [98.1 F (36.7 C)-99.4 F (37.4 C)] 98.1 F (36.7 C) (08/30 0807) Pulse Rate:  [89-110] 93 (08/30 0807) Resp:  [16-18] 16 (08/30 0807) BP: (126-156)/(59-69) 156/69 (08/30 0807) SpO2:  [97 %-100 %] 100 % (08/30 0807)  Intake/Output from previous day: 08/29 0701 - 08/30 0700 In: 160 [P.O.:160] Out: 3750 [Urine:3750] Intake/Output this shift: No intake/output data recorded.   Physical Exam Vitals and nursing note reviewed.  Constitutional:      General: She is not in acute distress.    Appearance: Normal appearance. She is not toxic-appearing.  HENT:     Head: Normocephalic and  atraumatic.     Mouth/Throat:     Mouth: Mucous membranes are moist.  Eyes:     Extraocular Movements: Extraocular movements intact.     Conjunctiva/sclera: Conjunctivae normal.  Pulmonary:     Effort: Pulmonary effort is normal.  Abdominal:     General: Abdomen is flat. There is no distension.     Palpations: Abdomen is soft.     Tenderness: There is no abdominal tenderness. There is no guarding or rebound.  Genitourinary:    Comments: Three way Foley in place draining clear dark pink urine (Grade III hematuria)  Musculoskeletal:     Cervical back: Normal range of motion.  Skin:    General: Skin is warm and dry.  Neurological:     General: No focal deficit present.     Mental Status: She is alert. Mental status is at baseline. She is disoriented.  Psychiatric:        Mood and Affect: Mood normal.        Behavior: Behavior normal.        Judgment: Judgment normal.    Lab Results:  Recent Labs    12/22/22 0340 12/22/22 0841 12/23/22 2146 12/24/22 0313  WBC 15.7*  --   --  10.3  HGB 9.3*   < > 7.9* 7.7*  HCT 29.3*   < > 24.0* 24.1*  PLT 240  --   --  173   < > = values in this interval not displayed.   BMET Recent Labs    12/22/22 0340 12/24/22 0313  NA 134* 139  K 3.8 3.4*  CL 103 104  CO2 20* 26  GLUCOSE 283* 98  BUN 26* 12  CREATININE 1.32* 0.88  CALCIUM 8.3* 9.0   PT/INR No results for input(s): "LABPROT", "INR" in the last 72 hours. ABG No results for input(s): "PHART", "HCO3" in the last 72 hours.  Invalid input(s): "PCO2", "PO2"  Studies/Results: No results found.   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  A three-way Foley was placed in the ED and she was put on CBI overnight.  CBI was discontinued as there was concern that she would be  at risk for a bladder perforation and gentle manual irrigation was continued.    She received  one unit of blood on 12/23/2022  Her urine continued to improve during her admission and it is currently dark clear pink (Grade III hematuria)  Plan: -stable for discharge from urological standpoint, if 930 H/H stable  -discharge with Foley - has appointment on 09/03 for Foley removal    LOS: 2 days    [facility] 12/24/2022

## 2022-12-24 NOTE — Progress Notes (Addendum)
Progress Note   Patient: Regina Perkins:[id number] DOB: 05/13/1940 DOA: 12/21/2022     2 DOS: the patient was seen and examined on 12/24/2022   Brief hospital course: Taken from prior notes.  Regina Perkins is a 82 y.o. female with medical history significant for hypertension, dyslipidemia, lung cancer, PE, sepsis, and type II diabetes mellitus, who presented to the emergency room with acute onset of persistent hematuria.  Patient with recent concern of bladder tumor s/p TURP with urology earlier in the day, she was discharged home after the procedure with Foley catheter in place.  Due to worsening hematuria and leakage around the catheter she came back to ED.  On presentation vitals were stable, hemoglobin 11.1, pseudohyponatremia secondary to hyperglycemia, mild AKI with creatinine at 1.29 with baseline around 1.  Patient received a bolus of LR and urology was consulted.  Patient was started on CBI.  8/28: Blood pressure elevated at 181/82, leukocytosis to 15.7 and hemoglobin decreased to 9.3.  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  Urology stopped CBI and advising gentle manual irrigation as needed.  8/29: Patient continued to have significant hematuria, hemoglobin dropped to 6.6 yesterday evening s/p 1 unit of PRBC, hemoglobin at 7.5 this morning. Urology is recommending continue manual irrigation, no OR today. Will be n.p.o. after midnight and they will reevaluate tomorrow morning if she need to go back to the OR or not.  12/24/2022: The patient was seen and examined at bedside.  Persistent hematuria with Foley catheter in place.  2 units PRBC ordered to be transfused.  She denies having any significant pain.  Seen by urology, no further plan inpatient thus far.  Will repeat CBC post blood transfusion.  Assessment and Plan: * Gross hematuria Continue to have gross hematuria with hemoglobin now improving, at 8.3, it decreased to 6.5  yesterday evening s/p 1 unit of PRBC Urology stopped CBI and is advising gentle manual irrigation. 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 -Continue with gentle manual irrigation as advised by urology -Maintain Foley catheter -Monitor hemoglobin -Transfuse if below 7  AKI (acute kidney injury) (HCC) - This is likely prerenal secondary to volume depletion and mild dehydration. -Continue with IV fluid -Monitor renal function -Avoid nephrotoxin  Hypertensive urgency Blood pressure only mildly elevated. -Continue home amlodipine and Zestril for now, we will hold ACE inhibitor if renal function continued to get worse, currently stable. -As needed labetalol  GERD without esophagitis - We will continue PPI therapy.  Chronic obstructive pulmonary disease (COPD) (HCC) - The patient will be continued on his inhalers.   Malnutrition of moderate degree [facility]) Dietitian consult   Acute blood loss anemia from gross hematuria, resolved Post blood transfusion   Physical Exam: Vitals:   12/24/22 1122 12/24/22 1137 12/24/22 1449 12/24/22 1507  BP: (!) 153/73 (!) 147/76 (!) 145/79 (!) 164/84  Pulse: 100 (!) 103 (!) 115 (!) 108  Resp: 20 20 20 20   Temp: 99.1 F (37.3 C) 98.4 F (36.9 C) 99.8 F (37.7 C) 99.7 F (37.6 C)  TempSrc:   Oral   SpO2:      Weight:      Height:       General.  Malnourished elderly lady, in no acute distress. Pulmonary.  Lungs clear bilaterally, normal respiratory effort. CV.  Regular rate and rhythm, no JVD, rub or murmur. Abdomen.  Soft, nontender, nondistended, BS positive. CNS.  Alert and oriented .  No focal neurologic deficit. Extremities.  No edema, no cyanosis, pulses intact and symmetrical. Psychiatry.  Judgment and insight appears normal. .   Data Reviewed: Prior data reviewed  Family Communication: No family at bedside  Disposition: Status is: Inpatient-continue to have gross hematuria   Planned Discharge Destination:  Home  DVT prophylaxis.  SCDs Time spent: 40  minutes  This record has been created using Conservation officer, historic buildings. Errors have been sought and corrected,but may not always be located. Such creation errors do not reflect on the standard of care.   Author: Darlin Drop, DO 12/24/2022 5:45 PM  For on call review [email or web address].

## 2022-12-24 NOTE — Plan of Care (Signed)

## 2022-12-24 NOTE — Care Management Important Message (Signed)
Important Message  Patient Details  Name: ALEGRA HEDGER MRN: [id number] Date of Birth: November 17, 1940   Medicare Important Message Given:  Yes     Johnell Comings 12/24/2022, 10:54 AM

## 2022-12-24 NOTE — Progress Notes (Signed)
RN paged on call hospitalist Lindajo Royal, MD @ [phone] regarding transfusion order. Transfuse 1 Unit PRBC: transfusion indications hemoglobin 8gm/dl or less for ongoing bleeding order placed at 0840 8/29. Patient hemoglobin trend on 8/29 since 0928 8.3 -> 8.4 -> 7.9 -> 7.7. Patient VSS last vitals BP 142/64, HR 94 NSR, RR 16, temp 98.59F, SpO2 98% on room air. Patient still has gross hematuria. Total foley output 1.3L for shift.    Per Dr. Arnetha Courser most recent note from 2:53pm on 8/29 - transfuse if below 7. Discussed with Dr. Lindajo Royal if transfusion is necessary according to hemoglobin level 7.9. Per Dr Para March "[facility] let's wait for that one and if mag transfuse is under 7.3." Current hemoglobin is 7.7 @ 0313. Hemoglobin lab order to be collected every 6 hours. Next collection @ 0930. Patient is resting, asymptomatic. Care ongoing.

## 2022-12-25 DIAGNOSIS — I160 Hypertensive urgency: Secondary | ICD-10-CM | POA: Diagnosis not present

## 2022-12-25 DIAGNOSIS — R310 Gross hematuria: Secondary | ICD-10-CM | POA: Diagnosis not present

## 2022-12-25 DIAGNOSIS — N179 Acute kidney failure, unspecified: Secondary | ICD-10-CM | POA: Diagnosis not present

## 2022-12-25 LAB — HEMOGLOBIN AND HEMATOCRIT, BLOOD
HCT: 35.6 % — ABNORMAL LOW (ref 36.0–46.0)
HCT: 35.5 % — ABNORMAL LOW (ref 36.0–46.0)
Hemoglobin: 12.2 g/dL (ref 12.0–15.0)
Hemoglobin: 12.2 g/dL (ref 12.0–15.0)

## 2022-12-25 LAB — GLUCOSE, CAPILLARY
Glucose-Capillary: 153 mg/dL — ABNORMAL HIGH (ref 70–99)
Glucose-Capillary: 160 mg/dL — ABNORMAL HIGH (ref 70–99)

## 2022-12-25 LAB — BASIC METABOLIC PANEL
Anion gap: 9 (ref 5–15)
BUN: 10 mg/dL (ref 8–23)
CO2: 24 mmol/L (ref 22–32)
Calcium: 8.4 mg/dL — ABNORMAL LOW (ref 8.9–10.3)
Chloride: 102 mmol/L (ref 98–111)
Creatinine, Ser: 0.81 mg/dL (ref 0.44–1.00)
GFR, Estimated: >60 mL/min (ref >60)
Glucose, Bld: 150 mg/dL — ABNORMAL HIGH (ref 70–99)
Potassium: 3 mmol/L — ABNORMAL LOW (ref 3.5–5.1)
Sodium: 135 mmol/L (ref 135–145)

## 2022-12-25 LAB — MAGNESIUM: Magnesium: 1.6 mg/dL — ABNORMAL LOW (ref 1.7–2.4)

## 2022-12-25 MED ORDER — POTASSIUM CHLORIDE CRYS ER 20 MEQ PO TBCR
40.0000 meq | EXTENDED_RELEASE_TABLET | ORAL | Status: AC
  Administered 2022-12-25 (×2): 40 meq via ORAL
  Filled 2022-12-25 (×2): qty 2

## 2022-12-25 MED ORDER — POTASSIUM CHLORIDE 10 MEQ/100ML IV SOLN
10.0000 meq | INTRAVENOUS | Status: AC
  Administered 2022-12-25 (×2): 10 meq via INTRAVENOUS
  Filled 2022-12-25 (×2): qty 100

## 2022-12-25 MED ORDER — MAGNESIUM SULFATE 2 GM/50ML IV SOLN
2.0000 g | Freq: Once | INTRAVENOUS | Status: AC
  Administered 2022-12-25: 2 g via INTRAVENOUS
  Filled 2022-12-25: qty 50

## 2022-12-25 NOTE — Discharge Summary (Signed)
Physician Discharge Summary   Patient: Regina Perkins MRN: [id number] DOB: Jun 24, 1940  Admit date:     12/21/2022  Discharge date: 12/25/22  Discharge Physician: Marrion Coy   [facility]P: [facility]   Recommendations at discharge:   Follow-up with PCP in 1 week Keep Foley catheter, follow-up with urology as previous scheduled.  Discharge Diagnoses: Principal Problem:   Gross hematuria Active Problems:   AKI (acute kidney injury) (HCC)   Hypertensive urgency   Malnutrition of moderate degree (HCC)   Chronic obstructive pulmonary disease (COPD) (HCC)   GERD without esophagitis  Resolved Problems:   * No resolved hospital problems. *  Hospital Course: Regina Perkins is a 82 y.o. female with medical history significant for hypertension, dyslipidemia, lung cancer, PE, sepsis, and type II diabetes mellitus, who presented to the emergency room with acute onset of persistent hematuria.  Patient with recent concern of bladder tumor with resection with urology earlier in the day, she was discharged home after the procedure with Foley catheter in place.  Due to worsening hematuria and leakage around the catheter she came back to ED.  On presentation vitals were stable, hemoglobin 11.1, pseudohyponatremia secondary to hyperglycemia, mild AKI with creatinine at 1.29 with baseline around 1.  Patient received a bolus of LR and urology was consulted.  Patient was started on CBI.  8/28: Blood pressure elevated at 181/82, leukocytosis to 15.7 and hemoglobin decreased to 9.3.  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  Urology stopped CBI and advising gentle manual irrigation as needed.  8/29: Patient continued to have significant hematuria, hemoglobin dropped to 6.6 yesterday evening s/p 1 unit of PRBC, hemoglobin at 7.5 this morning. Received another unit of blood transfusion.  Condition much improved today, minimal hematuria  from Foley catheter.  Hemoglobin went up to 12.2 and stable.  Urology has cleared patient for discharge.  Assessment and Plan:  Gross hematuria Invasive urothelial carcinoma post resection. Acute blood loss anemia. Condition had improved, no longer has significant hematuria.  Medically stable for discharge and cleared by urology.   AKI (acute kidney injury) (HCC) Hypokalemia  Hypomagnesemia. Renal function has normalized.  Potassium 3.0, we received 20 mEq of IV potassium and 80 mEq of oral potassium. Magnesium 1.6, will give 2 g of magnesium sulfate before discharge.   Hypertensive urgency Blood pressure more stable, resume home dose treatment.   GERD without esophagitis PPI.   Chronic obstructive pulmonary disease (COPD) (HCC) Resume home treatment.     Malnutrition of moderate degree (HCC) Encourage p.o. intake.        Consultants: Urology Procedures performed: Bladder tumor resection. Disposition: Home Diet recommendation:  Discharge Diet Orders (From admission, onward)     Start     Ordered   12/25/22 0000  Diet - low sodium heart healthy        12/25/22 1044           Cardiac diet DISCHARGE MEDICATION: Allergies as of 12/25/2022       Reactions   Ivp Dye [iodinated Contrast Media]    Shellfish Allergy Other (See Comments)   Reaction: unknown         Medication List     STOP taking these medications    Breo Ellipta 100-25 MCG/ACT Aepb Generic drug: fluticasone furoate-vilanterol   fluticasone 50 MCG/ACT nasal spray Commonly known as: FLONASE   furosemide 40 MG tablet Commonly known as: Lasix       TAKE  these medications    amLODipine 10 MG tablet Commonly known as: NORVASC Take 10 mg by mouth daily.   Breztri Aerosphere 160-9-4.8 MCG/ACT Aero Generic drug: Budeson-Glycopyrrol-Formoterol Inhale 1 puff into the lungs daily.   cyanocobalamin 1000 MCG tablet Commonly known as: VITAMIN B12 Take 1 tablet (1,000 mcg total) by mouth  daily.   lisinopril 40 MG tablet Commonly known as: ZESTRIL Take 40 mg by mouth daily.   metFORMIN 1000 MG tablet Commonly known as: GLUCOPHAGE Take 1,000 mg by mouth 2 (two) times daily with a meal.   montelukast 10 MG tablet Commonly known as: SINGULAIR Take 10 mg by mouth at bedtime.   omeprazole 40 MG capsule Commonly known as: PRILOSEC Take 1 capsule (40 mg total) by mouth in the morning and at bedtime.   Trelegy Ellipta 100-62.5-25 MCG/ACT Aepb Generic drug: Fluticasone-Umeclidin-Vilant Inhale into the lungs.   trospium 20 MG tablet Commonly known as: SANCTURA Take 1 tablet (20 mg total) by mouth 2 (two) times daily as needed (frequency,urgency,bladder spasm).        Follow-up Information     [facility] Follow up in 1 week(s).   Specialty: General Practice Contact information: [address] [place] [address] [phone]         Michiel Cowboy A, PA-C Follow up.   Specialty: Urology Why: as scheduled Contact information: [address] [place] [address] [phone]                Discharge Exam: Ceasar Mons Weights   12/21/22 2132  Weight: 52.6 kg   General exam: Appears calm and comfortable  Respiratory system: Clear to auscultation. Respiratory effort normal. Cardiovascular system: S1 & S2 heard, RRR. No JVD, murmurs, rubs, gallops or clicks. No pedal edema. Gastrointestinal system: Abdomen is nondistended, soft and nontender. No organomegaly or masses felt. Normal bowel sounds heard. Central nervous system: Alert and oriented. No focal neurological deficits. Extremities: Symmetric 5 x 5 power. Skin: No rashes, lesions or ulcers Psychiatry: Judgement and insight appear normal. Mood & affect appropriate.    Condition at discharge: good  The results of significant diagnostics from this hospitalization (including imaging, microbiology, ancillary and laboratory) are listed  below for reference.   Imaging Studies: DG OR UROLOGY CYSTO IMAGE ([facility] ONLY)  Result Date: 12/21/2022 There is no interpretation for this exam.  This order is for images obtained during a surgical procedure.  Please See "Surgeries" Tab for more information regarding the procedure.    Microbiology: Results for orders placed or performed in visit on 11/29/22  CULTURE, URINE COMPREHENSIVE     Status: None   Collection Time: 11/29/22  8:47 AM   Specimen: Urine   UR  Result Value Ref Range Status   Urine Culture, Comprehensive Final report  Final   Organism ID, Bacteria Comment  Final    Comment: No growth in 36 - 48 hours.  Microscopic Examination     Status: Abnormal   Collection Time: 11/29/22  8:47 AM   Urine  Result Value Ref Range Status   WBC, UA >30 (A) 0 - 5 /hpf Final   RBC, Urine >30 (A) 0 - 2 /hpf Final   Epithelial Cells (non renal) 0-10 0 - 10 /hpf Final   Bacteria, UA Many (A) None seen/Few Final    Labs: CBC: Recent Labs  Lab 12/21/22 2136 12/22/22 0340 12/22/22 3295 12/24/22 0313 12/24/22 1051 12/24/22 1956 12/24/22 2126 12/25/22 1884 12/25/22 1660  WBC 8.5 15.7*  --  10.3  --   --   --   --   --   NEUTROABS 7.4  --   --   --   --   --   --   --   --   HGB 11.1* 9.3*   < > 7.7* 8.6* 12.9 12.7 12.2 12.2  HCT 35.1* 29.3*   < > 24.1* 26.2* 38.0 36.9 35.6* 35.5*  MCV 89.8 89.6  --  87.3  --   --   --   --   --   PLT 306 240  --  173  --   --   --   --   --    < > = values in this interval not displayed.   Basic Metabolic Panel: Recent Labs  Lab 12/21/22 2136 12/22/22 0340 12/24/22 0313 12/25/22 0942  NA 132* 134* 139 135  K 3.6 3.8 3.4* 3.0*  CL 100 103 104 102  CO2 19* 20* 26 24  GLUCOSE 297* 283* 98 150*  BUN 24* 26* 12 10  CREATININE 1.29* 1.32* 0.88 0.81  CALCIUM 8.4* 8.3* 9.0 8.4*  MG  --   --   --  1.6*   Liver Function Tests: Recent Labs  Lab 12/21/22 2136  AST 31  ALT 22  ALKPHOS 57  BILITOT 0.4  PROT 6.9  ALBUMIN 3.7    CBG: Recent Labs  Lab 12/24/22 0807 12/24/22 1139 12/24/22 1532 12/24/22 2131 12/25/22 0856  GLUCAP 110* 122* 138* 153* 153*    Discharge time spent: greater than 30 minutes.  Signed: Marrion Coy, MD [facility] 12/25/2022

## 2022-12-25 NOTE — Plan of Care (Signed)

## 2022-12-27 LAB — TYPE AND SCREEN
ABO/RH(D): O POS
Antibody Screen: POSITIVE
Donor AG Type: NEGATIVE
Donor AG Type: NEGATIVE
Donor AG Type: NEGATIVE
Unit division: 0
Unit division: 0
Unit division: 0
Unit division: 0
Unit division: 0

## 2022-12-27 LAB — BPAM RBC
Blood Product Expiration Date: 202409252359
Blood Product Expiration Date: 202409252359
Blood Product Expiration Date: 202409252359
Blood Product Expiration Date: 202409252359
Blood Product Expiration Date: 202409252359
ISSUE DATE / TIME: 202408282231
ISSUE DATE / TIME: 202408301115
ISSUE DATE / TIME: 202408301448
ISSUE DATE / TIME: 202409012031
Unit Type and Rh: 5100
Unit Type and Rh: 5100
Unit Type and Rh: 5100
Unit Type and Rh: 5100
Unit Type and Rh: 5100

## 2022-12-28 ENCOUNTER — Ambulatory Visit (INDEPENDENT_AMBULATORY_CARE_PROVIDER_SITE_OTHER): Payer: Medicare Other | Admitting: Physician Assistant

## 2022-12-28 VITALS — BP 100/68 | HR 74 | Ht 65.0 in | Wt 116.0 lb

## 2022-12-28 DIAGNOSIS — D494 Neoplasm of unspecified behavior of bladder: Secondary | ICD-10-CM

## 2022-12-28 MED ORDER — SULFAMETHOXAZOLE-TRIMETHOPRIM 800-160 MG PO TABS
1.0000 | ORAL_TABLET | Freq: Once | ORAL | Status: DC
Start: 2022-12-28 — End: 2022-12-28

## 2022-12-28 MED ORDER — SULFAMETHOXAZOLE-TRIMETHOPRIM 800-160 MG PO TABS
1.0000 | ORAL_TABLET | Freq: Once | ORAL | Status: AC
Start: 2022-12-28 — End: 2022-12-28
  Administered 2022-12-28: 1 via ORAL

## 2022-12-28 NOTE — Progress Notes (Signed)
Catheter Removal  Patient is present today for a catheter removal.  21ml of water was drained from the balloon. A 20FR coude three way hematuria foley cath was removed from the bladder, no complications were noted. Patient tolerated well.  Performed by: Carman Ching, PA-C   Additional notes: 1 dose Bactrim DS administered prior to Foley removal for UTI prevention. We discussed her surgical pathology results with high grade NMIBC. We discussed tentative plan for intravesical BCG; will defer to Dr. Lonna Cobb to confirm plan given squamous component on pathology.  Follow up: Return for Dr. Lonna Cobb to call re: tx plan.

## 2022-12-29 ENCOUNTER — Encounter: Payer: Self-pay | Admitting: Oncology

## 2022-12-29 ENCOUNTER — Inpatient Hospital Stay (HOSPITAL_BASED_OUTPATIENT_CLINIC_OR_DEPARTMENT_OTHER): Payer: Medicare Other | Admitting: Oncology

## 2022-12-29 ENCOUNTER — Inpatient Hospital Stay: Payer: Medicare Other | Attending: Oncology

## 2022-12-29 ENCOUNTER — Telehealth: Payer: Self-pay

## 2022-12-29 ENCOUNTER — Other Ambulatory Visit: Payer: Self-pay

## 2022-12-29 ENCOUNTER — Inpatient Hospital Stay: Payer: Medicare Other

## 2022-12-29 VITALS — BP 178/74 | HR 87 | Temp 97.0°F | Resp 18

## 2022-12-29 VITALS — BP 157/77 | HR 85 | Temp 95.6°F | Resp 18 | Wt 113.0 lb

## 2022-12-29 DIAGNOSIS — F17210 Nicotine dependence, cigarettes, uncomplicated: Secondary | ICD-10-CM | POA: Diagnosis not present

## 2022-12-29 DIAGNOSIS — C3412 Malignant neoplasm of upper lobe, left bronchus or lung: Secondary | ICD-10-CM | POA: Diagnosis present

## 2022-12-29 DIAGNOSIS — Z79899 Other long term (current) drug therapy: Secondary | ICD-10-CM | POA: Diagnosis not present

## 2022-12-29 DIAGNOSIS — C679 Malignant neoplasm of bladder, unspecified: Secondary | ICD-10-CM | POA: Diagnosis not present

## 2022-12-29 DIAGNOSIS — Z720 Tobacco use: Secondary | ICD-10-CM

## 2022-12-29 DIAGNOSIS — C771 Secondary and unspecified malignant neoplasm of intrathoracic lymph nodes: Secondary | ICD-10-CM | POA: Insufficient documentation

## 2022-12-29 DIAGNOSIS — Z5112 Encounter for antineoplastic immunotherapy: Secondary | ICD-10-CM

## 2022-12-29 DIAGNOSIS — C3482 Malignant neoplasm of overlapping sites of left bronchus and lung: Secondary | ICD-10-CM

## 2022-12-29 DIAGNOSIS — D649 Anemia, unspecified: Secondary | ICD-10-CM | POA: Diagnosis not present

## 2022-12-29 DIAGNOSIS — C787 Secondary malignant neoplasm of liver and intrahepatic bile duct: Secondary | ICD-10-CM | POA: Diagnosis present

## 2022-12-29 DIAGNOSIS — Z91041 Radiographic dye allergy status: Secondary | ICD-10-CM

## 2022-12-29 LAB — CMP (CANCER CENTER ONLY)
ALT: 26 U/L (ref 0–44)
AST: 26 U/L (ref 15–41)
Albumin: 3.7 g/dL (ref 3.5–5.0)
Alkaline Phosphatase: 61 U/L (ref 38–126)
Anion gap: 9 (ref 5–15)
BUN: 12 mg/dL (ref 8–23)
CO2: 26 mmol/L (ref 22–32)
Calcium: 9 mg/dL (ref 8.9–10.3)
Chloride: 103 mmol/L (ref 98–111)
Creatinine: 0.97 mg/dL (ref 0.44–1.00)
GFR, Estimated: 58 mL/min — ABNORMAL LOW (ref >60)
Glucose, Bld: 131 mg/dL — ABNORMAL HIGH (ref 70–99)
Potassium: 3.6 mmol/L (ref 3.5–5.1)
Sodium: 138 mmol/L (ref 135–145)
Total Bilirubin: 0.5 mg/dL (ref 0.3–1.2)
Total Protein: 6.9 g/dL (ref 6.5–8.1)

## 2022-12-29 LAB — CBC WITH DIFFERENTIAL (CANCER CENTER ONLY)
Abs Immature Granulocytes: 0.04 10*3/uL (ref 0.00–0.07)
Basophils Absolute: 0 10*3/uL (ref 0.0–0.1)
Basophils Relative: 0 %
Eosinophils Absolute: 0.2 10*3/uL (ref 0.0–0.5)
Eosinophils Relative: 2 %
HCT: 36.6 % (ref 36.0–46.0)
Hemoglobin: 11.8 g/dL — ABNORMAL LOW (ref 12.0–15.0)
Immature Granulocytes: 0 %
Lymphocytes Relative: 14 %
Lymphs Abs: 1.3 10*3/uL (ref 0.7–4.0)
MCH: 28.8 pg (ref 26.0–34.0)
MCHC: 32.2 g/dL (ref 30.0–36.0)
MCV: 89.3 fL (ref 80.0–100.0)
Monocytes Absolute: 0.9 10*3/uL (ref 0.1–1.0)
Monocytes Relative: 9 %
Neutro Abs: 6.7 10*3/uL (ref 1.7–7.7)
Neutrophils Relative %: 75 %
Platelet Count: 295 10*3/uL (ref 150–400)
RBC: 4.1 MIL/uL (ref 3.87–5.11)
RDW: 16.9 % — ABNORMAL HIGH (ref 11.5–15.5)
WBC Count: 9.1 10*3/uL (ref 4.0–10.5)
nRBC: 0 % (ref 0.0–0.2)

## 2022-12-29 LAB — TSH: TSH: 1.059 u[IU]/mL (ref 0.350–4.500)

## 2022-12-29 MED ORDER — PREDNISONE 50 MG PO TABS: 50.0000 mg | ORAL_TABLET | ORAL | 0 refills | Status: DC

## 2022-12-29 MED ORDER — HEPARIN SOD (PORK) LOCK FLUSH 100 UNIT/ML IV SOLN
500.0000 [IU] | Freq: Once | INTRAVENOUS | Status: DC | PRN
  Filled 2022-12-29: qty 5

## 2022-12-29 MED ORDER — SODIUM CHLORIDE 0.9 % IV SOLN
200.0000 mg | Freq: Once | INTRAVENOUS | Status: AC
  Administered 2022-12-29: 200 mg via INTRAVENOUS
  Filled 2022-12-29: qty 8

## 2022-12-29 MED ORDER — SODIUM CHLORIDE 0.9 % IV SOLN
Freq: Once | INTRAVENOUS | Status: AC
  Filled 2022-12-29: qty 250

## 2022-12-29 NOTE — Assessment & Plan Note (Signed)
Encourage patient's smoking cessation efforts.

## 2022-12-29 NOTE — Patient Instructions (Signed)
Instructions for Dye/ contrast allergy  Prednisone - 50 mg by mouth at 13 hours, 7 hours, and 1 hour before contrast media injection (CT scan) Diphenhydramine (Benadryl) - 50 mg by mouth 1 hour before contrast medium

## 2022-12-29 NOTE — Assessment & Plan Note (Addendum)
Non-muscle invasive bladder cancer. S/p TURBT, T1 lesion, high grade.  Recommend patient to follow-up with urology, there is plan for intravesical BCG.

## 2022-12-29 NOTE — Patient Instructions (Addendum)
Instructions for Dye/ contrast allergy  Prednisone - 50 mg by mouth at 13 hours, 7 hours, and 1 hour before contrast media injection (CT scan) Diphenhydramine (Benadryl) - 50 mg by mouth 1 hour before contrast medium     [place] [facility]  Discharge Instructions: Thank you for choosing [facility] to provide your oncology and hematology care.  If you have a lab appointment with the Cancer Center, please go directly to the Cancer Center and check in at the registration area.  Wear comfortable clothing and clothing appropriate for easy access to any Portacath or PICC line.   We strive to give you quality time with your provider. You may need to reschedule your appointment if you arrive late (15 or more minutes).  Arriving late affects you and other patients whose appointments are after yours.  Also, if you miss three or more appointments without notifying the office, you may be dismissed from the clinic at the provider's discretion.      For prescription refill requests, have your pharmacy contact our office and allow 72 hours for refills to be completed.    Today you received the following chemotherapy and/or immunotherapy agents Keytruda       To help prevent nausea and vomiting after your treatment, we encourage you to take your nausea medication as directed.  BELOW ARE SYMPTOMS THAT SHOULD BE REPORTED IMMEDIATELY: *FEVER GREATER THAN 100.4 F (38 C) OR HIGHER *CHILLS OR SWEATING *NAUSEA AND VOMITING THAT IS NOT CONTROLLED WITH YOUR NAUSEA MEDICATION *UNUSUAL SHORTNESS OF BREATH *UNUSUAL BRUISING OR BLEEDING *URINARY PROBLEMS (pain or burning when urinating, or frequent urination) *BOWEL PROBLEMS (unusual diarrhea, constipation, pain near the anus) TENDERNESS IN MOUTH AND THROAT WITH OR WITHOUT PRESENCE OF ULCERS (sore throat, sores in mouth, or a toothache) UNUSUAL RASH, SWELLING OR PAIN  UNUSUAL VAGINAL DISCHARGE OR ITCHING   Items with  * indicate a potential emergency and should be followed up as soon as possible or go to the Emergency Department if any problems should occur.  Please show the CHEMOTHERAPY ALERT CARD or IMMUNOTHERAPY ALERT CARD at check-in to the Emergency Department and triage nurse.  Should you have questions after your visit or need to cancel or reschedule your appointment, please contact [facility] [facility]  [phone] and follow the prompts.  Office hours are 8:00 a.m. to 4:30 p.m. Monday - Friday. Please note that voicemails left after 4:00 p.m. may not be returned until the following business day.  We are closed weekends and major holidays. You have access to a nurse at all times for urgent questions. Please call the main number to the clinic [phone] and follow the prompts.  For any non-urgent questions, you may also contact your provider using MyChart. We now offer e-Visits for anyone 62 and older to request care online for non-urgent symptoms. For details visit [email or web address].   Also download the MyChart app! Go to the app store, search "MyChart", open the app, select [place], and log in with your MyChart username and password.

## 2022-12-29 NOTE — Assessment & Plan Note (Addendum)
Non-small cell lung cancer, favor squamous cell carcinoma, stage IV- TPS 90%, TMB 10, KRAS G12V MRI brain with and without contrast -  Left occipital focus of prior hemorrhage with mild linear contrast enhancement. Possible small metastasis or microhemmorhagic event.  TPS 90%,  CT scan showed mixed response.  - repeat CT prior to next visit  Labs are reviewed and discussed with patient.  Proceed with Keytruda  Brain lesion, repeat MRI in May 2024 shows no brain metastatic disease.

## 2022-12-29 NOTE — Assessment & Plan Note (Signed)
Immunotherapy treatment plan as listed above. 

## 2022-12-29 NOTE — Assessment & Plan Note (Signed)
 She has been off  Elqiuis 5mg  BID due to GI bleeding

## 2022-12-29 NOTE — Assessment & Plan Note (Signed)
 Chronic anemia.  Hemoglobin is stable

## 2022-12-29 NOTE — Assessment & Plan Note (Signed)
Recommend Prednisone - 50 mg by mouth at 13 hours, 7 hours, and 1 hour before contrast media injection Diphenhydramine (Benadryl) - 50 mg  by mouth 1 hour before contrast medium

## 2022-12-29 NOTE — Progress Notes (Signed)
Hematology/Oncology Progress note [phone] [phone]        CHIEF COMPLAINTS/PURPOSE OF CONSULTATION:  Stage IV Lung squamous cell carcinoma  ASSESSMENT & PLAN:   Cancer Staging  Malignant neoplasm of lung (HCC) Staging form: Lung, AJCC 8th Edition - Clinical: Stage IVA (cT4, cN3, cM1a) - Signed by Rickard Patience, MD on 06/28/2022  Urothelial carcinoma of bladder [facility]) Staging form: Urinary Bladder, AJCC 8th Edition - Clinical stage from 12/21/2022: Stage I (cT1, cN0, cM0) - Signed by Rickard Patience, MD on 12/29/2022   Malignant neoplasm of lung (HCC) Non-small cell lung cancer, favor squamous cell carcinoma, stage IV- TPS 90%, TMB 10, KRAS G12V MRI brain with and without contrast -  Left occipital focus of prior hemorrhage with mild linear contrast enhancement. Possible small metastasis or microhemmorhagic event.  TPS 90%,  CT scan showed mixed response.  - repeat CT prior to next visit  Labs are reviewed and discussed with patient.  Proceed with Keytruda  Brain lesion, repeat MRI in May 2024 shows no brain metastatic disease.   History of pulmonary embolism She has been off  Elqiuis 5mg  BID due to GI bleeding  Encounter for antineoplastic immunotherapy Immunotherapy treatment plan as listed above.  Normocytic anemia Chronic anemia.  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  Recommend patient to follow-up with urology, there is plan for intravesical BCG.  Contrast media allergy Recommend Prednisone - 50 mg by mouth at 13 hours, 7 hours, and 1 hour before contrast media injection Diphenhydramine (Benadryl) - 50 mg  by mouth 1 hour before contrast medium   Tobacco abuse Encourage patient's smoking cessation efforts.   Orders Placed This Encounter  Procedures   CT CHEST ABDOMEN PELVIS W CONTRAST    Standing Status:   Future    Standing Expiration Date:   12/29/2023     Order Specific Question:   If indicated for the ordered procedure, I authorize the administration of contrast media per Radiology protocol    Answer:   Yes    Order Specific Question:   Does the patient have a contrast media/X-ray dye allergy?    Answer:   Yes    Order Specific Question:   Preferred imaging location?    Answer:   [facility]    Order Specific Question:   If indicated for the ordered procedure, I authorize the administration of oral contrast media per Radiology protocol    Answer:   Yes   CBC with Differential (Cancer Center Only)    Standing Status:   Future    Standing Expiration Date:   02/09/2024   CMP (Cancer Center only)    Standing Status:   Future    Standing Expiration Date:   02/09/2024   Follow up in 3 weeks.   All questions were answered. The patient knows to call the clinic with any problems, questions or concerns.  Rickard Patience, MD, PhD [facility] Hematology Oncology 12/29/2022       HISTORY OF PRESENTING ILLNESS:  Regina Perkins 82 y.o. 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 I have reviewed her chart and materials related to her cancer extensively and collaborated history with the patient. Summary of oncologic history is as follows: Oncology History  Malignant neoplasm of lung (HCC)  05/24/2022 Imaging   CT chest angiogram showed 1. No evidence for  pulmonary embolism. 2. Left upper lobe/prevascular mass worrisome for neoplasm. 3. Mediastinal and hilar lymphadenopathy. 4. Multiple pulmonary nodules measuring up to 11 mm worrisome for metastatic disease. 5. Small left pleural effusion. 6. Patchy ground-glass and airspace opacities in the left upper lobe worrisome for infection. 7. Moderate-sized pericardial effusion. 8. Cholelithiasis. 9. Nonobstructing left renal calculus   05/25/2022 Imaging   CT abdomen pelvis wo contrast 1. No acute  findings or explanation for the patient's symptoms. 2. No evidence of primary malignancy or metastatic disease within the abdomen or pelvis on noncontrast imaging.  3. Cholelithiasis without evidence of cholecystitis or biliary dilatation. 4. Nonobstructing left renal calculus. 5. Moderate stool throughout the colon suggesting constipation. 6.  Aortic Atherosclerosis    05/26/2022 Initial Diagnosis   Malignant neoplasm of lung - TPS 90%, TMB 10, KRAS G12V  -05/22/2022 in the emergency room, she was found to have hypoxia with oxygen levels of 86% on room air, improved to 94 with 2 L of oxygen.  D-dimer was elevated at 1.29, troponin negative.  Lower extremity Doppler was negative for DVT.  VQ scan high probability PE (Large segmental perfusion defect of the left upper lobe without corresponding radiographic abnormality. Additional bilateral wedge-shaped subsegmental branch perfusion defects .  Patient was admitted and started on heparin Echocardiogram showed no RV strain.  Moderate pericardial effusion. Over her hospitalization, she continues to have shortness of breath with minimal exertion. 05/24/2022, CT chest angiogram showed left lung mass with bilateral lung nodules.   06/16/2022 Imaging   PET scan  1. 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 No evidence of metastatic disease in the abdomen or pelvis. 2. Moderate pericardial effusion. 3. Tiny left pleural effusion. 4. Left renal stone. 5. Aortic atherosclerosis (ICD10-I70.0). Coronary artery calcification. 6. Enlarged pulmonic trunk, indicative of pulmonary arterial hypertension    06/21/2022 Procedure   US guided liver right supraclvicular node biopsy showed Metastatic  poorly differentiated carcinoma, favor squamous cell carcinoma, probably pulmonary origin.    06/28/2022 Cancer Staging   Staging form: Lung, AJCC 8th Edition - Clinical: Stage IVA (cT4, cN3, cM1a) - Signed by Rickard Patience, MD on 06/28/2022   07/14/2022 -  Chemotherapy   Patient is on Treatment Plan : LUNG NSCLC Pembrolizumab (200) q21d     09/29/2022 Imaging   CT chest abdomen pelvis w contrast showed Bilateral pulmonary nodules, some of which are waxing/waning, but favoring multifocal lung carcinoma/metastases.   Improving thoracic nodal metastases, as above.  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 Consider cystoscopy as clinically warranted.  Aortic Atherosclerosis (ICD10-I70.0) and Emphysema (ICD10-J43.9)   Urothelial carcinoma of bladder (HCC)  10/06/2022 Initial Diagnosis   Urothelial carcinoma of bladder S/p TURBT  1. 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 Bladder, biopsy, Base of tumor HIGH GRADE PAPILLARY UROTHELIAL CARCINOMA SUBMUCOSA AND MUSCULARIS PROPRIA IS PRESENT AND NOT INVOLVED BY CARCINOMA  Postprocedure, patient developed gross hematuria and was admitted to the hospital.  She received PRBC transfusion.  Hemoglobin improved and she was discharged.    12/21/2022 Cancer Staging   Staging form: Urinary Bladder, AJCC 8th Edition - Clinical stage from 12/21/2022: Stage I (cT1, cN0, cM0) - Signed by  Rickard Patience, MD on 12/29/2022 WHO/ISUP grade (low/high): High Grade Histologic grading system: 2 grade system     INTERVAL HISTORY Regina Perkins is a 82 y.o. female who has above history reviewed by me today presents for follow up visit for Lung squamous cell carcinoma She tolerates [place] well. Denies skin rash, diarrhea.  chronic SOB with exertion, improved she has been using her  inhalers.  Status post TURBT for bladder mass.  Pathology showed nonmuscle invasive papillary carcinoma. She developed gross hematuria, and anemia.  Was admitted.  Hemoglobin improved after blood transfusion.  And she was discharged.  She follow-up with urology and there is plan for intravesical BCG. She reports feeling well.  No new complaints.   MEDICAL HISTORY:  Past Medical History:  Diagnosis Date   Atherosclerosis of native arteries of extremity with intermittent claudication (HCC)    Bladder mass    Chronic kidney disease, stage 3a (HCC)    Diabetes mellitus without complication (HCC)    Gastrointestinal hemorrhage    Glaucoma    HTN (hypertension)    Hypertension    Hypokalemia    Hypomagnesemia    Leukocytosis    Malignant neoplasm of lung (HCC)    Malnutrition (HCC)    Normocytic anemia    Pericardial effusion    Pulmonary embolism (HCC)    Sepsis (HCC)    Type II diabetes mellitus with renal manifestations (HCC)     SURGICAL HISTORY: Past Surgical History:  Procedure Laterality Date   ABDOMINAL HYSTERECTOMY     BACK SURGERY     1988 and 1998   CYSTOSCOPY W/ RETROGRADES Bilateral 12/21/2022   Procedure: CYSTOSCOPY WITH RETROGRADE PYELOGRAM;  Surgeon: [name], MD;  Location: [facility] ORS;  Service: Urology;  Laterality: Bilateral;   ESOPHAGOGASTRODUODENOSCOPY (EGD) WITH PROPOFOL N/A 07/29/2022   Procedure: ESOPHAGOGASTRODUODENOSCOPY (EGD) WITH PROPOFOL;  Surgeon: Wyline Mood, MD;  Location: [facility] END[facility]COPY;  Service: Gastroenterology;  Laterality: N/A;   TRANSURETHRAL RESECTION OF BLADDER TUMOR N/A 12/21/2022   Procedure: TRANSURETHRAL RESECTION OF BLADDER TUMOR (TURBT);  Surgeon: [name], MD;  Location: [facility] ORS;  Service: Urology;  Laterality: N/A;    SOCIAL HISTORY: Social History   Socioeconomic History   Marital status: Widowed    Spouse name: Not on file   Number of children: Not on file   Years of education: Not on file   Highest education  level: Not on file  Occupational History   Not on file  Tobacco Use   Smoking status: Every Day    Current packs/day: 0.25    Types: Cigarettes   Smokeless tobacco: Never  Vaping Use   Vaping status: Never Used  Substance and Sexual Activity   Alcohol use: No   Drug use: No   Sexual activity: Not on file  Other Topics Concern   Not on file  Social History Narrative   Not on file   Social Determinants of Health   Financial Resource Strain: Low Risk  (06/28/2022)   Overall Financial Resource Strain (CARDIA)    Difficulty of Paying Living Expenses: Not very hard  Food Insecurity: No Food Insecurity (12/22/2022)   Hunger Vital Sign    Worried About Running Out of Food in the Last Year: Never true    Ran Out of Food in the Last Year: Never true  Transportation Needs: No Transportation Needs (12/22/2022)   PRAPARE - Administrator, Civil Service (Medical): No    Lack of Transportation (Non-Medical): No  Physical Activity: Not on file  Stress: No Stress Concern Present (06/28/2022)   [employer] of Occupational Health - Occupational Stress Questionnaire    Feeling of Stress : Not at all  Social Connections: Not on file  Intimate Partner Violence: Not At Risk (12/22/2022)   Humiliation, Afraid, Rape, and Kick questionnaire    Fear of Current or Ex-Partner: No    Emotionally Abused: No    Physically Abused: No    Sexually Abused: No    FAMILY HISTORY: Family History  Problem Relation Age of Onset   Asthma Mother    Heart disease Mother    Hypertension Mother    Diabetes Mother    Stroke Mother    Diabetes Sister    Diabetes Brother     ALLERGIES:  is allergic to ivp dye [iodinated contrast media] and shellfish allergy.  MEDICATIONS:  Current Outpatient Medications  Medication Sig Dispense Refill   amLODipine (NORVASC) 10 MG tablet Take 10 mg by mouth daily.     BREZTRI AEROSPHERE 160-9-4.8 MCG/ACT AERO Inhale 1 puff into the lungs daily.      cyanocobalamin (VITAMIN B12) 1000 MCG tablet Take 1 tablet (1,000 mcg total) by mouth daily. 90 tablet 0   Fluticasone-Umeclidin-Vilant (TRELEGY ELLIPTA) 100-62.5-25 MCG/ACT AEPB Inhale into the lungs.     lisinopril (ZESTRIL) 40 MG tablet Take 40 mg by mouth daily.     metFORMIN (GLUCOPHAGE) 1000 MG tablet Take 1,000 mg by mouth 2 (two) times daily with a meal.     montelukast (SINGULAIR) 10 MG tablet Take 10 mg by mouth at bedtime.     omeprazole (PRILOSEC) 40 MG capsule Take 1 capsule (40 mg total) by mouth in the morning and at bedtime. 60 capsule 2   predniSONE (DELTASONE) 50 MG tablet Take 1 tablet (50 mg total) by mouth See admin instructions. Prednisone - take 50 mg by mouth at 13 hours, 7 hours, and 1 hour before contrast media injection 3 tablet 0   trospium (SANCTURA) 20 MG tablet Take 1 tablet (20 mg total) by mouth 2 (two) times daily as needed (frequency,urgency,bladder spasm). 20 tablet 0   No current facility-administered medications for this visit.    Review of Systems  Constitutional:  Negative for appetite change, chills, fatigue and fever.  HENT:   Negative for hearing loss and voice change.   Eyes:  Negative for eye problems.  Respiratory:  Positive for shortness of breath. Negative for chest tightness and cough.   Cardiovascular:  Negative for chest pain.  Gastrointestinal:  Negative for abdominal distention, abdominal pain and blood in stool.  Endocrine: Negative for hot flashes.  Genitourinary:  Negative for difficulty urinating and frequency.   Musculoskeletal:  Negative for arthralgias.  Skin:  Negative for itching and rash.  Neurological:  Negative for extremity weakness.  Hematological:  Negative for adenopathy.  Psychiatric/Behavioral:  Negative for confusion.      PHYSICAL EXAMINATION: ECOG PERFORMANCE STATUS: 2 - Symptomatic, <50% confined to bed  Vitals:   12/29/22 0856  BP: (!) 157/77  Pulse: 85  Resp: 18  Temp: (!) 95.6 F (35.3 C)  SpO2: 100%    Filed Weights   12/29/22 0856  Weight: 113 lb (51.3 kg)    Physical Exam Constitutional:      General: She is not in acute distress.    Appearance: She is not diaphoretic.  HENT:     Head: Normocephalic.  Eyes:     General: No scleral icterus.  Pupils: Pupils are equal, round, and reactive to light.  Cardiovascular:     Heart sounds: No murmur heard. Pulmonary:     Effort: Pulmonary effort is normal. No respiratory distress.     Comments: Decreased breath sounds bilaterally Abdominal:     General: There is no distension.  Musculoskeletal:        General: Normal range of motion.     Cervical back: Normal range of motion.  Skin:    Findings: No erythema.  Neurological:     Mental Status: She is alert and oriented to person, place, and time. Mental status is at baseline.     Cranial Nerves: No cranial nerve deficit.     Motor: No abnormal muscle tone.  Psychiatric:        Mood and Affect: Affect normal.      LABORATORY DATA:  I have reviewed the data as listed    Latest Ref Rng & Units 12/29/2022    8:40 AM 12/25/2022    9:42 AM 12/25/2022    4:12 AM  CBC  WBC 4.0 - 10.5 K/uL 9.1     Hemoglobin 12.0 - 15.0 g/dL 16.1  09.6  04.5   Hematocrit 36.0 - 46.0 % 36.6  35.5  35.6   Platelets 150 - 400 K/uL 295         Latest Ref Rng & Units 12/29/2022    8:40 AM 12/25/2022    9:42 AM 12/24/2022    3:13 AM  CMP  Glucose 70 - 99 mg/dL 409  811  98   BUN 8 - 23 mg/dL 12  10  12    Creatinine 0.44 - 1.00 mg/dL 9.14  7.82  9.56   Sodium 135 - 145 mmol/L 138  135  139   Potassium 3.5 - 5.1 mmol/L 3.6  3.0  3.4   Chloride 98 - 111 mmol/L 103  102  104   CO2 22 - 32 mmol/L 26  24  26    Calcium 8.9 - 10.3 mg/dL 9.0  8.4  9.0   Total Protein 6.5 - 8.1 g/dL 6.9     Total Bilirubin 0.3 - 1.2 mg/dL 0.5     Alkaline Phos 38 - 126 U/L 61     AST 15 - 41 U/L 26     ALT 0 - 44 U/L 26        RADIOGRAPHIC STUDIES: I have personally reviewed the radiological images as listed and  agreed with the findings in the report. DG OR UROLOGY CYSTO IMAGE ([facility] ONLY)  Result Date: 12/21/2022 There is no interpretation for this exam.  This order is for images obtained during a surgical procedure.  Please See "Surgeries" Tab for more information regarding the procedure.

## 2022-12-29 NOTE — Telephone Encounter (Signed)
Request for port placement faxed to IR.

## 2022-12-30 ENCOUNTER — Encounter: Payer: Self-pay | Admitting: Oncology

## 2022-12-30 LAB — T4: T4, Total: 9.9 ug/dL (ref 4.5–12.0)

## 2022-12-30 NOTE — Telephone Encounter (Signed)
Port insertion scheduled for Mon 9/16 @1 :30p arrive 12:30p [facility]. NPO for 8 hours prior and she will need a driver. Pt informed of this.

## 2023-01-06 ENCOUNTER — Ambulatory Visit
Admission: RE | Admit: 2023-01-06 | Discharge: 2023-01-06 | Disposition: A | Discharge status: Home / Self Care | Payer: Medicare Other | Source: Ambulatory Visit | Attending: Oncology | Admitting: Oncology

## 2023-01-06 DIAGNOSIS — C3482 Malignant neoplasm of overlapping sites of left bronchus and lung: Secondary | ICD-10-CM | POA: Diagnosis present

## 2023-01-06 MED ORDER — IOHEXOL 300 MG/ML  SOLN
100.0000 mL | Freq: Once | INTRAMUSCULAR | Status: AC | PRN
  Administered 2023-01-06: 100 mL via INTRAVENOUS

## 2023-01-07 NOTE — Progress Notes (Signed)
Patient for IR Port Insertion on Monday 01/10/2023, I called and spoke with the patient on the phone and gave pre-procedure instructions. Pt was made aware to be here at 12:30p, NPO after MN prior to procedure as well as driver post procedure/recovery/discharge. Pt stated understanding.  Called 01/07/2023

## 2023-01-10 ENCOUNTER — Other Ambulatory Visit: Payer: Self-pay | Admitting: Student

## 2023-01-10 ENCOUNTER — Ambulatory Visit
Admission: RE | Admit: 2023-01-10 | Discharge: 2023-01-10 | Disposition: A | Discharge status: Home / Self Care | Payer: Medicare Other | Source: Ambulatory Visit | Attending: Oncology | Admitting: Radiology

## 2023-01-10 DIAGNOSIS — Z7984 Long term (current) use of oral hypoglycemic drugs: Secondary | ICD-10-CM | POA: Insufficient documentation

## 2023-01-10 DIAGNOSIS — N1831 Chronic kidney disease, stage 3a: Secondary | ICD-10-CM | POA: Insufficient documentation

## 2023-01-10 DIAGNOSIS — F17210 Nicotine dependence, cigarettes, uncomplicated: Secondary | ICD-10-CM | POA: Diagnosis not present

## 2023-01-10 DIAGNOSIS — C679 Malignant neoplasm of bladder, unspecified: Secondary | ICD-10-CM | POA: Diagnosis not present

## 2023-01-10 DIAGNOSIS — Z01812 Encounter for preprocedural laboratory examination: Secondary | ICD-10-CM

## 2023-01-10 DIAGNOSIS — Z833 Family history of diabetes mellitus: Secondary | ICD-10-CM | POA: Insufficient documentation

## 2023-01-10 DIAGNOSIS — Z8249 Family history of ischemic heart disease and other diseases of the circulatory system: Secondary | ICD-10-CM | POA: Insufficient documentation

## 2023-01-10 DIAGNOSIS — E1122 Type 2 diabetes mellitus with diabetic chronic kidney disease: Secondary | ICD-10-CM | POA: Insufficient documentation

## 2023-01-10 DIAGNOSIS — C3490 Malignant neoplasm of unspecified part of unspecified bronchus or lung: Secondary | ICD-10-CM | POA: Insufficient documentation

## 2023-01-10 DIAGNOSIS — I129 Hypertensive chronic kidney disease with stage 1 through stage 4 chronic kidney disease, or unspecified chronic kidney disease: Secondary | ICD-10-CM | POA: Diagnosis not present

## 2023-01-10 DIAGNOSIS — Z5112 Encounter for antineoplastic immunotherapy: Secondary | ICD-10-CM

## 2023-01-10 HISTORY — PX: IR IMAGING GUIDED PORT INSERTION: IMG5740

## 2023-01-10 LAB — GLUCOSE, CAPILLARY: Glucose-Capillary: 116 mg/dL — ABNORMAL HIGH (ref 70–99)

## 2023-01-10 MED ORDER — MIDAZOLAM HCL 5 MG/5ML IJ SOLN
INTRAMUSCULAR | Status: AC | PRN
Start: 2023-01-10 — End: 2023-01-10
  Administered 2023-01-10: 1 mg via INTRAVENOUS
  Administered 2023-01-10: .5 mg via INTRAVENOUS

## 2023-01-10 MED ORDER — HEPARIN SOD (PORK) LOCK FLUSH 100 UNIT/ML IV SOLN
500.0000 [IU] | Freq: Once | INTRAVENOUS | Status: AC
  Administered 2023-01-10: 500 [IU] via INTRAVENOUS

## 2023-01-10 MED ORDER — HEPARIN SOD (PORK) LOCK FLUSH 100 UNIT/ML IV SOLN
INTRAVENOUS | Status: AC
  Filled 2023-01-10: qty 5

## 2023-01-10 MED ORDER — FENTANYL CITRATE (PF) 100 MCG/2ML IJ SOLN
INTRAMUSCULAR | Status: AC
  Filled 2023-01-10: qty 2

## 2023-01-10 MED ORDER — LIDOCAINE-EPINEPHRINE 1 %-1:100000 IJ SOLN
8.0000 mL | Freq: Once | INTRAMUSCULAR | Status: AC
  Administered 2023-01-10: 8 mL via INTRADERMAL

## 2023-01-10 MED ORDER — SODIUM CHLORIDE 0.9 % IV SOLN: INTRAVENOUS | Status: DC

## 2023-01-10 MED ORDER — MIDAZOLAM HCL 2 MG/2ML IJ SOLN
INTRAMUSCULAR | Status: AC
  Filled 2023-01-10: qty 2

## 2023-01-10 MED ORDER — LIDOCAINE-EPINEPHRINE 1 %-1:100000 IJ SOLN
INTRAMUSCULAR | Status: AC
  Filled 2023-01-10: qty 1

## 2023-01-10 MED ORDER — FENTANYL CITRATE (PF) 100 MCG/2ML IJ SOLN
INTRAMUSCULAR | Status: AC | PRN
Start: 2023-01-10 — End: 2023-01-10
  Administered 2023-01-10: 25 ug via INTRAVENOUS
  Administered 2023-01-10: 50 ug via INTRAVENOUS

## 2023-01-10 NOTE — H&P (Signed)
Chief Complaint: Patient was seen in consultation today for port placement  Referring Physician(s): Yu,Zhou  Supervising Physician: [name]  Patient Status: [facility] - Out-pt  History of Present Illness: Regina Perkins is a 82 y.o. female with PMH significant for CKD stage III, diabetes mellitus, hypertension, leukocytosis, and normocytic anemia being seen today for port placement to facilitate treatment of patient's stage IV lung cancer and stage I bladder cancer. Patient is under the care of Dr Cathie Hoops from Oncology service who has referred patient to IR for image-guided port placement.  Past Medical History:  Diagnosis Date   Atherosclerosis of native arteries of extremity with intermittent claudication (HCC)    Bladder mass    Chronic kidney disease, stage 3a (HCC)    Diabetes mellitus without complication (HCC)    Gastrointestinal hemorrhage    Glaucoma    HTN (hypertension)    Hypertension    Hypokalemia    Hypomagnesemia    Leukocytosis    Malignant neoplasm of lung (HCC)    Malnutrition (HCC)    Normocytic anemia    Pericardial effusion    Pulmonary embolism (HCC)    Sepsis (HCC)    Type II diabetes mellitus with renal manifestations (HCC)     Past Surgical History:  Procedure Laterality Date   ABDOMINAL HYSTERECTOMY     BACK SURGERY     1988 and 1998   CYSTOSCOPY W/ RETROGRADES Bilateral 12/21/2022   Procedure: CYSTOSCOPY WITH RETROGRADE PYELOGRAM;  Surgeon: [name], MD;  Location: [facility] ORS;  Service: Urology;  Laterality: Bilateral;   ESOPHAGOGASTRODUODENOSCOPY (EGD) WITH PROPOFOL N/A 07/29/2022   Procedure: ESOPHAGOGASTRODUODENOSCOPY (EGD) WITH PROPOFOL;  Surgeon: Wyline Mood, MD;  Location: [facility] ENDOSCOPY;  Service: Gastroenterology;  Laterality: N/A;   TRANSURETHRAL RESECTION OF BLADDER TUMOR N/A 12/21/2022   Procedure: TRANSURETHRAL RESECTION OF BLADDER TUMOR (TURBT);  Surgeon: [name], MD;  Location: [facility] ORS;  Service: Urology;   Laterality: N/A;    Allergies: Ivp dye [iodinated contrast media] and Shellfish allergy  Medications: Prior to Admission medications   Medication Sig Start Date End Date Taking? Authorizing Provider  amLODipine (NORVASC) 10 MG tablet Take 10 mg by mouth daily.   Yes [provider]  BREZTRI AEROSPHERE 160-9-4.8 MCG/ACT AERO Inhale 1 puff into the lungs daily. 06/03/22 06/03/23 Yes [provider]  cyanocobalamin (VITAMIN B12) 1000 MCG tablet Take 1 tablet (1,000 mcg total) by mouth daily. 08/25/22  Yes Rickard Patience, MD  Fluticasone-Umeclidin-Vilant (TRELEGY ELLIPTA) 100-62.5-25 MCG/ACT AEPB Inhale into the lungs. 06/03/22  Yes [provider]  lisinopril (ZESTRIL) 40 MG tablet Take 40 mg by mouth daily.   Yes [provider]  metFORMIN (GLUCOPHAGE) 1000 MG tablet Take 1,000 mg by mouth 2 (two) times daily with a meal.   Yes [provider]  montelukast (SINGULAIR) 10 MG tablet Take 10 mg by mouth at bedtime.   Yes [provider]  omeprazole (PRILOSEC) 40 MG capsule Take 1 capsule (40 mg total) by mouth in the morning and at bedtime. 07/29/22  Yes Arnetha Courser, MD  predniSONE (DELTASONE) 50 MG tablet Take 1 tablet (50 mg total) by mouth See admin instructions. Prednisone - take 50 mg by mouth at 13 hours, 7 hours, and 1 hour before contrast media injection 12/29/22  Yes Rickard Patience, MD  trospium (SANCTURA) 20 MG tablet Take 1 tablet (20 mg total) by mouth 2 (two) times daily as needed (frequency,urgency,bladder spasm). 12/21/22  Yes Stoioff, Verna Czech, MD  Family History  Problem Relation Age of Onset   Asthma Mother    Heart disease Mother    Hypertension Mother    Diabetes Mother    Stroke Mother    Diabetes Sister    Diabetes Brother     Social History   Socioeconomic History   Marital status: Widowed    Spouse name: Not on file   Number of children: Not on file   Years of education: Not on file   Highest education level: Not on file   Occupational History   Not on file  Tobacco Use   Smoking status: Every Day    Current packs/day: 0.25    Types: Cigarettes   Smokeless tobacco: Never  Vaping Use   Vaping status: Never Used  Substance and Sexual Activity   Alcohol use: No   Drug use: No   Sexual activity: Not on file  Other Topics Concern   Not on file  Social History Narrative   Not on file   Social Determinants of Health   Financial Resource Strain: Low Risk  (06/28/2022)   Overall Financial Resource Strain (CARDIA)    Difficulty of Paying Living Expenses: Not very hard  Food Insecurity: No Food Insecurity (12/22/2022)   Hunger Vital Sign    Worried About Running Out of Food in the Last Year: Never true    Ran Out of Food in the Last Year: Never true  Transportation Needs: No Transportation Needs (12/22/2022)   PRAPARE - Administrator, Civil Service (Medical): No    Lack of Transportation (Non-Medical): No  Physical Activity: Not on file  Stress: No Stress Concern Present (06/28/2022)   [employer] of Occupational Health - Occupational Stress Questionnaire    Feeling of Stress : Not at all  Social Connections: Not on file    Code Status: Full code  Review of Systems: A 12 point ROS discussed and pertinent positives are indicated in the HPI above.  All other systems are negative.  Review of Systems  Constitutional:  Negative for chills and fever.  Respiratory:  Positive for shortness of breath. Negative for chest tightness.   Cardiovascular:  Negative for chest pain and leg swelling.  Gastrointestinal:  Negative for abdominal pain, diarrhea, nausea and vomiting.  Neurological:  Negative for dizziness and headaches.  Psychiatric/Behavioral:  Negative for confusion.     Vital Signs: BP (!) 178/98   Pulse 94   Temp 98.4 F (36.9 C) (Oral)   Resp 16   Ht 5\' 4"  (1.626 m)   Wt 114 lb (51.7 kg)   SpO2 97%   BMI 19.57 kg/m    Physical Exam Vitals reviewed.  Constitutional:       General: She is not in acute distress.    Appearance: She is ill-appearing.  Cardiovascular:     Rate and Rhythm: Normal rate and regular rhythm.     Pulses: Normal pulses.     Heart sounds: Normal heart sounds.  Pulmonary:     Effort: Pulmonary effort is normal.     Breath sounds: Normal breath sounds.  Abdominal:     Palpations: Abdomen is soft.     Tenderness: There is no abdominal tenderness.  Musculoskeletal:     Right lower leg: No edema.     Left lower leg: No edema.  Skin:    General: Skin is warm and dry.  Neurological:     Mental Status: She is alert and oriented to person, place, and  time.  Psychiatric:        Mood and Affect: Mood normal.        Behavior: Behavior normal.        Thought Content: Thought content normal.        Judgment: Judgment normal.     Imaging: DG OR UROLOGY CYSTO IMAGE ([facility] ONLY)  Result Date: 12/21/2022 There is no interpretation for this exam.  This order is for images obtained during a surgical procedure.  Please See "Surgeries" Tab for more information regarding the procedure.    Labs:  CBC: Recent Labs    12/21/22 2136 12/22/22 0340 12/22/22 0841 12/24/22 0313 12/24/22 1051 12/24/22 2126 12/25/22 0412 12/25/22 0942 12/29/22 0840  WBC 8.5 15.7*  --  10.3  --   --   --   --  9.1  HGB 11.1* 9.3*   < > 7.7*   < > 12.7 12.2 12.2 11.8*  HCT 35.1* 29.3*   < > 24.1*   < > 36.9 35.6* 35.5* 36.6  PLT 306 240  --  173  --   --   --   --  295   < > = values in this interval not displayed.    COAGS: Recent Labs    05/22/22 1038  INR 1.1  APTT 36    BMP: Recent Labs    12/22/22 0340 12/24/22 0313 12/25/22 0942 12/29/22 0840  NA 134* 139 135 138  K 3.8 3.4* 3.0* 3.6  CL 103 104 102 103  CO2 20* 26 24 26   GLUCOSE 283* 98 150* 131*  BUN 26* 12 10 12   CALCIUM 8.3* 9.0 8.4* 9.0  CREATININE 1.32* 0.88 0.81 0.97  GFRNONAA 41* >60 >60 58*    LIVER FUNCTION TESTS: Recent Labs    11/17/22 1312 12/08/22 0847  12/21/22 2136 12/29/22 0840  BILITOT 0.2* 0.5 0.4 0.5  AST 26 32 31 26  ALT 21 29 22 26   ALKPHOS 58 58 57 61  PROT 6.8 6.8 6.9 6.9  ALBUMIN 3.7 3.8 3.7 3.7    TUMOR MARKERS: No results for input(s): "AFPTM", "CEA", "CA199", "CHROMGRNA" in the last 8760 hours.  Assessment and Plan:  Regina Perkins is an 82 yo female being seen today for port placement secondary to stage IV lung cancer and stage I bladder cancer. Patient has been NPO and is not taking any bloodthinners. Case reviewed with Dr Juliette Alcide and scheduled to proceed on 01/10/23.  Risks and benefits of image guided port-a-catheter placement was discussed with the patient including, but not limited to bleeding, infection, pneumothorax, or fibrin sheath development and need for additional procedures.  All of the patient's questions were answered, patient is agreeable to proceed. Consent signed and in chart.   Thank you for this interesting consult.  I greatly enjoyed meeting Lyvia L Berg and look forward to participating in their care.  A copy of this report was sent to the requesting provider on this date.  Electronically Signed: Kennieth Francois, PA-C 01/10/2023, 1:40 PM   I spent a total of 15 Minutes   in face to face in clinical consultation, greater than 50% of which was counseling/coordinating care for port placement.

## 2023-01-10 NOTE — Procedures (Signed)
Interventional Radiology Procedure Note  Date of Procedure: 01/10/2023  Procedure: Port placement   Findings:  1. IR chest port placement right chest    Complications: No immediate complications noted.   Estimated Blood Loss: minimal  Follow-up and Recommendations: 1. Ready for use    Olive Bass, MD  Vascular & Interventional Radiology  01/10/2023 3:01 PM

## 2023-01-19 ENCOUNTER — Encounter: Payer: Self-pay | Admitting: Oncology

## 2023-01-19 ENCOUNTER — Inpatient Hospital Stay: Payer: Medicare Other

## 2023-01-19 ENCOUNTER — Other Ambulatory Visit: Payer: Self-pay

## 2023-01-19 ENCOUNTER — Inpatient Hospital Stay (HOSPITAL_BASED_OUTPATIENT_CLINIC_OR_DEPARTMENT_OTHER): Payer: Medicare Other | Admitting: Oncology

## 2023-01-19 VITALS — BP 139/68 | HR 91 | Temp 99.0°F | Resp 18

## 2023-01-19 VITALS — BP 144/68 | HR 90 | Temp 97.9°F | Resp 18 | Wt 114.9 lb

## 2023-01-19 DIAGNOSIS — C3482 Malignant neoplasm of overlapping sites of left bronchus and lung: Secondary | ICD-10-CM

## 2023-01-19 DIAGNOSIS — Z86711 Personal history of pulmonary embolism: Secondary | ICD-10-CM

## 2023-01-19 DIAGNOSIS — Z5112 Encounter for antineoplastic immunotherapy: Secondary | ICD-10-CM

## 2023-01-19 DIAGNOSIS — D649 Anemia, unspecified: Secondary | ICD-10-CM

## 2023-01-19 DIAGNOSIS — C679 Malignant neoplasm of bladder, unspecified: Secondary | ICD-10-CM

## 2023-01-19 LAB — CBC WITH DIFFERENTIAL (CANCER CENTER ONLY)
Abs Immature Granulocytes: 0.03 10*3/uL (ref 0.00–0.07)
Basophils Absolute: 0 10*3/uL (ref 0.0–0.1)
Basophils Relative: 0 %
Eosinophils Absolute: 0.2 10*3/uL (ref 0.0–0.5)
Eosinophils Relative: 2 %
HCT: 37.5 % (ref 36.0–46.0)
Hemoglobin: 11.8 g/dL — ABNORMAL LOW (ref 12.0–15.0)
Immature Granulocytes: 0 %
Lymphocytes Relative: 13 %
Lymphs Abs: 1.2 10*3/uL (ref 0.7–4.0)
MCH: 28.2 pg (ref 26.0–34.0)
MCHC: 31.5 g/dL (ref 30.0–36.0)
MCV: 89.5 fL (ref 80.0–100.0)
Monocytes Absolute: 0.6 10*3/uL (ref 0.1–1.0)
Monocytes Relative: 7 %
Neutro Abs: 7.1 10*3/uL (ref 1.7–7.7)
Neutrophils Relative %: 78 %
Platelet Count: 272 10*3/uL (ref 150–400)
RBC: 4.19 MIL/uL (ref 3.87–5.11)
RDW: 17.3 % — ABNORMAL HIGH (ref 11.5–15.5)
WBC Count: 9.1 10*3/uL (ref 4.0–10.5)
nRBC: 0 % (ref 0.0–0.2)

## 2023-01-19 LAB — CMP (CANCER CENTER ONLY)
ALT: 27 U/L (ref 0–44)
AST: 33 U/L (ref 15–41)
Albumin: 3.8 g/dL (ref 3.5–5.0)
Alkaline Phosphatase: 76 U/L (ref 38–126)
Anion gap: 8 (ref 5–15)
BUN: 17 mg/dL (ref 8–23)
CO2: 25 mmol/L (ref 22–32)
Calcium: 9 mg/dL (ref 8.9–10.3)
Chloride: 104 mmol/L (ref 98–111)
Creatinine: 0.94 mg/dL (ref 0.44–1.00)
GFR, Estimated: >60 mL/min (ref >60)
Glucose, Bld: 148 mg/dL — ABNORMAL HIGH (ref 70–99)
Potassium: 3.5 mmol/L (ref 3.5–5.1)
Sodium: 137 mmol/L (ref 135–145)
Total Bilirubin: 0.4 mg/dL (ref 0.3–1.2)
Total Protein: 7.1 g/dL (ref 6.5–8.1)

## 2023-01-19 MED ORDER — LIDOCAINE-PRILOCAINE 2.5-2.5 % EX CREA: 1.0000 | TOPICAL_CREAM | CUTANEOUS | 1 refills | Status: DC | PRN

## 2023-01-19 MED ORDER — SODIUM CHLORIDE 0.9 % IV SOLN
200.0000 mg | Freq: Once | INTRAVENOUS | Status: AC
  Administered 2023-01-19: 200 mg via INTRAVENOUS
  Filled 2023-01-19: qty 8

## 2023-01-19 MED ORDER — SODIUM CHLORIDE 0.9 % IV SOLN
Freq: Once | INTRAVENOUS | Status: AC
  Filled 2023-01-19: qty 250

## 2023-01-19 MED ORDER — HEPARIN SOD (PORK) LOCK FLUSH 100 UNIT/ML IV SOLN
500.0000 [IU] | Freq: Once | INTRAVENOUS | Status: AC | PRN
  Administered 2023-01-19: 500 [IU]
  Filled 2023-01-19: qty 5

## 2023-01-19 NOTE — Patient Instructions (Signed)
[facility]  Discharge Instructions: Thank you for choosing [facility] to provide your oncology and hematology care.  If you have a lab appointment with the Cancer Center, please go directly to the Cancer Center and check in at the registration area.  Wear comfortable clothing and clothing appropriate for easy access to any Portacath or PICC line.   We strive to give you quality time with your provider. You may need to reschedule your appointment if you arrive late (15 or more minutes).  Arriving late affects you and other patients whose appointments are after yours.  Also, if you miss three or more appointments without notifying the office, you may be dismissed from the clinic at the provider's discretion.      For prescription refill requests, have your pharmacy contact our office and allow 72 hours for refills to be completed.    Today you received the following chemotherapy and/or immunotherapy agents Keytruda       To help prevent nausea and vomiting after your treatment, we encourage you to take your nausea medication as directed.  BELOW ARE SYMPTOMS THAT SHOULD BE REPORTED IMMEDIATELY: *FEVER GREATER THAN 100.4 F (38 C) OR HIGHER *CHILLS OR SWEATING *NAUSEA AND VOMITING THAT IS NOT CONTROLLED WITH YOUR NAUSEA MEDICATION *UNUSUAL SHORTNESS OF BREATH *UNUSUAL BRUISING OR BLEEDING *URINARY PROBLEMS (pain or burning when urinating, or frequent urination) *BOWEL PROBLEMS (unusual diarrhea, constipation, pain near the anus) TENDERNESS IN MOUTH AND THROAT WITH OR WITHOUT PRESENCE OF ULCERS (sore throat, sores in mouth, or a toothache) UNUSUAL RASH, SWELLING OR PAIN  UNUSUAL VAGINAL DISCHARGE OR ITCHING   Items with * indicate a potential emergency and should be followed up as soon as possible or go to the Emergency Department if any problems should occur.  Please show the CHEMOTHERAPY ALERT CARD or IMMUNOTHERAPY ALERT CARD at check-in to  the Emergency Department and triage nurse.  Should you have questions after your visit or need to cancel or reschedule your appointment, please contact [facility]  [phone] and follow the prompts.  Office hours are 8:00 a.m. to 4:30 p.m. Monday - Friday. Please note that voicemails left after 4:00 p.m. may not be returned until the following business day.  We are closed weekends and major holidays. You have access to a nurse at all times for urgent questions. Please call the main number to the clinic [phone] and follow the prompts.  For any non-urgent questions, you may also contact your provider using MyChart. We now offer e-Visits for anyone 68 and older to request care online for non-urgent symptoms. For details visit [email or web address].   Also download the MyChart app! Go to the app store, search "MyChart", open the app, select [place], and log in with your MyChart username and password.

## 2023-01-19 NOTE — Assessment & Plan Note (Signed)
Immunotherapy treatment plan as listed above.

## 2023-01-19 NOTE — Progress Notes (Signed)
Hematology/Oncology Progress note [phone] [phone]        CHIEF COMPLAINTS/PURPOSE OF CONSULTATION:  Stage IV Lung squamous cell carcinoma  ASSESSMENT & PLAN:   Cancer Staging  Malignant neoplasm of lung (HCC) Staging form: Lung, AJCC 8th Edition - Clinical: Stage IVA (cT4, cN3, cM1a) - Signed by Rickard Patience, MD on 06/28/2022  Urothelial carcinoma of bladder [facility]) Staging form: Urinary Bladder, AJCC 8th Edition - Clinical stage from 12/21/2022: Stage I (cT1, cN0, cM0) - Signed by Rickard Patience, MD on 12/29/2022   Malignant neoplasm of lung (HCC) Non-small cell lung cancer, favor squamous cell carcinoma, stage IV- TPS 90%, TMB 10, KRAS G12V MRI brain with and without contrast -  Left occipital focus of prior hemorrhage with mild linear contrast enhancement. Possible small metastasis or microhemmorhagic event.  TPS 90%, CT scan showed partial response. .Labs are reviewed and discussed with patient.  Proceed with Keytruda  Brain lesion, repeat MRI in May 2024 shows no brain metastatic disease.   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  Recommend patient to follow-up with urology, there is plan for intravesical BCG.  History of pulmonary embolism She has been off  Elqiuis 5mg  BID due to GI bleeding  Encounter for antineoplastic immunotherapy Immunotherapy treatment plan as listed above.  Normocytic anemia Chronic anemia.  Hemoglobin is stable    Orders Placed This Encounter  Procedures   CBC with Differential (Cancer Center Only)    Standing Status:   Future    Standing Expiration Date:   03/01/2024   CMP (Cancer Center only)    Standing Status:   Future    Standing Expiration Date:   03/01/2024   T4    Standing Status:   Future    Standing Expiration Date:   03/01/2024   TSH    Standing Status:   Future    Standing Expiration Date:   03/01/2024   CBC with Differential (Cancer Center  Only)    Standing Status:   Future    Standing Expiration Date:   03/22/2024   CMP (Cancer Center only)    Standing Status:   Future    Standing Expiration Date:   03/22/2024   CBC with Differential (Cancer Center Only)    Standing Status:   Future    Standing Expiration Date:   04/12/2024   CMP (Cancer Center only)    Standing Status:   Future    Standing Expiration Date:   04/12/2024   CBC with Differential (Cancer Center Only)    Standing Status:   Future    Standing Expiration Date:   05/03/2024   CMP (Cancer Center only)    Standing Status:   Future    Standing Expiration Date:   05/03/2024   T4    Standing Status:   Future    Standing Expiration Date:   05/03/2024   TSH    Standing Status:   Future    Standing Expiration Date:   05/03/2024   Follow up in 3 weeks.   All questions were answered. The patient knows to call the clinic with any problems, questions or concerns.  Rickard Patience, MD, PhD [facility] Hematology Oncology 01/19/2023       HISTORY OF PRESENTING ILLNESS:  Regina Perkins 82 y.o. 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 I have reviewed her chart and materials related  to her cancer extensively and collaborated history with the patient. Summary of oncologic history is as follows: Oncology History  Malignant neoplasm of lung (HCC)  05/24/2022 Imaging   CT chest angiogram showed 1. No evidence for pulmonary embolism. 2. Left upper lobe/prevascular mass worrisome for neoplasm. 3. Mediastinal and hilar lymphadenopathy. 4. Multiple pulmonary nodules measuring up to 11 mm worrisome for metastatic disease. 5. Small left pleural effusion. 6. Patchy ground-glass and airspace opacities in the left upper lobe worrisome for infection. 7. Moderate-sized pericardial effusion. 8. Cholelithiasis. 9. Nonobstructing left renal calculus   05/25/2022 Imaging   CT abdomen pelvis  wo contrast 1. No acute findings or explanation for the patient's symptoms. 2. No evidence of primary malignancy or metastatic disease within the abdomen or pelvis on noncontrast imaging.  3. Cholelithiasis without evidence of cholecystitis or biliary dilatation. 4. Nonobstructing left renal calculus. 5. Moderate stool throughout the colon suggesting constipation. 6.  Aortic Atherosclerosis    05/26/2022 Initial Diagnosis   Malignant neoplasm of lung - TPS 90%, TMB 10, KRAS G12V  -05/22/2022 in the emergency room, she was found to have hypoxia with oxygen levels of 86% on room air, improved to 94 with 2 L of oxygen.  D-dimer was elevated at 1.29, troponin negative.  Lower extremity Doppler was negative for DVT.  VQ scan high probability PE (Large segmental perfusion defect of the left upper lobe without corresponding radiographic abnormality. Additional bilateral wedge-shaped subsegmental branch perfusion defects .  Patient was admitted and started on heparin Echocardiogram showed no RV strain.  Moderate pericardial effusion. Over her hospitalization, she continues to have shortness of breath with minimal exertion. 05/24/2022, CT chest angiogram showed left lung mass with bilateral lung nodules.   06/16/2022 Imaging   PET scan  1. 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 No evidence of metastatic disease in the abdomen or pelvis. 2. Moderate pericardial effusion. 3. Tiny left pleural effusion. 4. Left renal stone. 5. Aortic atherosclerosis (ICD10-I70.0). Coronary artery calcification. 6. Enlarged pulmonic trunk, indicative of pulmonary arterial hypertension    06/21/2022 Procedure   US guided liver right supraclvicular node  biopsy showed Metastatic poorly differentiated carcinoma, favor squamous cell carcinoma, probably pulmonary origin.    06/28/2022 Cancer Staging   Staging form: Lung, AJCC 8th Edition - Clinical: Stage IVA (cT4, cN3, cM1a) - Signed by Rickard Patience, MD on 06/28/2022   07/14/2022 -  Chemotherapy   Patient is on Treatment Plan : LUNG NSCLC Pembrolizumab (200) q21d     09/29/2022 Imaging   CT chest abdomen pelvis w contrast showed Bilateral pulmonary nodules, some of which are waxing/waning, but favoring multifocal lung carcinoma/metastases.   Improving thoracic nodal metastases, as above.  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 Consider cystoscopy as clinically warranted.  Aortic Atherosclerosis (ICD10-I70.0) and Emphysema (ICD10-J43.9)   Urothelial carcinoma of bladder (HCC)  10/06/2022 Initial Diagnosis   Urothelial carcinoma of bladder S/p TURBT  1. 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 Bladder, biopsy, Base of tumor HIGH GRADE PAPILLARY UROTHELIAL CARCINOMA SUBMUCOSA AND MUSCULARIS PROPRIA IS PRESENT AND NOT INVOLVED BY CARCINOMA  Postprocedure, patient developed gross hematuria and was admitted to the hospital.  She  received PRBC transfusion.  Hemoglobin improved and she was discharged.    12/21/2022 Cancer Staging   Staging form: Urinary Bladder, AJCC 8th Edition - Clinical stage from 12/21/2022: Stage I (cT1, cN0, cM0) - Signed by Rickard Patience, MD on 12/29/2022 WHO/ISUP grade (low/high): High Grade Histologic grading system: 2 grade system     INTERVAL HISTORY Regina Perkins is a 82 y.o. female who has above history reviewed by me today presents for follow up visit for Lung squamous cell carcinoma She tolerates [place] well. Denies skin rash, diarrhea.  chronic SOB with exertion,  improved she has been using her inhalers.  She reports feeling well.  No new complaints.   MEDICAL HISTORY:  Past Medical History:  Diagnosis Date   Atherosclerosis of native arteries of extremity with intermittent claudication (HCC)    Bladder mass    Chronic kidney disease, stage 3a (HCC)    Diabetes mellitus without complication (HCC)    Gastrointestinal hemorrhage    Glaucoma    HTN (hypertension)    Hypertension    Hypokalemia    Hypomagnesemia    Leukocytosis    Malignant neoplasm of lung (HCC)    Malnutrition (HCC)    Normocytic anemia    Pericardial effusion    Pulmonary embolism (HCC)    Sepsis (HCC)    Type II diabetes mellitus with renal manifestations (HCC)     SURGICAL HISTORY: Past Surgical History:  Procedure Laterality Date   ABDOMINAL HYSTERECTOMY     BACK SURGERY     1988 and 1998   CYSTOSCOPY W/ RETROGRADES Bilateral 12/21/2022   Procedure: CYSTOSCOPY WITH RETROGRADE PYELOGRAM;  Surgeon: [name], MD;  Location: [facility] ORS;  Service: Urology;  Laterality: Bilateral;   ESOPHAGOGASTRODUODENOSCOPY (EGD) WITH PROPOFOL N/A 07/29/2022   Procedure: ESOPHAGOGASTRODUODENOSCOPY (EGD) WITH PROPOFOL;  Surgeon: Wyline Mood, MD;  Location: [facility] ENDOSCOPY;  Service: Gastroenterology;  Laterality: N/A;   IR IMAGING GUIDED PORT INSERTION  01/10/2023   TRANSURETHRAL RESECTION OF BLADDER TUMOR N/A 12/21/2022   Procedure: TRANSURETHRAL RESECTION OF BLADDER TUMOR (TURBT);  Surgeon: [name], MD;  Location: [facility] ORS;  Service: Urology;  Laterality: N/A;    SOCIAL HISTORY: Social History   Socioeconomic History   Marital status: Widowed    Spouse name: Not on file   Number of children: Not on file   Years of education: Not on file   Highest education level: Not on file  Occupational History   Not on file  Tobacco Use   Smoking status: Every Day    Current packs/day: 0.25    Types: Cigarettes   Smokeless tobacco: Never  Vaping Use   Vaping status: Never  Used  Substance and Sexual Activity   Alcohol use: No   Drug use: No   Sexual activity: Not on file  Other Topics Concern   Not on file  Social History Narrative   Not on file   Social Determinants of Health   Financial Resource Strain: Low Risk  (06/28/2022)   Overall Financial Resource Strain (CARDIA)    Difficulty of Paying Living Expenses: Not very hard  Food Insecurity: No Food Insecurity (12/22/2022)   Hunger Vital Sign    Worried About Running Out of Food in the Last Year: Never true    Ran Out of Food in the Last Year: Never true  Transportation Needs: No Transportation Needs (12/22/2022)   PRAPARE - Administrator, Civil Service (Medical): No    Lack of Transportation (Non-Medical):  No  Physical Activity: Not on file  Stress: No Stress Concern Present (06/28/2022)   [employer] of Occupational Health - Occupational Stress Questionnaire    Feeling of Stress : Not at all  Social Connections: Not on file  Intimate Partner Violence: Not At Risk (12/22/2022)   Humiliation, Afraid, Rape, and Kick questionnaire    Fear of Current or Ex-Partner: No    Emotionally Abused: No    Physically Abused: No    Sexually Abused: No    FAMILY HISTORY: Family History  Problem Relation Age of Onset   Asthma Mother    Heart disease Mother    Hypertension Mother    Diabetes Mother    Stroke Mother    Diabetes Sister    Diabetes Brother     ALLERGIES:  is allergic to ivp dye [iodinated contrast media] and shellfish allergy.  MEDICATIONS:  Current Outpatient Medications  Medication Sig Dispense Refill   amLODipine (NORVASC) 10 MG tablet Take 10 mg by mouth daily.     BREZTRI AEROSPHERE 160-9-4.8 MCG/ACT AERO Inhale 1 puff into the lungs daily.     cyanocobalamin (VITAMIN B12) 1000 MCG tablet Take 1 tablet (1,000 mcg total) by mouth daily. 90 tablet 0   Fluticasone-Umeclidin-Vilant (TRELEGY ELLIPTA) 100-62.5-25 MCG/ACT AEPB Inhale into the lungs.     lisinopril  (ZESTRIL) 40 MG tablet Take 40 mg by mouth daily.     metFORMIN (GLUCOPHAGE) 1000 MG tablet Take 1,000 mg by mouth 2 (two) times daily with a meal.     montelukast (SINGULAIR) 10 MG tablet Take 10 mg by mouth at bedtime.     omeprazole (PRILOSEC) 40 MG capsule Take 1 capsule (40 mg total) by mouth in the morning and at bedtime. 60 capsule 2   predniSONE (DELTASONE) 50 MG tablet Take 1 tablet (50 mg total) by mouth See admin instructions. Prednisone - take 50 mg by mouth at 13 hours, 7 hours, and 1 hour before contrast media injection 3 tablet 0   trospium (SANCTURA) 20 MG tablet Take 1 tablet (20 mg total) by mouth 2 (two) times daily as needed (frequency,urgency,bladder spasm). 20 tablet 0   No current facility-administered medications for this visit.    Review of Systems  Constitutional:  Negative for appetite change, chills, fatigue and fever.  HENT:   Negative for hearing loss and voice change.   Eyes:  Negative for eye problems.  Respiratory:  Positive for shortness of breath. Negative for chest tightness and cough.   Cardiovascular:  Negative for chest pain.  Gastrointestinal:  Negative for abdominal distention, abdominal pain and blood in stool.  Endocrine: Negative for hot flashes.  Genitourinary:  Negative for difficulty urinating and frequency.   Musculoskeletal:  Negative for arthralgias.  Skin:  Negative for itching and rash.  Neurological:  Negative for extremity weakness.  Hematological:  Negative for adenopathy.  Psychiatric/Behavioral:  Negative for confusion.      PHYSICAL EXAMINATION: ECOG PERFORMANCE STATUS: 2 - Symptomatic, <50% confined to bed  Vitals:   01/19/23 0926  BP: (!) 144/68  Pulse: 90  Resp: 18  Temp: 97.9 F (36.6 C)  SpO2: 100%   Filed Weights   01/19/23 0926  Weight: 114 lb 14.4 oz (52.1 kg)    Physical Exam Constitutional:      General: She is not in acute distress.    Appearance: She is not diaphoretic.  HENT:     Head:  Normocephalic.  Eyes:     General: No scleral icterus.  Pupils: Pupils are equal, round, and reactive to light.  Cardiovascular:     Heart sounds: No murmur heard. Pulmonary:     Effort: Pulmonary effort is normal. No respiratory distress.     Comments: Decreased breath sounds bilaterally Abdominal:     General: There is no distension.  Musculoskeletal:        General: Normal range of motion.     Cervical back: Normal range of motion.  Skin:    Findings: No erythema.  Neurological:     Mental Status: She is alert and oriented to person, place, and time. Mental status is at baseline.     Cranial Nerves: No cranial nerve deficit.     Motor: No abnormal muscle tone.  Psychiatric:        Mood and Affect: Affect normal.      LABORATORY DATA:  I have reviewed the data as listed    Latest Ref Rng & Units 01/19/2023    9:25 AM 12/29/2022    8:40 AM 12/25/2022    9:42 AM  CBC  WBC 4.0 - 10.5 K/uL 9.1  9.1    Hemoglobin 12.0 - 15.0 g/dL 08.6  57.8  46.9   Hematocrit 36.0 - 46.0 % 37.5  36.6  35.5   Platelets 150 - 400 K/uL 272  295        Latest Ref Rng & Units 01/19/2023    9:25 AM 12/29/2022    8:40 AM 12/25/2022    9:42 AM  CMP  Glucose 70 - 99 mg/dL 629  528  413   BUN 8 - 23 mg/dL 17  12  10    Creatinine 0.44 - 1.00 mg/dL 2.44  0.10  2.72   Sodium 135 - 145 mmol/L 137  138  135   Potassium 3.5 - 5.1 mmol/L 3.5  3.6  3.0   Chloride 98 - 111 mmol/L 104  103  102   CO2 22 - 32 mmol/L 25  26  24    Calcium 8.9 - 10.3 mg/dL 9.0  9.0  8.4   Total Protein 6.5 - 8.1 g/dL 7.1  6.9    Total Bilirubin 0.3 - 1.2 mg/dL 0.4  0.5    Alkaline Phos 38 - 126 U/L 76  61    AST 15 - 41 U/L 33  26    ALT 0 - 44 U/L 27  26       RADIOGRAPHIC STUDIES: I have personally reviewed the radiological images as listed and agreed with the findings in the report. CT CHEST ABDOMEN PELVIS W CONTRAST  Result Date: 01/18/2023 CLINICAL DATA:  Metastatic squamous cell lung cancer restaging, additional  history of urothelial malignancy * Tracking Code: BO * EXAM: CT CHEST, ABDOMEN, AND PELVIS WITH CONTRAST TECHNIQUE: Multidetector CT imaging of the chest, abdomen and pelvis was performed following the standard protocol during bolus administration of intravenous contrast. RADIATION DOSE REDUCTION: This exam was performed according to the departmental dose-optimization program which includes automated exposure control, adjustment of the mA and/or kV according to patient size and/or use of iterative reconstruction technique. CONTRAST:  OMNIPAQUE IOHEXOL 300 MG/ML SOLN, additional oral enteric contrast COMPARISON:  09/29/2022 FINDINGS: CT CHEST FINDINGS Cardiovascular: Aortic atherosclerosis. Normal heart size. Scattered left coronary artery calcifications. No pericardial effusion. Mediastinum/Nodes: No significant change in matted post treatment prevascular and left superior mediastinal soft tissue (series 2, image 22, 18). No discretely enlarged mediastinal, hilar, or axillary lymph nodes. Thyroid gland, trachea, and esophagus demonstrate no significant  findings. Lungs/Pleura: Interval decrease in size of multiple spiculated and subsolid pulmonary nodules, index nodule in the peripheral left upper lobe measuring 0.8 x 0.7 cm, previously 1.3 x 1.0 cm (series 4, image 41), additional ground-glass nodule of the anterior right lower lobe abutting the major fissure almost completely resolved, now measuring no greater than 0.5 cm, previously 1.4 x 0.8 cm (series 4, image 70) additional partially cystic spiculated index nodule of the superior segment left lower lobe measuring 1.7 x 1.4 cm, previously 2.5 x 2.1 cm (series 4, image 50). No pleural effusion or pneumothorax. Musculoskeletal: Pectus deformity.  No acute osseous findings. CT ABDOMEN PELVIS FINDINGS Hepatobiliary: No solid liver abnormality is seen. Multiple fluid attenuation liver cysts, unchanged, benign, requiring no further follow-up or characterization.  Small gallstones. No gallbladder wall thickening, or biliary dilatation. Pancreas: Unremarkable. No pancreatic ductal dilatation or surrounding inflammatory changes. Spleen: Normal in size without significant abnormality. Adrenals/Urinary Tract: Adrenal glands are unremarkable. Small nonobstructive calculus of the posterior midportion of the left kidney. The right kidney is normal, without renal calculi, solid lesion, or hydronephrosis. Diminished size of an endoluminal bladder mass seen on prior examination, soft tissue in this vicinity now measuring no greater than 2.1 x 1.4 cm, previously at least 4.7 x 2.1 cm (series 2, image 105). Stomach/Bowel: Stomach is within normal limits. Large transverse duodenal diverticulum. Appendix appears normal. No evidence of bowel wall thickening, distention, or inflammatory changes. Vascular/Lymphatic: Severe aortic atherosclerosis. No enlarged abdominal or pelvic lymph nodes. Reproductive: Status hysterectomy. Other: No abdominal wall hernia or abnormality. No ascites. Musculoskeletal: No acute osseous findings. IMPRESSION: 1. Interval decrease in size of multiple spiculated and subsolid pulmonary nodules, consistent with treatment response. 2. No significant change in matted post treatment prevascular and left superior mediastinal soft tissue. 3. Diminished size of an endoluminal bladder mass seen on prior examination, soft tissue in this vicinity now measuring no greater than 2.1 x 1.4 cm, previously at least 4.7 x 2.1 cm. Presumably this has been at least partially resected. 4. No evidence of lymphadenopathy or metastatic disease in the abdomen or pelvis. 5. Cholelithiasis. 6. Nonobstructive left nephrolithiasis. 7. Coronary artery disease. Aortic Atherosclerosis (ICD10-I70.0). Electronically Signed   By: Jearld Lesch M.D.   On: 01/18/2023 07:44   IR IMAGING GUIDED PORT INSERTION  Result Date: 01/10/2023 INDICATION: Chemotherapy access EXAM: Chest port placement using  ultrasound and fluoroscopic guidance MEDICATIONS: Documented in the EMR ANESTHESIA/SEDATION: Moderate (conscious) sedation was employed during this procedure. A total of Versed 1.5 mg and Fentanyl 75 mcg was administered intravenously. Moderate Sedation Time: 24 minutes. The patient's level of consciousness and vital signs were monitored continuously by radiology nursing throughout the procedure under my direct supervision. FLUOROSCOPY TIME:  Fluoroscopy Time: 0.4 minutes (1 mGy) COMPLICATIONS: None immediate. PROCEDURE: Informed written consent was obtained from the patient after a thorough discussion of the procedural risks, benefits and alternatives. All questions were addressed. Maximal Sterile Barrier Technique was utilized including caps, mask, sterile gowns, sterile gloves, sterile drape, hand hygiene and skin antiseptic. A timeout was performed prior to the initiation of the procedure. The patient was placed supine on the exam table. The right neck and chest was prepped and draped in the standard sterile fashion. A preliminary ultrasound of the right neck was performed and demonstrates a patent right internal jugular vein. A permanent ultrasound image was stored in the electronic medical record. The overlying skin was anesthetized with 1% Lidocaine. Using ultrasound guidance, access was obtained into the right internal  jugular vein using a 21 gauge micropuncture set. A wire was advanced into the SVC, a short incision was made at the puncture site, and serial dilatation performed. Next, in an ipsilateral infraclavicular location, an incision was made at the site of the subcutaneous reservoir. Blunt dissection was used to open a pocket to contain the reservoir. A subcutaneous tunnel was then created from the port site to the puncture site. A(n) 8 Fr single lumen catheter was advanced through the tunnel. The catheter was attached to the port and this was placed in the subcutaneous pocket. Under fluoroscopic  guidance, a peel away sheath was placed, and the catheter was trimmed to the appropriate length and was advanced into the central veins. The tip of the catheter lies near the superior cavoatrial junction. The port flushes and aspirates appropriately. The port was flushed and locked with heparinized saline. The port pocket was closed in 2 layers using 3-0 and 4-0 Vicryl/absorbable suture. Dermabond was also applied to both incisions. The patient tolerated the procedure well and was transferred to recovery in stable condition. IMPRESSION: Successful placement of a right-sided chest port via the right internal jugular vein. The port is ready for immediate use. Electronically Signed   By: Olive Bass M.D.   On: 01/10/2023 15:03   DG OR UROLOGY CYSTO IMAGE ([facility] ONLY)  Result Date: 12/21/2022 There is no interpretation for this exam.  This order is for images obtained during a surgical procedure.  Please See "Surgeries" Tab for more information regarding the procedure.

## 2023-01-19 NOTE — Progress Notes (Signed)
Nutrition Follow-up:  Patient with stage IV lung cancer. S/p TURBT for urothelial carcinoma of bladder, planning intravesical BCG.  Patient receiving keytruda  Met with patient during infusion.  Reports that her appetite is good.  Continue to drink ensure (350 calorie) 3+ times a day.  Ate a bacon, egg and cheese biscuit this am.  Denies nutrition impact symptoms at this time    Medications: reviewed  Labs: reviewed  Anthropometrics:   Weight  114 lb 14.4 oz today  116 lb 6.4 oz on 8/14 115 lb on 6/12 118 lb on 5/1 116 lb on 4/9 115  lb on 3/15  NUTRITION DIAGNOSIS: Inadequate oral intake ongoing with recent weight loss and hospitalization    INTERVENTION:  Continue high calorie, high protein foods Continue 350 calorie shakes for added nutrition and to help maintain weight    MONITORING, EVALUATION, GOAL: weight trends, intake   NEXT VISIT: Wednesday, Nov 6 during infusion  Aloni Chuang B. Freida Busman, RD, LDN Registered Dietitian [phone]

## 2023-01-19 NOTE — Assessment & Plan Note (Signed)
She has been off  Elqiuis 5mg  BID due to GI bleeding

## 2023-01-19 NOTE — Assessment & Plan Note (Addendum)
Non-small cell lung cancer, favor squamous cell carcinoma, stage IV- TPS 90%, TMB 10, KRAS G12V MRI brain with and without contrast -  Left occipital focus of prior hemorrhage with mild linear contrast enhancement. Possible small metastasis or microhemmorhagic event.  TPS 90%, CT scan showed partial response. .Labs are reviewed and discussed with patient.  Proceed with Keytruda  Brain lesion, repeat MRI in May 2024 shows no brain metastatic disease.

## 2023-01-19 NOTE — Assessment & Plan Note (Signed)
Chronic anemia.  Hemoglobin is stable

## 2023-01-19 NOTE — Assessment & Plan Note (Signed)
Non-muscle invasive bladder cancer. S/p TURBT, T1 lesion, high grade.  Recommend patient to follow-up with urology, there is plan for intravesical BCG.

## 2023-01-20 ENCOUNTER — Encounter: Payer: Self-pay | Admitting: Gastroenterology

## 2023-01-21 ENCOUNTER — Ambulatory Visit: Payer: Medicare Other

## 2023-01-21 ENCOUNTER — Ambulatory Visit
Admission: RE | Admit: 2023-01-21 | Discharge: 2023-01-21 | Disposition: A | Discharge status: Home / Self Care | Payer: Medicare Other | Attending: Gastroenterology | Admitting: Gastroenterology

## 2023-01-21 ENCOUNTER — Encounter
Admission: RE | Disposition: A | Discharge status: Home / Self Care | Payer: Self-pay | Source: Home / Self Care | Attending: Gastroenterology

## 2023-01-21 ENCOUNTER — Encounter: Payer: Self-pay | Admitting: Gastroenterology

## 2023-01-21 DIAGNOSIS — Z8719 Personal history of other diseases of the digestive system: Secondary | ICD-10-CM

## 2023-01-21 DIAGNOSIS — Z8249 Family history of ischemic heart disease and other diseases of the circulatory system: Secondary | ICD-10-CM | POA: Insufficient documentation

## 2023-01-21 DIAGNOSIS — K269 Duodenal ulcer, unspecified as acute or chronic, without hemorrhage or perforation: Secondary | ICD-10-CM | POA: Diagnosis not present

## 2023-01-21 DIAGNOSIS — Z833 Family history of diabetes mellitus: Secondary | ICD-10-CM | POA: Insufficient documentation

## 2023-01-21 DIAGNOSIS — F17210 Nicotine dependence, cigarettes, uncomplicated: Secondary | ICD-10-CM | POA: Insufficient documentation

## 2023-01-21 DIAGNOSIS — J449 Chronic obstructive pulmonary disease, unspecified: Secondary | ICD-10-CM | POA: Diagnosis not present

## 2023-01-21 DIAGNOSIS — K264 Chronic or unspecified duodenal ulcer with hemorrhage: Secondary | ICD-10-CM | POA: Diagnosis present

## 2023-01-21 DIAGNOSIS — Z85118 Personal history of other malignant neoplasm of bronchus and lung: Secondary | ICD-10-CM | POA: Diagnosis not present

## 2023-01-21 DIAGNOSIS — I129 Hypertensive chronic kidney disease with stage 1 through stage 4 chronic kidney disease, or unspecified chronic kidney disease: Secondary | ICD-10-CM | POA: Diagnosis not present

## 2023-01-21 DIAGNOSIS — E1122 Type 2 diabetes mellitus with diabetic chronic kidney disease: Secondary | ICD-10-CM | POA: Insufficient documentation

## 2023-01-21 DIAGNOSIS — Z09 Encounter for follow-up examination after completed treatment for conditions other than malignant neoplasm: Secondary | ICD-10-CM | POA: Insufficient documentation

## 2023-01-21 DIAGNOSIS — N1831 Chronic kidney disease, stage 3a: Secondary | ICD-10-CM | POA: Diagnosis not present

## 2023-01-21 HISTORY — PX: ESOPHAGOGASTRODUODENOSCOPY (EGD) WITH PROPOFOL: SHX5813

## 2023-01-21 SURGERY — ESOPHAGOGASTRODUODENOSCOPY (EGD) WITH PROPOFOL
Anesthesia: General

## 2023-01-21 MED ORDER — PROPOFOL 500 MG/50ML IV EMUL
INTRAVENOUS | Status: DC | PRN
  Administered 2023-01-21: 50 mg via INTRAVENOUS
  Administered 2023-01-21: 100 mg via INTRAVENOUS

## 2023-01-21 MED ORDER — LIDOCAINE HCL (PF) 2 % IJ SOLN
INTRAMUSCULAR | Status: DC | PRN
  Administered 2023-01-21: 100 mg via INTRADERMAL

## 2023-01-21 MED ORDER — SODIUM CHLORIDE 0.9 % IV SOLN: INTRAVENOUS | Status: DC

## 2023-01-21 MED ORDER — HEPARIN SOD (PORK) LOCK FLUSH 100 UNIT/ML IV SOLN
250.0000 [IU] | INTRAVENOUS | Status: AC | PRN
  Administered 2023-01-21: 250 [IU]

## 2023-01-21 MED ORDER — PROPOFOL 10 MG/ML IV BOLUS
INTRAVENOUS | Status: AC
  Filled 2023-01-21: qty 20

## 2023-01-21 MED ORDER — HEPARIN SOD (PORK) LOCK FLUSH 100 UNIT/ML IV SOLN
INTRAVENOUS | Status: AC
  Filled 2023-01-21: qty 5

## 2023-01-21 MED ORDER — SODIUM CHLORIDE 0.9% FLUSH
10.0000 mL | INTRAVENOUS | Status: AC | PRN
  Administered 2023-01-21: 10 mL

## 2023-01-21 MED ORDER — LIDOCAINE HCL (PF) 2 % IJ SOLN
INTRAMUSCULAR | Status: AC
  Filled 2023-01-21: qty 5

## 2023-01-21 NOTE — Op Note (Signed)
[facility] Patient Name: Regina Perkins Procedure Date: 01/21/2023 11:01 AM MRN: [id number] Account #: [id number] Date of Birth: 27-Jul-1940 Admit Type: Outpatient Age: 82 Room: [facility] ENDO ROOM 4 Gender: Female Note Status: Finalized Instrument Name: Upper Endoscope [id number] Procedure:             Upper GI endoscopy Indications:           Follow-up of acute duodenal ulcer with hemorrhage Providers:             Wyline Mood MD, MD Referring MD:          [facility] Medicines:             Monitored Anesthesia Care Complications:         No immediate complications. Procedure:             Pre-Anesthesia Assessment:                        - Prior to the procedure, a History and Physical was                         performed, and patient medications, allergies and                         sensitivities were reviewed. The patient's tolerance                         of previous anesthesia was reviewed.                        - The risks and benefits of the procedure and the                         sedation options and risks were discussed with the                         patient. All questions were answered and informed                         consent was obtained.                        - ASA Grade Assessment: II - A patient with mild                         systemic disease.                        After obtaining informed consent, the endoscope was                         passed under direct vision. Throughout the procedure,                         the patient's blood pressure, pulse, and oxygen                         saturations were monitored continuously. The Endoscope  was introduced through the mouth, and advanced to the                         third part of duodenum. The upper GI endoscopy was                         accomplished with ease. The patient tolerated the                         procedure  well. Findings:      The esophagus was normal.      The stomach was normal.      The examined duodenum was normal. Impression:            - Normal esophagus.                        - Normal stomach.                        - Normal examined duodenum.                        - No specimens collected. Recommendation:        - Discharge patient to home (with escort).                        - Resume previous diet.                        - Continue present medications.                        - Return to my office as previously scheduled. Procedure Code(s):     --- Professional ---                        [phone], Esophagogastroduodenoscopy, flexible,                         transoral; diagnostic, including collection of                         specimen(s) by brushing or washing, when performed                         (separate procedure) Diagnosis Code(s):     --- Professional ---                        K26.0, Acute duodenal ulcer with hemorrhage CPT copyright 2022 American Medical Association. All rights reserved. The codes documented in this report are preliminary and upon coder review may  be revised to meet current compliance requirements. Wyline Mood, MD Wyline Mood MD, MD 01/21/2023 11:18:43 AM This report has been signed electronically. Number of Addenda: 0 Note Initiated On: 01/21/2023 11:01 AM Estimated Blood Loss:  Estimated blood loss: none.      [facility]

## 2023-01-21 NOTE — Anesthesia Preprocedure Evaluation (Signed)
Anesthesia Evaluation  Patient identified by MRN, date of birth, ID band Patient awake    Reviewed: Allergy & Precautions, NPO status , Patient's Chart, lab work & pertinent test results  History of Anesthesia Complications Negative for: history of anesthetic complications  Airway Mallampati: III  TM Distance: >3 FB Neck ROM: full    Dental  (+) Chipped, Dental Advidsory Given   Pulmonary shortness of breath and with exertion, COPD, Current Smoker and Patient abstained from smoking. Hx of lung cancer   Pulmonary exam normal        Cardiovascular Exercise Tolerance: Good hypertension, (-) angina negative cardio ROS Normal cardiovascular exam     Neuro/Psych negative neurological ROS  negative psych ROS   GI/Hepatic Neg liver ROS,GERD  Medicated,,  Endo/Other  negative endocrine ROSdiabetes, Type 2    Renal/GU CRFRenal disease     Musculoskeletal   Abdominal   Peds  Hematology negative hematology ROS (+)   Anesthesia Other Findings Past Medical History: No date: Atherosclerosis of native arteries of extremity with  intermittent claudication (HCC) No date: Bladder mass No date: Chronic kidney disease, stage 3a (HCC) No date: Diabetes mellitus without complication (HCC) No date: Gastrointestinal hemorrhage No date: Glaucoma No date: HTN (hypertension) No date: Hypertension No date: Hypokalemia No date: Hypomagnesemia No date: Leukocytosis No date: Malignant neoplasm of lung (HCC) No date: Malnutrition (HCC) No date: Normocytic anemia No date: Pericardial effusion No date: Pulmonary embolism (HCC) No date: Sepsis (HCC) No date: Type II diabetes mellitus with renal manifestations (HCC)  Past Surgical History: No date: ABDOMINAL HYSTERECTOMY No date: BACK SURGERY     Comment:  1988 and 1998 07/29/2022: ESOPHAGOGASTRODUODENOSCOPY (EGD) WITH PROPOFOL; N/A     Comment:  Procedure: ESOPHAGOGASTRODUODENOSCOPY  (EGD) WITH               PROPOFOL;  Surgeon: Wyline Mood, MD;  Location: [facility]               ENDOSCOPY;  Service: Gastroenterology;  Laterality: N/A;     Reproductive/Obstetrics negative OB ROS                             Anesthesia Physical Anesthesia Plan  ASA: 3  Anesthesia Plan: General   Post-op Pain Management: Minimal or no pain anticipated   Induction: Intravenous  PONV Risk Score and Plan: 3 and Propofol infusion, TIVA and Ondansetron  Airway Management Planned: Nasal Cannula  Additional Equipment: None  Intra-op Plan:   Post-operative Plan: Extubation in OR  Informed Consent: I have reviewed the patients History and Physical, chart, labs and discussed the procedure including the risks, benefits and alternatives for the proposed anesthesia with the patient or authorized representative who has indicated his/her understanding and acceptance.     Dental advisory given  Plan Discussed with: CRNA and Surgeon  Anesthesia Plan Comments: (Discussed risks of anesthesia with patient, including possibility of difficulty with spontaneous ventilation under anesthesia necessitating airway intervention, PONV, and rare risks such as cardiac or respiratory or neurological events, and allergic reactions. Discussed the role of CRNA in patient's perioperative care. Patient understands.)       Anesthesia Quick Evaluation

## 2023-01-21 NOTE — Transfer of Care (Signed)
Immediate Anesthesia Transfer of Care Note  Patient: Regina Perkins  Procedure(s) Performed: ESOPHAGOGASTRODUODENOSCOPY (EGD) WITH PROPOFOL  Patient Location: PACU  Anesthesia Type:General  Level of Consciousness: awake  Airway & Oxygen Therapy: Patient Spontanous Breathing  Post-op Assessment: Report given to RN and Post -op Vital signs reviewed and stable  Post vital signs: Reviewed and stable  Last Vitals:  Vitals Value Taken Time  BP 166/77 01/21/23 1121  Temp    Pulse 81 01/21/23 1121  Resp 18 01/21/23 1121  SpO2 98 % 01/21/23 1121  Vitals shown include unfiled device data.  Last Pain:  Vitals:   01/21/23 1046  TempSrc: Temporal         Complications: There were no known notable events for this encounter.

## 2023-01-21 NOTE — H&P (Signed)
Wyline Mood, MD [address], [place], [place], [address] [address], [address], [place], [place], [address] Phone: [phone]  Fax: [phone]  Primary Care Physician:  [facility]   Pre-Procedure History & Physical: HPI:  Regina Perkins is a 82 y.o. female is here for an endoscopy    Past Medical History:  Diagnosis Date   Atherosclerosis of native arteries of extremity with intermittent claudication (HCC)    Bladder mass    Chronic kidney disease, stage 3a (HCC)    Diabetes mellitus without complication (HCC)    Gastrointestinal hemorrhage    Glaucoma    HTN (hypertension)    Hypertension    Hypokalemia    Hypomagnesemia    Leukocytosis    Malignant neoplasm of lung (HCC)    Malnutrition (HCC)    Normocytic anemia    Pericardial effusion    Pulmonary embolism (HCC)    Sepsis (HCC)    Type II diabetes mellitus with renal manifestations (HCC)     Past Surgical History:  Procedure Laterality Date   ABDOMINAL HYSTERECTOMY     BACK SURGERY     1988 and 1998   CYSTOSCOPY W/ RETROGRADES Bilateral 12/21/2022   Procedure: CYSTOSCOPY WITH RETROGRADE PYELOGRAM;  Surgeon: [name], MD;  Location: [facility] ORS;  Service: Urology;  Laterality: Bilateral;   ESOPHAGOGASTRODUODENOSCOPY (EGD) WITH PROPOFOL N/A 07/29/2022   Procedure: ESOPHAGOGASTRODUODENOSCOPY (EGD) WITH PROPOFOL;  Surgeon: Wyline Mood, MD;  Location: [facility] ENDOSCOPY;  Service: Gastroenterology;  Laterality: N/A;   IR IMAGING GUIDED PORT INSERTION  01/10/2023   TRANSURETHRAL RESECTION OF BLADDER TUMOR N/A 12/21/2022   Procedure: TRANSURETHRAL RESECTION OF BLADDER TUMOR (TURBT);  Surgeon: [name], MD;  Location: [facility] ORS;  Service: Urology;  Laterality: N/A;    Prior to Admission medications   Medication Sig Start Date End Date Taking? Authorizing Provider  amLODipine (NORVASC) 10 MG tablet Take 10 mg by mouth daily.    [provider]  BREZTRI  AEROSPHERE 160-9-4.8 MCG/ACT AERO Inhale 1 puff into the lungs daily. 06/03/22 06/03/23  [provider]  cyanocobalamin (VITAMIN B12) 1000 MCG tablet Take 1 tablet (1,000 mcg total) by mouth daily. 08/25/22   Rickard Patience, MD  Fluticasone-Umeclidin-Vilant (TRELEGY ELLIPTA) 100-62.5-25 MCG/ACT AEPB Inhale into the lungs. 06/03/22   [provider]  lidocaine-prilocaine (EMLA) cream Apply 1 Application topically as needed. Apply to port and cover with saran wrap 1-2 hours prior to port access 01/19/23   Rickard Patience, MD  lisinopril (ZESTRIL) 40 MG tablet Take 40 mg by mouth daily.    [provider]  metFORMIN (GLUCOPHAGE) 1000 MG tablet Take 1,000 mg by mouth 2 (two) times daily with a meal.    [provider]  montelukast (SINGULAIR) 10 MG tablet Take 10 mg by mouth at bedtime.    [provider]  omeprazole (PRILOSEC) 40 MG capsule Take 1 capsule (40 mg total) by mouth in the morning and at bedtime. 07/29/22   Arnetha Courser, MD  predniSONE (DELTASONE) 50 MG tablet Take 1 tablet (50 mg total) by mouth See admin instructions. Prednisone - take 50 mg by mouth at 13 hours, 7 hours, and 1 hour before contrast media injection 12/29/22   Rickard Patience, MD  trospium (SANCTURA) 20 MG tablet Take 1 tablet (20 mg total) by mouth 2 (two) times daily as needed (frequency,urgency,bladder spasm). 12/21/22   [name], MD    Allergies as of 11/11/2022 - Review  Complete 11/11/2022  Allergen Reaction Noted   Ivp dye [iodinated contrast media]  06/08/2015   Shellfish allergy Other (See Comments) 12/30/2015    Family History  Problem Relation Age of Onset   Asthma Mother    Heart disease Mother    Hypertension Mother    Diabetes Mother    Stroke Mother    Diabetes Sister    Diabetes Brother     Social History   Socioeconomic History   Marital status: Widowed    Spouse name: Not on file   Number of children: Not on file   Years of education: Not on file   Highest education  level: Not on file  Occupational History   Not on file  Tobacco Use   Smoking status: Every Day    Current packs/day: 0.25    Types: Cigarettes   Smokeless tobacco: Never  Vaping Use   Vaping status: Never Used  Substance and Sexual Activity   Alcohol use: No   Drug use: No   Sexual activity: Not on file  Other Topics Concern   Not on file  Social History Narrative   Not on file   Social Determinants of Health   Financial Resource Strain: Low Risk  (06/28/2022)   Overall Financial Resource Strain (CARDIA)    Difficulty of Paying Living Expenses: Not very hard  Food Insecurity: No Food Insecurity (12/22/2022)   Hunger Vital Sign    Worried About Running Out of Food in the Last Year: Never true    Ran Out of Food in the Last Year: Never true  Transportation Needs: No Transportation Needs (12/22/2022)   PRAPARE - Administrator, Civil Service (Medical): No    Lack of Transportation (Non-Medical): No  Physical Activity: Not on file  Stress: No Stress Concern Present (06/28/2022)   [employer] of Occupational Health - Occupational Stress Questionnaire    Feeling of Stress : Not at all  Social Connections: Not on file  Intimate Partner Violence: Not At Risk (12/22/2022)   Humiliation, Afraid, Rape, and Kick questionnaire    Fear of Current or Ex-Partner: No    Emotionally Abused: No    Physically Abused: No    Sexually Abused: No    Review of Systems: See HPI, otherwise negative ROS  Physical Exam: There were no vitals taken for this visit. General:   Alert,  pleasant and cooperative in NAD Head:  Normocephalic and atraumatic. Neck:  Supple; no masses or thyromegaly. Lungs:  Clear throughout to auscultation, normal respiratory effort.    Heart:  +S1, +S2, Regular rate and rhythm, No edema. Abdomen:  Soft, nontender and nondistended. Normal bowel sounds, without guarding, and without rebound.   Neurologic:  Alert and  oriented x4;  grossly normal  neurologically.  Impression/Plan: Regina Perkins is here for an endoscopy  to be performed for  evaluation of duodenal ulcer    Risks, benefits, limitations, and alternatives regarding endoscopy have been reviewed with the patient.  Questions have been answered.  All parties agreeable.   Wyline Mood, MD  01/21/2023, 10:23 AM

## 2023-01-21 NOTE — Anesthesia Postprocedure Evaluation (Signed)
Anesthesia Post Note  Patient: Regina Perkins  Procedure(s) Performed: ESOPHAGOGASTRODUODENOSCOPY (EGD) WITH PROPOFOL  Patient location during evaluation: Endoscopy Anesthesia Type: General Level of consciousness: awake and alert Pain management: pain level controlled Vital Signs Assessment: post-procedure vital signs reviewed and stable Respiratory status: spontaneous breathing, nonlabored ventilation, respiratory function stable and patient connected to nasal cannula oxygen Cardiovascular status: blood pressure returned to baseline and stable Postop Assessment: no apparent nausea or vomiting Anesthetic complications: no  There were no known notable events for this encounter.   Last Vitals:  Vitals:   01/21/23 1128 01/21/23 1147  BP:  (!) 151/77  Pulse:  80  Resp:  16  Temp: (!) 35.6 C   SpO2:  98%    Last Pain:  Vitals:   01/21/23 1147  TempSrc:   PainSc: 0-No pain                 Stephanie Coup

## 2023-01-24 ENCOUNTER — Encounter: Payer: Self-pay | Admitting: Gastroenterology

## 2023-02-09 ENCOUNTER — Inpatient Hospital Stay: Payer: Medicare Other

## 2023-02-09 ENCOUNTER — Encounter: Payer: Self-pay | Admitting: Oncology

## 2023-02-09 ENCOUNTER — Inpatient Hospital Stay (HOSPITAL_BASED_OUTPATIENT_CLINIC_OR_DEPARTMENT_OTHER): Payer: Medicare Other | Admitting: Oncology

## 2023-02-09 ENCOUNTER — Inpatient Hospital Stay: Payer: Medicare Other | Attending: Oncology

## 2023-02-09 VITALS — BP 140/62 | HR 101 | Temp 97.6°F | Wt 117.0 lb

## 2023-02-09 VITALS — BP 129/73 | HR 92

## 2023-02-09 DIAGNOSIS — C3482 Malignant neoplasm of overlapping sites of left bronchus and lung: Secondary | ICD-10-CM

## 2023-02-09 DIAGNOSIS — Z5112 Encounter for antineoplastic immunotherapy: Secondary | ICD-10-CM

## 2023-02-09 DIAGNOSIS — M25561 Pain in right knee: Secondary | ICD-10-CM | POA: Insufficient documentation

## 2023-02-09 DIAGNOSIS — Z86711 Personal history of pulmonary embolism: Secondary | ICD-10-CM | POA: Diagnosis not present

## 2023-02-09 DIAGNOSIS — F17210 Nicotine dependence, cigarettes, uncomplicated: Secondary | ICD-10-CM | POA: Insufficient documentation

## 2023-02-09 DIAGNOSIS — D649 Anemia, unspecified: Secondary | ICD-10-CM | POA: Insufficient documentation

## 2023-02-09 DIAGNOSIS — C3412 Malignant neoplasm of upper lobe, left bronchus or lung: Secondary | ICD-10-CM | POA: Insufficient documentation

## 2023-02-09 DIAGNOSIS — Z79899 Other long term (current) drug therapy: Secondary | ICD-10-CM | POA: Diagnosis not present

## 2023-02-09 DIAGNOSIS — Z7952 Long term (current) use of systemic steroids: Secondary | ICD-10-CM | POA: Diagnosis not present

## 2023-02-09 DIAGNOSIS — C679 Malignant neoplasm of bladder, unspecified: Secondary | ICD-10-CM

## 2023-02-09 DIAGNOSIS — M25562 Pain in left knee: Secondary | ICD-10-CM

## 2023-02-09 DIAGNOSIS — C771 Secondary and unspecified malignant neoplasm of intrathoracic lymph nodes: Secondary | ICD-10-CM | POA: Diagnosis not present

## 2023-02-09 DIAGNOSIS — C787 Secondary malignant neoplasm of liver and intrahepatic bile duct: Secondary | ICD-10-CM | POA: Insufficient documentation

## 2023-02-09 LAB — CBC WITH DIFFERENTIAL (CANCER CENTER ONLY)
Abs Immature Granulocytes: 0.01 10*3/uL (ref 0.00–0.07)
Basophils Absolute: 0 10*3/uL (ref 0.0–0.1)
Basophils Relative: 1 %
Eosinophils Absolute: 0.2 10*3/uL (ref 0.0–0.5)
Eosinophils Relative: 3 %
HCT: 36.2 % (ref 36.0–46.0)
Hemoglobin: 11.5 g/dL — ABNORMAL LOW (ref 12.0–15.0)
Immature Granulocytes: 0 %
Lymphocytes Relative: 27 %
Lymphs Abs: 1.7 10*3/uL (ref 0.7–4.0)
MCH: 28.1 pg (ref 26.0–34.0)
MCHC: 31.8 g/dL (ref 30.0–36.0)
MCV: 88.5 fL (ref 80.0–100.0)
Monocytes Absolute: 0.4 10*3/uL (ref 0.1–1.0)
Monocytes Relative: 6 %
Neutro Abs: 3.9 10*3/uL (ref 1.7–7.7)
Neutrophils Relative %: 63 %
Platelet Count: 265 10*3/uL (ref 150–400)
RBC: 4.09 MIL/uL (ref 3.87–5.11)
RDW: 17.8 % — ABNORMAL HIGH (ref 11.5–15.5)
WBC Count: 6.2 10*3/uL (ref 4.0–10.5)
nRBC: 0 % (ref 0.0–0.2)

## 2023-02-09 LAB — CMP (CANCER CENTER ONLY)
ALT: 24 U/L (ref 0–44)
AST: 30 U/L (ref 15–41)
Albumin: 3.9 g/dL (ref 3.5–5.0)
Alkaline Phosphatase: 71 U/L (ref 38–126)
Anion gap: 8 (ref 5–15)
BUN: 27 mg/dL — ABNORMAL HIGH (ref 8–23)
CO2: 26 mmol/L (ref 22–32)
Calcium: 9.2 mg/dL (ref 8.9–10.3)
Chloride: 101 mmol/L (ref 98–111)
Creatinine: 1.1 mg/dL — ABNORMAL HIGH (ref 0.44–1.00)
GFR, Estimated: 50 mL/min — ABNORMAL LOW (ref >60)
Glucose, Bld: 175 mg/dL — ABNORMAL HIGH (ref 70–99)
Potassium: 3.6 mmol/L (ref 3.5–5.1)
Sodium: 135 mmol/L (ref 135–145)
Total Bilirubin: 0.6 mg/dL (ref 0.3–1.2)
Total Protein: 7.1 g/dL (ref 6.5–8.1)

## 2023-02-09 MED ORDER — SODIUM CHLORIDE 0.9 % IV SOLN
200.0000 mg | Freq: Once | INTRAVENOUS | Status: AC
  Administered 2023-02-09: 200 mg via INTRAVENOUS
  Filled 2023-02-09: qty 200

## 2023-02-09 MED ORDER — SODIUM CHLORIDE 0.9% FLUSH
10.0000 mL | Freq: Once | INTRAVENOUS | Status: AC
  Administered 2023-02-09: 10 mL via INTRAVENOUS
  Filled 2023-02-09: qty 10

## 2023-02-09 MED ORDER — HEPARIN SOD (PORK) LOCK FLUSH 100 UNIT/ML IV SOLN
500.0000 [IU] | Freq: Once | INTRAVENOUS | Status: AC
  Administered 2023-02-09: 500 [IU] via INTRAVENOUS
  Filled 2023-02-09: qty 5

## 2023-02-09 MED ORDER — SODIUM CHLORIDE 0.9 % IV SOLN
Freq: Once | INTRAVENOUS | Status: AC
  Filled 2023-02-09: qty 250

## 2023-02-09 NOTE — Assessment & Plan Note (Signed)
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 Recommend Tylenol PRN

## 2023-02-09 NOTE — Assessment & Plan Note (Signed)
Chronic anemia.  Hemoglobin is stable

## 2023-02-09 NOTE — Assessment & Plan Note (Signed)
Immunotherapy treatment plan as listed above.

## 2023-02-09 NOTE — Assessment & Plan Note (Signed)
Non-small cell lung cancer, favor squamous cell carcinoma, stage IV- TPS 90%, TMB 10, KRAS G12V MRI brain with and without contrast -  Left occipital focus of prior hemorrhage with mild linear contrast enhancement. Possible small metastasis or microhemmorhagic event.  TPS 90%, CT scan showed partial response. .Labs are reviewed and discussed with patient.  Proceed with Keytruda  Brain lesion, repeat MRI in May 2024 shows no brain metastatic disease.

## 2023-02-09 NOTE — Progress Notes (Signed)
Hematology/Oncology Progress note [phone] [phone]        CHIEF COMPLAINTS/PURPOSE OF CONSULTATION:  Stage IV Lung squamous cell carcinoma  ASSESSMENT & PLAN:   Cancer Staging  Malignant neoplasm of lung (HCC) Staging form: Lung, AJCC 8th Edition - Clinical: Stage IVA (cT4, cN3, cM1a) - Signed by Rickard Patience, MD on 06/28/2022  Urothelial carcinoma of bladder [facility]) Staging form: Urinary Bladder, AJCC 8th Edition - Clinical stage from 12/21/2022: Stage I (cT1, cN0, cM0) - Signed by Rickard Patience, MD on 12/29/2022   Malignant neoplasm of lung (HCC) Non-small cell lung cancer, favor squamous cell carcinoma, stage IV- TPS 90%, TMB 10, KRAS G12V MRI brain with and without contrast -  Left occipital focus of prior hemorrhage with mild linear contrast enhancement. Possible small metastasis or microhemmorhagic event.  TPS 90%, CT scan showed partial response. .Labs are reviewed and discussed with patient.  Proceed with Keytruda  Brain lesion, repeat MRI in May 2024 shows no brain metastatic disease.   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  Recommend patient to follow-up with urology, there is plan for intravesical BCG.  History of pulmonary embolism She has been off  Elqiuis 5mg  BID due to GI bleeding  Encounter for antineoplastic immunotherapy Immunotherapy treatment plan as listed above.  Normocytic anemia Chronic anemia.  Hemoglobin is stable   Arthralgia of both knees Could be related to immunotherapy. Recommend Tylenol PRN    No orders of the defined types were placed in this encounter.  Follow up in 3 weeks.   All questions were answered. The patient knows to call the clinic with any problems, questions or concerns.  Rickard Patience, MD, PhD [facility] Hematology Oncology 02/09/2023       HISTORY OF PRESENTING ILLNESS:  [facility] 82 y.o. 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 I have reviewed her chart and materials related to her cancer extensively and collaborated history with the patient. Summary of oncologic history is as follows: Oncology History  Malignant neoplasm of lung (HCC)  05/24/2022 Imaging   CT chest angiogram showed 1. No evidence for pulmonary embolism. 2. Left upper lobe/prevascular mass worrisome for neoplasm. 3. Mediastinal and hilar lymphadenopathy. 4. Multiple pulmonary nodules measuring up to 11 mm worrisome for metastatic disease. 5. Small left pleural effusion. 6. Patchy ground-glass and airspace opacities in the left upper lobe worrisome for infection. 7. Moderate-sized pericardial effusion. 8. Cholelithiasis. 9. Nonobstructing left renal calculus   05/25/2022 Imaging   CT abdomen pelvis wo contrast 1. No acute findings or explanation for the patient's symptoms. 2. No evidence of primary malignancy or metastatic disease within the abdomen or pelvis on noncontrast imaging.  3. Cholelithiasis without evidence of cholecystitis or biliary dilatation. 4. Nonobstructing left renal calculus. 5. Moderate stool throughout the colon suggesting constipation. 6.  Aortic Atherosclerosis    05/26/2022 Initial Diagnosis   Malignant neoplasm of lung - TPS 90%, TMB 10, KRAS G12V  -05/22/2022 in the emergency room, she was found to have hypoxia with oxygen levels of 86% on room air, improved to 94 with 2 L of oxygen.  D-dimer was elevated at 1.29, troponin negative.  Lower extremity Doppler was negative for DVT.  VQ scan high probability PE (Large segmental perfusion defect of the left upper lobe without corresponding radiographic abnormality. Additional bilateral wedge-shaped subsegmental branch perfusion defects .  Patient was  admitted and started on heparin Echocardiogram showed no RV strain.  Moderate pericardial effusion. Over her hospitalization,  she continues to have shortness of breath with minimal exertion. 05/24/2022, CT chest angiogram showed left lung mass with bilateral lung nodules.   06/16/2022 Imaging   PET scan  1. 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 No evidence of metastatic disease in the abdomen or pelvis. 2. Moderate pericardial effusion. 3. Tiny left pleural effusion. 4. Left renal stone. 5. Aortic atherosclerosis (ICD10-I70.0). Coronary artery calcification. 6. Enlarged pulmonic trunk, indicative of pulmonary arterial hypertension    06/21/2022 Procedure   US guided liver right supraclvicular node biopsy showed Metastatic poorly differentiated carcinoma, favor squamous cell carcinoma, probably pulmonary origin.    06/28/2022 Cancer Staging   Staging form: Lung, AJCC 8th Edition - Clinical: Stage IVA (cT4, cN3, cM1a) - Signed by Rickard Patience, MD on 06/28/2022   07/14/2022 -  Chemotherapy   Patient is on Treatment Plan : LUNG NSCLC Pembrolizumab (200) q21d     09/29/2022 Imaging   CT chest abdomen pelvis w contrast showed Bilateral pulmonary nodules, some of which are waxing/waning, but favoring multifocal lung carcinoma/metastases.   Improving thoracic nodal metastases, as above.  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 Consider cystoscopy as clinically warranted.  Aortic Atherosclerosis (ICD10-I70.0) and Emphysema (ICD10-J43.9)   01/06/2023 Imaging   CT chest abdomen pelvis w contrast showed 1. Interval decrease in size of multiple spiculated and subsolid pulmonary nodules, consistent with treatment response. 2. No significant change in matted post treatment prevascular and left superior mediastinal soft tissue. 3. Diminished size of an endoluminal  bladder mass seen on prior examination, soft tissue in this vicinity now measuring no greater than 2.1 x 1.4 cm, previously at least 4.7 x 2.1 cm. Presumably this has been at least partially resected. 4. No evidence of lymphadenopathy or metastatic disease in the abdomen or pelvis. 5. Cholelithiasis. 6. Nonobstructive left nephrolithiasis. 7. Coronary artery disease.   01/10/2023 Procedure   Medi port placed by IR   Urothelial carcinoma of bladder (HCC)  10/06/2022 Initial Diagnosis   Urothelial carcinoma of bladder S/p TURBT  1. 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 Bladder, biopsy, Base of tumor HIGH GRADE PAPILLARY UROTHELIAL CARCINOMA SUBMUCOSA AND MUSCULARIS PROPRIA IS PRESENT AND NOT INVOLVED BY CARCINOMA  Postprocedure, patient developed gross hematuria and was admitted to the hospital.  She received PRBC transfusion.  Hemoglobin improved and she was discharged.    12/21/2022 Cancer Staging   Staging form: Urinary Bladder, AJCC 8th Edition - Clinical stage from 12/21/2022: Stage I (cT1, cN0, cM0) - Signed by Rickard Patience, MD on 12/29/2022 WHO/ISUP grade (low/high): High Grade Histologic grading system: 2 grade system     INTERVAL HISTORY Regina [facility] is a 82 y.o. female who has above history reviewed by me today presents for follow up visit for Lung squamous cell carcinoma She tolerates [place] well. Denies skin rash, diarrhea.  chronic SOB with exertion, improved she has been using her inhalers.  She reports feeling well.  + leg cramps and both knees are sore   MEDICAL HISTORY:  Past Medical History:  Diagnosis Date   Atherosclerosis of  native arteries of extremity with intermittent claudication (HCC)    Bladder mass    Chronic kidney disease, stage 3a (HCC)     Diabetes mellitus without complication (HCC)    Gastrointestinal hemorrhage    Glaucoma    HTN (hypertension)    Hypertension    Hypokalemia    Hypomagnesemia    Leukocytosis    Malignant neoplasm of lung (HCC)    Malnutrition (HCC)    Normocytic anemia    Pericardial effusion    Pulmonary embolism (HCC)    Sepsis (HCC)    Type II diabetes mellitus with renal manifestations (HCC)     SURGICAL HISTORY: Past Surgical History:  Procedure Laterality Date   ABDOMINAL HYSTERECTOMY     BACK SURGERY     1988 and 1998   CYSTOSCOPY W/ RETROGRADES Bilateral 12/21/2022   Procedure: CYSTOSCOPY WITH RETROGRADE PYELOGRAM;  Surgeon: [name], MD;  Location: [facility] ORS;  Service: Urology;  Laterality: Bilateral;   ESOPHAGOGASTRODUODENOSCOPY (EGD) WITH PROPOFOL N/A 07/29/2022   Procedure: ESOPHAGOGASTRODUODENOSCOPY (EGD) WITH PROPOFOL;  Surgeon: Wyline Mood, MD;  Location: [facility] ENDOSCOPY;  Service: Gastroenterology;  Laterality: N/A;   ESOPHAGOGASTRODUODENOSCOPY (EGD) WITH PROPOFOL N/A 01/21/2023   Procedure: ESOPHAGOGASTRODUODENOSCOPY (EGD) WITH PROPOFOL;  Surgeon: Wyline Mood, MD;  Location: [facility] ENDOSCOPY;  Service: Gastroenterology;  Laterality: N/A;   IR IMAGING GUIDED PORT INSERTION  01/10/2023   TRANSURETHRAL RESECTION OF BLADDER TUMOR N/A 12/21/2022   Procedure: TRANSURETHRAL RESECTION OF BLADDER TUMOR (TURBT);  Surgeon: [name], MD;  Location: [facility] ORS;  Service: Urology;  Laterality: N/A;    SOCIAL HISTORY: Social History   Socioeconomic History   Marital status: Widowed    Spouse name: Not on file   Number of children: Not on file   Years of education: Not on file   Highest education level: Not on file  Occupational History   Not on file  Tobacco Use   Smoking status: Every Day    Current packs/day: 0.25    Types: Cigarettes   Smokeless tobacco: Never  Vaping Use   Vaping status: Never Used  Substance and Sexual Activity   Alcohol use: No   Drug use: No    Sexual activity: Not on file  Other Topics Concern   Not on file  Social History Narrative   Not on file   Social Determinants of Health   Financial Resource Strain: Low Risk  (06/28/2022)   Overall Financial Resource Strain (CARDIA)    Difficulty of Paying Living Expenses: Not very hard  Food Insecurity: No Food Insecurity (12/22/2022)   Hunger Vital Sign    Worried About Running Out of Food in the Last Year: Never true    Ran Out of Food in the Last Year: Never true  Transportation Needs: No Transportation Needs (12/22/2022)   PRAPARE - Administrator, Civil Service (Medical): No    Lack of Transportation (Non-Medical): No  Physical Activity: Not on file  Stress: No Stress Concern Present (06/28/2022)   [employer] of Occupational Health - Occupational Stress Questionnaire    Feeling of Stress : Not at all  Social Connections: Not on file  Intimate Partner Violence: Not At Risk (12/22/2022)   Humiliation, Afraid, Rape, and Kick questionnaire    Fear of Current or Ex-Partner: No    Emotionally Abused: No    Physically Abused: No    Sexually Abused: No    FAMILY HISTORY: Family History  Problem Relation Age of Onset   Asthma  Mother    Heart disease Mother    Hypertension Mother    Diabetes Mother    Stroke Mother    Diabetes Sister    Diabetes Brother     ALLERGIES:  is allergic to ivp dye [iodinated contrast media] and shellfish allergy.  MEDICATIONS:  Current Outpatient Medications  Medication Sig Dispense Refill   amLODipine (NORVASC) 10 MG tablet Take 10 mg by mouth daily.     BREZTRI AEROSPHERE 160-9-4.8 MCG/ACT AERO Inhale 1 puff into the lungs daily.     cyanocobalamin (VITAMIN B12) 1000 MCG tablet Take 1 tablet (1,000 mcg total) by mouth daily. 90 tablet 0   Fluticasone-Umeclidin-Vilant (TRELEGY ELLIPTA) 100-62.5-25 MCG/ACT AEPB Inhale into the lungs.     lidocaine-prilocaine (EMLA) cream Apply 1 Application topically as needed. Apply to port and  cover with saran wrap 1-2 hours prior to port access 30 g 1   lisinopril (ZESTRIL) 40 MG tablet Take 40 mg by mouth daily.     metFORMIN (GLUCOPHAGE) 1000 MG tablet Take 1,000 mg by mouth 2 (two) times daily with a meal.     montelukast (SINGULAIR) 10 MG tablet Take 10 mg by mouth at bedtime.     omeprazole (PRILOSEC) 40 MG capsule Take 1 capsule (40 mg total) by mouth in the morning and at bedtime. 60 capsule 2   predniSONE (DELTASONE) 50 MG tablet Take 1 tablet (50 mg total) by mouth See admin instructions. Prednisone - take 50 mg by mouth at 13 hours, 7 hours, and 1 hour before contrast media injection 3 tablet 0   trospium (SANCTURA) 20 MG tablet Take 1 tablet (20 mg total) by mouth 2 (two) times daily as needed (frequency,urgency,bladder spasm). 20 tablet 0   No current facility-administered medications for this visit.    Review of Systems  Constitutional:  Negative for appetite change, chills, fatigue and fever.  HENT:   Negative for hearing loss and voice change.   Eyes:  Negative for eye problems.  Respiratory:  Positive for shortness of breath. Negative for chest tightness and cough.   Cardiovascular:  Negative for chest pain.  Gastrointestinal:  Negative for abdominal distention, abdominal pain and blood in stool.  Endocrine: Negative for hot flashes.  Genitourinary:  Negative for difficulty urinating and frequency.   Musculoskeletal:  Negative for arthralgias.  Skin:  Negative for itching and rash.  Neurological:  Negative for extremity weakness.  Hematological:  Negative for adenopathy.  Psychiatric/Behavioral:  Negative for confusion.      PHYSICAL EXAMINATION: ECOG PERFORMANCE STATUS: 2 - Symptomatic, <50% confined to bed  Vitals:   02/09/23 0852 02/09/23 0853  BP: (!) 152/81 (!) 140/62  Pulse: (!) 101   Temp: 97.6 F (36.4 C)   SpO2: 100%    Filed Weights   02/09/23 0852  Weight: 117 lb (53.1 kg)    Physical Exam Constitutional:      General: She is not in  acute distress.    Appearance: She is not diaphoretic.  HENT:     Head: Normocephalic.  Eyes:     General: No scleral icterus.    Pupils: Pupils are equal, round, and reactive to light.  Cardiovascular:     Heart sounds: No murmur heard. Pulmonary:     Effort: Pulmonary effort is normal. No respiratory distress.     Comments: Decreased breath sounds bilaterally Abdominal:     General: There is no distension.  Musculoskeletal:        General: Normal range of motion.  Cervical back: Normal range of motion.  Skin:    Findings: No erythema.  Neurological:     Mental Status: She is alert and oriented to person, place, and time. Mental status is at baseline.     Cranial Nerves: No cranial nerve deficit.     Motor: No abnormal muscle tone.  Psychiatric:        Mood and Affect: Affect normal.      LABORATORY DATA:  I have reviewed the data as listed    Latest Ref Rng & Units 02/09/2023    8:35 AM 01/19/2023    9:25 AM 12/29/2022    8:40 AM  CBC  WBC 4.0 - 10.5 K/uL 6.2  9.1  9.1   Hemoglobin 12.0 - 15.0 g/dL 16.1  09.6  04.5   Hematocrit 36.0 - 46.0 % 36.2  37.5  36.6   Platelets 150 - 400 K/uL 265  272  295       Latest Ref Rng & Units 02/09/2023    8:35 AM 01/19/2023    9:25 AM 12/29/2022    8:40 AM  CMP  Glucose 70 - 99 mg/dL 409  811  914   BUN 8 - 23 mg/dL 27  17  12    Creatinine 0.44 - 1.00 mg/dL 7.82  9.56  2.13   Sodium 135 - 145 mmol/L 135  137  138   Potassium 3.5 - 5.1 mmol/L 3.6  3.5  3.6   Chloride 98 - 111 mmol/L 101  104  103   CO2 22 - 32 mmol/L 26  25  26    Calcium 8.9 - 10.3 mg/dL 9.2  9.0  9.0   Total Protein 6.5 - 8.1 g/dL 7.1  7.1  6.9   Total Bilirubin 0.3 - 1.2 mg/dL 0.6  0.4  0.5   Alkaline Phos 38 - 126 U/L 71  76  61   AST 15 - 41 U/L 30  33  26   ALT 0 - 44 U/L 24  27  26       RADIOGRAPHIC STUDIES: I have personally reviewed the radiological images as listed and agreed with the findings in the report. IR IMAGING GUIDED PORT  INSERTION  Result Date: 01/10/2023 INDICATION: Chemotherapy access EXAM: Chest port placement using ultrasound and fluoroscopic guidance MEDICATIONS: Documented in the EMR ANESTHESIA/SEDATION: Moderate (conscious) sedation was employed during this procedure. A total of Versed 1.5 mg and Fentanyl 75 mcg was administered intravenously. Moderate Sedation Time: 24 minutes. The patient's level of consciousness and vital signs were monitored continuously by radiology nursing throughout the procedure under my direct supervision. FLUOROSCOPY TIME:  Fluoroscopy Time: 0.4 minutes (1 mGy) COMPLICATIONS: None immediate. PROCEDURE: Informed written consent was obtained from the patient after a thorough discussion of the procedural risks, benefits and alternatives. All questions were addressed. Maximal Sterile Barrier Technique was utilized including caps, mask, sterile gowns, sterile gloves, sterile drape, hand hygiene and skin antiseptic. A timeout was performed prior to the initiation of the procedure. The patient was placed supine on the exam table. The right neck and chest was prepped and draped in the standard sterile fashion. A preliminary ultrasound of the right neck was performed and demonstrates a patent right internal jugular vein. A permanent ultrasound image was stored in the electronic medical record. The overlying skin was anesthetized with 1% Lidocaine. Using ultrasound guidance, access was obtained into the right internal jugular vein using a 21 gauge micropuncture set. A wire was advanced into the SVC, a  short incision was made at the puncture site, and serial dilatation performed. Next, in an ipsilateral infraclavicular location, an incision was made at the site of the subcutaneous reservoir. Blunt dissection was used to open a pocket to contain the reservoir. A subcutaneous tunnel was then created from the port site to the puncture site. A(n) 8 Fr single lumen catheter was advanced through the tunnel. The  catheter was attached to the port and this was placed in the subcutaneous pocket. Under fluoroscopic guidance, a peel away sheath was placed, and the catheter was trimmed to the appropriate length and was advanced into the central veins. The tip of the catheter lies near the superior cavoatrial junction. The port flushes and aspirates appropriately. The port was flushed and locked with heparinized saline. The port pocket was closed in 2 layers using 3-0 and 4-0 Vicryl/absorbable suture. Dermabond was also applied to both incisions. The patient tolerated the procedure well and was transferred to recovery in stable condition. IMPRESSION: Successful placement of a right-sided chest port via the right internal jugular vein. The port is ready for immediate use. Electronically Signed   By: Olive Bass M.D.   On: 01/10/2023 15:03

## 2023-02-09 NOTE — Progress Notes (Signed)
Started last night for the first time, her legs started feeling like they were cramped up. She states that they are not doing it this morning but her legs are sore.

## 2023-02-09 NOTE — Patient Instructions (Signed)
[facility]  Discharge Instructions: Thank you for choosing [facility] to provide your oncology and hematology care.  If you have a lab appointment with the Cancer Center, please go directly to the Cancer Center and check in at the registration area.  Wear comfortable clothing and clothing appropriate for easy access to any Portacath or PICC line.   We strive to give you quality time with your provider. You may need to reschedule your appointment if you arrive late (15 or more minutes).  Arriving late affects you and other patients whose appointments are after yours.  Also, if you miss three or more appointments without notifying the office, you may be dismissed from the clinic at the provider's discretion.      For prescription refill requests, have your pharmacy contact our office and allow 72 hours for refills to be completed.     To help prevent nausea and vomiting after your treatment, we encourage you to take your nausea medication as directed.  BELOW ARE SYMPTOMS THAT SHOULD BE REPORTED IMMEDIATELY: *FEVER GREATER THAN 100.4 F (38 C) OR HIGHER *CHILLS OR SWEATING *NAUSEA AND VOMITING THAT IS NOT CONTROLLED WITH YOUR NAUSEA MEDICATION *UNUSUAL SHORTNESS OF BREATH *UNUSUAL BRUISING OR BLEEDING *URINARY PROBLEMS (pain or burning when urinating, or frequent urination) *BOWEL PROBLEMS (unusual diarrhea, constipation, pain near the anus) TENDERNESS IN MOUTH AND THROAT WITH OR WITHOUT PRESENCE OF ULCERS (sore throat, sores in mouth, or a toothache) UNUSUAL RASH, SWELLING OR PAIN  UNUSUAL VAGINAL DISCHARGE OR ITCHING   Items with * indicate a potential emergency and should be followed up as soon as possible or go to the Emergency Department if any problems should occur.  Please show the CHEMOTHERAPY ALERT CARD or IMMUNOTHERAPY ALERT CARD at check-in to the Emergency Department and triage nurse.  Should you have questions after your visit  or need to cancel or reschedule your appointment, please contact [place] [facility]  [phone] and follow the prompts.  Office hours are 8:00 a.m. to 4:30 p.m. Monday - Friday. Please note that voicemails left after 4:00 p.m. may not be returned until the following business day.  We are closed weekends and major holidays. You have access to a nurse at all times for urgent questions. Please call the main number to the clinic [phone] and follow the prompts.  For any non-urgent questions, you may also contact your provider using MyChart. We now offer e-Visits for anyone 61 and older to request care online for non-urgent symptoms. For details visit [email or web address].   Also download the MyChart app! Go to the app store, search "MyChart", open the app, select [name], and log in with your MyChart username and password.

## 2023-02-09 NOTE — Assessment & Plan Note (Signed)
Non-muscle invasive bladder cancer. S/p TURBT, T1 lesion, high grade.  Recommend patient to follow-up with urology, there is plan for intravesical BCG.

## 2023-02-09 NOTE — Assessment & Plan Note (Signed)
She has been off  Elqiuis 5mg  BID due to GI bleeding

## 2023-02-16 NOTE — Addendum Note (Signed)
Encounter addended by: Edward Qualia on: 02/16/2023 4:11 PM  Actions taken: Imaging Exam ended

## 2023-03-02 ENCOUNTER — Inpatient Hospital Stay: Payer: Medicare Other

## 2023-03-02 ENCOUNTER — Encounter: Payer: Self-pay | Admitting: Oncology

## 2023-03-02 ENCOUNTER — Inpatient Hospital Stay (HOSPITAL_BASED_OUTPATIENT_CLINIC_OR_DEPARTMENT_OTHER): Payer: Medicare Other | Admitting: Oncology

## 2023-03-02 ENCOUNTER — Other Ambulatory Visit: Payer: Self-pay

## 2023-03-02 ENCOUNTER — Inpatient Hospital Stay: Payer: Medicare Other | Attending: Oncology

## 2023-03-02 VITALS — BP 157/69 | HR 76 | Temp 97.5°F | Resp 18 | Wt 116.0 lb

## 2023-03-02 DIAGNOSIS — Z86711 Personal history of pulmonary embolism: Secondary | ICD-10-CM | POA: Insufficient documentation

## 2023-03-02 DIAGNOSIS — C3482 Malignant neoplasm of overlapping sites of left bronchus and lung: Secondary | ICD-10-CM | POA: Diagnosis not present

## 2023-03-02 DIAGNOSIS — Z5112 Encounter for antineoplastic immunotherapy: Secondary | ICD-10-CM

## 2023-03-02 DIAGNOSIS — C679 Malignant neoplasm of bladder, unspecified: Secondary | ICD-10-CM | POA: Diagnosis not present

## 2023-03-02 DIAGNOSIS — F17210 Nicotine dependence, cigarettes, uncomplicated: Secondary | ICD-10-CM | POA: Diagnosis not present

## 2023-03-02 DIAGNOSIS — D649 Anemia, unspecified: Secondary | ICD-10-CM

## 2023-03-02 DIAGNOSIS — C787 Secondary malignant neoplasm of liver and intrahepatic bile duct: Secondary | ICD-10-CM | POA: Diagnosis present

## 2023-03-02 DIAGNOSIS — Z79899 Other long term (current) drug therapy: Secondary | ICD-10-CM | POA: Diagnosis not present

## 2023-03-02 DIAGNOSIS — E876 Hypokalemia: Secondary | ICD-10-CM | POA: Insufficient documentation

## 2023-03-02 DIAGNOSIS — C771 Secondary and unspecified malignant neoplasm of intrathoracic lymph nodes: Secondary | ICD-10-CM | POA: Diagnosis not present

## 2023-03-02 DIAGNOSIS — C3412 Malignant neoplasm of upper lobe, left bronchus or lung: Secondary | ICD-10-CM | POA: Insufficient documentation

## 2023-03-02 LAB — IRON AND TIBC
Iron: 65 ug/dL (ref 28–170)
Saturation Ratios: 18 % (ref 10.4–31.8)
TIBC: 353 ug/dL (ref 250–450)
UIBC: 288 ug/dL

## 2023-03-02 LAB — CMP (CANCER CENTER ONLY)
ALT: 20 U/L (ref 0–44)
AST: 28 U/L (ref 15–41)
Albumin: 3.7 g/dL (ref 3.5–5.0)
Alkaline Phosphatase: 63 U/L (ref 38–126)
Anion gap: 7 (ref 5–15)
BUN: 23 mg/dL (ref 8–23)
CO2: 27 mmol/L (ref 22–32)
Calcium: 9 mg/dL (ref 8.9–10.3)
Chloride: 102 mmol/L (ref 98–111)
Creatinine: 0.95 mg/dL (ref 0.44–1.00)
GFR, Estimated: 60 mL/min — ABNORMAL LOW (ref >60)
Glucose, Bld: 130 mg/dL — ABNORMAL HIGH (ref 70–99)
Potassium: 3.3 mmol/L — ABNORMAL LOW (ref 3.5–5.1)
Sodium: 136 mmol/L (ref 135–145)
Total Bilirubin: 0.5 mg/dL (ref <1.2)
Total Protein: 6.8 g/dL (ref 6.5–8.1)

## 2023-03-02 LAB — CBC WITH DIFFERENTIAL (CANCER CENTER ONLY)
Abs Immature Granulocytes: 0.02 10*3/uL (ref 0.00–0.07)
Basophils Absolute: 0 10*3/uL (ref 0.0–0.1)
Basophils Relative: 0 %
Eosinophils Absolute: 0.2 10*3/uL (ref 0.0–0.5)
Eosinophils Relative: 3 %
HCT: 33.6 % — ABNORMAL LOW (ref 36.0–46.0)
Hemoglobin: 10.9 g/dL — ABNORMAL LOW (ref 12.0–15.0)
Immature Granulocytes: 0 %
Lymphocytes Relative: 22 %
Lymphs Abs: 1.3 10*3/uL (ref 0.7–4.0)
MCH: 28.8 pg (ref 26.0–34.0)
MCHC: 32.4 g/dL (ref 30.0–36.0)
MCV: 88.7 fL (ref 80.0–100.0)
Monocytes Absolute: 0.4 10*3/uL (ref 0.1–1.0)
Monocytes Relative: 7 %
Neutro Abs: 4 10*3/uL (ref 1.7–7.7)
Neutrophils Relative %: 68 %
Platelet Count: 227 10*3/uL (ref 150–400)
RBC: 3.79 MIL/uL — ABNORMAL LOW (ref 3.87–5.11)
RDW: 17.5 % — ABNORMAL HIGH (ref 11.5–15.5)
WBC Count: 5.9 10*3/uL (ref 4.0–10.5)
nRBC: 0 % (ref 0.0–0.2)

## 2023-03-02 LAB — RETIC PANEL
Immature Retic Fract: 8.8 % (ref 2.3–15.9)
RBC.: 3.77 MIL/uL — ABNORMAL LOW (ref 3.87–5.11)
Retic Count, Absolute: 58.4 10*3/uL (ref 19.0–186.0)
Retic Ct Pct: 1.6 % (ref 0.4–3.1)
Reticulocyte Hemoglobin: 30.9 pg (ref >27.9)

## 2023-03-02 LAB — FERRITIN: Ferritin: 39 ng/mL (ref 11–307)

## 2023-03-02 LAB — TSH: TSH: 1.824 u[IU]/mL (ref 0.350–4.500)

## 2023-03-02 MED ORDER — HEPARIN SOD (PORK) LOCK FLUSH 100 UNIT/ML IV SOLN
500.0000 [IU] | Freq: Once | INTRAVENOUS | Status: AC | PRN
Start: 2023-03-02 — End: 2023-03-02
  Administered 2023-03-02: 500 [IU]
  Filled 2023-03-02: qty 5

## 2023-03-02 MED ORDER — POTASSIUM CHLORIDE CRYS ER 20 MEQ PO TBCR: 20.0000 meq | EXTENDED_RELEASE_TABLET | Freq: Every day | ORAL | 0 refills | Status: DC

## 2023-03-02 MED ORDER — SODIUM CHLORIDE 0.9 % IV SOLN
Freq: Once | INTRAVENOUS | Status: AC
  Filled 2023-03-02: qty 250

## 2023-03-02 MED ORDER — SODIUM CHLORIDE 0.9 % IV SOLN
200.0000 mg | Freq: Once | INTRAVENOUS | Status: AC
  Administered 2023-03-02: 200 mg via INTRAVENOUS
  Filled 2023-03-02: qty 8

## 2023-03-02 NOTE — Assessment & Plan Note (Signed)
Non-muscle invasive bladder cancer. S/p TURBT, T1 lesion, high grade.  Recommend patient to follow-up with urology, there is plan for intravesical BCG.

## 2023-03-02 NOTE — Progress Notes (Signed)
Hematology/Oncology Progress note [phone] [phone]       REASON OF VISIT Stage IV Lung squamous cell carcinoma  ASSESSMENT & PLAN:   Cancer Staging  Malignant neoplasm of lung (HCC) Staging form: Lung, AJCC 8th Edition - Clinical: Stage IVA (cT4, cN3, cM1a) - Signed by Rickard Patience, MD on 06/28/2022  Urothelial carcinoma of bladder [facility]) Staging form: Urinary Bladder, AJCC 8th Edition - Clinical stage from 12/21/2022: Stage I (cT1, cN0, cM0) - Signed by Rickard Patience, MD on 12/29/2022   Malignant neoplasm of lung (HCC) Non-small cell lung cancer, favor squamous cell carcinoma, stage IV- TPS 90%, TMB 10, KRAS G12V MRI brain with and without contrast -  Left occipital focus of prior hemorrhage with mild linear contrast enhancement. Possible small metastasis or microhemmorhagic event.  TPS 90%, CT scan showed partial response. .Labs are reviewed and discussed with patient.  Proceed with Keytruda  Brain lesion, repeat MRI in May 2024 shows no brain metastatic disease.   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  Recommend patient to follow-up with urology, there is plan for intravesical BCG.  History of pulmonary embolism She has been off  Elqiuis 5mg  BID due to GI bleeding  Encounter for antineoplastic immunotherapy Immunotherapy treatment plan as listed above.  Normocytic anemia Chronic anemia.  Hemoglobin is stable   Hypokalemia Recommend potassium chloride daily x 1 week. Rx sent.      No orders of the defined types were placed in this encounter.  Follow up in 3 weeks.   All questions were answered. The patient knows to call the clinic with any problems, questions or concerns.  Rickard Patience, MD, PhD [facility] Hematology Oncology 03/02/2023       HISTORY OF PRESENTING ILLNESS:  Regina Perkins 82 y.o. 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 I have reviewed her chart and materials related to her cancer extensively and collaborated history with the patient. Summary of oncologic history is as follows: Oncology History  Malignant neoplasm of lung (HCC)  05/24/2022 Imaging   CT chest angiogram showed 1. No evidence for pulmonary embolism. 2. Left upper lobe/prevascular mass worrisome for neoplasm. 3. Mediastinal and hilar lymphadenopathy. 4. Multiple pulmonary nodules measuring up to 11 mm worrisome for metastatic disease. 5. Small left pleural effusion. 6. Patchy ground-glass and airspace opacities in the left upper lobe worrisome for infection. 7. Moderate-sized pericardial effusion. 8. Cholelithiasis. 9. Nonobstructing left renal calculus   05/25/2022 Imaging   CT abdomen pelvis wo contrast 1. No acute findings or explanation for the patient's symptoms. 2. No evidence of primary malignancy or metastatic disease within the abdomen or pelvis on noncontrast imaging.  3. Cholelithiasis without evidence of cholecystitis or biliary dilatation. 4. Nonobstructing left renal calculus. 5. Moderate stool throughout the colon suggesting constipation. 6.  Aortic Atherosclerosis    05/26/2022 Initial Diagnosis   Malignant neoplasm of lung - TPS 90%, TMB 10, KRAS G12V  -05/22/2022 in the emergency room, she was found to have hypoxia with oxygen levels of 86% on room air, improved to 94 with 2 L of oxygen.  D-dimer was elevated at 1.29, troponin negative.  Lower extremity Doppler was negative for DVT.  VQ scan high probability PE (Large segmental perfusion defect of the left upper lobe without corresponding radiographic abnormality. Additional bilateral wedge-shaped subsegmental branch perfusion defects .  Patient was admitted and  started on heparin Echocardiogram showed no RV strain.  Moderate pericardial effusion. Over her hospitalization, she continues to have  shortness of breath with minimal exertion. 05/24/2022, CT chest angiogram showed left lung mass with bilateral lung nodules.   06/16/2022 Imaging   PET scan  1. 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 No evidence of metastatic disease in the abdomen or pelvis. 2. Moderate pericardial effusion. 3. Tiny left pleural effusion. 4. Left renal stone. 5. Aortic atherosclerosis (ICD10-I70.0). Coronary artery calcification. 6. Enlarged pulmonic trunk, indicative of pulmonary arterial hypertension    06/21/2022 Procedure   US guided liver right supraclvicular node biopsy showed Metastatic poorly differentiated carcinoma, favor squamous cell carcinoma, probably pulmonary origin.    06/28/2022 Cancer Staging   Staging form: Lung, AJCC 8th Edition - Clinical: Stage IVA (cT4, cN3, cM1a) - Signed by Rickard Patience, MD on 06/28/2022   07/14/2022 -  Chemotherapy   Patient is on Treatment Plan : LUNG NSCLC Pembrolizumab (200) q21d     09/29/2022 Imaging   CT chest abdomen pelvis w contrast showed Bilateral pulmonary nodules, some of which are waxing/waning, but favoring multifocal lung carcinoma/metastases.   Improving thoracic nodal metastases, as above.  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 Consider cystoscopy as clinically warranted.  Aortic Atherosclerosis (ICD10-I70.0) and Emphysema (ICD10-J43.9)   01/06/2023 Imaging   CT chest abdomen pelvis w contrast showed 1. Interval decrease in size of multiple spiculated and subsolid pulmonary nodules, consistent with treatment response. 2. No significant change in matted post treatment prevascular and left superior mediastinal soft tissue. 3. Diminished size of an endoluminal bladder mass seen on  prior examination, soft tissue in this vicinity now measuring no greater than 2.1 x 1.4 cm, previously at least 4.7 x 2.1 cm. Presumably this has been at least partially resected. 4. No evidence of lymphadenopathy or metastatic disease in the abdomen or pelvis. 5. Cholelithiasis. 6. Nonobstructive left nephrolithiasis. 7. Coronary artery disease.   01/10/2023 Procedure   Medi port placed by IR   Urothelial carcinoma of bladder (HCC)  10/06/2022 Initial Diagnosis   Urothelial carcinoma of bladder S/p TURBT  1. 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 Bladder, biopsy, Base of tumor HIGH GRADE PAPILLARY UROTHELIAL CARCINOMA SUBMUCOSA AND MUSCULARIS PROPRIA IS PRESENT AND NOT INVOLVED BY CARCINOMA  Postprocedure, patient developed gross hematuria and was admitted to the hospital.  She received PRBC transfusion.  Hemoglobin improved and she was discharged.    12/21/2022 Cancer Staging   Staging form: Urinary Bladder, AJCC 8th Edition - Clinical stage from 12/21/2022: Stage I (cT1, cN0, cM0) - Signed by Rickard Patience, MD on 12/29/2022 WHO/ISUP grade (low/high): High Grade Histologic grading system: 2 grade system     INTERVAL HISTORY Regina Perkins is a 82 y.o. female who has above history reviewed by me today presents for follow up visit for Lung squamous cell carcinoma She tolerates [place] well. Denies skin rash, diarrhea.  chronic SOB with exertion, improved she has been using her inhalers.  She reports feeling well.  + leg cramps better.    MEDICAL HISTORY:  Past Medical History:  Diagnosis Date   Atherosclerosis of native arteries of extremity with  intermittent claudication (HCC)    Bladder mass    Chronic kidney disease, stage 3a (HCC)    Diabetes mellitus without complication  (HCC)    Gastrointestinal hemorrhage    Glaucoma    HTN (hypertension)    Hypertension    Hypokalemia    Hypomagnesemia    Leukocytosis    Malignant neoplasm of lung (HCC)    Malnutrition (HCC)    Normocytic anemia    Pericardial effusion    Pulmonary embolism (HCC)    Sepsis (HCC)    Type II diabetes mellitus with renal manifestations (HCC)     SURGICAL HISTORY: Past Surgical History:  Procedure Laterality Date   ABDOMINAL HYSTERECTOMY     BACK SURGERY     1988 and 1998   CYSTOSCOPY W/ RETROGRADES Bilateral 12/21/2022   Procedure: CYSTOSCOPY WITH RETROGRADE PYELOGRAM;  Surgeon: [name], MD;  Location: [facility] ORS;  Service: Urology;  Laterality: Bilateral;   ESOPHAGOGASTRODUODENOSCOPY (EGD) WITH PROPOFOL N/A 07/29/2022   Procedure: ESOPHAGOGASTRODUODENOSCOPY (EGD) WITH PROPOFOL;  Surgeon: Wyline Mood, MD;  Location: [facility] ENDOSCOPY;  Service: Gastroenterology;  Laterality: N/A;   ESOPHAGOGASTRODUODENOSCOPY (EGD) WITH PROPOFOL N/A 01/21/2023   Procedure: ESOPHAGOGASTRODUODENOSCOPY (EGD) WITH PROPOFOL;  Surgeon: Wyline Mood, MD;  Location: [facility] END[facility]COPY;  Service: Gastroenterology;  Laterality: N/A;   IR IMAGING GUIDED PORT INSERTION  01/10/2023   TRANSURETHRAL RESECTION OF BLADDER TUMOR N/A 12/21/2022   Procedure: TRANSURETHRAL RESECTION OF BLADDER TUMOR (TURBT);  Surgeon: [name], MD;  Location: [facility] ORS;  Service: Urology;  Laterality: N/A;    SOCIAL HISTORY: Social History   Socioeconomic History   Marital status: Widowed    Spouse name: Not on file   Number of children: Not on file   Years of education: Not on file   Highest education level: Not on file  Occupational History   Not on file  Tobacco Use   Smoking status: Every Day    Current packs/day: 0.25    Types: Cigarettes   Smokeless tobacco: Never  Vaping Use   Vaping status: Never Used  Substance and Sexual Activity   Alcohol use: No   Drug use: No   Sexual activity: Not on file  Other Topics  Concern   Not on file  Social History Narrative   Not on file   Social Determinants of Health   Financial Resource Strain: Low Risk  (06/28/2022)   Overall Financial Resource Strain (CARDIA)    Difficulty of Paying Living Expenses: Not very hard  Food Insecurity: No Food Insecurity (12/22/2022)   Hunger Vital Sign    Worried About Running Out of Food in the Last Year: Never true    Ran Out of Food in the Last Year: Never true  Transportation Needs: No Transportation Needs (12/22/2022)   PRAPARE - Administrator, Civil Service (Medical): No    Lack of Transportation (Non-Medical): No  Physical Activity: Not on file  Stress: No Stress Concern Present (06/28/2022)   [employer] of Occupational Health - Occupational Stress Questionnaire    Feeling of Stress : Not at all  Social Connections: Not on file  Intimate Partner Violence: Not At Risk (12/22/2022)   Humiliation, Afraid, Rape, and Kick questionnaire    Fear of Current or Ex-Partner: No    Emotionally Abused: No    Physically Abused: No    Sexually Abused: No    FAMILY HISTORY: Family History  Problem Relation Age of Onset   Asthma Mother    Heart  disease Mother    Hypertension Mother    Diabetes Mother    Stroke Mother    Diabetes Sister    Diabetes Brother     ALLERGIES:  is allergic to ivp dye [iodinated contrast media] and shellfish allergy.  MEDICATIONS:  Current Outpatient Medications  Medication Sig Dispense Refill   amLODipine (NORVASC) 10 MG tablet Take 10 mg by mouth daily.     BREZTRI AEROSPHERE 160-9-4.8 MCG/ACT AERO Inhale 1 puff into the lungs daily.     cyanocobalamin (VITAMIN B12) 1000 MCG tablet Take 1 tablet (1,000 mcg total) by mouth daily. 90 tablet 0   Fluticasone-Umeclidin-Vilant (TRELEGY ELLIPTA) 100-62.5-25 MCG/ACT AEPB Inhale into the lungs.     lidocaine-prilocaine (EMLA) cream Apply 1 Application topically as needed. Apply to port and cover with saran wrap 1-2 hours prior to  port access 30 g 1   lisinopril (ZESTRIL) 40 MG tablet Take 40 mg by mouth daily.     metFORMIN (GLUCOPHAGE) 1000 MG tablet Take 1,000 mg by mouth 2 (two) times daily with a meal.     montelukast (SINGULAIR) 10 MG tablet Take 10 mg by mouth at bedtime.     omeprazole (PRILOSEC) 40 MG capsule Take 1 capsule (40 mg total) by mouth in the morning and at bedtime. 60 capsule 2   predniSONE (DELTASONE) 50 MG tablet Take 1 tablet (50 mg total) by mouth See admin instructions. Prednisone - take 50 mg by mouth at 13 hours, 7 hours, and 1 hour before contrast media injection 3 tablet 0   trospium (SANCTURA) 20 MG tablet Take 1 tablet (20 mg total) by mouth 2 (two) times daily as needed (frequency,urgency,bladder spasm). 20 tablet 0   No current facility-administered medications for this visit.    Review of Systems  Constitutional:  Negative for appetite change, chills, fatigue and fever.  HENT:   Negative for hearing loss and voice change.   Eyes:  Negative for eye problems.  Respiratory:  Positive for shortness of breath. Negative for chest tightness and cough.   Cardiovascular:  Negative for chest pain.  Gastrointestinal:  Negative for abdominal distention, abdominal pain and blood in stool.  Endocrine: Negative for hot flashes.  Genitourinary:  Negative for difficulty urinating and frequency.   Musculoskeletal:  Negative for arthralgias.  Skin:  Negative for itching and rash.  Neurological:  Negative for extremity weakness.  Hematological:  Negative for adenopathy.  Psychiatric/Behavioral:  Negative for confusion.      PHYSICAL EXAMINATION: ECOG PERFORMANCE STATUS: 2 - Symptomatic, <50% confined to bed  There were no vitals filed for this visit.  There were no vitals filed for this visit.   Physical Exam Constitutional:      General: She is not in acute distress.    Appearance: She is not diaphoretic.  HENT:     Head: Normocephalic.  Eyes:     General: No scleral icterus.     Pupils: Pupils are equal, round, and reactive to light.  Cardiovascular:     Heart sounds: No murmur heard. Pulmonary:     Effort: Pulmonary effort is normal. No respiratory distress.     Comments: Decreased breath sounds bilaterally Abdominal:     General: There is no distension.  Musculoskeletal:        General: Normal range of motion.     Cervical back: Normal range of motion.  Skin:    Findings: No erythema.  Neurological:     Mental Status: She is alert and oriented to  person, place, and time. Mental status is at baseline.     Cranial Nerves: No cranial nerve deficit.     Motor: No abnormal muscle tone.  Psychiatric:        Mood and Affect: Affect normal.      LABORATORY DATA:  I have reviewed the data as listed    Latest Ref Rng & Units 03/02/2023    8:12 AM 02/09/2023    8:35 AM 01/19/2023    9:25 AM  CBC  WBC 4.0 - 10.5 K/uL 5.9  6.2  9.1   Hemoglobin 12.0 - 15.0 g/dL 40.9  81.1  91.4   Hematocrit 36.0 - 46.0 % 33.6  36.2  37.5   Platelets 150 - 400 K/uL 227  265  272       Latest Ref Rng & Units 02/09/2023    8:35 AM 01/19/2023    9:25 AM 12/29/2022    8:40 AM  CMP  Glucose 70 - 99 mg/dL 782  956  213   BUN 8 - 23 mg/dL 27  17  12    Creatinine 0.44 - 1.00 mg/dL 0.86  5.78  4.69   Sodium 135 - 145 mmol/L 135  137  138   Potassium 3.5 - 5.1 mmol/L 3.6  3.5  3.6   Chloride 98 - 111 mmol/L 101  104  103   CO2 22 - 32 mmol/L 26  25  26    Calcium 8.9 - 10.3 mg/dL 9.2  9.0  9.0   Total Protein 6.5 - 8.1 g/dL 7.1  7.1  6.9   Total Bilirubin 0.3 - 1.2 mg/dL 0.6  0.4  0.5   Alkaline Phos 38 - 126 U/L 71  76  61   AST 15 - 41 U/L 30  33  26   ALT 0 - 44 U/L 24  27  26       RADIOGRAPHIC STUDIES: I have personally reviewed the radiological images as listed and agreed with the findings in the report. No results found.

## 2023-03-02 NOTE — Assessment & Plan Note (Signed)
 Chronic anemia.  Hemoglobin is stable

## 2023-03-02 NOTE — Assessment & Plan Note (Signed)
Immunotherapy treatment plan as listed above. 

## 2023-03-02 NOTE — Patient Instructions (Signed)
[facility] - A DEPT OF [facility]  Discharge Instructions: Thank you for choosing [facility] to provide your oncology and hematology care.  If you have a lab appointment with the Cancer Center, please go directly to the Cancer Center and check in at the registration area.  Wear comfortable clothing and clothing appropriate for easy access to any Portacath or PICC line.   We strive to give you quality time with your provider. You may need to reschedule your appointment if you arrive late (15 or more minutes).  Arriving late affects you and other patients whose appointments are after yours.  Also, if you miss three or more appointments without notifying the office, you may be dismissed from the clinic at the provider's discretion.      For prescription refill requests, have your pharmacy contact our office and allow 72 hours for refills to be completed.    Today you received the following chemotherapy and/or immunotherapy agents Keytruda      To help prevent nausea and vomiting after your treatment, we encourage you to take your nausea medication as directed.  BELOW ARE SYMPTOMS THAT SHOULD BE REPORTED IMMEDIATELY: *FEVER GREATER THAN 100.4 F (38 C) OR HIGHER *CHILLS OR SWEATING *NAUSEA AND VOMITING THAT IS NOT CONTROLLED WITH YOUR NAUSEA MEDICATION *UNUSUAL SHORTNESS OF BREATH *UNUSUAL BRUISING OR BLEEDING *URINARY PROBLEMS (pain or burning when urinating, or frequent urination) *BOWEL PROBLEMS (unusual diarrhea, constipation, pain near the anus) TENDERNESS IN MOUTH AND THROAT WITH OR WITHOUT PRESENCE OF ULCERS (sore throat, sores in mouth, or a toothache) UNUSUAL RASH, SWELLING OR PAIN  UNUSUAL VAGINAL DISCHARGE OR ITCHING   Items with * indicate a potential emergency and should be followed up as soon as possible or go to the Emergency Department if any problems should occur.  Please show the CHEMOTHERAPY ALERT CARD or IMMUNOTHERAPY  ALERT CARD at check-in to the Emergency Department and triage nurse.  Should you have questions after your visit or need to cancel or reschedule your appointment, please contact [facility] - A DEPT OF [facility]  [phone] and follow the prompts.  Office hours are 8:00 a.m. to 4:30 p.m. Monday - Friday. Please note that voicemails left after 4:00 p.m. may not be returned until the following business day.  We are closed weekends and major holidays. You have access to a nurse at all times for urgent questions. Please call the main number to the clinic [phone] and follow the prompts.  For any non-urgent questions, you may also contact your provider using MyChart. We now offer e-Visits for anyone 83 and older to request care online for non-urgent symptoms. For details visit [email or web address].   Also download the MyChart app! Go to the app store, search "MyChart", open the app, select , and log in with your MyChart username and password.

## 2023-03-02 NOTE — Progress Notes (Signed)
Nutrition Follow-up:  Patient with stage IV lung cancer.  S/p TURBT for urothelial carcinoma of bladder, planning intravesical BCG. Patient receiving keytruda  Met with patient during infusion.  Patient eating cookies during treatment.  Reports that she is eating well.  Drinking 3 ensure plus shakes a day.  Last night ate hamburger with mashed potatoes and green beans.  Denies nutrition impact symptoms    Medications: reviewed  Labs: K 3.3  Anthropometrics:   Weight 116 lb today  114 lb 14.4 oz on 9/25 116 lb 6.4 oz on 8/14 115 lb on 6/12 118 lb on 5/1 116 lb on 4/9 115 lb on 3/15  NUTRITION DIAGNOSIS: Inadequate oral intake improved    INTERVENTION:  Continue high calorie, high protein foods Continue 350 calorie shake TID.  Encouraged snacking with drinking shake    MONITORING, EVALUATION, GOAL: weight trend, intake   NEXT VISIT: Wednesday, Dec 18 during infusion  Mayte Diers B. Freida Busman, RD, LDN Registered Dietitian [phone]

## 2023-03-02 NOTE — Assessment & Plan Note (Signed)
Recommend potassium chloride daily x 1 week. Rx sent.

## 2023-03-02 NOTE — Assessment & Plan Note (Addendum)
Non-small cell lung cancer, favor squamous cell carcinoma, stage IV- TPS 90%, TMB 10, KRAS G12V MRI brain with and without contrast -  Left occipital focus of prior hemorrhage with mild linear contrast enhancement. Possible small metastasis or microhemmorhagic event.  TPS 90%, CT scan showed partial response. .Labs are reviewed and discussed with patient.  Proceed with Keytruda  Brain lesion, repeat MRI in May 2024 shows no brain metastatic disease.

## 2023-03-02 NOTE — Assessment & Plan Note (Signed)
She has been off  Elqiuis 5mg  BID due to GI bleeding

## 2023-03-03 LAB — T4: T4, Total: 8.1 ug/dL (ref 4.5–12.0)

## 2023-03-04 ENCOUNTER — Ambulatory Visit (INDEPENDENT_AMBULATORY_CARE_PROVIDER_SITE_OTHER): Payer: Medicare Other | Admitting: Urology

## 2023-03-04 ENCOUNTER — Encounter: Payer: Self-pay | Admitting: Urology

## 2023-03-04 VITALS — BP 202/80 | HR 85 | Ht 63.0 in | Wt 116.0 lb

## 2023-03-04 DIAGNOSIS — C679 Malignant neoplasm of bladder, unspecified: Secondary | ICD-10-CM | POA: Diagnosis not present

## 2023-03-04 NOTE — Progress Notes (Signed)
I, Maysun Anabel Bene, acting as a scribe for [name], MD., have documented all relevant documentation on the behalf of [name], MD, as directed by [name], MD while in the presence of [name], MD.  03/04/2023 4:08 PM   Regina Perkins Oct 02, 1940 [id number]  Referring provider: Center, [facility] [address] [place],  [place] [address]  Chief Complaint  Patient presents with   Other   Urologic history: 1. Bladder mass CT chest/abdomen/pelvis performed 09/29/2022 for lung cancer monitoring incidentally showed multifocal bladder masses measuring up to 4.7 cm.  HPI: Regina Perkins is a 82 y.o. female presents for a follow-up visit.  History of stage 4 non-small cell lung cancer incidentally found to have a large bladder mass on PET-CT.  TURBT of a greater than 5 cm bladder tumor 12/21/22. Was noted to have tumor involvement of the right distal ureter. 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 There was lamina propria invasion, but no definite muscle invasion. She presently has no complaints.  Denies gross hematuria.    PMH: Past Medical History:  Diagnosis Date   Atherosclerosis of native arteries of extremity with intermittent claudication (HCC)    Bladder mass    Chronic kidney disease, stage 3a (HCC)    Diabetes mellitus without complication (HCC)    Gastrointestinal hemorrhage    Glaucoma    HTN (hypertension)    Hypertension    Hypokalemia    Hypomagnesemia    Leukocytosis    Malignant neoplasm of lung (HCC)    Malnutrition (HCC)    Normocytic anemia    Pericardial effusion    Pulmonary embolism (HCC)    Sepsis (HCC)    Type II diabetes mellitus with renal manifestations (HCC)     Surgical History: Past Surgical History:  Procedure Laterality Date   ABDOMINAL HYSTERECTOMY     BACK SURGERY     1988 and 1998   CYSTOSCOPY W/ RETROGRADES Bilateral 12/21/2022    Procedure: CYSTOSCOPY WITH RETROGRADE PYELOGRAM;  Surgeon: [name], MD;  Location: [facility] ORS;  Service: Urology;  Laterality: Bilateral;   ESOPHAGOGASTRODUODENOSCOPY (EGD) WITH PROPOFOL N/A 07/29/2022   Procedure: ESOPHAGOGASTRODUODENOSCOPY (EGD) WITH PROPOFOL;  Surgeon: Wyline Mood, MD;  Location: [facility] ENDOSCOPY;  Service: Gastroenterology;  Laterality: N/A;   ESOPHAGOGASTRODUODENOSCOPY (EGD) WITH PROPOFOL N/A 01/21/2023   Procedure: ESOPHAGOGASTRODUODENOSCOPY (EGD) WITH PROPOFOL;  Surgeon: Wyline Mood, MD;  Location: [facility] ENDOSCOPY;  Service: Gastroenterology;  Laterality: N/A;   IR IMAGING GUIDED PORT INSERTION  01/10/2023   TRANSURETHRAL RESECTION OF BLADDER TUMOR N/A 12/21/2022   Procedure: TRANSURETHRAL RESECTION OF BLADDER TUMOR (TURBT);  Surgeon: [name], MD;  Location: [facility] ORS;  Service: Urology;  Laterality: N/A;    Home Medications:  Allergies as of 03/04/2023       Reactions   Ivp Dye [iodinated Contrast Media]    Shellfish Allergy Other (See Comments)   Reaction: unknown         Medication List        Accurate as of March 04, 2023  4:08 PM. If you have any questions, ask your nurse or doctor.          amLODipine 10 MG tablet Commonly known as: NORVASC Take 10 mg by mouth daily.   Breztri Aerosphere 160-9-4.8 MCG/ACT Aero Generic drug: Budeson-Glycopyrrol-Formoterol Inhale 1 puff into the lungs daily.   cyanocobalamin 1000 MCG tablet Commonly known as: VITAMIN B12 Take 1  tablet (1,000 mcg total) by mouth daily.   lidocaine-prilocaine cream Commonly known as: EMLA Apply 1 Application topically as needed. Apply to port and cover with saran wrap 1-2 hours prior to port access   lisinopril 40 MG tablet Commonly known as: ZESTRIL Take 40 mg by mouth daily.   metFORMIN 1000 MG tablet Commonly known as: GLUCOPHAGE Take 1,000 mg by mouth 2 (two) times daily with a meal.   montelukast 10 MG tablet Commonly known as: SINGULAIR Take 10 mg by  mouth at bedtime.   omeprazole 40 MG capsule Commonly known as: PRILOSEC Take 1 capsule (40 mg total) by mouth in the morning and at bedtime.   potassium chloride SA 20 MEQ tablet Commonly known as: KLOR-CON M Take 1 tablet (20 mEq total) by mouth daily.   predniSONE 50 MG tablet Commonly known as: DELTASONE Take 1 tablet (50 mg total) by mouth See admin instructions. Prednisone - take 50 mg by mouth at 13 hours, 7 hours, and 1 hour before contrast media injection   Trelegy Ellipta 100-62.5-25 MCG/ACT Aepb Generic drug: Fluticasone-Umeclidin-Vilant Inhale into the lungs.   trospium 20 MG tablet Commonly known as: SANCTURA Take 1 tablet (20 mg total) by mouth 2 (two) times daily as needed (frequency,urgency,bladder spasm).        Allergies:  Allergies  Allergen Reactions   Ivp Dye [Iodinated Contrast Media]    Shellfish Allergy Other (See Comments)    Reaction: unknown     Family History: Family History  Problem Relation Age of Onset   Asthma Mother    Heart disease Mother    Hypertension Mother    Diabetes Mother    Stroke Mother    Diabetes Sister    Diabetes Brother     Social History:  reports that she has been smoking cigarettes. She has never used smokeless tobacco. She reports that she does not drink alcohol and does not use drugs.   Physical Exam: BP (!) 202/80   Pulse 85   Ht 5\' 3"  (1.6 m)   Wt 116 lb (52.6 kg)   BMI 20.55 kg/m   Constitutional:  Alert, No acute distress. HEENT: [place] AT Respiratory: Normal respiratory effort, no increased work of breathing. Psychiatric: Normal mood and affect.   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 We discussed that approximately 30% of T1 urethelial tumors will be upstaged to muscle invasive disease on restaging and have recommended restaging TUR/biopsies. We also discussed abnormal variant pathology is not responsive to BCG and would consider  additional treatment with radiation/chemo as she is not a cystectomy candidate.  Schedule staging TUR and right ureteroscopy. Possible need for stent placement was discussed. Potential risks were discussed including bleeding, infection, bladder injury, and stent symptoms.  All questions were answered and she desires to proceed.   I have reviewed the above documentation for accuracy and completeness, and I agree with the above.   [name], MD  [facility] [address], [address] [place], [place] [address] [phone]

## 2023-03-04 NOTE — H&P (View-Only) (Signed)
I, Maysun Anabel Bene, acting as a scribe for [name], MD., have documented all relevant documentation on the behalf of [name], MD, as directed by [name], MD while in the presence of [name], MD.  03/04/2023 4:08 PM   Regina Perkins 11/27/1940 [id number]  Referring provider: [facility], [facility] [address] [place],  [place] [address]  Chief Complaint  Patient presents with   Other   Urologic history: 1. Bladder mass CT chest/abdomen/pelvis performed 09/29/2022 for lung cancer monitoring incidentally showed multifocal bladder masses measuring up to 4.7 cm.  HPI: Regina Perkins is a 82 y.o. female presents for a follow-up visit.  History of stage 4 non-small cell lung cancer incidentally found to have a large bladder mass on PET-CT.  TURBT of a greater than 5 cm bladder tumor 12/21/22. Was noted to have tumor involvement of the right distal ureter. 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 There was lamina propria invasion, but no definite muscle invasion. She presently has no complaints.  Denies gross hematuria.    PMH: Past Medical History:  Diagnosis Date   Atherosclerosis of native arteries of extremity with intermittent claudication (HCC)    Bladder mass    Chronic kidney disease, stage 3a (HCC)    Diabetes mellitus without complication (HCC)    Gastrointestinal hemorrhage    Glaucoma    HTN (hypertension)    Hypertension    Hypokalemia    Hypomagnesemia    Leukocytosis    Malignant neoplasm of lung (HCC)    Malnutrition (HCC)    Normocytic anemia    Pericardial effusion    Pulmonary embolism (HCC)    Sepsis (HCC)    Type II diabetes mellitus with renal manifestations (HCC)     Surgical History: Past Surgical History:  Procedure Laterality Date   ABDOMINAL HYSTERECTOMY     BACK SURGERY     1988 and 1998   CYSTOSCOPY W/ RETROGRADES Bilateral 12/21/2022    Procedure: CYSTOSCOPY WITH RETROGRADE PYELOGRAM;  Surgeon: [name], MD;  Location: [facility] ORS;  Service: Urology;  Laterality: Bilateral;   ESOPHAGOGASTRODUODENOSCOPY (EGD) WITH PROPOFOL N/A 07/29/2022   Procedure: ESOPHAGOGASTRODUODENOSCOPY (EGD) WITH PROPOFOL;  Surgeon: Wyline Mood, MD;  Location: [facility] ENDOSCOPY;  Service: Gastroenterology;  Laterality: N/A;   ESOPHAGOGASTRODUODENOSCOPY (EGD) WITH PROPOFOL N/A 01/21/2023   Procedure: ESOPHAGOGASTRODUODENOSCOPY (EGD) WITH PROPOFOL;  Surgeon: Wyline Mood, MD;  Location: [facility] ENDOSCOPY;  Service: Gastroenterology;  Laterality: N/A;   IR IMAGING GUIDED PORT INSERTION  01/10/2023   TRANSURETHRAL RESECTION OF BLADDER TUMOR N/A 12/21/2022   Procedure: TRANSURETHRAL RESECTION OF BLADDER TUMOR (TURBT);  Surgeon: [name], MD;  Location: [facility] ORS;  Service: Urology;  Laterality: N/A;    Home Medications:  Allergies as of 03/04/2023       Reactions   Ivp Dye [iodinated Contrast Media]    Shellfish Allergy Other (See Comments)   Reaction: unknown         Medication List        Accurate as of March 04, 2023  4:08 PM. If you have any questions, ask your nurse or doctor.          amLODipine 10 MG tablet Commonly known as: NORVASC Take 10 mg by mouth daily.   Breztri Aerosphere 160-9-4.8 MCG/ACT Aero Generic drug: Budeson-Glycopyrrol-Formoterol Inhale 1 puff into the lungs daily.   cyanocobalamin 1000 MCG tablet Commonly known as: VITAMIN B12 Take 1  tablet (1,000 mcg total) by mouth daily.   lidocaine-prilocaine cream Commonly known as: EMLA Apply 1 Application topically as needed. Apply to port and cover with saran wrap 1-2 hours prior to port access   lisinopril 40 MG tablet Commonly known as: ZESTRIL Take 40 mg by mouth daily.   metFORMIN 1000 MG tablet Commonly known as: GLUCOPHAGE Take 1,000 mg by mouth 2 (two) times daily with a meal.   montelukast 10 MG tablet Commonly known as: SINGULAIR Take 10 mg by  mouth at bedtime.   omeprazole 40 MG capsule Commonly known as: PRILOSEC Take 1 capsule (40 mg total) by mouth in the morning and at bedtime.   potassium chloride SA 20 MEQ tablet Commonly known as: KLOR-CON M Take 1 tablet (20 mEq total) by mouth daily.   predniSONE 50 MG tablet Commonly known as: DELTASONE Take 1 tablet (50 mg total) by mouth See admin instructions. Prednisone - take 50 mg by mouth at 13 hours, 7 hours, and 1 hour before contrast media injection   Trelegy Ellipta 100-62.5-25 MCG/ACT Aepb Generic drug: Fluticasone-Umeclidin-Vilant Inhale into the lungs.   trospium 20 MG tablet Commonly known as: SANCTURA Take 1 tablet (20 mg total) by mouth 2 (two) times daily as needed (frequency,urgency,bladder spasm).        Allergies:  Allergies  Allergen Reactions   Ivp Dye [Iodinated Contrast Media]    Shellfish Allergy Other (See Comments)    Reaction: unknown     Family History: Family History  Problem Relation Age of Onset   Asthma Mother    Heart disease Mother    Hypertension Mother    Diabetes Mother    Stroke Mother    Diabetes Sister    Diabetes Brother     Social History:  reports that she has been smoking cigarettes. She has never used smokeless tobacco. She reports that she does not drink alcohol and does not use drugs.   Physical Exam: BP (!) 202/80   Pulse 85   Ht 5\' 3"  (1.6 m)   Wt 116 lb (52.6 kg)   BMI 20.55 kg/m   Constitutional:  Alert, No acute distress. HEENT: [place] AT Respiratory: Normal respiratory effort, no increased work of breathing. Psychiatric: Normal mood and affect.   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 We discussed that approximately 30% of T1 urethelial tumors will be upstaged to muscle invasive disease on restaging and have recommended restaging TUR/biopsies. We also discussed abnormal variant pathology is not responsive to BCG and would consider  additional treatment with radiation/chemo as she is not a cystectomy candidate.  Schedule staging TUR and right ureteroscopy. Possible need for stent placement was discussed. Potential risks were discussed including bleeding, infection, bladder injury, and stent symptoms.  All questions were answered and she desires to proceed.   I have reviewed the above documentation for accuracy and completeness, and I agree with the above.   [name], MD  [facility] [address], [address] [place], [place] [address] [phone]

## 2023-03-06 ENCOUNTER — Encounter: Payer: Self-pay | Admitting: Urology

## 2023-03-08 ENCOUNTER — Other Ambulatory Visit: Payer: Self-pay

## 2023-03-08 DIAGNOSIS — C679 Malignant neoplasm of bladder, unspecified: Secondary | ICD-10-CM

## 2023-03-08 NOTE — Progress Notes (Signed)
Surgical Physician Order Form [facility]  Dr. Irineo Axon, MD  * Scheduling expectation : Next Available  *Length of Case: 60 min  *Clearance needed: per Judie Grieve  *Anticoagulation Instructions: N/A (off blood thinner)  *Aspirin Instructions: N/A  *Post-op visit Date/Instructions:  1-2 week with pathology review  *Diagnosis: bladder cancer  *Procedure:  Cysto Bladder Biopsy ([address]), possible TURBT; right ureteroscopy with possible biopsy/laser ablation/stent placement    Additional orders: N/A  -Admit type: OUTpatient  -Anesthesia: Choice  -VTE Prophylaxis Standing Order SCD's       Other:   -Standing Lab Orders Per Anesthesia    Lab other: UA&Urine Culture  -Standing Test orders EKG/Chest x-ray per Anesthesia       Test other:   - Medications:  Ancef 2gm IV  -Other orders:  N/A

## 2023-03-11 ENCOUNTER — Telehealth: Payer: Self-pay

## 2023-03-11 NOTE — Progress Notes (Signed)
   [facility] Surgical Posting Form  Surgery Date: Date: 03/29/2023  Surgeon: Dr. Irineo Axon, MD  Inpt ( No  )   Outpt (Yes)   Obs ( No  )   Diagnosis: C67.9 Bladder Cancer  -CPT: 25366, 44034, 74259, 56387, [id number]  Surgery: Cytsoscopy with Bladder Biopsy, Possible Transurethral Resection of Bladder Tumor, Right Ureteroscopy with possible Right Ureteral biopsy, Possible Laser Ablation and Right Stent Placement  Stop Anticoagulations: No, currently off blood thinners.   Cardiac/Medical/Pulmonary Clearance needed: no  *Orders entered into EPIC  Date: 03/11/23   *Case booked in EPIC  Date: 03/09/2023  *Notified pt of Surgery: Date: 03/09/2023  PRE-OP UA & CX: yes, will obtain in clinic on 03/18/2023  *Placed into Prior Authorization Work Angela Nevin Date: 03/11/23  Assistant/laser/rep:No

## 2023-03-11 NOTE — Telephone Encounter (Signed)
Per Dr. Lonna Cobb, Patient is to be scheduled for Cytsoscopy with Bladder Biopsy, Possible Transurethral Resection of Bladder Tumor, Right Ureteroscopy with possible Right Ureteral biopsy, Possible Laser Ablation and Right Stent Placement   Regina Perkins was contacted and possible surgical dates were discussed, Tuesday December 3rd, 2024 was agreed upon for surgery.   Patient was instructed that Dr. Lonna Cobb will require them to provide a pre-op UA & CX prior to surgery. This was ordered and scheduled drop off appointment was made for 03/18/2023.    Patient was directed to call [phone] between 1-3pm the day before surgery to find out surgical arrival time.  Instructions were given not to eat or drink from midnight on the night before surgery and have a driver for the day of surgery. On the surgery day patient was instructed to enter through the [facility] entrance of [facility] report the Same Day Surgery desk.   Pre-Admit Testing will be in contact via phone to set up an interview with the anesthesia team to review your history and medications prior to surgery.   Reminder of this information was sent via MyChart to the patient.

## 2023-03-18 ENCOUNTER — Encounter
Admission: RE | Admit: 2023-03-18 | Discharge: 2023-03-18 | Disposition: A | Discharge status: Home / Self Care | Payer: Medicare Other | Source: Ambulatory Visit | Attending: Urology | Admitting: Urology

## 2023-03-18 ENCOUNTER — Telehealth: Payer: Self-pay | Admitting: Urgent Care

## 2023-03-18 ENCOUNTER — Encounter: Payer: Self-pay | Admitting: Urgent Care

## 2023-03-18 ENCOUNTER — Other Ambulatory Visit: Payer: Medicare Other

## 2023-03-18 VITALS — BP 128/86 | HR 87 | Ht 63.0 in | Wt 116.8 lb

## 2023-03-18 DIAGNOSIS — C679 Malignant neoplasm of bladder, unspecified: Secondary | ICD-10-CM

## 2023-03-18 DIAGNOSIS — Z01810 Encounter for preprocedural cardiovascular examination: Secondary | ICD-10-CM

## 2023-03-18 DIAGNOSIS — R300 Dysuria: Secondary | ICD-10-CM | POA: Diagnosis not present

## 2023-03-18 DIAGNOSIS — E1129 Type 2 diabetes mellitus with other diabetic kidney complication: Secondary | ICD-10-CM | POA: Diagnosis not present

## 2023-03-18 DIAGNOSIS — E46 Unspecified protein-calorie malnutrition: Secondary | ICD-10-CM

## 2023-03-18 DIAGNOSIS — Z01812 Encounter for preprocedural laboratory examination: Secondary | ICD-10-CM

## 2023-03-18 DIAGNOSIS — E876 Hypokalemia: Secondary | ICD-10-CM

## 2023-03-18 DIAGNOSIS — Z01818 Encounter for other preprocedural examination: Secondary | ICD-10-CM | POA: Diagnosis present

## 2023-03-18 DIAGNOSIS — I160 Hypertensive urgency: Secondary | ICD-10-CM

## 2023-03-18 DIAGNOSIS — C3490 Malignant neoplasm of unspecified part of unspecified bronchus or lung: Secondary | ICD-10-CM

## 2023-03-18 HISTORY — DX: Tobacco use: Z72.0

## 2023-03-18 HISTORY — DX: Gastro-esophageal reflux disease without esophagitis: K21.9

## 2023-03-18 HISTORY — DX: Other nonspecific abnormal finding of lung field: R91.8

## 2023-03-18 HISTORY — DX: Personal history of other diseases of the digestive system: Z87.19

## 2023-03-18 HISTORY — DX: Chronic obstructive pulmonary disease, unspecified: J44.9

## 2023-03-18 LAB — URINALYSIS, ROUTINE W REFLEX MICROSCOPIC
Bilirubin Urine: NEGATIVE
Glucose, UA: NEGATIVE mg/dL
Hgb urine dipstick: NEGATIVE
Ketones, ur: NEGATIVE mg/dL
Nitrite: NEGATIVE
Protein, ur: NEGATIVE mg/dL
Specific Gravity, Urine: 1.016 (ref 1.005–1.030)
pH: 5 (ref 5.0–8.0)

## 2023-03-18 LAB — BASIC METABOLIC PANEL
Anion gap: 10 (ref 5–15)
BUN: 16 mg/dL (ref 8–23)
CO2: 27 mmol/L (ref 22–32)
Calcium: 9.3 mg/dL (ref 8.9–10.3)
Chloride: 102 mmol/L (ref 98–111)
Creatinine, Ser: 0.88 mg/dL (ref 0.44–1.00)
GFR, Estimated: >60 mL/min (ref >60)
Glucose, Bld: 92 mg/dL (ref 70–99)
Potassium: 3.1 mmol/L — ABNORMAL LOW (ref 3.5–5.1)
Sodium: 139 mmol/L (ref 135–145)

## 2023-03-18 LAB — MAGNESIUM: Magnesium: 1.8 mg/dL (ref 1.7–2.4)

## 2023-03-18 MED ORDER — POTASSIUM CHLORIDE ER 10 MEQ PO TBCR: 10.0000 meq | EXTENDED_RELEASE_TABLET | Freq: Every day | ORAL | 0 refills | Status: DC

## 2023-03-18 NOTE — Progress Notes (Signed)
[facility] Perioperative Services: Pre-Admission/Anesthesia Testing  Abnormal Lab Notification and Treatment Plan of Care   Date: 03/18/23  Name: Regina Perkins MRN:   [id number]  Re: Abnormal labs noted during PAT appointment   Notified:  Provider Name Provider Role Notification Mode  Irineo Axon, MD Urology (Surgeon) Routed and/or faxed via [facility], [facility] Primary Care Provider Routed and/or faxed via Gaspar Skeeters, MD Medical Oncology Routed and/or faxed via [facility]   Clinical Information and Notes:  ABNORMAL LAB VALUE(S): Lab Results  Component Value Date   K 3.1 (L) 03/18/2023   Regina Perkins is scheduled for an elective CYSTOSCOPY WITH BLADDER BIOPSY; TRANSURETHRAL RESECTION OF BLADDER TUMOR (TURBT);DIAGNOSTIC URETEROSCOPY; HOLMIUM LASER ABLATION; CYSTOSCOPY WITH STENT PLACEMENT on 03/29/2023. In review of her medication reconciliation, it is noted that the patient is not taking prescribed diuretic medications. Patient has a history of hypokalemia.   Please note, in efforts to promote a safe and effective anesthetic course, per current guidelines/standards set by the [facility] anesthesia team, the minimal acceptable K+ level for the patient to proceed with general anesthesia is 3.0 mmol/L. With that being said, if the patient drops any lower, her elective procedure will need to be postponed until K+ is better optimized. In efforts to prevent case cancellation, will make efforts to optimize pre-surgical K+ level so that patient can safely undergo the planned surgical intervention.   Impression and Plan:  Regina Perkins found to be HYPOkalemic at 3.1 mmol/L on preoperative labs.   Mg on the lower end of normal at 1.8 mg/dL.   Called patient to discuss results and plans for correction of noted electrolyte derangement.  She is not on diuretic therapy. Recently completed a short course of KCl supplementation when found to be hypokalemic  by oncology. Presume electrolyte derangement is due to poor nutritional intake, as she has a documented PMH (+) for malnutrition. Less likely, could consider paraneoplastic syndrome (malignancy related SIADH) associated with her malignancies as part of the differential. Cancer Center following patient's electrolytes closely, as patient is currently on treatment with anti-PD1 therapy (pembrolizumab) for stage IVA (cT4, cN3, cM1a) NSCLC  Reviewed plans for preoperative optimization of her K+ as follows.  Meds ordered this encounter  Medications   potassium chloride (KLOR-CON) 10 MEQ tablet    Sig: Take 1 tablet (10 mEq total) by mouth daily. Be sure to take dose on day of surgery. Follow up with PCP or oncology for repeat labs.    Dispense:  12 tablet    Refill:  0    Please contact patient when Rx is ready for pick up. Rx for preoperative K+ optimization and needs to be started ASAP.   Encouraged patient to follow up with PCP and/or oncology in about 2-3 weeks postoperatively to have labs rechecked to ensure that levels are remaining within normal range. Discussed nutritional intake of K+ rich foods as an adjunctive way to keep her K+ levels normal; list of K+ rich foods provided. Also mentioned ORS, however advised her not to rely solely on these drinks, as they are high in Na+, and she has a HTN diagnosis.   Will send copy of this note to surgeon, PCP, and oncology to make them aware of K+ level and plans for correction. Discussed that provider may elect to add a daily K+ supplement if levels remain low on recheck, as this seems to be becoming a chronic issue. Order entered to recheck K+  on the day of her surgery to ensure optimization. Wished patient the best of luck with her upcoming surgery and subsequent recovery. She was encouraged to return call to the PAT clinic, or to her surgeon's office, should any questions or concerns arise between now and the time of her surgery. Patient was appreciative  of the care/concern expressed by PAT staff.   Encounter Diagnoses  Name Primary?   Pre-operative laboratory examination Yes   Hypokalemia    Malnutrition (HCC)    Non-small cell lung cancer (NSCLC) (HCC)    Quentin Mulling, MSN, APRN, FNP-C, CEN [facility] Nurse Practitioner Phone: [phone] 03/18/23 2:52 PM  NOTE: This note has been prepared using Dragon dictation software. Despite my best ability to proofread, there is always the potential that unintentional transcriptional errors may still occur from this process.

## 2023-03-18 NOTE — Patient Instructions (Addendum)
Your procedure is scheduled on:03-29-23 Tuesday Report to the Registration Desk on the 1st floor of the Medical Mall.Then proceed to the 2nd floor Surgery Desk To find out your arrival time, please call [phone] between 1PM - 3PM on:03-28-23 Monday If your arrival time is 6:00 am, do not arrive before that time as the Medical Mall entrance doors do not open until 6:00 am.  REMEMBER: Instructions that are not followed completely may result in serious medical risk, up to and including death; or upon the discretion of your surgeon and anesthesiologist your surgery may need to be rescheduled.  Do not eat food OR drink any liquids after midnight the night before surgery.  No gum chewing or hard candies.  One week prior to surgery:Stop on 03-21-23 Stop Anti-inflammatories (NSAIDS) such as Advil, Aleve, Ibuprofen, Motrin, Naproxen, Naprosyn and Aspirin based products such as Excedrin, Goody's Powder, BC Powder. Stop ANY OVER THE COUNTER supplements until after surgery (Vitamin B12)  You may however, continue to take Tylenol if needed for pain up until the day of surgery.  Stop metFORMIN (GLUCOPHAGE) 2 days prior to surgery-Last dose will be on 03-26-23 Saturday  Continue taking all of your other prescription medications up until the day of surgery.  ON THE DAY OF SURGERY ONLY TAKE THESE MEDICATIONS WITH SIPS OF WATER: -amLODipine (NORVASC)   Use your Albuterol Inhaler the day of surgery and bring your Inhaler to the hospital  No Alcohol for 24 hours before or after surgery.  No Smoking including e-cigarettes for 24 hours before surgery.  No chewable tobacco products for at least 6 hours before surgery.  No nicotine patches on the day of surgery.  Do not use any "recreational" drugs for at least a week (preferably 2 weeks) before your surgery.  Please be advised that the combination of cocaine and anesthesia may have negative outcomes, up to and including death. If you test positive for  cocaine, your surgery will be cancelled.  On the morning of surgery brush your teeth with toothpaste and water, you may rinse your mouth with mouthwash if you wish. Do not swallow any toothpaste or mouthwash.  Do not wear jewelry, make-up, hairpins, clips or nail polish.  For welded (permanent) jewelry: bracelets, anklets, waist bands, etc.  Please have this removed prior to surgery.  If it is not removed, there is a chance that hospital personnel will need to cut it off on the day of surgery.  Do not wear lotions, powders, or perfumes.   Do not shave body hair from the neck down 48 hours before surgery.  Contact lenses, hearing aids and dentures may not be worn into surgery.  Do not bring valuables to the hospital. [facility] is not responsible for any missing/lost belongings or valuables.   Notify your doctor if there is any change in your medical condition (cold, fever, infection).  Wear comfortable clothing (specific to your surgery type) to the hospital.  After surgery, you can help prevent lung complications by doing breathing exercises.  Take deep breaths and cough every 1-2 hours. Your doctor may order a device called an Incentive Spirometer to help you take deep breaths. When coughing or sneezing, hold a pillow firmly against your incision with both hands. This is called "splinting." Doing this helps protect your incision. It also decreases belly discomfort.  If you are being admitted to the hospital overnight, leave your suitcase in the car. After surgery it may be brought to your room.  In case of  increased patient census, it may be necessary for you, the patient, to continue your postoperative care in the Same Day Surgery department.  If you are being discharged the day of surgery, you will not be allowed to drive home. You will need a responsible individual to drive you home and stay with you for 24 hours after surgery.   If you are taking public transportation, you will  need to have a responsible individual with you.  Please call the Pre-admissions Testing Dept. at [phone] if you have any questions about these instructions.  Surgery Visitation Policy:  Patients having surgery or a procedure may have two visitors.  Children under the age of 24 must have an adult with them who is not the patient.

## 2023-03-19 LAB — URINE CULTURE

## 2023-03-23 ENCOUNTER — Inpatient Hospital Stay (HOSPITAL_BASED_OUTPATIENT_CLINIC_OR_DEPARTMENT_OTHER): Payer: Medicare Other | Admitting: Oncology

## 2023-03-23 ENCOUNTER — Inpatient Hospital Stay: Payer: Medicare Other

## 2023-03-23 ENCOUNTER — Encounter: Payer: Self-pay | Admitting: Oncology

## 2023-03-23 VITALS — BP 133/68 | HR 85 | Temp 98.6°F | Resp 14 | Wt 118.0 lb

## 2023-03-23 DIAGNOSIS — Z720 Tobacco use: Secondary | ICD-10-CM | POA: Diagnosis not present

## 2023-03-23 DIAGNOSIS — D649 Anemia, unspecified: Secondary | ICD-10-CM

## 2023-03-23 DIAGNOSIS — C679 Malignant neoplasm of bladder, unspecified: Secondary | ICD-10-CM

## 2023-03-23 DIAGNOSIS — Z5112 Encounter for antineoplastic immunotherapy: Secondary | ICD-10-CM

## 2023-03-23 DIAGNOSIS — C3482 Malignant neoplasm of overlapping sites of left bronchus and lung: Secondary | ICD-10-CM

## 2023-03-23 LAB — CBC WITH DIFFERENTIAL (CANCER CENTER ONLY)
Abs Immature Granulocytes: 0.01 10*3/uL (ref 0.00–0.07)
Basophils Absolute: 0 10*3/uL (ref 0.0–0.1)
Basophils Relative: 0 %
Eosinophils Absolute: 0.1 10*3/uL (ref 0.0–0.5)
Eosinophils Relative: 2 %
HCT: 35.6 % — ABNORMAL LOW (ref 36.0–46.0)
Hemoglobin: 11.4 g/dL — ABNORMAL LOW (ref 12.0–15.0)
Immature Granulocytes: 0 %
Lymphocytes Relative: 21 %
Lymphs Abs: 1.2 10*3/uL (ref 0.7–4.0)
MCH: 28.6 pg (ref 26.0–34.0)
MCHC: 32 g/dL (ref 30.0–36.0)
MCV: 89.4 fL (ref 80.0–100.0)
Monocytes Absolute: 0.4 10*3/uL (ref 0.1–1.0)
Monocytes Relative: 6 %
Neutro Abs: 4 10*3/uL (ref 1.7–7.7)
Neutrophils Relative %: 71 %
Platelet Count: 239 10*3/uL (ref 150–400)
RBC: 3.98 MIL/uL (ref 3.87–5.11)
RDW: 16.8 % — ABNORMAL HIGH (ref 11.5–15.5)
WBC Count: 5.8 10*3/uL (ref 4.0–10.5)
nRBC: 0 % (ref 0.0–0.2)

## 2023-03-23 LAB — CMP (CANCER CENTER ONLY)
ALT: 24 U/L (ref 0–44)
AST: 33 U/L (ref 15–41)
Albumin: 3.9 g/dL (ref 3.5–5.0)
Alkaline Phosphatase: 61 U/L (ref 38–126)
Anion gap: 10 (ref 5–15)
BUN: 19 mg/dL (ref 8–23)
CO2: 27 mmol/L (ref 22–32)
Calcium: 9.1 mg/dL (ref 8.9–10.3)
Chloride: 104 mmol/L (ref 98–111)
Creatinine: 0.84 mg/dL (ref 0.44–1.00)
GFR, Estimated: >60 mL/min (ref >60)
Glucose, Bld: 102 mg/dL — ABNORMAL HIGH (ref 70–99)
Potassium: 3.5 mmol/L (ref 3.5–5.1)
Sodium: 141 mmol/L (ref 135–145)
Total Bilirubin: 0.4 mg/dL (ref <1.2)
Total Protein: 7.3 g/dL (ref 6.5–8.1)

## 2023-03-23 MED ORDER — SODIUM CHLORIDE 0.9 % IV SOLN
Freq: Once | INTRAVENOUS | Status: AC
  Filled 2023-03-23: qty 250

## 2023-03-23 MED ORDER — SODIUM CHLORIDE 0.9 % IV SOLN
200.0000 mg | Freq: Once | INTRAVENOUS | Status: AC
  Administered 2023-03-23: 200 mg via INTRAVENOUS
  Filled 2023-03-23: qty 200

## 2023-03-23 MED ORDER — HEPARIN SOD (PORK) LOCK FLUSH 100 UNIT/ML IV SOLN
500.0000 [IU] | Freq: Once | INTRAVENOUS | Status: AC | PRN
  Administered 2023-03-23: 500 [IU]
  Filled 2023-03-23: qty 5

## 2023-03-23 NOTE — Assessment & Plan Note (Signed)
Chronic anemia.  Hemoglobin is stable

## 2023-03-23 NOTE — Assessment & Plan Note (Signed)
Non-muscle invasive bladder cancer. S/p TURBT, T1 lesion, high grade.  Recommend patient to follow-up with urology, there is plan for intravesical BCG.

## 2023-03-23 NOTE — Assessment & Plan Note (Signed)
Encourage patient's smoking cessation efforts.

## 2023-03-23 NOTE — Progress Notes (Signed)
Hematology/Oncology Progress note [phone] [phone]) [phone]       REASON OF VISIT Stage IV Lung squamous cell carcinoma  ASSESSMENT & PLAN:   Cancer Staging  Malignant neoplasm of lung (HCC) Staging form: Lung, AJCC 8th Edition - Clinical: Stage IVA (cT4, cN3, cM1a) - Signed by Rickard Patience, MD on 06/28/2022  Urothelial carcinoma of bladder [facility]) Staging form: Urinary Bladder, AJCC 8th Edition - Clinical stage from 12/21/2022: Stage I (cT1, cN0, cM0) - Signed by Rickard Patience, MD on 12/29/2022   Malignant neoplasm of lung (HCC) Non-small cell lung cancer, favor squamous cell carcinoma, stage IV- TPS 90%, TMB 10, KRAS G12V MRI brain with and without contrast -  Left occipital focus of prior hemorrhage with mild linear contrast enhancement. Possible small metastasis or microhemmorhagic event.  TPS 90%, CT scan showed partial response. .Labs are reviewed and discussed with patient.  Proceed with Keytruda  Brain lesion, repeat MRI in May 2024 shows no brain metastatic disease.   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  Recommend patient to follow-up with urology, there is plan for intravesical BCG.  Encounter for antineoplastic immunotherapy Immunotherapy treatment plan as listed above.  Tobacco abuse Encourage patient's smoking cessation efforts.  Normocytic anemia Chronic anemia.  Hemoglobin is stable  No orders of the defined types were placed in this encounter.  Follow up in 3 weeks.   All questions were answered. The patient knows to call the clinic with any problems, questions or concerns.  Rickard Patience, MD, PhD [facility] Hematology Oncology 03/23/2023       HISTORY OF PRESENTING ILLNESS:  Regina Perkins 82 y.o. 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 I have reviewed her chart and  materials related to her cancer extensively and collaborated history with the patient. Summary of oncologic history is as follows: Oncology History  Malignant neoplasm of lung (HCC)  05/24/2022 Imaging   CT chest angiogram showed 1. No evidence for pulmonary embolism. 2. Left upper lobe/prevascular mass worrisome for neoplasm. 3. Mediastinal and hilar lymphadenopathy. 4. Multiple pulmonary nodules measuring up to 11 mm worrisome for metastatic disease. 5. Small left pleural effusion. 6. Patchy ground-glass and airspace opacities in the left upper lobe worrisome for infection. 7. Moderate-sized pericardial effusion. 8. Cholelithiasis. 9. Nonobstructing left renal calculus   05/25/2022 Imaging   CT abdomen pelvis wo contrast 1. No acute findings or explanation for the patient's symptoms. 2. No evidence of primary malignancy or metastatic disease within the abdomen or pelvis on noncontrast imaging.  3. Cholelithiasis without evidence of cholecystitis or biliary dilatation. 4. Nonobstructing left renal calculus. 5. Moderate stool throughout the colon suggesting constipation. 6.  Aortic Atherosclerosis    05/26/2022 Initial Diagnosis   Malignant neoplasm of lung - TPS 90%, TMB 10, KRAS G12V  -05/22/2022 in the emergency room, she was found to have hypoxia with oxygen levels of 86% on room air, improved to 94 with 2 L of oxygen.  D-dimer was elevated at 1.29, troponin negative.  Lower extremity Doppler was negative for DVT.  VQ scan high probability PE (Large segmental perfusion defect of the left upper lobe without corresponding radiographic abnormality. Additional bilateral wedge-shaped subsegmental branch perfusion defects .  Patient was admitted and started on heparin Echocardiogram showed no RV strain.  Moderate pericardial effusion. Over her hospitalization, she continues to have shortness of breath with minimal exertion. 05/24/2022,  CT chest angiogram showed left lung mass with bilateral  lung nodules.   06/16/2022 Imaging   PET scan  1. 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 No evidence of metastatic disease in the abdomen or pelvis. 2. Moderate pericardial effusion. 3. Tiny left pleural effusion. 4. Left renal stone. 5. Aortic atherosclerosis (ICD10-I70.0). Coronary artery calcification. 6. Enlarged pulmonic trunk, indicative of pulmonary arterial hypertension    06/21/2022 Procedure   US guided liver right supraclvicular node biopsy showed Metastatic poorly differentiated carcinoma, favor squamous cell carcinoma, probably pulmonary origin.    06/28/2022 Cancer Staging   Staging form: Lung, AJCC 8th Edition - Clinical: Stage IVA (cT4, cN3, cM1a) - Signed by Rickard Patience, MD on 06/28/2022   07/14/2022 -  Chemotherapy   Patient is on Treatment Plan : LUNG NSCLC Pembrolizumab (200) q21d     09/29/2022 Imaging   CT chest abdomen pelvis w contrast showed Bilateral pulmonary nodules, some of which are waxing/waning, but favoring multifocal lung carcinoma/metastases.   Improving thoracic nodal metastases, as above.  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 Consider cystoscopy as clinically warranted.  Aortic Atherosclerosis (ICD10-I70.0) and Emphysema (ICD10-J43.9)   01/06/2023 Imaging   CT chest abdomen pelvis w contrast showed 1. Interval decrease in size of multiple spiculated and subsolid pulmonary nodules, consistent with treatment response. 2. No significant change in matted post treatment prevascular and left superior mediastinal soft tissue. 3. Diminished size of an endoluminal bladder mass seen on prior examination, soft tissue in this vicinity now measuring no greater than 2.1 x 1.4 cm, previously at  least 4.7 x 2.1 cm. Presumably this has been at least partially resected. 4. No evidence of lymphadenopathy or metastatic disease in the abdomen or pelvis. 5. Cholelithiasis. 6. Nonobstructive left nephrolithiasis. 7. Coronary artery disease.   01/10/2023 Procedure   Medi port placed by IR   Urothelial carcinoma of bladder (HCC)  10/06/2022 Initial Diagnosis   Urothelial carcinoma of bladder S/p TURBT  1. 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 Bladder, biopsy, Base of tumor HIGH GRADE PAPILLARY UROTHELIAL CARCINOMA SUBMUCOSA AND MUSCULARIS PROPRIA IS PRESENT AND NOT INVOLVED BY CARCINOMA  Postprocedure, patient developed gross hematuria and was admitted to the hospital.  She received PRBC transfusion.  Hemoglobin improved and she was discharged.    12/21/2022 Cancer Staging   Staging form: Urinary Bladder, AJCC 8th Edition - Clinical stage from 12/21/2022: Stage I (cT1, cN0, cM0) - Signed by Rickard Patience, MD on 12/29/2022 WHO/ISUP grade (low/high): High Grade Histologic grading system: 2 grade system     INTERVAL HISTORY Lariah L Faith is a 82 y.o. female who has above history reviewed by me today presents for follow up visit for Lung squamous cell carcinoma She tolerates [place] well. Denies skin rash, diarrhea.  chronic SOB with exertion, improved she has been using her inhalers.  She reports feeling well.  + leg cramps better.    MEDICAL HISTORY:  Past Medical History:  Diagnosis Date   Atherosclerosis of native arteries of extremity with intermittent claudication (HCC)    Bladder mass    Chronic kidney disease, stage 3a (HCC)    COPD (chronic obstructive pulmonary disease) (HCC)  Diabetes mellitus without complication (HCC)    Gastrointestinal hemorrhage    GERD  (gastroesophageal reflux disease)    Glaucoma    History of duodenal ulcer    HTN (hypertension)    Hypertension    Hypokalemia    Hypomagnesemia    Leukocytosis    Lung mass    Malignant neoplasm of lung (HCC) 06/28/2022   a.) stage IVA (cT4, cN3, cM1a) favor squamous cell carcinoma --> TPS 90%, TMB 10, KRAS G12V   Malnutrition (HCC)    Normocytic anemia    Pericardial effusion 05/2022   Pulmonary embolism (HCC)    Sepsis (HCC)    Tobacco abuse    Type II diabetes mellitus with renal manifestations (HCC)    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   a.) stage I (cT1, cN0, cM0)    SURGICAL HISTORY: Past Surgical History:  Procedure Laterality Date   ABDOMINAL HYSTERECTOMY     BACK SURGERY     1988 and 1998   CYSTOSCOPY W/ RETROGRADES Bilateral 12/21/2022   Procedure: CYSTOSCOPY WITH RETROGRADE PYELOGRAM;  Surgeon: [name], MD;  Location: [facility] ORS;  Service: Urology;  Laterality: Bilateral;   ESOPHAGOGASTRODUODENOSCOPY (EGD) WITH PROPOFOL N/A 07/29/2022   Procedure: ESOPHAGOGASTRODUODENOSCOPY (EGD) WITH PROPOFOL;  Surgeon: Wyline Mood, MD;  Location[facility] ENDOSCOPY;  Service: Gastroenterology;  Laterality: N/A;   ESOPHAGOGASTRODUODENOSCOPY (EGD) WITH PROPOFOL N/A 01/21/2023   Procedure: ESOPHAGOGASTRODUODENOSCOPY (EGD) WITH PROPOFOL;  Surgeon: Wyline Mood, MD;  Location[facility] ENDOSCOPY;  Service: Gastroenterology;  Laterality: N/A;   IR IMAGING GUIDED PORT INSERTION  01/10/2023   TRANSURETHRAL RESECTION OF BLADDER TUMOR N/A 12/21/2022   Procedure: TRANSURETHRAL RESECTION OF BLADDER TUMOR (TURBT);  Surgeon: [name], MD;  Location: [facility] ORS;  Service: Urology;  Laterality: N/A;    SOCIAL HISTORY: Social History   Socioeconomic History   Marital status: Widowed    Spouse name: Not on file   Number of children: Not on file   Years of education: Not on file   Highest education level: Not on file  Occupational History   Not on file  Tobacco Use    Smoking status: Every Day    Current packs/day: 0.25    Types: Cigarettes   Smokeless tobacco: Never  Vaping Use   Vaping status: Never Used  Substance and Sexual Activity   Alcohol use: No   Drug use: No   Sexual activity: Not on file  Other Topics Concern   Not on file  Social History Narrative   Not on file   Social Determinants of Health   Financial Resource Strain: Low Risk  (06/28/2022)   Overall Financial Resource Strain (CARDIA)    Difficulty of Paying Living Expenses: Not very hard  Food Insecurity: No Food Insecurity (12/22/2022)   Hunger Vital Sign    Worried About Running Out of Food in the Last Year: Never true    Ran Out of Food in the Last Year: Never true  Transportation Needs: No Transportation Needs (12/22/2022)   PRAPARE - Administrator, Civil Service (Medical): No    Lack of Transportation (Non-Medical): No  Physical Activity: Not on file  Stress: No Stress Concern Present (06/28/2022)   [employer] of Occupational Health - Occupational Stress Questionnaire    Feeling of Stress : Not at all  Social Connections: Not on file  Intimate Partner Violence: Not At Risk (12/22/2022)   Humiliation, Afraid, Rape, and Kick questionnaire    Fear of Current or Ex-Partner: No  Emotionally Abused: No    Physically Abused: No    Sexually Abused: No    FAMILY HISTORY: Family History  Problem Relation Age of Onset   Asthma Mother    Heart disease Mother    Hypertension Mother    Diabetes Mother    Stroke Mother    Diabetes Sister    Diabetes Brother     ALLERGIES:  is allergic to ivp dye [iodinated contrast media] and shellfish allergy.  MEDICATIONS:  Current Outpatient Medications  Medication Sig Dispense Refill   albuterol (VENTOLIN HFA) 108 (90 Base) MCG/ACT inhaler Inhale 2 puffs into the lungs every 4 (four) hours as needed for wheezing or shortness of breath.     amLODipine (NORVASC) 10 MG tablet Take 10 mg by mouth every morning.      cyanocobalamin (VITAMIN B12) 1000 MCG tablet Take 1 tablet (1,000 mcg total) by mouth daily. 90 tablet 0   lidocaine-prilocaine (EMLA) cream Apply 1 Application topically as needed. Apply to port and cover with saran wrap 1-2 hours prior to port access 30 g 1   lisinopril (ZESTRIL) 40 MG tablet Take 40 mg by mouth every morning.     metFORMIN (GLUCOPHAGE) 1000 MG tablet Take 1,000 mg by mouth daily with breakfast.     potassium chloride (KLOR-CON) 10 MEQ tablet Take 1 tablet (10 mEq total) by mouth daily. Be sure to take dose on day of surgery. Follow up with PCP or oncology for repeat labs. 12 tablet 0   No current facility-administered medications for this visit.    Review of Systems  Constitutional:  Negative for appetite change, chills, fatigue and fever.  HENT:   Negative for hearing loss and voice change.   Eyes:  Negative for eye problems.  Respiratory:  Positive for shortness of breath. Negative for chest tightness and cough.   Cardiovascular:  Negative for chest pain.  Gastrointestinal:  Negative for abdominal distention, abdominal pain and blood in stool.  Endocrine: Negative for hot flashes.  Genitourinary:  Negative for difficulty urinating and frequency.   Musculoskeletal:  Negative for arthralgias.  Skin:  Negative for itching and rash.  Neurological:  Negative for extremity weakness.  Hematological:  Negative for adenopathy.  Psychiatric/Behavioral:  Negative for confusion.      PHYSICAL EXAMINATION: ECOG PERFORMANCE STATUS: 2 - Symptomatic, <50% confined to bed  Vitals:   03/23/23 1256  BP: 133/68  Pulse: 85  Resp: 14  Temp: 98.6 F (37 C)  SpO2: 100%    Filed Weights   03/23/23 1256  Weight: 118 lb (53.5 kg)     Physical Exam Constitutional:      General: She is not in acute distress.    Appearance: She is not diaphoretic.  HENT:     Head: Normocephalic.  Eyes:     General: No scleral icterus.    Pupils: Pupils are equal, round, and reactive to  light.  Cardiovascular:     Heart sounds: No murmur heard. Pulmonary:     Effort: Pulmonary effort is normal. No respiratory distress.     Comments: Decreased breath sounds bilaterally Abdominal:     General: There is no distension.  Musculoskeletal:        General: Normal range of motion.     Cervical back: Normal range of motion.  Skin:    Findings: No erythema.  Neurological:     Mental Status: She is alert and oriented to person, place, and time. Mental status is at baseline.  Cranial Nerves: No cranial nerve deficit.     Motor: No abnormal muscle tone.  Psychiatric:        Mood and Affect: Affect normal.      LABORATORY DATA:  I have reviewed the data as listed    Latest Ref Rng & Units 03/23/2023   12:38 PM 03/02/2023    8:12 AM 02/09/2023    8:35 AM  CBC  WBC 4.0 - 10.5 K/uL 5.8  5.9  6.2   Hemoglobin 12.0 - 15.0 g/dL 72.5  36.6  44.0   Hematocrit 36.0 - 46.0 % 35.6  33.6  36.2   Platelets 150 - 400 K/uL 239  227  265       Latest Ref Rng & Units 03/23/2023   12:38 PM 03/18/2023    9:40 AM 03/02/2023    8:12 AM  CMP  Glucose 70 - 99 mg/dL 347  92  425   BUN 8 - 23 mg/dL 19  16  23    Creatinine 0.44 - 1.00 mg/dL 9.56  3.87  5.64   Sodium 135 - 145 mmol/L 141  139  136   Potassium 3.5 - 5.1 mmol/L 3.5  3.1  3.3   Chloride 98 - 111 mmol/L 104  102  102   CO2 22 - 32 mmol/L 27  27  27    Calcium 8.9 - 10.3 mg/dL 9.1  9.3  9.0   Total Protein 6.5 - 8.1 g/dL 7.3   6.8   Total Bilirubin <1.2 mg/dL 0.4   0.5   Alkaline Phos 38 - 126 U/L 61   63   AST 15 - 41 U/L 33   28   ALT 0 - 44 U/L 24   20      RADIOGRAPHIC STUDIES: I have personally reviewed the radiological images as listed and agreed with the findings in the report. No results found.

## 2023-03-23 NOTE — Assessment & Plan Note (Signed)
Immunotherapy treatment plan as listed above.

## 2023-03-23 NOTE — Patient Instructions (Signed)
 [facility] - A DEPT OF [facility]  Discharge Instructions: Thank you for choosing [facility] to provide your oncology and hematology care.  If you have a lab appointment with the Cancer Center, please go directly to the Cancer Center and check in at the registration area.  Wear comfortable clothing and clothing appropriate for easy access to any Portacath or PICC line.   We strive to give you quality time with your provider. You may need to reschedule your appointment if you arrive late (15 or more minutes).  Arriving late affects you and other patients whose appointments are after yours.  Also, if you miss three or more appointments without notifying the office, you may be dismissed from the clinic at the provider's discretion.      For prescription refill requests, have your pharmacy contact our office and allow 72 hours for refills to be completed.    To help prevent nausea and vomiting after your treatment, we encourage you to take your nausea medication as directed.  BELOW ARE SYMPTOMS THAT SHOULD BE REPORTED IMMEDIATELY: *FEVER GREATER THAN 100.4 F (38 C) OR HIGHER *CHILLS OR SWEATING *NAUSEA AND VOMITING THAT IS NOT CONTROLLED WITH YOUR NAUSEA MEDICATION *UNUSUAL SHORTNESS OF BREATH *UNUSUAL BRUISING OR BLEEDING *URINARY PROBLEMS (pain or burning when urinating, or frequent urination) *BOWEL PROBLEMS (unusual diarrhea, constipation, pain near the anus) TENDERNESS IN MOUTH AND THROAT WITH OR WITHOUT PRESENCE OF ULCERS (sore throat, sores in mouth, or a toothache) UNUSUAL RASH, SWELLING OR PAIN  UNUSUAL VAGINAL DISCHARGE OR ITCHING   Items with * indicate a potential emergency and should be followed up as soon as possible or go to the Emergency Department if any problems should occur.  Please show the CHEMOTHERAPY ALERT CARD or IMMUNOTHERAPY ALERT CARD at check-in to the Emergency Department and triage nurse.  Should you have  questions after your visit or need to cancel or reschedule your appointment, please contact [place] [facility] - A DEPT OF [facility]  [phone] and follow the prompts.  Office hours are 8:00 a.m. to 4:30 p.m. Monday - Friday. Please note that voicemails left after 4:00 p.m. may not be returned until the following business day.  We are closed weekends and major holidays. You have access to a nurse at all times for urgent questions. Please call the main number to the clinic [phone] and follow the prompts.  For any non-urgent questions, you may also contact your provider using MyChart. We now offer e-Visits for anyone 57 and older to request care online for non-urgent symptoms. For details visit [email or web address].   Also download the MyChart app! Go to the app store, search "MyChart", open the app, select [place], and log in with your MyChart username and password.

## 2023-03-23 NOTE — Assessment & Plan Note (Signed)
Non-small cell lung cancer, favor squamous cell carcinoma, stage IV- TPS 90%, TMB 10, KRAS G12V MRI brain with and without contrast -  Left occipital focus of prior hemorrhage with mild linear contrast enhancement. Possible small metastasis or microhemmorhagic event.  TPS 90%, CT scan showed partial response. .Labs are reviewed and discussed with patient.  Proceed with Keytruda  Brain lesion, repeat MRI in May 2024 shows no brain metastatic disease.

## 2023-03-28 MED ORDER — CEFAZOLIN SODIUM-DEXTROSE 2-4 GM/100ML-% IV SOLN
2.0000 g | INTRAVENOUS | Status: AC
  Administered 2023-03-29: 2 g via INTRAVENOUS

## 2023-03-28 MED ORDER — CHLORHEXIDINE GLUCONATE 0.12 % MT SOLN
15.0000 mL | Freq: Once | OROMUCOSAL | Status: AC
  Administered 2023-03-29: 15 mL via OROMUCOSAL

## 2023-03-28 MED ORDER — ORAL CARE MOUTH RINSE: 15.0000 mL | Freq: Once | OROMUCOSAL | Status: AC

## 2023-03-28 MED ORDER — SODIUM CHLORIDE 0.9 % IV SOLN: INTRAVENOUS | Status: DC

## 2023-03-29 ENCOUNTER — Ambulatory Visit: Payer: Medicare Other | Admitting: Urgent Care

## 2023-03-29 ENCOUNTER — Other Ambulatory Visit: Payer: Self-pay | Admitting: Urology

## 2023-03-29 ENCOUNTER — Ambulatory Visit
Admission: RE | Admit: 2023-03-29 | Discharge: 2023-03-29 | Disposition: A | Discharge status: Home / Self Care | Payer: Medicare Other | Attending: Urology | Admitting: Urology

## 2023-03-29 ENCOUNTER — Ambulatory Visit: Payer: Medicare Other

## 2023-03-29 ENCOUNTER — Encounter
Admission: RE | Disposition: A | Discharge status: Home / Self Care | Payer: Self-pay | Source: Home / Self Care | Attending: Urology

## 2023-03-29 ENCOUNTER — Encounter: Payer: Self-pay | Admitting: Urology

## 2023-03-29 DIAGNOSIS — E1122 Type 2 diabetes mellitus with diabetic chronic kidney disease: Secondary | ICD-10-CM | POA: Insufficient documentation

## 2023-03-29 DIAGNOSIS — C661 Malignant neoplasm of right ureter: Secondary | ICD-10-CM

## 2023-03-29 DIAGNOSIS — Z7984 Long term (current) use of oral hypoglycemic drugs: Secondary | ICD-10-CM | POA: Insufficient documentation

## 2023-03-29 DIAGNOSIS — E46 Unspecified protein-calorie malnutrition: Secondary | ICD-10-CM

## 2023-03-29 DIAGNOSIS — I129 Hypertensive chronic kidney disease with stage 1 through stage 4 chronic kidney disease, or unspecified chronic kidney disease: Secondary | ICD-10-CM | POA: Diagnosis not present

## 2023-03-29 DIAGNOSIS — R918 Other nonspecific abnormal finding of lung field: Secondary | ICD-10-CM

## 2023-03-29 DIAGNOSIS — C674 Malignant neoplasm of posterior wall of bladder: Secondary | ICD-10-CM | POA: Diagnosis not present

## 2023-03-29 DIAGNOSIS — Z01818 Encounter for other preprocedural examination: Secondary | ICD-10-CM

## 2023-03-29 DIAGNOSIS — C678 Malignant neoplasm of overlapping sites of bladder: Secondary | ICD-10-CM

## 2023-03-29 DIAGNOSIS — Z01812 Encounter for preprocedural laboratory examination: Secondary | ICD-10-CM

## 2023-03-29 DIAGNOSIS — E876 Hypokalemia: Secondary | ICD-10-CM

## 2023-03-29 DIAGNOSIS — N1831 Chronic kidney disease, stage 3a: Secondary | ICD-10-CM | POA: Diagnosis not present

## 2023-03-29 DIAGNOSIS — C679 Malignant neoplasm of bladder, unspecified: Secondary | ICD-10-CM | POA: Diagnosis present

## 2023-03-29 HISTORY — PX: CYSTOSCOPY WITH STENT PLACEMENT: SHX5790

## 2023-03-29 HISTORY — PX: URETEROSCOPY: SHX842

## 2023-03-29 HISTORY — PX: CYSTOSCOPY WITH BIOPSY: SHX5122

## 2023-03-29 HISTORY — PX: TRANSURETHRAL RESECTION OF BLADDER TUMOR: SHX2575

## 2023-03-29 HISTORY — PX: HOLMIUM LASER APPLICATION: SHX5852

## 2023-03-29 LAB — GLUCOSE, CAPILLARY
Glucose-Capillary: 89 mg/dL (ref 70–99)
Glucose-Capillary: 102 mg/dL — ABNORMAL HIGH (ref 70–99)

## 2023-03-29 SURGERY — CYSTOSCOPY, WITH BIOPSY
Anesthesia: General | Laterality: Right

## 2023-03-29 MED ORDER — ONDANSETRON HCL 4 MG/2ML IJ SOLN
INTRAMUSCULAR | Status: DC | PRN
  Administered 2023-03-29: 4 mg via INTRAVENOUS

## 2023-03-29 MED ORDER — FENTANYL CITRATE (PF) 100 MCG/2ML IJ SOLN
INTRAMUSCULAR | Status: AC
  Filled 2023-03-29: qty 2

## 2023-03-29 MED ORDER — CEFAZOLIN SODIUM-DEXTROSE 2-4 GM/100ML-% IV SOLN
INTRAVENOUS | Status: AC
  Filled 2023-03-29: qty 100

## 2023-03-29 MED ORDER — PHENYLEPHRINE 80 MCG/ML (10ML) SYRINGE FOR IV PUSH (FOR BLOOD PRESSURE SUPPORT)
PREFILLED_SYRINGE | INTRAVENOUS | Status: DC | PRN
  Administered 2023-03-29: 160 ug via INTRAVENOUS
  Administered 2023-03-29: 80 ug via INTRAVENOUS
  Administered 2023-03-29: 160 ug via INTRAVENOUS
  Administered 2023-03-29: 80 ug via INTRAVENOUS

## 2023-03-29 MED ORDER — TROSPIUM CHLORIDE 20 MG PO TABS: 20.0000 mg | ORAL_TABLET | Freq: Two times a day (BID) | ORAL | 0 refills | Status: DC | PRN

## 2023-03-29 MED ORDER — FENTANYL CITRATE (PF) 100 MCG/2ML IJ SOLN
INTRAMUSCULAR | Status: DC | PRN
  Administered 2023-03-29 (×2): 50 ug via INTRAVENOUS

## 2023-03-29 MED ORDER — IOHEXOL 180 MG/ML  SOLN
INTRAMUSCULAR | Status: DC | PRN
  Administered 2023-03-29 (×2): 10 mL

## 2023-03-29 MED ORDER — ACETAMINOPHEN 10 MG/ML IV SOLN: 1000.0000 mg | Freq: Once | INTRAVENOUS | Status: DC | PRN

## 2023-03-29 MED ORDER — PROPOFOL 10 MG/ML IV BOLUS
INTRAVENOUS | Status: DC | PRN
  Administered 2023-03-29: 50 ug/kg/min via INTRAVENOUS

## 2023-03-29 MED ORDER — CHLORHEXIDINE GLUCONATE 0.12 % MT SOLN
OROMUCOSAL | Status: AC
  Filled 2023-03-29: qty 15

## 2023-03-29 MED ORDER — FENTANYL CITRATE (PF) 100 MCG/2ML IJ SOLN: 25.0000 ug | INTRAMUSCULAR | Status: DC | PRN

## 2023-03-29 MED ORDER — DEXAMETHASONE SODIUM PHOSPHATE 10 MG/ML IJ SOLN
INTRAMUSCULAR | Status: DC | PRN
  Administered 2023-03-29: 10 mg via INTRAVENOUS

## 2023-03-29 MED ORDER — OXYCODONE HCL 5 MG/5ML PO SOLN: 5.0000 mg | Freq: Once | ORAL | Status: DC | PRN

## 2023-03-29 MED ORDER — OXYCODONE HCL 5 MG PO TABS: 5.0000 mg | ORAL_TABLET | Freq: Once | ORAL | Status: DC | PRN

## 2023-03-29 MED ORDER — SODIUM CHLORIDE 0.9 % IR SOLN
Status: DC | PRN
  Administered 2023-03-29: 3000 mL
  Administered 2023-03-29: 6000 mL

## 2023-03-29 MED ORDER — ONDANSETRON HCL 4 MG/2ML IJ SOLN: 4.0000 mg | Freq: Once | INTRAMUSCULAR | Status: DC | PRN

## 2023-03-29 SURGICAL SUPPLY — 40 items
BAG DRAIN SIEMENS DORNER NS (MISCELLANEOUS) ×2 IMPLANT
BAG PRESSURE INF REUSE 3000 (BAG) ×2 IMPLANT
BAG URINE DRAIN 2000ML AR STRL (UROLOGICAL SUPPLIES) ×2 IMPLANT
BRUSH SCRUB EZ 1% IODOPHOR (MISCELLANEOUS) ×2 IMPLANT
BRUSH SCRUB EZ 4% CHG (MISCELLANEOUS) IMPLANT
CATH FOLEY 2WAY 18X30 (CATHETERS) IMPLANT
CATH FOLEY 2WAY SIL 18X30 (CATHETERS)
CATH URET FLEX-TIP 2 LUMEN 10F (CATHETERS) ×2 IMPLANT
CATH URETL OPEN 5X70 (CATHETERS) ×2 IMPLANT
CNTNR URN SCR LID CUP LEK RST (MISCELLANEOUS) ×2 IMPLANT
DRAPE UTILITY 15X26 TOWEL STRL (DRAPES) ×2 IMPLANT
DRSG TELFA 3X4 N-ADH STERILE (GAUZE/BANDAGES/DRESSINGS) ×2 IMPLANT
ELECT LOOP 22F BIPOLAR SML (ELECTROSURGICAL)
ELECT REM PT RETURN 9FT ADLT (ELECTROSURGICAL)
ELECTRODE LOOP 22F BIPOLAR SML (ELECTROSURGICAL) IMPLANT
ELECTRODE REM PT RTRN 9FT ADLT (ELECTROSURGICAL) ×2 IMPLANT
FIBER LASER MOSES 200 DFL (Laser) IMPLANT
FORCEPS BIOP PIRANHA Y (CUTTING FORCEPS) IMPLANT
GLOVE BIOGEL PI IND STRL 7.5 (GLOVE) ×2 IMPLANT
GOWN STRL REUS W/ TWL LRG LVL3 (GOWN DISPOSABLE) ×4 IMPLANT
GOWN STRL REUS W/ TWL XL LVL3 (GOWN DISPOSABLE) ×2 IMPLANT
GOWN STRL REUS W/TWL XL LVL4 (GOWN DISPOSABLE) ×2 IMPLANT
GUIDEWIRE GREEN .038 145CM (MISCELLANEOUS) ×2 IMPLANT
GUIDEWIRE STR DUAL SENSOR (WIRE) ×2 IMPLANT
IV NS IRRIG 3000ML ARTHROMATIC (IV SOLUTION) ×4 IMPLANT
KIT TURNOVER CYSTO (KITS) ×2 IMPLANT
LOOP CUT BIPOLAR 24F LRG (ELECTROSURGICAL) IMPLANT
NDL SAFETY ECLIPSE 18X1.5 (NEEDLE) ×2 IMPLANT
PACK CYSTO AR (MISCELLANEOUS) ×2 IMPLANT
SET CYSTO W/LG BORE CLAMP LF (SET/KITS/TRAYS/PACK) ×2 IMPLANT
SET IRRIG Y TYPE TUR BLADDER L (SET/KITS/TRAYS/PACK) ×2 IMPLANT
SHEATH NAVIGATOR HD 12/14X36 (SHEATH) ×2 IMPLANT
STENT URET 6FRX24 CONTOUR (STENTS) IMPLANT
STENT URET 6FRX26 CONTOUR (STENTS) IMPLANT
SURGILUBE 2OZ TUBE FLIPTOP (MISCELLANEOUS) ×2 IMPLANT
SYR TOOMEY IRRIG 70ML (MISCELLANEOUS) ×2
SYRINGE TOOMEY IRRIG 70ML (MISCELLANEOUS) ×2 IMPLANT
WATER STERILE IRR 1000ML POUR (IV SOLUTION) ×2 IMPLANT
WATER STERILE IRR 3000ML UROMA (IV SOLUTION) ×2 IMPLANT
WATER STERILE IRR 500ML POUR (IV SOLUTION) ×2 IMPLANT

## 2023-03-29 NOTE — Op Note (Signed)
Preoperative diagnosis:  High-grade urothelial carcinoma bladder Urothelial carcinoma right distal ureter  Postoperative diagnosis:  Same  Procedure: Transurethral resection bladder tumor (2-5 cm) Right ureteroscopy with biopsy/laser ablation papillary tumor right distal ureter Right ureteral stent placement (4.535F/20 cm) Right retrograde pyelogram  Surgeon: [name], MD  Anesthesia: General  Complications: None  Intraoperative findings:  Cystoscopy: Papillary tumor right hemitrigone measuring 2-3 mL and obscuring right distal ureter.  A sessile papillary tissue right i inferoposterior wall Right ureteroscopy-solitary papillary tumor < 2 mm right distal ureter ~ 1.5 cm from UO Right retrograde pyelogram-no filling defects, contrast extravasation  EBL: Minimal  Specimens:  Right posterior wall bladder tumor Tumor right hemi-trigone Tumor right distal ureter Base of right hemi-trigone tumor  Indication: Regina Perkins is a 82 y.o. female incidentally found to have a >5 cm bladder mass on PET/CT performed for lung cancer staging.  Initial TURBT performed 12/21/2022 with path showing high-grade urothelial carcinoma with 10% squamous differentiation and lamina propria invasion.  Tumor was noted in the right distal ureter and was not treated at the time of initial TURBT.  She presents for restaging biopsies and right ureteroscopy.  After reviewing the management options for treatment, he elected to proceed with the above surgical procedure(s). We have discussed the potential benefits and risks of the procedure, side effects of the proposed treatment, the likelihood of the patient achieving the goals of the procedure, and any potential problems that might occur during the procedure or recuperation. Informed consent has been obtained.  Description of procedure:  The patient was taken to the operating room and general anesthesia was induced.  The patient was placed in the dorsal  lithotomy position, prepped and draped in the usual sterile fashion, and preoperative antibiotics were administered. A preoperative time-out was performed.   A 26 French resectoscope sheath with obturator was lubricated and passed per urethra.  Panendoscopy was performed with findings as described above.  The posterior wall tumor was resected with bipolar current and cold biopsies of the base were performed and sent in the same specimen container.  Hemostasis was obtained with cautery.  The papillary tumor of the right hemitrigone was then resected.  The UO was identified during the resection with a small amount of papillary tumor noted which was further resected and revealed normal-appearing ureteral mucosa.  The resected specimen was removed via irrigation.  Cold biopsies of the tumor base were then performed and sent in a separate container.  A laser bridge was then inserted into the resectoscope sheath and a 0.038 Sensor wire was placed into the right UO and advanced into the renal pelvis under fluoroscopic guidance.  After removal of the resectoscope sheath a 4.5 French semirigid ureteroscope was passed per urethra and advanced into the ureter alongside the guidewire with findings as described above.  The small papillary tumor in the distal ureter was removed in toto with 1 bite of the Piranha forceps.  An additional biopsy of the base of tumor was obtained.  The biopsy site was then ablated with a 200 mcg holmium laser fiber at a setting of 1J/15 Hz.  Hemostasis was noted to be adequate and the ureteroscope was removed.  A 35F ureteral catheter was placed over the guidewire and retrograde pyelogram was then performed with findings as described above.  A 4.535F/20 cm Contour ureteral stent was then placed under fluoroscopic guidance with the proximal tip of the stent in an upper pole calyx and the distal and well-positioned in  the bladder.  The bladder was emptied with the cystoscope sheath with  clear urine noted.  After anesthetic reversal she was transported to the PACU in stable condition.  Plan: She will be contacted with the pathology results and further follow the recommendations at that time  [name], M.D.

## 2023-03-29 NOTE — Discharge Instructions (Signed)
DISCHARGE INSTRUCTIONS  MEDICATIONS:  1. Resume all your other meds from home.  2.  AZO (over-the-counter) can help with the burning/stinging when you urinate. 3.  Trospium is for bladder irritation, Rx was sent to your pharmacy.  ACTIVITY:  1. May resume regular activities in 24 hours.   SIGNS/SYMPTOMS TO CALL:  Common postoperative symptoms include urinary frequency, urgency, bladder spasm and blood in the urine  Please call us if you have a fever greater than 101.5, uncontrolled nausea/vomiting, uncontrolled pain, dizziness, unable to urinate, excessively bloody urine, chest pain, shortness of breath, leg swelling, leg pain, or any other concerns or questions.   You can reach [place] at [phone].   FOLLOW-UP:  1. You will be contacted with your pathology results and further follow-up recommendations

## 2023-03-29 NOTE — Anesthesia Procedure Notes (Signed)
Procedure Name: LMA Insertion Date/Time: 03/29/2023 9:30 AM  Performed by: Lysbeth Penner, CRNAPre-anesthesia Checklist: Patient identified, Emergency Drugs available, Suction available and Patient being monitored Patient Re-evaluated:Patient Re-evaluated prior to induction Oxygen Delivery Method: Circle system utilized Preoxygenation: Pre-oxygenation with 100% oxygen Induction Type: IV induction Ventilation: Mask ventilation without difficulty LMA: LMA inserted LMA Size: 3.0 Number of attempts: 1 Placement Confirmation: positive ETCO2 and breath sounds checked- equal and bilateral Tube secured with: Tape Dental Injury: Teeth and Oropharynx as per pre-operative assessment

## 2023-03-29 NOTE — Interval H&P Note (Signed)
History and Physical Interval Note:  03/29/2023 9:08 AM  Regina Perkins  has presented today for surgery, with the diagnosis of Bladder Cancer.  The various methods of treatment have been discussed with the patient and family. After consideration of risks, benefits and other options for treatment, the patient has consented to  Procedure(s): CYSTOSCOPY WITH BLADDER BIOPSY (N/A) TRANSURETHRAL RESECTION OF BLADDER TUMOR (TURBT) (N/A) DIAGNOSTIC URETEROSCOPY (Right) HOLMIUM LASER ABLATION (Right) CYSTOSCOPY WITH STENT PLACEMENT (Right) as a surgical intervention.  The patient's history has been reviewed, patient examined, no change in status, stable for surgery.  I have reviewed the patient's chart and labs.  Questions were answered to the patient's satisfaction.    CV:RRR Lungs:clear  [name]

## 2023-03-29 NOTE — Anesthesia Preprocedure Evaluation (Addendum)
Anesthesia Evaluation  Patient identified by MRN, date of birth, ID band Patient awake    Reviewed: Allergy & Precautions, NPO status , Patient's Chart, lab work & pertinent test results  History of Anesthesia Complications Negative for: history of anesthetic complications  Airway Mallampati: III  TM Distance: >3 FB Neck ROM: Full    Dental  (+) Edentulous Upper, Edentulous Lower   Pulmonary shortness of breath and with exertion, neg sleep apnea, COPD, Current Smoker and Patient abstained from smoking.   Pulmonary exam normal breath sounds clear to auscultation       Cardiovascular Exercise Tolerance: Good METShypertension, Pt. on medications + Peripheral Vascular Disease  (-) CAD and (-) Past MI (-) dysrhythmias  Rhythm:Regular Rate:Normal - Systolic murmurs TTE 04/2022: 1. Left ventricular ejection fraction, by estimation, is 60 to 65%. The  left ventricle has normal function. The left ventricle has no regional  wall motion abnormalities. There is severe concentric left ventricular  hypertrophy. Left ventricular diastolic   parameters are consistent with Grade I diastolic dysfunction (impaired  relaxation).   2. Right ventricular systolic function is normal. The right ventricular  size is normal. There is normal pulmonary artery systolic pressure. The  estimated right ventricular systolic pressure is 30.0 mmHg.   3. Moderate pericardial effusion. The pericardial effusion is  circumferential, localized near the right atrium and anterior to the right  ventricle. There is no evidence of cardiac tamponade.   4. The mitral valve is normal in structure. No evidence of mitral valve  regurgitation. No evidence of mitral stenosis.   5. The aortic valve is normal in structure. Aortic valve regurgitation is  mild. No aortic stenosis is present.   6. Aortic dilatation noted. There is mild dilatation of the aortic root,  measuring 40 mm.    7. The inferior vena cava is normal in size with greater than 50%  respiratory variability, suggesting right atrial pressure of 3 mmHg.     Neuro/Psych negative neurological ROS  negative psych ROS   GI/Hepatic ,neg GERD  ,,(+)     (-) substance abuse    Endo/Other  diabetes, Oral Hypoglycemic Agents    Renal/GU Renal diseasenegative Renal ROS     Musculoskeletal   Abdominal   Peds  Hematology   Anesthesia Other Findings Past Medical History: No date: Atherosclerosis of native arteries of extremity with  intermittent claudication (HCC) No date: Bladder mass No date: Chronic kidney disease, stage 3a (HCC) No date: COPD (chronic obstructive pulmonary disease) (HCC) No date: Diabetes mellitus without complication (HCC) No date: Gastrointestinal hemorrhage No date: GERD (gastroesophageal reflux disease) No date: Glaucoma No date: History of duodenal ulcer No date: HTN (hypertension) No date: Hypertension No date: Hypokalemia No date: Hypomagnesemia No date: Leukocytosis No date: Lung mass 06/28/2022: Malignant neoplasm of lung (HCC)     Comment:  a.) stage IVA (cT4, cN3, cM1a) favor squamous cell               carcinoma --> TPS 90%, TMB 10, KRAS G12V No date: Malnutrition (HCC) No date: Normocytic anemia 05/2022: Pericardial effusion No date: Pulmonary embolism (HCC) No date: Sepsis (HCC) No date: Tobacco abuse No date: Type II diabetes mellitus with renal manifestations (HCC) 12/21/2022: Urothelial carcinoma of bladder (HCC)     Comment:  a.) stage I (cT1, cN0, cM0)  Reproductive/Obstetrics  Anesthesia Physical Anesthesia Plan  ASA: 3  Anesthesia Plan: General   Post-op Pain Management: Ofirmev IV (intra-op)*   Induction: Intravenous  PONV Risk Score and Plan: 3 and Ondansetron, Dexamethasone and Treatment may vary due to age or medical condition  Airway Management Planned: LMA  Additional  Equipment: None  Intra-op Plan:   Post-operative Plan: Extubation in OR  Informed Consent: I have reviewed the patients History and Physical, chart, labs and discussed the procedure including the risks, benefits and alternatives for the proposed anesthesia with the patient or authorized representative who has indicated his/her understanding and acceptance.     Dental advisory given  Plan Discussed with: CRNA and Surgeon  Anesthesia Plan Comments: (Discussed risks of anesthesia with patient, including PONV, sore throat, lip/dental/eye damage. Rare risks discussed as well, such as cardiorespiratory and neurological sequelae, and allergic reactions. Discussed the role of CRNA in patient's perioperative care. Patient understands. Patient counseled on benefits of smoking cessation, and increased perioperative risks associated with continued smoking. )       Anesthesia Quick Evaluation

## 2023-03-29 NOTE — Transfer of Care (Signed)
Immediate Anesthesia Transfer of Care Note  Patient: Marayah L Grimm  Procedure(s) Performed: CYSTOSCOPY WITH BLADDER BIOPSY TRANSURETHRAL RESECTION OF BLADDER TUMOR (TURBT) DIAGNOSTIC URETEROSCOPY (Right) HOLMIUM LASER ABLATION (Right) CYSTOSCOPY WITH STENT PLACEMENT (Right)  Patient Location: PACU  Anesthesia Type:General  Level of Consciousness: drowsy  Airway & Oxygen Therapy: Patient Spontanous Breathing  Post-op Assessment: Report given to RN and Post -op Vital signs reviewed and stable  Post vital signs: Reviewed and stable  Last Vitals:  Vitals Value Taken Time  BP 210/96 03/29/23 1041  Temp    Pulse 88 03/29/23 1046  Resp 21 03/29/23 1046  SpO2 95 % 03/29/23 1046  Vitals shown include unfiled device data.  Last Pain:  Vitals:   03/29/23 0850  TempSrc: Temporal  PainSc: 0-No pain      Patients Stated Pain Goal: 0 (03/29/23 0850)  Complications: There were no known notable events for this encounter.

## 2023-03-30 LAB — SURGICAL PATHOLOGY

## 2023-04-07 ENCOUNTER — Ambulatory Visit: Payer: Medicare Other | Admitting: Urology

## 2023-04-08 ENCOUNTER — Ambulatory Visit: Payer: Medicare Other | Admitting: Urology

## 2023-04-08 ENCOUNTER — Encounter: Payer: Self-pay | Admitting: Urology

## 2023-04-08 VITALS — BP 102/62 | HR 112 | Temp 98.5°F | Ht 63.0 in

## 2023-04-08 DIAGNOSIS — D494 Neoplasm of unspecified behavior of bladder: Secondary | ICD-10-CM

## 2023-04-08 DIAGNOSIS — C661 Malignant neoplasm of right ureter: Secondary | ICD-10-CM

## 2023-04-08 DIAGNOSIS — C679 Malignant neoplasm of bladder, unspecified: Secondary | ICD-10-CM | POA: Diagnosis not present

## 2023-04-08 DIAGNOSIS — R6883 Chills (without fever): Secondary | ICD-10-CM

## 2023-04-08 LAB — URINALYSIS, COMPLETE
Bilirubin, UA: NEGATIVE
Glucose, UA: NEGATIVE
Nitrite, UA: NEGATIVE
Specific Gravity, UA: 1.025 (ref 1.005–1.030)
Urobilinogen, Ur: 4 mg/dL — ABNORMAL HIGH (ref 0.2–1.0)
pH, UA: 5.5 (ref 5.0–7.5)

## 2023-04-08 LAB — MICROSCOPIC EXAMINATION
RBC, Urine: >30 /[HPF] — AB (ref 0–2)
WBC, UA: >30 /[HPF] — AB (ref 0–5)

## 2023-04-08 NOTE — Progress Notes (Unsigned)
Regina Perkins,acting as a scribe for Regina Altes, MD.,have documented all relevant documentation on the behalf of Regina Altes, MD,as directed by  Regina Altes, MD while in the presence of Regina Altes, MD.  04/08/23 10:58 AM   Regina Perkins Mar 24, 1941 [id number]  Referring provider: [facility], Regina Perkins [address] [place],  [place] [address]  Chief Complaint  Patient presents with   Results    HPI: Regina Perkins is a 82 y.o. female who presents for post-op follow up.   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 All visible tumor was resected, as well as biopsies of base of previous resection. Right ureteroscopy did show a <2 mm papillary tumor in the right distal ureter, which was biopsied and later ablated. Pathology: Bladder findings remarkable for high-grade papillary urothelial carcinoma. No muscle involvement was seen. The ureteral biopsy showed a tiny fragment of high-grade papillary urothelial carcinoma. She states that since her surgery, she has had intermittent chills, but no fever. She otherwise feels well.    PMH: Past Medical History:  Diagnosis Date   Atherosclerosis of native arteries of extremity with intermittent claudication (HCC)    Bladder mass    Chronic kidney disease, stage 3a (HCC)    COPD (chronic obstructive pulmonary disease) (HCC)    Diabetes mellitus without complication (HCC)    Gastrointestinal hemorrhage    GERD (gastroesophageal reflux disease)    Glaucoma    History of duodenal ulcer    HTN (hypertension)    Hypertension    Hypokalemia    Hypomagnesemia    Leukocytosis    Lung mass    Malignant neoplasm of lung  (HCC) 06/28/2022   a.) stage IVA (cT4, cN3, cM1a) favor squamous cell carcinoma --> TPS 90%, TMB 10, KRAS G12V   Malnutrition (HCC)    Normocytic anemia    Pericardial effusion 05/2022   Pulmonary embolism (HCC)    Sepsis (HCC)    Tobacco abuse    Type II diabetes mellitus with renal manifestations (HCC)    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   a.) stage I (cT1, cN0, cM0)    Surgical History: Past Surgical History:  Procedure Laterality Date   ABDOMINAL HYSTERECTOMY     BACK SURGERY     1988 and 1998   CYSTOSCOPY W/ RETROGRADES Bilateral 12/21/2022   Procedure: CYSTOSCOPY WITH RETROGRADE PYELOGRAM;  Surgeon: Regina Altes, MD;  Location: [facility]S;  Service: Urology;  Laterality: Bilateral;   CYSTOSCOPY WITH BIOPSY N/A 03/29/2023   Procedure: CYSTOSCOPY WITH BLADDER BIOPSY;  Surgeon: Regina Altes, MD;  Location: [facility];  Service: Urology;  Laterality: N/A;   CYSTOSCOPY WITH STENT PLACEMENT Right 03/29/2023   Procedure: CYSTOSCOPY WITH STENT PLACEMENT;  Surgeon: Regina Altes, MD;  Location: [facility];  Service: Urology;  Laterality: Right;   ESOPHAGOGASTRODUODENOSCOPY (EGD) WITH PROPOFOL N/A 07/29/2022   Procedure: ESOPHAGOGASTRODUODENOSCOPY (EGD) WITH PROPOFOL;  Surgeon: Wyline Mood, MD;  Location: [facility] ENDOSCOPY;  Service: Gastroenterology;  Laterality: N/A;   ESOPHAGOGASTRODUODENOSCOPY (EGD) WITH PROPOFOL N/A 01/21/2023   Procedure: ESOPHAGOGASTRODUODENOSCOPY (EGD) WITH PROPOFOL;  Surgeon: Wyline Mood, MD;  Location[facility];  Service: Gastroenterology;  Laterality: N/A;   HOLMIUM LASER APPLICATION Right 03/29/2023  Procedure: HOLMIUM LASER ABLATION;  Surgeon: Regina Altes, MD;  Location: [facility];  Service: Urology;  Laterality: Right;   IR IMAGING GUIDED PORT INSERTION  01/10/2023   TRANSURETHRAL RESECTION OF BLADDER TUMOR N/A 12/21/2022   Procedure: TRANSURETHRAL RESECTION OF BLADDER TUMOR (TURBT);  Surgeon: Regina Altes, MD;  Location: [facility];   Service: Urology;  Laterality: N/A;   TRANSURETHRAL RESECTION OF BLADDER TUMOR N/A 03/29/2023   Procedure: TRANSURETHRAL RESECTION OF BLADDER TUMOR (TURBT);  Surgeon: Regina Altes, MD;  Location: [facility];  Service: Urology;  Laterality: N/A;   URETEROSCOPY Right 03/29/2023   Procedure: DIAGNOSTIC URETEROSCOPY;  Surgeon: Regina Altes, MD;  Location: [facility];  Service: Urology;  Laterality: Right;    Home Medications:  Allergies as of 04/08/2023       Reactions   Ivp Dye [iodinated Contrast Media]    Shellfish Allergy Other (See Comments)   Reaction: unknown         Medication List        Accurate as of April 08, 2023 10:58 AM. If you have any questions, ask your nurse or doctor.          albuterol 108 (90 Base) MCG/ACT inhaler Commonly known as: VENTOLIN HFA Inhale 2 puffs into the lungs every 4 (four) hours as needed for wheezing or shortness of breath.   amLODipine 10 MG tablet Commonly known as: NORVASC Take 10 mg by mouth every morning.   cyanocobalamin 1000 MCG tablet Commonly known as: VITAMIN B12 Take 1 tablet (1,000 mcg total) by mouth daily.   lidocaine-prilocaine cream Commonly known as: EMLA Apply 1 Application topically as needed. Apply to port and cover with saran wrap 1-2 hours prior to port access   lisinopril 40 MG tablet Commonly known as: ZESTRIL Take 40 mg by mouth every morning.   metFORMIN 1000 MG tablet Commonly known as: GLUCOPHAGE Take 1,000 mg by mouth daily with breakfast.   potassium chloride 10 MEQ tablet Commonly known as: KLOR-CON Take 1 tablet (10 mEq total) by mouth daily. Be sure to take dose on day of surgery. Follow up with PCP or oncology for repeat labs.   trospium 20 MG tablet Commonly known as: SANCTURA Take 1 tablet (20 mg total) by mouth 2 (two) times daily as needed (frequency,urgency,bladder spasm).        Allergies:  Allergies  Allergen Reactions   Ivp Dye [Iodinated Contrast Media]    Shellfish  Allergy Other (See Comments)    Reaction: unknown     Family History: Family History  Problem Relation Age of Onset   Asthma Mother    Heart disease Mother    Hypertension Mother    Diabetes Mother    Stroke Mother    Diabetes Sister    Diabetes Brother     Social History:  reports that she has been smoking cigarettes. She has never used smokeless tobacco. She reports that she does not drink alcohol and does not use drugs.   Physical Exam: BP 102/62 (BP Location: Left Arm, Patient Position: Sitting, Cuff Size: Normal)   Pulse (!) 112   Temp 98.5 F (36.9 C) (Oral)   Ht 5\' 3"  (1.6 m)   BMI 20.55 kg/m   Constitutional:  Alert and oriented, No acute distress. HEENT: [place] AT Psychiatric: Normal mood and affect.   Assessment & Plan:    1. 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 Urinalysis was ordered.   2.  Urothelial carcinoma, right distal ureter Small papillary tumor, which was removed via biopsy and surrounding area laser ablated She has an indwelling stent and will keep this while she undergoes BCG treatment Post-BCG follow up cystoscopy/ ureteroscopies with possible biopsies.    I have reviewed the above documentation for accuracy and completeness, and I agree with the above.   Regina Altes, MD  [facility] [address] [place], [place] [address] [phone]

## 2023-04-11 LAB — CULTURE, URINE COMPREHENSIVE

## 2023-04-12 ENCOUNTER — Other Ambulatory Visit: Payer: Self-pay | Admitting: *Deleted

## 2023-04-12 ENCOUNTER — Encounter: Payer: Self-pay | Admitting: *Deleted

## 2023-04-12 MED ORDER — CEFUROXIME AXETIL 250 MG PO TABS: 250.0000 mg | ORAL_TABLET | Freq: Two times a day (BID) | ORAL | 0 refills | Status: AC

## 2023-04-13 ENCOUNTER — Inpatient Hospital Stay (HOSPITAL_BASED_OUTPATIENT_CLINIC_OR_DEPARTMENT_OTHER): Payer: Medicare Other | Admitting: Oncology

## 2023-04-13 ENCOUNTER — Inpatient Hospital Stay: Payer: Medicare Other | Attending: Oncology

## 2023-04-13 ENCOUNTER — Inpatient Hospital Stay: Payer: Medicare Other

## 2023-04-13 ENCOUNTER — Telehealth: Payer: Self-pay

## 2023-04-13 ENCOUNTER — Encounter: Payer: Self-pay | Admitting: Oncology

## 2023-04-13 VITALS — BP 140/77 | HR 94 | Temp 96.4°F | Resp 18 | Wt 113.4 lb

## 2023-04-13 DIAGNOSIS — C787 Secondary malignant neoplasm of liver and intrahepatic bile duct: Secondary | ICD-10-CM | POA: Diagnosis present

## 2023-04-13 DIAGNOSIS — C679 Malignant neoplasm of bladder, unspecified: Secondary | ICD-10-CM | POA: Diagnosis not present

## 2023-04-13 DIAGNOSIS — C771 Secondary and unspecified malignant neoplasm of intrathoracic lymph nodes: Secondary | ICD-10-CM | POA: Diagnosis not present

## 2023-04-13 DIAGNOSIS — E876 Hypokalemia: Secondary | ICD-10-CM

## 2023-04-13 DIAGNOSIS — E871 Hypo-osmolality and hyponatremia: Secondary | ICD-10-CM | POA: Insufficient documentation

## 2023-04-13 DIAGNOSIS — Z86711 Personal history of pulmonary embolism: Secondary | ICD-10-CM

## 2023-04-13 DIAGNOSIS — C3482 Malignant neoplasm of overlapping sites of left bronchus and lung: Secondary | ICD-10-CM

## 2023-04-13 DIAGNOSIS — Z79899 Other long term (current) drug therapy: Secondary | ICD-10-CM | POA: Diagnosis not present

## 2023-04-13 DIAGNOSIS — R7989 Other specified abnormal findings of blood chemistry: Secondary | ICD-10-CM | POA: Diagnosis not present

## 2023-04-13 DIAGNOSIS — D649 Anemia, unspecified: Secondary | ICD-10-CM | POA: Insufficient documentation

## 2023-04-13 DIAGNOSIS — Z5112 Encounter for antineoplastic immunotherapy: Secondary | ICD-10-CM

## 2023-04-13 DIAGNOSIS — C3412 Malignant neoplasm of upper lobe, left bronchus or lung: Secondary | ICD-10-CM | POA: Insufficient documentation

## 2023-04-13 DIAGNOSIS — N3000 Acute cystitis without hematuria: Secondary | ICD-10-CM | POA: Diagnosis not present

## 2023-04-13 DIAGNOSIS — Z8551 Personal history of malignant neoplasm of bladder: Secondary | ICD-10-CM | POA: Diagnosis not present

## 2023-04-13 DIAGNOSIS — F17210 Nicotine dependence, cigarettes, uncomplicated: Secondary | ICD-10-CM | POA: Insufficient documentation

## 2023-04-13 DIAGNOSIS — N390 Urinary tract infection, site not specified: Secondary | ICD-10-CM | POA: Insufficient documentation

## 2023-04-13 LAB — CBC WITH DIFFERENTIAL (CANCER CENTER ONLY)
Abs Immature Granulocytes: 0.17 10*3/uL — ABNORMAL HIGH (ref 0.00–0.07)
Basophils Absolute: 0 10*3/uL (ref 0.0–0.1)
Basophils Relative: 0 %
Eosinophils Absolute: 0 10*3/uL (ref 0.0–0.5)
Eosinophils Relative: 0 %
HCT: 30.8 % — ABNORMAL LOW (ref 36.0–46.0)
Hemoglobin: 10.3 g/dL — ABNORMAL LOW (ref 12.0–15.0)
Immature Granulocytes: 1 %
Lymphocytes Relative: 9 %
Lymphs Abs: 1.2 10*3/uL (ref 0.7–4.0)
MCH: 28.9 pg (ref 26.0–34.0)
MCHC: 33.4 g/dL (ref 30.0–36.0)
MCV: 86.3 fL (ref 80.0–100.0)
Monocytes Absolute: 0.9 10*3/uL (ref 0.1–1.0)
Monocytes Relative: 7 %
Neutro Abs: 11.1 10*3/uL — ABNORMAL HIGH (ref 1.7–7.7)
Neutrophils Relative %: 83 %
Platelet Count: 357 10*3/uL (ref 150–400)
RBC: 3.57 MIL/uL — ABNORMAL LOW (ref 3.87–5.11)
RDW: 16 % — ABNORMAL HIGH (ref 11.5–15.5)
WBC Count: 13.4 10*3/uL — ABNORMAL HIGH (ref 4.0–10.5)
nRBC: 0 % (ref 0.0–0.2)

## 2023-04-13 LAB — CMP (CANCER CENTER ONLY)
ALT: 14 U/L (ref 0–44)
AST: 19 U/L (ref 15–41)
Albumin: 3.1 g/dL — ABNORMAL LOW (ref 3.5–5.0)
Alkaline Phosphatase: 61 U/L (ref 38–126)
Anion gap: 11 (ref 5–15)
BUN: 30 mg/dL — ABNORMAL HIGH (ref 8–23)
CO2: 27 mmol/L (ref 22–32)
Calcium: 8.7 mg/dL — ABNORMAL LOW (ref 8.9–10.3)
Chloride: 92 mmol/L — ABNORMAL LOW (ref 98–111)
Creatinine: 1.17 mg/dL — ABNORMAL HIGH (ref 0.44–1.00)
GFR, Estimated: 47 mL/min — ABNORMAL LOW (ref >60)
Glucose, Bld: 180 mg/dL — ABNORMAL HIGH (ref 70–99)
Potassium: 3.4 mmol/L — ABNORMAL LOW (ref 3.5–5.1)
Sodium: 130 mmol/L — ABNORMAL LOW (ref 135–145)
Total Bilirubin: 0.6 mg/dL (ref <1.2)
Total Protein: 6.6 g/dL (ref 6.5–8.1)

## 2023-04-13 MED ORDER — SODIUM CHLORIDE 0.9 % IV SOLN
200.0000 mg | Freq: Once | INTRAVENOUS | Status: AC
  Administered 2023-04-13: 200 mg via INTRAVENOUS
  Filled 2023-04-13: qty 200

## 2023-04-13 MED ORDER — SODIUM CHLORIDE 0.9 % IV SOLN
Freq: Once | INTRAVENOUS | Status: AC
  Filled 2023-04-13: qty 250

## 2023-04-13 MED ORDER — HEPARIN SOD (PORK) LOCK FLUSH 100 UNIT/ML IV SOLN
500.0000 [IU] | Freq: Once | INTRAVENOUS | Status: AC | PRN
Start: 2023-04-13 — End: 2023-04-13
  Administered 2023-04-13: 500 [IU]
  Filled 2023-04-13: qty 5

## 2023-04-13 NOTE — Patient Instructions (Signed)
Iron Sucrose Injection What is this medication? IRON SUCROSE (EYE ern SOO krose) treats low levels of iron (iron deficiency anemia) in people with kidney disease. Iron is a mineral that plays an important role in making red blood cells, which carry oxygen from your lungs to the rest of your body. This medicine may be used for other purposes; ask your health care provider or pharmacist if you have questions. COMMON BRAND NAME(S): Venofer What should I tell my care team before I take this medication? They need to know if you have any of these conditions: Anemia not caused by low iron levels Heart disease High levels of iron in the blood Kidney disease Liver disease An unusual or allergic reaction to iron, other medications, foods, dyes, or preservatives Pregnant or trying to get pregnant Breastfeeding How should I use this medication? This medication is for infusion into a vein. It is given in a hospital or clinic setting. Talk to your care team about the use of this medication in children. While this medication may be prescribed for children as young as 2 years for selected conditions, precautions do apply. Overdosage: If you think you have taken too much of this medicine contact a poison control center or emergency room at once. NOTE: This medicine is only for you. Do not share this medicine with others. What if I miss a dose? Keep appointments for follow-up doses. It is important not to miss your dose. Call your care team if you are unable to keep an appointment. What may interact with this medication? Do not take this medication with any of the following: Deferoxamine Dimercaprol Other iron products This medication may also interact with the following: Chloramphenicol Deferasirox This list may not describe all possible interactions. Give your health care provider a list of all the medicines, herbs, non-prescription drugs, or dietary supplements you use. Also tell them if you smoke,  drink alcohol, or use illegal drugs. Some items may interact with your medicine. What should I watch for while using this medication? Visit your care team regularly. Tell your care team if your symptoms do not start to get better or if they get worse. You may need blood work done while you are taking this medication. You may need to follow a special diet. Talk to your care team. Foods that contain iron include: whole grains/cereals, dried fruits, beans, or peas, leafy green vegetables, and organ meats (liver, kidney). What side effects may I notice from receiving this medication? Side effects that you should report to your care team as soon as possible: Allergic reactions--skin rash, itching, hives, swelling of the face, lips, tongue, or throat Low blood pressure--dizziness, feeling faint or lightheaded, blurry vision Shortness of breath Side effects that usually do not require medical attention (report to your care team if they continue or are bothersome): Flushing Headache Joint pain Muscle pain Nausea Pain, redness, or irritation at injection site This list may not describe all possible side effects. Call your doctor for medical advice about side effects. You may report side effects to FDA at 1-800-FDA-1088. Where should I keep my medication? This medication is given in a hospital or clinic. It will not be stored at home. NOTE: This sheet is a summary. It may not cover all possible information. If you have questions about this medicine, talk to your doctor, pharmacist, or health care provider.  2024 Elsevier/Gold Standard (2022-09-17 00:00:00)

## 2023-04-13 NOTE — Assessment & Plan Note (Addendum)
Finish current antibiotics course.

## 2023-04-13 NOTE — Assessment & Plan Note (Signed)
Immunotherapy treatment plan as listed above. 

## 2023-04-13 NOTE — Assessment & Plan Note (Signed)
K has improved. 3.4

## 2023-04-13 NOTE — Telephone Encounter (Signed)
Pt informed of MD recommendation and verbalized understanding.   Pt would  like a call with appts details.   1L IVF this week Lab/NP/ IVF next week (bmp)

## 2023-04-13 NOTE — Assessment & Plan Note (Signed)
 Chronic anemia.  Hemoglobin is stable

## 2023-04-13 NOTE — Telephone Encounter (Signed)
-----   Message from Rickard Patience sent at 04/13/2023 12:59 PM EST ----- Her sodium level is low and creatinine elevated. Please arrange her to get IVF 1L NS over 1 hour.  Follow up with NP next week lab NP BMP thanks.   zy

## 2023-04-13 NOTE — Assessment & Plan Note (Addendum)
Non-small cell lung cancer, favor squamous cell carcinoma, stage IV- TPS 90%, TMB 10, KRAS G12V MRI brain with and without contrast -  Left occipital focus of prior hemorrhage with mild linear contrast enhancement. Possible small metastasis or microhemmorhagic event.  TPS 90%, CT scan showed partial response. .Labs are reviewed and discussed with patient.  Proceed with Keytruda Repeat CT in Jan 2025  Brain lesion, repeat MRI in May 2024 shows no brain metastatic disease.

## 2023-04-13 NOTE — Progress Notes (Signed)
Nutrition Follow-up:  Patient with stage IV lung cancer.  S/p TURBT for urothelial carcinoma of bladder.  Noted transuretheral resection of bladder tumor, cystoscopy on 12/3.  Patient receiving [place].  Met with patient during infusion.  Reports that appetite has been decreased over the last couple of weeks.  Says that she has an infection and currently on antibiotics.  Yesterday ate brunswick stew with crackers and apple juice.  Also drank an ensure plus shake.  Has been eating crackers with cheese and peanut butter.  Reports does have some queasiness at times.  Bowels are moving normally.    Medications: reviewed  Labs: Na 130, K 3.4, glucose 180, BUN 30, creatinine 1.17, calcium 8.7, albumin 3.1  Anthropometrics:   Weight 113 lb 6.4 oz, decreased  114 lb 14.4 oz on 9/25 116 lb 6.4 oz on 8/14 115 lb on 6/12 118 lb on 5/1 116 lb on 4/9 115 lb on 3/15   NUTRITION DIAGNOSIS: Inadequate oral intake ongoing    INTERVENTION:  Encouraged small frequent/meals/snack of high calorie, high protein foods Continue 350+ calorie shake If weight continues to decline consider trial of appetite stimulant    MONITORING, EVALUATION, GOAL: weight trends, intake   NEXT VISIT: Wednesday, Jan 8 during infusion  Evaristo Tsuda B. Freida Busman, RD, LDN Registered Dietitian [phone]

## 2023-04-13 NOTE — Assessment & Plan Note (Addendum)
Encourage oral hydration and avoid nephrotoxins.  Hyponatremia and elevated creatinine. Likely due to dehydration.  Will arrange patient to get 1 L of NS for hydration.

## 2023-04-13 NOTE — Assessment & Plan Note (Signed)
She has been off  Elqiuis 5mg  BID due to GI bleeding

## 2023-04-13 NOTE — Assessment & Plan Note (Addendum)
12/21/2022 Non-muscle invasive bladder cancer. S/p TURBT, T1 lesion, high grade.  03/29/2023 Non-muscle invasive bladder cancer. S/p TURBT Recommend patient to follow-up with urology,

## 2023-04-13 NOTE — Progress Notes (Signed)
Hematology/Oncology Progress note [phone]) [phone] Fax:([phone]) [phone]       REASON OF VISIT Stage IV Lung squamous cell carcinoma  ASSESSMENT & PLAN:   Cancer Staging  Malignant neoplasm of lung (HCC) Staging form: Lung, AJCC 8th Edition - Clinical: Stage IVA (cT4, cN3, cM1a) - Signed by Rickard Patience, MD on 06/28/2022  Urothelial carcinoma of bladder [facility]) Staging form: Urinary Bladder, AJCC 8th Edition - Clinical stage from 12/21/2022: Stage I (cT1, cN0, cM0) - Signed by Rickard Patience, MD on 12/29/2022   Malignant neoplasm of lung (HCC) Non-small cell lung cancer, favor squamous cell carcinoma, stage IV- TPS 90%, TMB 10, KRAS G12V MRI brain with and without contrast -  Left occipital focus of prior hemorrhage with mild linear contrast enhancement. Possible small metastasis or microhemmorhagic event.  TPS 90%, CT scan showed partial response. .Labs are reviewed and discussed with patient.  Proceed with Keytruda Repeat CT in Jan 2025  Brain lesion, repeat MRI in May 2024 shows no brain metastatic disease.   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 S/p TURBT, T1 lesion, high grade.  03/29/2023 Non-muscle invasive bladder cancer. S/p TURBT Recommend patient to follow-up with urology,   UTI (urinary tract infection) Finish current antibiotics course.    Encounter for antineoplastic immunotherapy Immunotherapy treatment plan as listed above.  History of pulmonary embolism She has been off  Elqiuis 5mg  BID due to GI bleeding  Hypokalemia K has improved. 3.4    Normocytic anemia Chronic anemia.  Hemoglobin is stable   Elevated serum creatinine Encourage oral hydration and avoid nephrotoxins.  Hyponatremia and elevated creatinine. Likely due to dehydration.  Will arrange patient to get 1 L of NS for hydration.   No orders of the defined types were placed in this encounter.  Follow up in 3 weeks.   All questions were answered.  The patient knows to call the clinic with any problems, questions or concerns.  Rickard Patience, MD, PhD [facility] Hematology Oncology 04/13/2023       HISTORY OF PRESENTING ILLNESS:  Regina Perkins 82 y.o. 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 I have reviewed her chart and materials related to her cancer extensively and collaborated history with the patient. Summary of oncologic history is as follows: Oncology History  Malignant neoplasm of lung (HCC)  05/24/2022 Imaging   CT chest angiogram showed 1. No evidence for pulmonary embolism. 2. Left upper lobe/prevascular mass worrisome for neoplasm. 3. Mediastinal and hilar lymphadenopathy. 4. Multiple pulmonary nodules measuring up to 11 mm worrisome for metastatic disease. 5. Small left pleural effusion. 6. Patchy ground-glass and airspace opacities in the left upper lobe worrisome for infection. 7. Moderate-sized pericardial effusion. 8. Cholelithiasis. 9. Nonobstructing left renal calculus   05/25/2022 Imaging   CT abdomen pelvis wo contrast 1. No acute findings or explanation for the patient's symptoms. 2. No evidence of primary malignancy or metastatic disease within the abdomen or pelvis on noncontrast imaging.  3. Cholelithiasis without evidence of cholecystitis or biliary dilatation. 4. Nonobstructing left renal calculus. 5. Moderate stool throughout the colon suggesting constipation. 6.  Aortic Atherosclerosis    05/26/2022 Initial Diagnosis   Malignant neoplasm of lung - TPS 90%, TMB 10, KRAS G12V  -05/22/2022 in the emergency room, she was found to have hypoxia with oxygen levels of 86% on room air, improved to 94 with 2 L of oxygen.  D-dimer was elevated at  1.29, troponin negative.  Lower extremity Doppler was negative for DVT.  VQ scan high probability PE (Large segmental perfusion defect of the left upper lobe without  corresponding radiographic abnormality. Additional bilateral wedge-shaped subsegmental branch perfusion defects .  Patient was admitted and started on heparin Echocardiogram showed no RV strain.  Moderate pericardial effusion. Over her hospitalization, she continues to have shortness of breath with minimal exertion. 05/24/2022, CT chest angiogram showed left lung mass with bilateral lung nodules.   06/16/2022 Imaging   PET scan  1. 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 No evidence of metastatic disease in the abdomen or pelvis. 2. Moderate pericardial effusion. 3. Tiny left pleural effusion. 4. Left renal stone. 5. Aortic atherosclerosis (ICD10-I70.0). Coronary artery calcification. 6. Enlarged pulmonic trunk, indicative of pulmonary arterial hypertension    06/21/2022 Procedure   US guided liver right supraclvicular node biopsy showed Metastatic poorly differentiated carcinoma, favor squamous cell carcinoma, probably pulmonary origin.    06/28/2022 Cancer Staging   Staging form: Lung, AJCC 8th Edition - Clinical: Stage IVA (cT4, cN3, cM1a) - Signed by Rickard Patience, MD on 06/28/2022   07/14/2022 -  Chemotherapy   Patient is on Treatment Plan : LUNG NSCLC Pembrolizumab (200) q21d     09/29/2022 Imaging   CT chest abdomen pelvis w contrast showed Bilateral pulmonary nodules, some of which are waxing/waning, but favoring multifocal lung carcinoma/metastases.   Improving thoracic nodal metastases, as above.  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 Consider cystoscopy as clinically warranted.  Aortic Atherosclerosis (ICD10-I70.0) and Emphysema (ICD10-J43.9)   01/06/2023 Imaging   CT chest abdomen pelvis w contrast showed 1.  Interval decrease in size of multiple spiculated and subsolid pulmonary nodules, consistent with treatment response. 2. No significant change in matted post treatment prevascular and left superior mediastinal soft tissue. 3. Diminished size of an endoluminal bladder mass seen on prior examination, soft tissue in this vicinity now measuring no greater than 2.1 x 1.4 cm, previously at least 4.7 x 2.1 cm. Presumably this has been at least partially resected. 4. No evidence of lymphadenopathy or metastatic disease in the abdomen or pelvis. 5. Cholelithiasis. 6. Nonobstructive left nephrolithiasis. 7. Coronary artery disease.   01/10/2023 Procedure   Medi port placed by IR   Urothelial carcinoma of bladder (HCC)  10/06/2022 Initial Diagnosis   Urothelial carcinoma of bladder S/p TURBT  1. 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 Bladder, biopsy, Base of tumor HIGH GRADE PAPILLARY UROTHELIAL CARCINOMA SUBMUCOSA AND MUSCULARIS PROPRIA IS PRESENT AND NOT INVOLVED BY CARCINOMA  Postprocedure, patient developed gross hematuria and was admitted to the hospital.  She received PRBC transfusion.  Hemoglobin improved and she was discharged.    12/21/2022 Cancer Staging   Staging form: Urinary Bladder, AJCC 8th Edition - Clinical stage from 12/21/2022: Stage I (cT1, cN0, cM0) - Signed by Rickard Patience, MD on 12/29/2022 WHO/ISUP grade (low/high): High Grade Histologic grading system: 2 grade system     INTERVAL HISTORY Regina Perkins is a 82 y.o. female who has above history reviewed by me today presents for follow up visit for Lung squamous cell carcinoma She tolerates [place] well. Denies skin rash, diarrhea.  chronic SOB with exertion, improved she has been using her inhalers.  She reports feeling  cold chronically. No fever or chills. On Ceftin for klebsiella UTI Recent cystoscopy, non invasive high grade papillary urothelial carcinomas s/p TURBT, intravesical BCGs   MEDICAL HISTORY:  Past Medical History:  Diagnosis Date   Atherosclerosis of native arteries of extremity with intermittent claudication (HCC)    Bladder mass    Chronic kidney disease, stage 3a (HCC)    COPD (chronic obstructive pulmonary disease) (HCC)    Diabetes mellitus without complication (HCC)    Gastrointestinal hemorrhage    GERD (gastroesophageal reflux disease)    Glaucoma    History of duodenal ulcer    HTN (hypertension)    Hypertension    Hypokalemia    Hypomagnesemia    Leukocytosis    Lung mass    Malignant neoplasm of lung (HCC) 06/28/2022   a.) stage IVA (cT4, cN3, cM1a) favor squamous cell carcinoma --> TPS 90%, TMB 10, KRAS G12V   Malnutrition (HCC)    Normocytic anemia    Pericardial effusion 05/2022   Pulmonary embolism (HCC)    Sepsis (HCC)    Tobacco abuse    Type II diabetes mellitus with renal manifestations (HCC)    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   a.) stage I (cT1, cN0, cM0)    SURGICAL HISTORY: Past Surgical History:  Procedure Laterality Date   ABDOMINAL HYSTERECTOMY     BACK SURGERY     1988 and 1998   CYSTOSCOPY W/ RETROGRADES Bilateral 12/21/2022   Procedure: CYSTOSCOPY WITH RETROGRADE PYELOGRAM;  Surgeon: [name], MD;  Location: [facility] ORS;  Service: Urology;  Laterality: Bilateral;   CYSTOSCOPY WITH BIOPSY N/A 03/29/2023   Procedure: CYSTOSCOPY WITH BLADDER BIOPSY;  Surgeon: [name], MD;  Location: [facility] ORS;  Service: Urology;  Laterality: N/A;   CYSTOSCOPY WITH STENT PLACEMENT Right 03/29/2023   Procedure: CYSTOSCOPY WITH STENT PLACEMENT;  Surgeon: [name], MD;  Location: [facility] ORS;  Service: Urology;  Laterality: Right;   ESOPHAGOGASTRODUODENOSCOPY (EGD) WITH PROPOFOL N/A 07/29/2022   Procedure: ESOPHAGOGASTRODUODENOSCOPY  (EGD) WITH PROPOFOL;  Surgeon: Wyline Mood, MD;  Location: [facility] ENDOSCOPY;  Service: Gastroenterology;  Laterality: N/A;   ESOPHAGOGASTRODUODENOSCOPY (EGD) WITH PROPOFOL N/A 01/21/2023   Procedure: ESOPHAGOGASTRODUODENOSCOPY (EGD) WITH PROPOFOL;  Surgeon: Wyline Mood, MD;  Location[facility] ENDOSCOPY;  Service: Gastroenterology;  Laterality: N/A;   HOLMIUM LASER APPLICATION Right 03/29/2023   Procedure: HOLMIUM LASER ABLATION;  Surgeon: [name], MD;  Location: [facility] ORS;  Service: Urology;  Laterality: Right;   IR IMAGING GUIDED PORT INSERTION  01/10/2023   TRANSURETHRAL RESECTION OF BLADDER TUMOR N/A 12/21/2022   Procedure: TRANSURETHRAL RESECTION OF BLADDER TUMOR (TURBT);  Surgeon: [name], MD;  Location: [facility] ORS;  Service: Urology;  Laterality: N/A;   TRANSURETHRAL RESECTION OF BLADDER TUMOR N/A 03/29/2023   Procedure: TRANSURETHRAL RESECTION OF BLADDER TUMOR (TURBT);  Surgeon: [name], MD;  Location: [facility] ORS;  Service: Urology;  Laterality: N/A;   URETEROSCOPY Right 03/29/2023   Procedure: DIAGNOSTIC URETEROSCOPY;  Surgeon: [name], MD;  Location: [facility] ORS;  Service: Urology;  Laterality: Right;    SOCIAL HISTORY: Social History   Socioeconomic History   Marital status: Widowed    Spouse name: Not on file   Number of children: Not on file   Years of education: Not on file   Highest education level: Not on file  Occupational History   Not on file  Tobacco  Use   Smoking status: Every Day    Current packs/day: 0.25    Types: Cigarettes   Smokeless tobacco: Never  Vaping Use   Vaping status: Never Used  Substance and Sexual Activity   Alcohol use: No   Drug use: No   Sexual activity: Not on file  Other Topics Concern   Not on file  Social History Narrative   Not on file   Social Drivers of Health   Financial Resource Strain: Low Risk  (06/28/2022)   Overall Financial Resource Strain (CARDIA)    Difficulty of Paying Living Expenses: Not very hard   Food Insecurity: No Food Insecurity (12/22/2022)   Hunger Vital Sign    Worried About Running Out of Food in the Last Year: Never true    Ran Out of Food in the Last Year: Never true  Transportation Needs: No Transportation Needs (12/22/2022)   PRAPARE - Administrator, Civil Service (Medical): No    Lack of Transportation (Non-Medical): No  Physical Activity: Not on file  Stress: No Stress Concern Present (06/28/2022)   [employer] of Occupational Health - Occupational Stress Questionnaire    Feeling of Stress : Not at all  Social Connections: Not on file  Intimate Partner Violence: Not At Risk (12/22/2022)   Humiliation, Afraid, Rape, and Kick questionnaire    Fear of Current or Ex-Partner: No    Emotionally Abused: No    Physically Abused: No    Sexually Abused: No    FAMILY HISTORY: Family History  Problem Relation Age of Onset   Asthma Mother    Heart disease Mother    Hypertension Mother    Diabetes Mother    Stroke Mother    Diabetes Sister    Diabetes Brother     ALLERGIES:  is allergic to ivp dye [iodinated contrast media] and shellfish allergy.  MEDICATIONS:  Current Outpatient Medications  Medication Sig Dispense Refill   albuterol (VENTOLIN HFA) 108 (90 Base) MCG/ACT inhaler Inhale 2 puffs into the lungs every 4 (four) hours as needed for wheezing or shortness of breath.     amLODipine (NORVASC) 10 MG tablet Take 10 mg by mouth every morning.     cefUROXime (CEFTIN) 250 MG tablet Take 1 tablet (250 mg total) by mouth 2 (two) times daily with a meal for 7 days. 14 tablet 0   cyanocobalamin (VITAMIN B12) 1000 MCG tablet Take 1 tablet (1,000 mcg total) by mouth daily. 90 tablet 0   lidocaine-prilocaine (EMLA) cream Apply 1 Application topically as needed. Apply to port and cover with saran wrap 1-2 hours prior to port access 30 g 1   lisinopril (ZESTRIL) 40 MG tablet Take 40 mg by mouth every morning.     metFORMIN (GLUCOPHAGE) 1000 MG tablet Take  1,000 mg by mouth daily with breakfast.     potassium chloride (KLOR-CON) 10 MEQ tablet Take 1 tablet (10 mEq total) by mouth daily. Be sure to take dose on day of surgery. Follow up with PCP or oncology for repeat labs. 12 tablet 0   trospium (SANCTURA) 20 MG tablet Take 1 tablet (20 mg total) by mouth 2 (two) times daily as needed (frequency,urgency,bladder spasm). 60 tablet 0   No current facility-administered medications for this visit.    Review of Systems  Constitutional:  Negative for appetite change, chills, fatigue and fever.  HENT:   Negative for hearing loss and voice change.   Eyes:  Negative for eye problems.  Respiratory:  Positive for shortness of breath. Negative for chest tightness and cough.   Cardiovascular:  Negative for chest pain.  Gastrointestinal:  Negative for abdominal distention, abdominal pain and blood in stool.  Endocrine: Negative for hot flashes.  Genitourinary:  Negative for difficulty urinating and frequency.   Musculoskeletal:  Negative for arthralgias.  Skin:  Negative for itching and rash.  Neurological:  Negative for extremity weakness.  Hematological:  Negative for adenopathy.  Psychiatric/Behavioral:  Negative for confusion.      PHYSICAL EXAMINATION: ECOG PERFORMANCE STATUS: 2 - Symptomatic, <50% confined to bed  Vitals:   04/13/23 0903  BP: (!) 140/77  Pulse: 94  Resp: 18  Temp: (!) 96.4 F (35.8 C)  SpO2: 100%    Filed Weights   04/13/23 0903  Weight: 113 lb 6.4 oz (51.4 kg)     Physical Exam Constitutional:      General: She is not in acute distress.    Appearance: She is not diaphoretic.  HENT:     Head: Normocephalic.  Eyes:     General: No scleral icterus.    Pupils: Pupils are equal, round, and reactive to light.  Cardiovascular:     Heart sounds: No murmur heard. Pulmonary:     Effort: Pulmonary effort is normal. No respiratory distress.     Comments: Decreased breath sounds bilaterally Abdominal:     General:  There is no distension.  Musculoskeletal:        General: Normal range of motion.     Cervical back: Normal range of motion.  Skin:    Findings: No erythema.  Neurological:     Mental Status: She is alert and oriented to person, place, and time. Mental status is at baseline.     Cranial Nerves: No cranial nerve deficit.     Motor: No abnormal muscle tone.  Psychiatric:        Mood and Affect: Affect normal.      LABORATORY DATA:  I have reviewed the data as listed    Latest Ref Rng & Units 04/13/2023    8:54 AM 03/23/2023   12:38 PM 03/02/2023    8:12 AM  CBC  WBC 4.0 - 10.5 K/uL 13.4  5.8  5.9   Hemoglobin 12.0 - 15.0 g/dL 52.8  41.3  24.4   Hematocrit 36.0 - 46.0 % 30.8  35.6  33.6   Platelets 150 - 400 K/uL 357  239  227       Latest Ref Rng & Units 04/13/2023    8:54 AM 03/23/2023   12:38 PM 03/18/2023    9:40 AM  CMP  Glucose 70 - 99 mg/dL 010  272  92   BUN 8 - 23 mg/dL 30  19  16    Creatinine 0.44 - 1.00 mg/dL 5.36  6.44  0.34   Sodium 135 - 145 mmol/L 130  141  139   Potassium 3.5 - 5.1 mmol/L 3.4  3.5  3.1   Chloride 98 - 111 mmol/L 92  104  102   CO2 22 - 32 mmol/L 27  27  27    Calcium 8.9 - 10.3 mg/dL 8.7  9.1  9.3   Total Protein 6.5 - 8.1 g/dL 6.6  7.3    Total Bilirubin <1.2 mg/dL 0.6  0.4    Alkaline Phos 38 - 126 U/L 61  61    AST 15 - 41 U/L 19  33    ALT 0 - 44 U/L 14  24  RADIOGRAPHIC STUDIES: I have personally reviewed the radiological images as listed and agreed with the findings in the report. DG OR UROLOGY CYSTO IMAGE ([facility] ONLY) Result Date: 03/29/2023 There is no interpretation for this exam.  This order is for images obtained during a surgical procedure.  Please See "Surgeries" Tab for more information regarding the procedure.

## 2023-04-14 ENCOUNTER — Other Ambulatory Visit: Payer: Self-pay | Admitting: *Deleted

## 2023-04-14 ENCOUNTER — Encounter: Payer: Self-pay | Admitting: Oncology

## 2023-04-14 DIAGNOSIS — E860 Dehydration: Secondary | ICD-10-CM | POA: Insufficient documentation

## 2023-04-14 NOTE — Anesthesia Postprocedure Evaluation (Signed)
Anesthesia Post Note  Patient: Regina Perkins  Procedure(s) Performed: CYSTOSCOPY WITH BLADDER BIOPSY TRANSURETHRAL RESECTION OF BLADDER TUMOR (TURBT) DIAGNOSTIC URETEROSCOPY (Right) HOLMIUM LASER ABLATION (Right) CYSTOSCOPY WITH STENT PLACEMENT (Right)  Patient location during evaluation: PACU Anesthesia Type: General Level of consciousness: awake and alert Pain management: pain level controlled Vital Signs Assessment: post-procedure vital signs reviewed and stable Respiratory status: spontaneous breathing, nonlabored ventilation, respiratory function stable and patient connected to nasal cannula oxygen Cardiovascular status: blood pressure returned to baseline and stable Postop Assessment: no apparent nausea or vomiting Anesthetic complications: no   There were no known notable events for this encounter.   Last Vitals:  Vitals:   03/29/23 1120 03/29/23 1123  BP:  (!) 161/101  Pulse: 89   Resp: 16   Temp:    SpO2: 97%     Last Pain:  Vitals:   03/29/23 1041  TempSrc:   PainSc: 0-No pain                 Lenard Simmer

## 2023-04-15 ENCOUNTER — Inpatient Hospital Stay: Payer: Medicare Other

## 2023-04-15 DIAGNOSIS — E860 Dehydration: Secondary | ICD-10-CM

## 2023-04-15 DIAGNOSIS — Z5112 Encounter for antineoplastic immunotherapy: Secondary | ICD-10-CM | POA: Diagnosis not present

## 2023-04-15 DIAGNOSIS — C3482 Malignant neoplasm of overlapping sites of left bronchus and lung: Secondary | ICD-10-CM

## 2023-04-15 MED ORDER — HEPARIN SOD (PORK) LOCK FLUSH 100 UNIT/ML IV SOLN
500.0000 [IU] | Freq: Once | INTRAVENOUS | Status: AC | PRN
Start: 2023-04-15 — End: 2023-04-15
  Administered 2023-04-15: 500 [IU]
  Filled 2023-04-15: qty 5

## 2023-04-15 MED ORDER — SODIUM CHLORIDE 0.9% FLUSH
10.0000 mL | Freq: Once | INTRAVENOUS | Status: AC | PRN
  Administered 2023-04-15: 10 mL
  Filled 2023-04-15: qty 10

## 2023-04-15 MED ORDER — SODIUM CHLORIDE 0.9 % IV SOLN
Freq: Once | INTRAVENOUS | Status: AC
Start: 2023-04-16 — End: 2023-04-15
  Filled 2023-04-15: qty 250

## 2023-04-21 ENCOUNTER — Telehealth: Payer: Self-pay | Admitting: *Deleted

## 2023-04-21 NOTE — Telephone Encounter (Addendum)
Regina Perkins [id number] Aug 14, 1940   Provider-    Procedure [id number]- no prior auth Drug ONGE:[id number]-     Urothelial Carcinoma of Bladder -  C67.9  Expected date of instillation ____all _____2025_______    Below to be completed by staff member contacting insurance.   BCG verification completed- Yes  Pa needed- No  Insurance contacted -  complete         Auth number:____[id number]_______________  Approval dates : ____all 2025______________      Pt aware and instructions given.   JQ aware to order Bcg.  Date: 04/21/2023

## 2023-04-25 ENCOUNTER — Ambulatory Visit: Payer: Medicare Other

## 2023-04-25 ENCOUNTER — Ambulatory Visit: Payer: Medicare Other | Admitting: Nurse Practitioner

## 2023-04-25 ENCOUNTER — Other Ambulatory Visit: Payer: Medicare Other

## 2023-04-26 ENCOUNTER — Encounter: Payer: Self-pay | Admitting: Urology

## 2023-04-29 ENCOUNTER — Inpatient Hospital Stay: Payer: Medicare Other

## 2023-04-29 ENCOUNTER — Inpatient Hospital Stay: Payer: Medicare Other | Attending: Oncology

## 2023-04-29 ENCOUNTER — Inpatient Hospital Stay (HOSPITAL_BASED_OUTPATIENT_CLINIC_OR_DEPARTMENT_OTHER): Payer: Medicare Other | Admitting: Nurse Practitioner

## 2023-04-29 ENCOUNTER — Encounter: Payer: Self-pay | Admitting: Nurse Practitioner

## 2023-04-29 VITALS — BP 157/69 | HR 99 | Temp 98.4°F | Wt 117.0 lb

## 2023-04-29 DIAGNOSIS — R7989 Other specified abnormal findings of blood chemistry: Secondary | ICD-10-CM

## 2023-04-29 DIAGNOSIS — Z79899 Other long term (current) drug therapy: Secondary | ICD-10-CM | POA: Insufficient documentation

## 2023-04-29 DIAGNOSIS — F17210 Nicotine dependence, cigarettes, uncomplicated: Secondary | ICD-10-CM | POA: Insufficient documentation

## 2023-04-29 DIAGNOSIS — E876 Hypokalemia: Secondary | ICD-10-CM | POA: Diagnosis not present

## 2023-04-29 DIAGNOSIS — Z5112 Encounter for antineoplastic immunotherapy: Secondary | ICD-10-CM | POA: Insufficient documentation

## 2023-04-29 DIAGNOSIS — C679 Malignant neoplasm of bladder, unspecified: Secondary | ICD-10-CM | POA: Insufficient documentation

## 2023-04-29 DIAGNOSIS — C771 Secondary and unspecified malignant neoplasm of intrathoracic lymph nodes: Secondary | ICD-10-CM | POA: Diagnosis not present

## 2023-04-29 DIAGNOSIS — C787 Secondary malignant neoplasm of liver and intrahepatic bile duct: Secondary | ICD-10-CM | POA: Diagnosis present

## 2023-04-29 DIAGNOSIS — C3412 Malignant neoplasm of upper lobe, left bronchus or lung: Secondary | ICD-10-CM | POA: Insufficient documentation

## 2023-04-29 DIAGNOSIS — C3482 Malignant neoplasm of overlapping sites of left bronchus and lung: Secondary | ICD-10-CM

## 2023-04-29 LAB — BASIC METABOLIC PANEL - CANCER CENTER ONLY
Anion gap: 10 (ref 5–15)
BUN: 16 mg/dL (ref 8–23)
CO2: 26 mmol/L (ref 22–32)
Calcium: 8.8 mg/dL — ABNORMAL LOW (ref 8.9–10.3)
Chloride: 100 mmol/L (ref 98–111)
Creatinine: 0.92 mg/dL (ref 0.44–1.00)
GFR, Estimated: >60 mL/min (ref >60)
Glucose, Bld: 220 mg/dL — ABNORMAL HIGH (ref 70–99)
Potassium: 3.2 mmol/L — ABNORMAL LOW (ref 3.5–5.1)
Sodium: 136 mmol/L (ref 135–145)

## 2023-04-29 MED ORDER — HEPARIN SOD (PORK) LOCK FLUSH 100 UNIT/ML IV SOLN
500.0000 [IU] | Freq: Once | INTRAVENOUS | Status: AC
Start: 2023-04-29 — End: 2023-04-29
  Administered 2023-04-29: 500 [IU] via INTRAVENOUS
  Filled 2023-04-29: qty 5

## 2023-04-29 MED ORDER — SODIUM CHLORIDE 0.9% FLUSH
10.0000 mL | Freq: Once | INTRAVENOUS | Status: AC
Start: 2023-04-29 — End: 2023-04-29
  Administered 2023-04-29: 10 mL via INTRAVENOUS
  Filled 2023-04-29: qty 10

## 2023-04-29 NOTE — Progress Notes (Signed)
 Hematology/Oncology Progress Note [phone] [phone]     REASON OF VISIT Stage IV Lung squamous cell carcinoma  HISTORY OF PRESENTING ILLNESS:  Regina Perkins 83 y.o. 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 I have reviewed her chart and materials related to her cancer extensively and collaborated history with the patient. Summary of oncologic history is as follows: Oncology History  Malignant neoplasm of lung (HCC)  05/24/2022 Imaging   CT chest angiogram showed 1. No evidence for pulmonary embolism. 2. Left upper lobe/prevascular mass worrisome for neoplasm. 3. Mediastinal and hilar lymphadenopathy. 4. Multiple pulmonary nodules measuring up to 11 mm worrisome for metastatic disease. 5. Small left pleural effusion. 6. Patchy ground-glass and airspace opacities in the left upper lobe worrisome for infection. 7. Moderate-sized pericardial effusion. 8. Cholelithiasis. 9. Nonobstructing left renal calculus   05/25/2022 Imaging   CT abdomen pelvis wo contrast 1. No acute findings or explanation for the patient's symptoms. 2. No evidence of primary malignancy or metastatic disease within the abdomen or pelvis on noncontrast imaging.  3. Cholelithiasis without evidence of cholecystitis or biliary dilatation. 4. Nonobstructing left renal calculus. 5. Moderate stool throughout the colon suggesting constipation. 6.  Aortic Atherosclerosis    05/26/2022 Initial Diagnosis   Malignant neoplasm of lung - TPS 90%, TMB 10, KRAS G12V  -05/22/2022 in the emergency room, she was found to have hypoxia with oxygen levels of 86% on room air, improved to 94 with 2 L of oxygen.  D-dimer was elevated at 1.29, troponin negative.  Lower extremity Doppler was negative for DVT.  VQ scan high probability PE (Large segmental perfusion defect of the left upper lobe without corresponding  radiographic abnormality. Additional bilateral wedge-shaped subsegmental branch perfusion defects .  Patient was admitted and started on heparin  Echocardiogram showed no RV strain.  Moderate pericardial effusion. Over her hospitalization, she continues to have shortness of breath with minimal exertion. 05/24/2022, CT chest angiogram showed left lung mass with bilateral lung nodules.   06/16/2022 Imaging   PET scan  1. 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 No evidence of metastatic disease in the abdomen or pelvis. 2. Moderate pericardial effusion. 3. Tiny left pleural effusion. 4. Left renal stone. 5. Aortic atherosclerosis (ICD10-I70.0). Coronary artery calcification. 6. 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    06/28/2022 Cancer Staging   Staging form: Lung, AJCC 8th Edition - Clinical: Stage IVA (cT4, cN3, cM1a) - Signed by Babara Call, MD on 06/28/2022   07/14/2022 -  Chemotherapy   Patient is on Treatment Plan : LUNG NSCLC Pembrolizumab  (200) q21d     09/29/2022 Imaging   CT chest abdomen pelvis w contrast showed Bilateral pulmonary nodules, some of which are waxing/waning, but favoring multifocal lung carcinoma/metastases.   Improving thoracic nodal metastases, as above.  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 Consider cystoscopy as clinically warranted.  Aortic Atherosclerosis (ICD10-I70.0) and Emphysema (ICD10-J43.9)   01/06/2023 Imaging   CT chest abdomen pelvis w contrast showed 1. Interval decrease in  size of multiple spiculated and subsolid pulmonary nodules, consistent with treatment response.  2. No significant change in matted post treatment prevascular and left superior mediastinal soft tissue. 3. Diminished size of an endoluminal bladder mass seen on prior examination, soft tissue in this vicinity now measuring no greater than 2.1 x 1.4 cm, previously at least 4.7 x 2.1 cm. Presumably this has been at least partially resected. 4. No evidence of lymphadenopathy or metastatic disease in the abdomen or pelvis. 5. Cholelithiasis. 6. Nonobstructive left nephrolithiasis. 7. Coronary artery disease.   01/10/2023 Procedure   Medi port placed by IR   Urothelial carcinoma of bladder (HCC)  10/06/2022 Initial Diagnosis   Urothelial carcinoma of bladder S/p TURBT  1. 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 Bladder, biopsy, Base of tumor HIGH GRADE PAPILLARY UROTHELIAL CARCINOMA SUBMUCOSA AND MUSCULARIS PROPRIA IS PRESENT AND NOT INVOLVED BY CARCINOMA  Postprocedure, patient developed gross hematuria and was admitted to the hospital.  She received PRBC transfusion.  Hemoglobin improved and she was discharged.    12/21/2022 Cancer Staging   Staging form: Urinary Bladder, AJCC 8th Edition - Clinical stage from 12/21/2022: Stage I (cT1, cN0, cM0) - Signed by Babara Call, MD on 12/29/2022 WHO/ISUP grade (low/high): High Grade Histologic grading system: 2 grade system     INTERVAL HISTORY Regina Perkins is a 83 y.o. female currently on keytruda  for NSCLC who returns to clinic for follow up and possible fluids. She received iv fluids previously. Has been drinking ample fluids over the weekend. Urinating regularly.    MEDICAL HISTORY:  Past Medical History:  Diagnosis Date    Atherosclerosis of native arteries of extremity with intermittent claudication (HCC)    Bladder mass    Chronic kidney disease, stage 3a (HCC)    COPD (chronic obstructive pulmonary disease) (HCC)    Diabetes mellitus without complication (HCC)    Gastrointestinal hemorrhage    GERD (gastroesophageal reflux disease)    Glaucoma    History of duodenal ulcer    HTN (hypertension)    Hypertension    Hypokalemia    Hypomagnesemia    Leukocytosis    Lung mass    Malignant neoplasm of lung (HCC) 06/28/2022   a.) stage IVA (cT4, cN3, cM1a) favor squamous cell carcinoma --> TPS 90%, TMB 10, KRAS G12V   Malnutrition (HCC)    Normocytic anemia    Pericardial effusion 05/2022   Pulmonary embolism (HCC)    Sepsis (HCC)    Tobacco abuse    Type II diabetes mellitus with renal manifestations (HCC)    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   a.) stage I (cT1, cN0, cM0)    SURGICAL HISTORY: Past Surgical History:  Procedure Laterality Date   ABDOMINAL HYSTERECTOMY     BACK SURGERY     1988 and 1998   CYSTOSCOPY W/ RETROGRADES Bilateral 12/21/2022   Procedure: CYSTOSCOPY WITH RETROGRADE PYELOGRAM;  Surgeon: Twylla Glendia BROCKS, MD;  Location: [facility] OR[facility];  Service: Urology;  Laterality: Bilateral;   CYSTOSCOPY WITH BIOPSY N/A 03/29/2023   Procedure: CYSTOSCOPY WITH BLADDER BIOPSY;  Surgeon: Twylla Glendia BROCKS, MD;  Location: [facility];  Service: Urology;  Laterality: N/A;   CYSTOSCOPY WITH STENT PLACEMENT Right 03/29/2023   Procedure: CYSTOSCOPY WITH STENT PLACEMENT;  Surgeon: Twylla Glendia BROCKS, MD;  Location: [facility];  Service: Urology;  Laterality: Right;   ESOPHAGOGASTRODUODENOSCOPY (EGD) WITH PROPOFOL  N/A 07/29/2022   Procedure: ESOPHAGOGASTRODUODENOSCOPY (EGD)  WITH PROPOFOL ;  Surgeon: Therisa Bi, MD;  Location[facility] ENDOSCOPY;  Service: Gastroenterology;  Laterality: N/A;   ESOPHAGOGASTRODUODENOSCOPY (EGD) WITH PROPOFOL  N/A 01/21/2023   Procedure: ESOPHAGOGASTRODUODENOSCOPY (EGD) WITH  PROPOFOL ;  Surgeon: Therisa Bi, MD;  Location: [facility] ENDOSCOPY;  Service: Gastroenterology;  Laterality: N/A;   HOLMIUM LASER APPLICATION Right 03/29/2023   Procedure: HOLMIUM LASER ABLATION;  Surgeon: Twylla Glendia BROCKS, MD;  Location: [facility];  Service: Urology;  Laterality: Right;   IR IMAGING GUIDED PORT INSERTION  01/10/2023   TRANSURETHRAL RESECTION OF BLADDER TUMOR N/A 12/21/2022   Procedure: TRANSURETHRAL RESECTION OF BLADDER TUMOR (TURBT);  Surgeon: Twylla Glendia BROCKS, MD;  Location: [facility];  Service: Urology;  Laterality: N/A;   TRANSURETHRAL RESECTION OF BLADDER TUMOR N/A 03/29/2023   Procedure: TRANSURETHRAL RESECTION OF BLADDER TUMOR (TURBT);  Surgeon: Twylla Glendia BROCKS, MD;  Location: [facility] ORS;  Service: Urology;  Laterality: N/A;   URETEROSCOPY Right 03/29/2023   Procedure: DIAGNOSTIC URETEROSCOPY;  Surgeon: Twylla Glendia BROCKS, MD;  Location: [facility] ORS;  Service: Urology;  Laterality: Right;    SOCIAL HISTORY: Social History   Socioeconomic History   Marital status: Widowed    Spouse name: Not on file   Number of children: Not on file   Years of education: Not on file   Highest education level: Not on file  Occupational History   Not on file  Tobacco Use   Smoking status: Every Day    Current packs/day: 0.25    Types: Cigarettes   Smokeless tobacco: Never  Vaping Use   Vaping status: Never Used  Substance and Sexual Activity   Alcohol use: No   Drug use: No   Sexual activity: Not on file  Other Topics Concern   Not on file  Social History Narrative   Not on file   Social Drivers of Health   Financial Resource Strain: Low Risk  (06/28/2022)   Overall Financial Resource Strain (CARDIA)    Difficulty of Paying Living Expenses: Not very hard  Food Insecurity: No Food Insecurity (12/22/2022)   Hunger Vital Sign    Worried About Running Out of Food in the Last Year: Never true    Ran Out of Food in the Last Year: Never true  Transportation Needs: No Transportation Needs  (12/22/2022)   PRAPARE - Administrator, Civil Service (Medical): No    Lack of Transportation (Non-Medical): No  Physical Activity: Not on file  Stress: No Stress Concern Present (06/28/2022)   [employer] of Occupational Health - Occupational Stress Questionnaire    Feeling of Stress : Not at all  Social Connections: Not on file  Intimate Partner Violence: Not At Risk (12/22/2022)   Humiliation, Afraid, Rape, and Kick questionnaire    Fear of Current or Ex-Partner: No    Emotionally Abused: No    Physically Abused: No    Sexually Abused: No    FAMILY HISTORY: Family History  Problem Relation Age of Onset   Asthma Mother    Heart disease Mother    Hypertension Mother    Diabetes Mother    Stroke Mother    Diabetes Sister    Diabetes Brother     ALLERGIES:  is allergic to ivp dye [iodinated contrast media] and shellfish allergy.  MEDICATIONS:  Current Outpatient Medications  Medication Sig Dispense Refill   albuterol  (VENTOLIN  HFA) 108 (90 Base) MCG/ACT inhaler Inhale 2 puffs into the lungs every 4 (four) hours as needed for wheezing or shortness of breath.  amLODipine  (NORVASC ) 10 MG tablet Take 10 mg by mouth every morning.     cyanocobalamin  (VITAMIN B12) 1000 MCG tablet Take 1 tablet (1,000 mcg total) by mouth daily. 90 tablet 0   lidocaine -prilocaine  (EMLA ) cream Apply 1 Application topically as needed. Apply to port and cover with saran wrap 1-2 hours prior to port access 30 g 1   lisinopril  (ZESTRIL ) 40 MG tablet Take 40 mg by mouth every morning.     metFORMIN  (GLUCOPHAGE ) 1000 MG tablet Take 1,000 mg by mouth daily with breakfast.     potassium chloride  (KLOR-CON ) 10 MEQ tablet Take 1 tablet (10 mEq total) by mouth daily. Be sure to take dose on day of surgery. Follow up with PCP or oncology for repeat labs. 12 tablet 0   trospium  (SANCTURA ) 20 MG tablet Take 1 tablet (20 mg total) by mouth 2 (two) times daily as needed (frequency,urgency,bladder  spasm). 60 tablet 0   No current facility-administered medications for this visit.    Review of Systems  Constitutional:  Negative for appetite change, chills, fatigue and fever.  HENT:   Negative for hearing loss and voice change.   Eyes:  Negative for eye problems.  Respiratory:  Positive for shortness of breath. Negative for chest tightness and cough.   Cardiovascular:  Negative for chest pain.  Gastrointestinal:  Negative for abdominal distention, abdominal pain and blood in stool.  Endocrine: Negative for hot flashes.  Genitourinary:  Negative for difficulty urinating and frequency.   Musculoskeletal:  Negative for arthralgias.  Skin:  Negative for itching and rash.  Neurological:  Negative for extremity weakness.  Hematological:  Negative for adenopathy.  Psychiatric/Behavioral:  Negative for confusion.      PHYSICAL EXAMINATION: ECOG PERFORMANCE STATUS: 2 - Symptomatic, <50% confined to bed  Vitals:   04/29/23 1344  BP: (!) 157/69  Pulse: 99  Temp: 98.4 F (36.9 C)  SpO2: 97%    Filed Weights   04/29/23 1344  Weight: 117 lb (53.1 kg)     Physical Exam Constitutional:      General: She is not in acute distress.    Appearance: She is not diaphoretic.  HENT:     Head: Normocephalic.  Eyes:     General: No scleral icterus.    Pupils: Pupils are equal, round, and reactive to light.  Cardiovascular:     Heart sounds: No murmur heard. Pulmonary:     Effort: Pulmonary effort is normal. No respiratory distress.     Comments: Decreased breath sounds bilaterally Abdominal:     General: There is no distension.  Musculoskeletal:        General: Normal range of motion.     Cervical back: Normal range of motion.  Skin:    Findings: No erythema.  Neurological:     Mental Status: She is alert and oriented to person, place, and time. Mental status is at baseline.     Cranial Nerves: No cranial nerve deficit.     Motor: No abnormal muscle tone.  Psychiatric:         Mood and Affect: Affect normal.      LABORATORY DATA:  I have reviewed the data as listed    Latest Ref Rng & Units 04/13/2023    8:54 AM 03/23/2023   12:38 PM 03/02/2023    8:12 AM  CBC  WBC 4.0 - 10.5 K/uL 13.4  5.8  5.9   Hemoglobin 12.0 - 15.0 g/dL 89.6  88.5  89.0   Hematocrit  36.0 - 46.0 % 30.8  35.6  33.6   Platelets 150 - 400 K/uL 357  239  227       Latest Ref Rng & Units 04/29/2023    1:37 PM 04/13/2023    8:54 AM 03/23/2023   12:38 PM  CMP  Glucose 70 - 99 mg/dL 779  819  897   BUN 8 - 23 mg/dL 16  30  19    Creatinine 0.44 - 1.00 mg/dL 9.07  8.82  9.15   Sodium 135 - 145 mmol/L 136  130  141   Potassium 3.5 - 5.1 mmol/L 3.2  3.4  3.5   Chloride 98 - 111 mmol/L 100  92  104   CO2 22 - 32 mmol/L 26  27  27    Calcium  8.9 - 10.3 mg/dL 8.8  8.7  9.1   Total Protein 6.5 - 8.1 g/dL  6.6  7.3   Total Bilirubin <1.2 mg/dL  0.6  0.4   Alkaline Phos 38 - 126 U/L  61  61   AST 15 - 41 U/L  19  33   ALT 0 - 44 U/L  14  24     RADIOGRAPHIC STUDIES: I have personally reviewed the radiological images as listed and agreed with the findings in the report. No results found.   ASSESSMENT & PLAN:   Non-small cell lung cancer, favor squamous cell carcinoma, stage IV- TPS 90%, TMB 10, KRAS G12V. MRI brain with and without contrast -  Left occipital focus of prior hemorrhage with mild linear contrast enhancement. Possible small metastasis or microhemmorhagic event. CT scan showed partial response. Brain lesion, repeat MRI in May 2024 shows no brain metastatic disease. Currently receiving Keytruda . Last on 04/13/23. Repeat CT in Jan 2025- not yet ordered. Defer to Dr. Babara.  Hypokalemia- K 3.2 today. Down.  Elevated serum creatinine- Improved. Continue oral hydration. Cr 0.92, Sodium 136. Hold fluids. Continue oral hydration. Provided oral rehydration recipes as she is tolerating oral fluids well.   Follow up with Dr Babara as scheduled.     Cancer Staging  Malignant neoplasm of lung  (HCC) Staging form: Lung, AJCC 8th Edition - Clinical: Stage IVA (cT4, cN3, cM1a) - Signed by Babara Call, MD on 06/28/2022  Urothelial carcinoma of bladder [facility]) Staging form: Urinary Bladder, AJCC 8th Edition - Clinical stage from 12/21/2022: Stage I (cT1, cN0, cM0) - Signed by Babara Call, MD on 12/29/2022  All questions were answered. The patient knows to call the clinic with any problems, questions or concerns.  Tinnie Dawn, DNP, AGNP-C, AOCNP [facility] [phone] (clinic)

## 2023-04-29 NOTE — Patient Instructions (Signed)
Oral Rehydration Solution (ORS) Recipes  Option 1 - 4  cups (1 liter) water +  teaspoon table salt + 6 level teaspoons sugar (World Health Organization's ORS recipe) - Provides: 1150 mg sodium, 25 grams sugar  Option 2 - 2 cups Gatorade + 2 cups water +  teaspoon salt - Provides: 1361 mg sodium, 24 grams sugar, 62 mg K  Option 3 - 3 cups water + 1 cup orange juice +  teaspoon salt +  teaspoon baking soda - Provides: 2355 mg sodium, 16 grams sugar, 362 mg K  Option 4 -  cup grape juice or cranberry juice + 3  cups water +  teaspoon salt - Provides: 1156-1159 mg sodium, 9-11 grams sugar, 50-90 mg K  Option 5 - 1 cup apple juice + 3 cups water +  teaspoon salt - Provides: 1160 mg sodium, 20 grams sugar, 194 mg K  Start by sipping a low volume of the mixture, 500-750 mL/day to gain acceptance to the taste, learn how to sip between meals and throughout the day all while ensuring consumption does not worsen dehydration by increasing stool volume. If you are not able to tolerate oral rehydration, notify clinic for reevaluation.

## 2023-05-04 ENCOUNTER — Inpatient Hospital Stay: Payer: Medicare Other

## 2023-05-04 ENCOUNTER — Encounter: Payer: Self-pay | Admitting: Oncology

## 2023-05-04 ENCOUNTER — Inpatient Hospital Stay (HOSPITAL_BASED_OUTPATIENT_CLINIC_OR_DEPARTMENT_OTHER): Payer: Medicare Other | Admitting: Oncology

## 2023-05-04 VITALS — BP 185/81 | HR 98 | Temp 96.1°F | Resp 18 | Wt 115.4 lb

## 2023-05-04 DIAGNOSIS — Z5112 Encounter for antineoplastic immunotherapy: Secondary | ICD-10-CM | POA: Diagnosis not present

## 2023-05-04 DIAGNOSIS — C679 Malignant neoplasm of bladder, unspecified: Secondary | ICD-10-CM

## 2023-05-04 DIAGNOSIS — D649 Anemia, unspecified: Secondary | ICD-10-CM

## 2023-05-04 DIAGNOSIS — E876 Hypokalemia: Secondary | ICD-10-CM | POA: Diagnosis not present

## 2023-05-04 DIAGNOSIS — C3482 Malignant neoplasm of overlapping sites of left bronchus and lung: Secondary | ICD-10-CM

## 2023-05-04 LAB — CMP (CANCER CENTER ONLY)
ALT: 16 U/L (ref 0–44)
AST: 19 U/L (ref 15–41)
Albumin: 3.4 g/dL — ABNORMAL LOW (ref 3.5–5.0)
Alkaline Phosphatase: 64 U/L (ref 38–126)
Anion gap: 10 (ref 5–15)
BUN: 9 mg/dL (ref 8–23)
CO2: 27 mmol/L (ref 22–32)
Calcium: 8.9 mg/dL (ref 8.9–10.3)
Chloride: 101 mmol/L (ref 98–111)
Creatinine: 0.79 mg/dL (ref 0.44–1.00)
GFR, Estimated: >60 mL/min (ref >60)
Glucose, Bld: 163 mg/dL — ABNORMAL HIGH (ref 70–99)
Potassium: 3.1 mmol/L — ABNORMAL LOW (ref 3.5–5.1)
Sodium: 138 mmol/L (ref 135–145)
Total Bilirubin: 0.4 mg/dL (ref 0.0–1.2)
Total Protein: 6.8 g/dL (ref 6.5–8.1)

## 2023-05-04 LAB — CBC WITH DIFFERENTIAL (CANCER CENTER ONLY)
Abs Immature Granulocytes: 0.02 10*3/uL (ref 0.00–0.07)
Basophils Absolute: 0 10*3/uL (ref 0.0–0.1)
Basophils Relative: 0 %
Eosinophils Absolute: 0.2 10*3/uL (ref 0.0–0.5)
Eosinophils Relative: 3 %
HCT: 31.2 % — ABNORMAL LOW (ref 36.0–46.0)
Hemoglobin: 10 g/dL — ABNORMAL LOW (ref 12.0–15.0)
Immature Granulocytes: 0 %
Lymphocytes Relative: 19 %
Lymphs Abs: 1.2 10*3/uL (ref 0.7–4.0)
MCH: 28.4 pg (ref 26.0–34.0)
MCHC: 32.1 g/dL (ref 30.0–36.0)
MCV: 88.6 fL (ref 80.0–100.0)
Monocytes Absolute: 0.6 10*3/uL (ref 0.1–1.0)
Monocytes Relative: 9 %
Neutro Abs: 4.4 10*3/uL (ref 1.7–7.7)
Neutrophils Relative %: 69 %
Platelet Count: 349 10*3/uL (ref 150–400)
RBC: 3.52 MIL/uL — ABNORMAL LOW (ref 3.87–5.11)
RDW: 16.7 % — ABNORMAL HIGH (ref 11.5–15.5)
WBC Count: 6.4 10*3/uL (ref 4.0–10.5)
nRBC: 0 % (ref 0.0–0.2)

## 2023-05-04 LAB — TSH: TSH: 0.867 u[IU]/mL (ref 0.350–4.500)

## 2023-05-04 MED ORDER — POTASSIUM CHLORIDE ER 10 MEQ PO TBCR: 10.0000 meq | EXTENDED_RELEASE_TABLET | Freq: Every day | ORAL | 1 refills | Status: DC

## 2023-05-04 MED ORDER — SODIUM CHLORIDE 0.9% FLUSH
10.0000 mL | INTRAVENOUS | Status: DC | PRN
  Administered 2023-05-04: 10 mL
  Filled 2023-05-04: qty 10

## 2023-05-04 MED ORDER — PREDNISONE 50 MG PO TABS: 50.0000 mg | ORAL_TABLET | ORAL | 0 refills | Status: DC

## 2023-05-04 MED ORDER — SODIUM CHLORIDE 0.9 % IV SOLN
Freq: Once | INTRAVENOUS | Status: AC
  Filled 2023-05-04: qty 250

## 2023-05-04 MED ORDER — SODIUM CHLORIDE 0.9 % IV SOLN
200.0000 mg | Freq: Once | INTRAVENOUS | Status: AC
  Administered 2023-05-04: 200 mg via INTRAVENOUS
  Filled 2023-05-04: qty 200

## 2023-05-04 MED ORDER — HEPARIN SOD (PORK) LOCK FLUSH 100 UNIT/ML IV SOLN
500.0000 [IU] | Freq: Once | INTRAVENOUS | Status: AC | PRN
Start: 2023-05-04 — End: 2023-05-04
  Administered 2023-05-04: 500 [IU]
  Filled 2023-05-04: qty 5

## 2023-05-04 NOTE — Assessment & Plan Note (Signed)
 Immunotherapy treatment plan as listed above.

## 2023-05-04 NOTE — Assessment & Plan Note (Signed)
 Chronic anemia.  Hemoglobin is stable

## 2023-05-04 NOTE — Assessment & Plan Note (Signed)
 12/21/2022 Non-muscle invasive bladder cancer. S/p TURBT, T1 lesion, high grade.  03/29/2023 Non-muscle invasive bladder cancer. S/p TURBT follow-up with urology, there is plan for intravesical BCG treatments

## 2023-05-04 NOTE — Progress Notes (Signed)
 Nutrition Follow-up:  Patient with stage IV lung cancer.  S/p TURBT for urothelial carcinoma of bladder.  Noted transuretheral resection of bladder tumor, cystoscopy on 12/3.  Patient receiving keytruda   Met with patient during infusion.  Reports that she was with her nephew in [place] for 3 weeks and eating the whole time.  He had a whole table of food for me and he would bring it to me.  Eating 3 meals a day and snacks.  Drinking less ensure shakes. Says that she stopped her metformin  because she saw where it made you loose weight.      Medications: reviewed  Labs: K 3.1, glucose 163  Anthropometrics:   Weight 115 lb 6.4 oz today  113 lb 6.4 oz on 12/18 114 lb 14.4 oz on 9/25 116 lb 6.4 oz on 8/14 115 lb on 6/12 118 lb on 5/1 116 lb on 4/9 115 lb on 3/15   NUTRITION DIAGNOSIS: Inadequate oral intake improving    INTERVENTION:  Encouraged patient not to stop metformin  without discussing with PCP.  She verbalized understanding Reviewed foods high in potassium Continue high calorie, high protein foods to prevent weight loss    MONITORING, EVALUATION, GOAL: weight trends, intake   NEXT VISIT: as needed  Semaj Coburn B. Dasie, RD, LDN Registered Dietitian [phone]

## 2023-05-04 NOTE — Progress Notes (Signed)
 Hematology/Oncology Progress note [phone] [phone]       REASON OF VISIT Stage IV Lung squamous cell carcinoma  ASSESSMENT & PLAN:   Cancer Staging  Malignant neoplasm of lung (HCC) Staging form: Lung, AJCC 8th Edition - Clinical: Stage IVA (cT4, cN3, cM1a) - Signed by Babara Call, MD on 06/28/2022  Urothelial carcinoma of bladder [facility]) Staging form: Urinary Bladder, AJCC 8th Edition - Clinical stage from 12/21/2022: Stage I (cT1, cN0, cM0) - Signed by Babara Call, MD on 12/29/2022   Malignant neoplasm of lung (HCC) Non-small cell lung cancer, favor squamous cell carcinoma, stage IV- TPS 90%, TMB 10, KRAS G12V MRI brain with and without contrast -  Left occipital focus of prior hemorrhage with mild linear contrast enhancement. Possible small metastasis or microhemmorhagic event.  TPS 90%, CT scan showed partial response. .Labs are reviewed and discussed with patient.  Proceed with Keytruda  Repeat CT in Jan 2025  Brain lesion, repeat MRI in June 2024 shows no brain metastatic disease.   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 S/p TURBT, T1 lesion, high grade.  03/29/2023 Non-muscle invasive bladder cancer. S/p TURBT follow-up with urology, there is plan for intravesical BCG treatments  Encounter for antineoplastic immunotherapy Immunotherapy treatment plan as listed above.  Hypokalemia Recommend patient to take potassium chloride  20meq daily x 3 days followed by 10meq daily   Normocytic anemia Chronic anemia.  Hemoglobin is stable   No orders of the defined types were placed in this encounter.  Follow up in 3 weeks.   All questions were answered. The patient knows to call the clinic with any problems, questions or concerns.  Call Babara, MD, PhD [facility] Hematology Oncology 05/04/2023       HISTORY OF PRESENTING ILLNESS:  Regina Perkins 83 y.o. 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 I have reviewed her chart and materials related to her cancer extensively and collaborated history with the patient. Summary of oncologic history is as follows: Oncology History  Malignant neoplasm of lung (HCC)  05/24/2022 Imaging   CT chest angiogram showed 1. No evidence for pulmonary embolism. 2. Left upper lobe/prevascular mass worrisome for neoplasm. 3. Mediastinal and hilar lymphadenopathy. 4. Multiple pulmonary nodules measuring up to 11 mm worrisome for metastatic disease. 5. Small left pleural effusion. 6. Patchy ground-glass and airspace opacities in the left upper lobe worrisome for infection. 7. Moderate-sized pericardial effusion. 8. Cholelithiasis. 9. Nonobstructing left renal calculus   05/25/2022 Imaging   CT abdomen pelvis wo contrast 1. No acute findings or explanation for the patient's symptoms. 2. No evidence of primary malignancy or metastatic disease within the abdomen or pelvis on noncontrast imaging.  3. Cholelithiasis without evidence of cholecystitis or biliary dilatation. 4. Nonobstructing left renal calculus. 5. Moderate stool throughout the colon suggesting constipation. 6.  Aortic Atherosclerosis    05/26/2022 Initial Diagnosis   Malignant neoplasm of lung - TPS 90%, TMB 10, KRAS G12V  -05/22/2022 in the emergency room, she was found to have hypoxia with oxygen levels of 86% on room air, improved to 94 with 2 L of oxygen.  D-dimer was elevated at 1.29, troponin negative.  Lower extremity Doppler was negative for DVT.  VQ scan high probability PE (Large segmental perfusion defect of the left upper lobe without corresponding radiographic abnormality. Additional bilateral wedge-shaped subsegmental branch perfusion defects .  Patient was admitted and started on heparin  Echocardiogram  showed no RV strain.  Moderate pericardial effusion. Over her hospitalization, she continues  to have shortness of breath with minimal exertion. 05/24/2022, CT chest angiogram showed left lung mass with bilateral lung nodules.   06/16/2022 Imaging   PET scan  1. 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 No evidence of metastatic disease in the abdomen or pelvis. 2. Moderate pericardial effusion. 3. Tiny left pleural effusion. 4. Left renal stone. 5. Aortic atherosclerosis (ICD10-I70.0). Coronary artery calcification. 6. 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    06/28/2022 Cancer Staging   Staging form: Lung, AJCC 8th Edition - Clinical: Stage IVA (cT4, cN3, cM1a) - Signed by Babara Call, MD on 06/28/2022   07/14/2022 -  Chemotherapy   Patient is on Treatment Plan : LUNG NSCLC Pembrolizumab  (200) q21d     09/29/2022 Imaging   CT chest abdomen pelvis w contrast showed Bilateral pulmonary nodules, some of which are waxing/waning, but favoring multifocal lung carcinoma/metastases.   Improving thoracic nodal metastases, as above.  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 Consider cystoscopy as clinically warranted.  Aortic Atherosclerosis (ICD10-I70.0) and Emphysema (ICD10-J43.9)   01/06/2023 Imaging   CT chest abdomen pelvis w contrast showed 1. Interval decrease in size of multiple spiculated and subsolid pulmonary nodules, consistent with treatment response. 2. No significant change in matted post treatment prevascular and left superior mediastinal soft tissue. 3. Diminished size of an endoluminal bladder mass  seen on prior examination, soft tissue in this vicinity now measuring no greater than 2.1 x 1.4 cm, previously at least 4.7 x 2.1 cm. Presumably this has been at least partially resected. 4. No evidence of lymphadenopathy or metastatic disease in the abdomen or pelvis. 5. Cholelithiasis. 6. Nonobstructive left nephrolithiasis. 7. Coronary artery disease.   01/10/2023 Procedure   Medi port placed by IR   Urothelial carcinoma of bladder (HCC)  10/06/2022 Initial Diagnosis   Urothelial carcinoma of bladder S/p TURBT  1. 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 Bladder, biopsy, Base of tumor HIGH GRADE PAPILLARY UROTHELIAL CARCINOMA SUBMUCOSA AND MUSCULARIS PROPRIA IS PRESENT AND NOT INVOLVED BY CARCINOMA  Postprocedure, patient developed gross hematuria and was admitted to the hospital.  She received PRBC transfusion.  Hemoglobin improved and she was discharged.    12/21/2022 Cancer Staging   Staging form: Urinary Bladder, AJCC 8th Edition - Clinical stage from 12/21/2022: Stage I (cT1, cN0, cM0) - Signed by Babara Call, MD on 12/29/2022 WHO/ISUP grade (low/high): High Grade Histologic grading system: 2 grade system     INTERVAL HISTORY Regina Perkins is a 83 y.o. female who has above history reviewed by me today presents for follow up visit for Lung squamous cell carcinoma She tolerates keytuda well. Denies skin rash, diarrhea.  chronic SOB with exertion, improved she has been using her inhalers.  She reports feeling cold chronically. No fever or chills. On Ceftin  for klebsiella UTI Recent cystoscopy, non invasive high grade papillary urothelial carcinomas s/p TURBT, there is plan for intravesical BCGs BP was high  in clinic. She missed BP meds. Denies headache, focal  weakness  MEDICAL HISTORY:  Past Medical History:  Diagnosis Date   Atherosclerosis of native arteries of extremity with intermittent claudication (HCC)    Bladder mass    Chronic kidney disease, stage 3a (HCC)    COPD (chronic obstructive pulmonary disease) (HCC)    Diabetes mellitus without complication (HCC)    Gastrointestinal hemorrhage    GERD (gastroesophageal reflux disease)    Glaucoma    History of duodenal ulcer    HTN (hypertension)    Hypertension    Hypokalemia    Hypomagnesemia    Leukocytosis    Lung mass    Malignant neoplasm of lung (HCC) 06/28/2022   a.) stage IVA (cT4, cN3, cM1a) favor squamous cell carcinoma --> TPS 90%, TMB 10, KRAS G12V   Malnutrition (HCC)    Normocytic anemia    Pericardial effusion 05/2022   Pulmonary embolism (HCC)    Sepsis (HCC)    Tobacco abuse    Type II diabetes mellitus with renal manifestations (HCC)    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   a.) stage I (cT1, cN0, cM0)    SURGICAL HISTORY: Past Surgical History:  Procedure Laterality Date   ABDOMINAL HYSTERECTOMY     BACK SURGERY     1988 and 1998   CYSTOSCOPY W/ RETROGRADES Bilateral 12/21/2022   Procedure: CYSTOSCOPY WITH RETROGRADE PYELOGRAM;  Surgeon: Twylla Glendia BROCKS, MD;  Location: [facility];  Service: Urology;  Laterality: Bilateral;   CYSTOSCOPY WITH BIOPSY N/A 03/29/2023   Procedure: CYSTOSCOPY WITH BLADDER BIOPSY;  Surgeon: Twylla Glendia BROCKS, MD;  Location: [facility];  Service: Urology;  Laterality: N/A;   CYSTOSCOPY WITH STENT PLACEMENT Right 03/29/2023   Procedure: CYSTOSCOPY WITH STENT PLACEMENT;  Surgeon: Twylla Glendia BROCKS, MD;  Location: [facility];  Service: Urology;  Laterality: Right;   ESOPHAGOGASTRODUODENOSCOPY (EGD) WITH PROPOFOL  N/A 07/29/2022   Procedure: ESOPHAGOGASTRODUODENOSCOPY (EGD) WITH PROPOFOL ;  Surgeon: Therisa Bi, MD;  Location: [facility] ENDOSCOPY;  Service: Gastroenterology;  Laterality: N/A;   ESOPHAGOGASTRODUODENOSCOPY (EGD) WITH  PROPOFOL  N/A 01/21/2023   Procedure: ESOPHAGOGASTRODUODENOSCOPY (EGD) WITH PROPOFOL ;  Surgeon: Therisa Bi, MD;  Location: [facility] END[facility]COPY;  Service: Gastroenterology;  Laterality: N/A;   HOLMIUM LASER APPLICATION Right 03/29/2023   Procedure: HOLMIUM LASER ABLATION;  Surgeon: Twylla Glendia BROCKS, MD;  Location: [facility];  Service: Urology;  Laterality: Right;   IR IMAGING GUIDED PORT INSERTION  01/10/2023   TRANSURETHRAL RESECTION OF BLADDER TUMOR N/A 12/21/2022   Procedure: TRANSURETHRAL RESECTION OF BLADDER TUMOR (TURBT);  Surgeon: Twylla Glendia BROCKS, MD;  Location: [facility];  Service: Urology;  Laterality: N/A;   TRANSURETHRAL RESECTION OF BLADDER TUMOR N/A 03/29/2023   Procedure: TRANSURETHRAL RESECTION OF BLADDER TUMOR (TURBT);  Surgeon: Twylla Glendia BROCKS, MD;  Location: [facility];  Service: Urology;  Laterality: N/A;   URETEROSCOPY Right 03/29/2023   Procedure: DIAGNOSTIC URETEROSCOPY;  Surgeon: Twylla Glendia BROCKS, MD;  Location: [facility];  Service: Urology;  Laterality: Right;    SOCIAL HISTORY: Social History   Socioeconomic History   Marital status: Widowed    Spouse name: Not on file   Number of children: Not on file   Years of education: Not on file   Highest education level: Not on file  Occupational History   Not on file  Tobacco Use   Smoking status: Every Day    Current packs/day: 0.25    Types: Cigarettes   Smokeless tobacco: Never  Vaping Use   Vaping status:  Never Used  Substance and Sexual Activity   Alcohol use: No   Drug use: No   Sexual activity: Not on file  Other Topics Concern   Not on file  Social History Narrative   Not on file   Social Drivers of Health   Financial Resource Strain: Low Risk  (06/28/2022)   Overall Financial Resource Strain (CARDIA)    Difficulty of Paying Living Expenses: Not very hard  Food Insecurity: No Food Insecurity (12/22/2022)   Hunger Vital Sign    Worried About Running Out of Food in the Last Year: Never true    Ran Out of Food in  the Last Year: Never true  Transportation Needs: No Transportation Needs (12/22/2022)   PRAPARE - Administrator, Civil Service (Medical): No    Lack of Transportation (Non-Medical): No  Physical Activity: Not on file  Stress: No Stress Concern Present (06/28/2022)   [employer] of Occupational Health - Occupational Stress Questionnaire    Feeling of Stress : Not at all  Social Connections: Not on file  Intimate Partner Violence: Not At Risk (12/22/2022)   Humiliation, Afraid, Rape, and Kick questionnaire    Fear of Current or Ex-Partner: No    Emotionally Abused: No    Physically Abused: No    Sexually Abused: No    FAMILY HISTORY: Family History  Problem Relation Age of Onset   Asthma Mother    Heart disease Mother    Hypertension Mother    Diabetes Mother    Stroke Mother    Diabetes Sister    Diabetes Brother     ALLERGIES:  is allergic to ivp dye [iodinated contrast media] and shellfish allergy.  MEDICATIONS:  Current Outpatient Medications  Medication Sig Dispense Refill   albuterol  (VENTOLIN  HFA) 108 (90 Base) MCG/ACT inhaler Inhale 2 puffs into the lungs every 4 (four) hours as needed for wheezing or shortness of breath.     amLODipine  (NORVASC ) 10 MG tablet Take 10 mg by mouth every morning.     cyanocobalamin  (VITAMIN B12) 1000 MCG tablet Take 1 tablet (1,000 mcg total) by mouth daily. 90 tablet 0   lidocaine -prilocaine  (EMLA ) cream Apply 1 Application topically as needed. Apply to port and cover with saran wrap 1-2 hours prior to port access 30 g 1   lisinopril  (ZESTRIL ) 40 MG tablet Take 40 mg by mouth every morning.     metFORMIN  (GLUCOPHAGE ) 1000 MG tablet Take 1,000 mg by mouth daily with breakfast.     trospium  (SANCTURA ) 20 MG tablet Take 1 tablet (20 mg total) by mouth 2 (two) times daily as needed (frequency,urgency,bladder spasm). 60 tablet 0   potassium chloride  (KLOR-CON ) 10 MEQ tablet Take 1 tablet (10 mEq total) by mouth daily. Be sure to  take dose on day of surgery. Follow up with PCP or oncology for repeat labs. 30 tablet 1   No current facility-administered medications for this visit.    Review of Systems  Constitutional:  Negative for appetite change, chills, fatigue and fever.  HENT:   Negative for hearing loss and voice change.   Eyes:  Negative for eye problems.  Respiratory:  Positive for shortness of breath. Negative for chest tightness and cough.   Cardiovascular:  Negative for chest pain.  Gastrointestinal:  Negative for abdominal distention, abdominal pain and blood in stool.  Endocrine: Negative for hot flashes.  Genitourinary:  Negative for difficulty urinating and frequency.   Musculoskeletal:  Negative for arthralgias.  Skin:  Negative for itching and rash.  Neurological:  Negative for extremity weakness.  Hematological:  Negative for adenopathy.  Psychiatric/Behavioral:  Negative for confusion.      PHYSICAL EXAMINATION: ECOG PERFORMANCE STATUS: 2 - Symptomatic, <50% confined to bed  Vitals:   05/04/23 0921 05/04/23 0932  BP: (!) 181/85 (!) 185/81  Pulse: 98   Resp: 18   Temp: (!) 96.1 F (35.6 C)     Filed Weights   05/04/23 0921  Weight: 115 lb 6.4 oz (52.3 kg)     Physical Exam Constitutional:      General: She is not in acute distress.    Appearance: She is not diaphoretic.  HENT:     Head: Normocephalic.  Eyes:     General: No scleral icterus.    Pupils: Pupils are equal, round, and reactive to light.  Cardiovascular:     Heart sounds: Murmur heard.  Pulmonary:     Effort: Pulmonary effort is normal. No respiratory distress.     Comments: Decreased breath sounds bilaterally Abdominal:     General: There is no distension.  Musculoskeletal:        General: Normal range of motion.     Cervical back: Normal range of motion.  Skin:    Findings: No erythema.  Neurological:     Mental Status: She is alert and oriented to person, place, and time. Mental status is at baseline.      Cranial Nerves: No cranial nerve deficit.     Motor: No abnormal muscle tone.  Psychiatric:        Mood and Affect: Affect normal.      LABORATORY DATA:  I have reviewed the data as listed    Latest Ref Rng & Units 05/04/2023    8:36 AM 04/13/2023    8:54 AM 03/23/2023   12:38 PM  CBC  WBC 4.0 - 10.5 K/uL 6.4  13.4  5.8   Hemoglobin 12.0 - 15.0 g/dL 89.9  89.6  88.5   Hematocrit 36.0 - 46.0 % 31.2  30.8  35.6   Platelets 150 - 400 K/uL 349  357  239       Latest Ref Rng & Units 05/04/2023    8:36 AM 04/29/2023    1:37 PM 04/13/2023    8:54 AM  CMP  Glucose 70 - 99 mg/dL 836  779  819   BUN 8 - 23 mg/dL 9  16  30    Creatinine 0.44 - 1.00 mg/dL 9.20  9.07  8.82   Sodium 135 - 145 mmol/L 138  136  130   Potassium 3.5 - 5.1 mmol/L 3.1  3.2  3.4   Chloride 98 - 111 mmol/L 101  100  92   CO2 22 - 32 mmol/L 27  26  27    Calcium  8.9 - 10.3 mg/dL 8.9  8.8  8.7   Total Protein 6.5 - 8.1 g/dL 6.8   6.6   Total Bilirubin 0.0 - 1.2 mg/dL 0.4   0.6   Alkaline Phos 38 - 126 U/L 64   61   AST 15 - 41 U/L 19   19   ALT 0 - 44 U/L 16   14      RADIOGRAPHIC STUDIES: I have personally reviewed the radiological images as listed and agreed with the findings in the report. No results found.

## 2023-05-04 NOTE — Patient Instructions (Signed)
Instructions for Dye/ contrast allergy  Prednisone - 50 mg by mouth at 13 hours, 7 hours, and 1 hour before contrast media injection (CT scan) Diphenhydramine (Benadryl) - 50 mg by mouth 1 hour before contrast medium

## 2023-05-04 NOTE — Assessment & Plan Note (Signed)
 Recommend patient to take potassium chloride daily x 3 days followed by daily

## 2023-05-04 NOTE — Assessment & Plan Note (Addendum)
 Non-small cell lung cancer, favor squamous cell carcinoma, stage IV- TPS 90%, TMB 10, KRAS G12V MRI brain with and without contrast -  Left occipital focus of prior hemorrhage with mild linear contrast enhancement. Possible small metastasis or microhemmorhagic event.  TPS 90%, CT scan showed partial response. .Labs are reviewed and discussed with patient.  Proceed with Keytruda  Repeat CT in Jan 2025  Brain lesion, repeat MRI in June 2024 shows no brain metastatic disease.

## 2023-05-05 LAB — T4: T4, Total: 9.6 ug/dL (ref 4.5–12.0)

## 2023-05-09 ENCOUNTER — Ambulatory Visit (INDEPENDENT_AMBULATORY_CARE_PROVIDER_SITE_OTHER): Payer: Medicare Other | Admitting: Physician Assistant

## 2023-05-09 DIAGNOSIS — C679 Malignant neoplasm of bladder, unspecified: Secondary | ICD-10-CM

## 2023-05-09 DIAGNOSIS — D494 Neoplasm of unspecified behavior of bladder: Secondary | ICD-10-CM

## 2023-05-09 LAB — URINALYSIS, COMPLETE
Bilirubin, UA: NEGATIVE
Glucose, UA: NEGATIVE
Ketones, UA: NEGATIVE
Nitrite, UA: NEGATIVE
Specific Gravity, UA: 1.015 (ref 1.005–1.030)
Urobilinogen, Ur: 0.2 mg/dL (ref 0.2–1.0)
pH, UA: 7 (ref 5.0–7.5)

## 2023-05-09 LAB — MICROSCOPIC EXAMINATION: WBC, UA: >30 /[HPF] — AB (ref 0–5)

## 2023-05-09 MED ORDER — BCG LIVE 50 MG IS SUSR
3.2400 mL | Freq: Once | INTRAVESICAL | Status: AC
  Administered 2023-05-09: 81 mg via INTRAVESICAL

## 2023-05-09 NOTE — Progress Notes (Signed)
 BCG Bladder Instillation  BCG # 1 of 6  Due to Bladder Cancer patient is present today for a BCG treatment. Patient was cleaned and prepped in a sterile fashion with betadine. A 14FR catheter was inserted, urine return was noted 25ml, urine was yellow in color.  50ml of reconstituted BCG was instilled into the bladder. The catheter was then removed. Patient tolerated well, no complications were noted  Performed by: Vernard Gram, PA-C   Additional notes: Patient remained in clinic for 30 minutes following instillation today for monitoring of hypersensitivity reaction; none noted.  We reviewed post-instillation instructions including holding the urine for 2 hours with quarter turns every 15 minutes and pouring bleach into the toilet with subsequent voids for 6 hours. Written instructions also provided today. She expressed understanding.   Follow up: 1 week

## 2023-05-09 NOTE — Patient Instructions (Signed)
 Your Timeline for Today:  Right now through 5:45pm: Hold your urine and do your quarter turns every 15 minutes. 5:45pm-11:45pm today: Every time you urinate, pour 1/2 cup of bleach into the toilet and let it sit for 15 minutes prior to flushing. 11:45pm onward: Resume your normal routine.   Patient Education: (BCG) Into the Bladder (Intravesical Chemotherapy)  BCG is a vaccine which is used to prevent tuberculosis (TB).  But it's also a helpful treatment for some early bladder cancers.  When BCG goes directly into the bladder the treatment is described as intravesical.  BCG is a type of immunotherapy.  Immunotherapy stimulates the body's immune system to destroy cancer cells.  How it's given BCG treatment is given to you in an outpatient setting.  It takes a few minutes to administer and you can go home as soon as it's finished.  It might be a good idea to ask someone to bring you, particularly the fist time.  Unlike chemotherapy into the bladder, BCG treatment is never given immediately after surgery to remove bladder tumors.  There needs to be a delay usually of at least two weeks after surgery, before you can have it.  You won't be given treatment with BCG if you are unwell or have an infection in your urine.  You're usually asked to limit the amount you drink before your treatment.  This will help to increase the concentration of BCG in your bladder.  Drinking too much before your treatment may make your bladder feel uncomfortably full.  If you normally take water tablets (diuretics) take them later in the day after your treatment.  Your nurse or doctor will give you more advise about preparing for your treatment.  You will have a small tube (catheter) placed into your bladder.  Your doctor will then put the liquid vaccine directly into your bladder through the catheter and remove the catheter.  You will need to hold your urine for two hours afterwards.  Rotating every 15 minutes from side to  side. This can be difficult but it's to give the treatment time to work.  When the treatment is over you can go to the toilet.  After your treatment there are some precautions you'll need to take.  This is because BCG is a live vaccine and other people shouldn't be exposed to it.  For the next six hours, you'll need to avoid your urine splashing on the toilet seat and getting any urine on your hands.  It might be easer for men to sit down when they're using an ordinary toilet although using a stand up urinal should be alright.  The main this is to avoid splashing urine and spreading the vaccine.  You will also be asked to put 1/2 cup undiluted bleach into the toilet to destroy any live vaccine and leave it for 15 minutes until you flush for the next 6 voids.  Side Effects Because BCG goes directly into the bladder most of the side effects are linked with the bladder.  They usually go away within one to two days after your treatment.  The most common ones are: -needing to pass urine often -pain when you pass urine -blood in urine -flu-like symptoms (tiredness, general aching and raised temperature)  Theses side effects should settle down within a day or two.  If they don't get better contact your doctor.  Drinking lots of fluids can help flush the drug out of your bladder and reduce some of these effects.  Taking Ibuprofen  or Aleve is encouraged unless you have a condition that would make these medications unsafe to take (renal failure, diabetes, gerd)  Rare side effects can include a continuing high temperature (fever), pain in your joints and a cough.  If you have any of these symptoms, or if you feel generally unwell, contact your doctor.  These symptoms could be a sign of a more serious infection (due to BCG) that needs to be treated immediately.  If this happens you'll be treated with the same drugs (antibiotics) that are used to treat TB.  Contraception Men should use a condom during sex for  the first 48 hours after their treatment.  If you are a women who has had BCG treatment then your partner should use a condom.  Using a condom will protect your partner from any vaccine present in your semen or vaginal fluid.  We don't know how BCG may affect a developing fetus so it's not advisable to become pregnant or father a child while having it.  It is important to use effective contraception during your treatment and for six weeks afterwards.  You can discuss this with your doctor or specialist nurse.

## 2023-05-11 ENCOUNTER — Ambulatory Visit: Admission: RE | Admit: 2023-05-11 | Payer: Medicare Other | Source: Ambulatory Visit

## 2023-05-11 ENCOUNTER — Ambulatory Visit
Admission: RE | Admit: 2023-05-11 | Discharge: 2023-05-11 | Disposition: A | Discharge status: Home / Self Care | Payer: Medicare Other | Source: Ambulatory Visit | Attending: Oncology | Admitting: Oncology

## 2023-05-11 DIAGNOSIS — C3482 Malignant neoplasm of overlapping sites of left bronchus and lung: Secondary | ICD-10-CM | POA: Insufficient documentation

## 2023-05-11 LAB — CULTURE, URINE COMPREHENSIVE

## 2023-05-11 MED ORDER — IOHEXOL 300 MG/ML  SOLN
75.0000 mL | Freq: Once | INTRAMUSCULAR | Status: AC | PRN
  Administered 2023-05-11: 75 mL via INTRAVENOUS

## 2023-05-11 MED ORDER — HEPARIN SOD (PORK) LOCK FLUSH 100 UNIT/ML IV SOLN
INTRAVENOUS | Status: AC
  Filled 2023-05-11: qty 5

## 2023-05-11 MED ORDER — HEPARIN SOD (PORK) LOCK FLUSH 100 UNIT/ML IV SOLN
500.0000 [IU] | Freq: Once | INTRAVENOUS | Status: AC
  Administered 2023-05-11: 500 [IU] via INTRAVENOUS
  Filled 2023-05-11: qty 5

## 2023-05-16 ENCOUNTER — Ambulatory Visit (INDEPENDENT_AMBULATORY_CARE_PROVIDER_SITE_OTHER): Payer: Medicare Other | Admitting: Physician Assistant

## 2023-05-16 VITALS — BP 171/80 | HR 109

## 2023-05-16 DIAGNOSIS — C679 Malignant neoplasm of bladder, unspecified: Secondary | ICD-10-CM | POA: Diagnosis not present

## 2023-05-16 LAB — URINALYSIS, COMPLETE
Bilirubin, UA: NEGATIVE
Glucose, UA: NEGATIVE
Nitrite, UA: NEGATIVE
Specific Gravity, UA: 1.025 (ref 1.005–1.030)
Urobilinogen, Ur: 0.2 mg/dL (ref 0.2–1.0)
pH, UA: 6 (ref 5.0–7.5)

## 2023-05-16 LAB — MICROSCOPIC EXAMINATION
Epithelial Cells (non renal): >10 /[HPF] — AB (ref 0–10)
RBC, Urine: >30 /[HPF] — AB (ref 0–2)
WBC, UA: >30 /[HPF] — AB (ref 0–5)

## 2023-05-16 MED ORDER — BCG LIVE 50 MG IS SUSR
3.2400 mL | Freq: Once | INTRAVESICAL | Status: AC
  Administered 2023-05-16: 81 mg via INTRAVESICAL

## 2023-05-16 NOTE — Progress Notes (Signed)
BCG Bladder Instillation  BCG # 2 of 6  Due to Bladder Cancer patient is present today for a BCG treatment. Patient was cleaned and prepped in a sterile fashion with betadine. A 14FR catheter was inserted, urine return was noted 25ml, urine was yellow in color.  50ml of reconstituted BCG was instilled into the bladder. The catheter was then removed. Patient tolerated well, no complications were noted  Performed by: Carman Ching, PA-C and Domingo Cocking, CMA  Follow up/ Additional notes: 1 week

## 2023-05-16 NOTE — Patient Instructions (Signed)
Your Timeline for Today:  Right now through 5:20pm: Hold your urine and do your quarter turns every 15 minutes. 5:20pm-11:20pm today: Every time you urinate, pour 1/2 cup of bleach into the toilet and let it sit for 15 minutes prior to flushing. 11:20pm onward: Resume your normal routine.

## 2023-05-23 ENCOUNTER — Ambulatory Visit: Payer: Medicare Other | Admitting: Physician Assistant

## 2023-05-23 VITALS — BP 163/73 | HR 105 | Ht 63.0 in | Wt 115.2 lb

## 2023-05-23 DIAGNOSIS — R82998 Other abnormal findings in urine: Secondary | ICD-10-CM | POA: Diagnosis not present

## 2023-05-23 DIAGNOSIS — C679 Malignant neoplasm of bladder, unspecified: Secondary | ICD-10-CM

## 2023-05-23 MED ORDER — BCG LIVE 50 MG IS SUSR: 3.2400 mL | Freq: Once | INTRAVESICAL | Status: DC

## 2023-05-23 MED ORDER — CEFUROXIME AXETIL 250 MG PO TABS: 250.0000 mg | ORAL_TABLET | Freq: Two times a day (BID) | ORAL | 0 refills | Status: AC

## 2023-05-23 NOTE — Progress Notes (Signed)
Patient presented to the clinic today for a scheduled BCG instillation.  Urine grossly infected on UA, culture pending.  Patient reports she is asymptomatic.  Will treat for acute UTI today.  Prescription for cefuroxime 250mg  BID x5 days sent to pharmacy.  Patient to return to clinic next week for next scheduled BCG treatment.  We will add an additional treatment to her scheduled series to make up for today's missed instillation.  Carman Ching, PA-C 05/23/23 4:10 PM

## 2023-05-24 LAB — MICROSCOPIC EXAMINATION
RBC, Urine: >30 /[HPF] — AB (ref 0–2)
WBC, UA: >30 /[HPF] — AB (ref 0–5)

## 2023-05-24 LAB — URINALYSIS, COMPLETE
Bilirubin, UA: NEGATIVE
Glucose, UA: NEGATIVE
Nitrite, UA: POSITIVE — AB
Specific Gravity, UA: 1.025 (ref 1.005–1.030)
Urobilinogen, Ur: 0.2 mg/dL (ref 0.2–1.0)
pH, UA: 5.5 (ref 5.0–7.5)

## 2023-05-25 ENCOUNTER — Inpatient Hospital Stay (HOSPITAL_BASED_OUTPATIENT_CLINIC_OR_DEPARTMENT_OTHER): Payer: Medicare Other | Admitting: Oncology

## 2023-05-25 ENCOUNTER — Encounter: Payer: Self-pay | Admitting: Oncology

## 2023-05-25 ENCOUNTER — Inpatient Hospital Stay: Payer: Medicare Other

## 2023-05-25 VITALS — BP 159/77 | HR 94 | Temp 96.5°F | Resp 98 | Wt 117.9 lb

## 2023-05-25 VITALS — BP 166/74 | HR 89 | Temp 96.0°F | Resp 18

## 2023-05-25 DIAGNOSIS — D649 Anemia, unspecified: Secondary | ICD-10-CM

## 2023-05-25 DIAGNOSIS — E876 Hypokalemia: Secondary | ICD-10-CM

## 2023-05-25 DIAGNOSIS — Z86711 Personal history of pulmonary embolism: Secondary | ICD-10-CM | POA: Diagnosis not present

## 2023-05-25 DIAGNOSIS — C679 Malignant neoplasm of bladder, unspecified: Secondary | ICD-10-CM

## 2023-05-25 DIAGNOSIS — Z5112 Encounter for antineoplastic immunotherapy: Secondary | ICD-10-CM

## 2023-05-25 DIAGNOSIS — C3482 Malignant neoplasm of overlapping sites of left bronchus and lung: Secondary | ICD-10-CM | POA: Diagnosis not present

## 2023-05-25 LAB — CBC WITH DIFFERENTIAL (CANCER CENTER ONLY)
Abs Immature Granulocytes: 0.03 10*3/uL (ref 0.00–0.07)
Basophils Absolute: 0 10*3/uL (ref 0.0–0.1)
Basophils Relative: 0 %
Eosinophils Absolute: 0.3 10*3/uL (ref 0.0–0.5)
Eosinophils Relative: 4 %
HCT: 32.7 % — ABNORMAL LOW (ref 36.0–46.0)
Hemoglobin: 10.6 g/dL — ABNORMAL LOW (ref 12.0–15.0)
Immature Granulocytes: 0 %
Lymphocytes Relative: 18 %
Lymphs Abs: 1.4 10*3/uL (ref 0.7–4.0)
MCH: 28.6 pg (ref 26.0–34.0)
MCHC: 32.4 g/dL (ref 30.0–36.0)
MCV: 88.4 fL (ref 80.0–100.0)
Monocytes Absolute: 0.5 10*3/uL (ref 0.1–1.0)
Monocytes Relative: 6 %
Neutro Abs: 5.4 10*3/uL (ref 1.7–7.7)
Neutrophils Relative %: 72 %
Platelet Count: 248 10*3/uL (ref 150–400)
RBC: 3.7 MIL/uL — ABNORMAL LOW (ref 3.87–5.11)
RDW: 18 % — ABNORMAL HIGH (ref 11.5–15.5)
WBC Count: 7.5 10*3/uL (ref 4.0–10.5)
nRBC: 0 % (ref 0.0–0.2)

## 2023-05-25 LAB — CMP (CANCER CENTER ONLY)
ALT: 14 U/L (ref 0–44)
AST: 19 U/L (ref 15–41)
Albumin: 3.7 g/dL (ref 3.5–5.0)
Alkaline Phosphatase: 59 U/L (ref 38–126)
Anion gap: 10 (ref 5–15)
BUN: 22 mg/dL (ref 8–23)
CO2: 24 mmol/L (ref 22–32)
Calcium: 9.4 mg/dL (ref 8.9–10.3)
Chloride: 103 mmol/L (ref 98–111)
Creatinine: 0.74 mg/dL (ref 0.44–1.00)
GFR, Estimated: >60 mL/min (ref >60)
Glucose, Bld: 117 mg/dL — ABNORMAL HIGH (ref 70–99)
Potassium: 3.3 mmol/L — ABNORMAL LOW (ref 3.5–5.1)
Sodium: 137 mmol/L (ref 135–145)
Total Bilirubin: 0.3 mg/dL (ref 0.0–1.2)
Total Protein: 7.1 g/dL (ref 6.5–8.1)

## 2023-05-25 MED ORDER — SODIUM CHLORIDE 0.9% FLUSH
10.0000 mL | INTRAVENOUS | Status: DC | PRN
  Administered 2023-05-25: 10 mL via INTRAVENOUS
  Filled 2023-05-25: qty 10

## 2023-05-25 MED ORDER — PEMBROLIZUMAB CHEMO INJECTION 100 MG/4ML
200.0000 mg | Freq: Once | INTRAVENOUS | Status: AC
  Administered 2023-05-25: 200 mg via INTRAVENOUS
  Filled 2023-05-25: qty 8

## 2023-05-25 MED ORDER — SODIUM CHLORIDE 0.9 % IV SOLN
Freq: Once | INTRAVENOUS | Status: AC
  Filled 2023-05-25: qty 250

## 2023-05-25 MED ORDER — HEPARIN SOD (PORK) LOCK FLUSH 100 UNIT/ML IV SOLN
500.0000 [IU] | Freq: Once | INTRAVENOUS | Status: AC | PRN
  Administered 2023-05-25: 500 [IU]
  Filled 2023-05-25: qty 5

## 2023-05-25 NOTE — Progress Notes (Signed)
Pt here for follow up. She is currently on an antibiotic for UTI. Pt also reports that she woke up with a sore throat.

## 2023-05-25 NOTE — Patient Instructions (Signed)
[facility] - A DEPT OF [facility]  Discharge Instructions: Thank you for choosing [facility] to provide your oncology and hematology care.  If you have a lab appointment with the Cancer Center, please go directly to the Cancer Center and check in at the registration area.  Wear comfortable clothing and clothing appropriate for easy access to any Portacath or PICC line.   We strive to give you quality time with your provider. You may need to reschedule your appointment if you arrive late (15 or more minutes).  Arriving late affects you and other patients whose appointments are after yours.  Also, if you miss three or more appointments without notifying the office, you may be dismissed from the clinic at the provider's discretion.      For prescription refill requests, have your pharmacy contact our office and allow 72 hours for refills to be completed.    Today you received the following chemotherapy and/or immunotherapy agents [name]      To help prevent nausea and vomiting after your treatment, we encourage you to take your nausea medication as directed.  BELOW ARE SYMPTOMS THAT SHOULD BE REPORTED IMMEDIATELY: *FEVER GREATER THAN 100.4 F (38 C) OR HIGHER *CHILLS OR SWEATING *NAUSEA AND VOMITING THAT IS NOT CONTROLLED WITH YOUR NAUSEA MEDICATION *UNUSUAL SHORTNESS OF BREATH *UNUSUAL BRUISING OR BLEEDING *URINARY PROBLEMS (pain or burning when urinating, or frequent urination) *BOWEL PROBLEMS (unusual diarrhea, constipation, pain near the anus) TENDERNESS IN MOUTH AND THROAT WITH OR WITHOUT PRESENCE OF ULCERS (sore throat, sores in mouth, or a toothache) UNUSUAL RASH, SWELLING OR PAIN  UNUSUAL VAGINAL DISCHARGE OR ITCHING   Items with * indicate a potential emergency and should be followed up as soon as possible or go to the Emergency Department if any problems should occur.  Please show the CHEMOTHERAPY ALERT CARD or IMMUNOTHERAPY  ALERT CARD at check-in to the Emergency Department and triage nurse.  Should you have questions after your visit or need to cancel or reschedule your appointment, please contact [facility] - A DEPT OF [facility]  [phone] and follow the prompts.  Office hours are 8:00 a.m. to 4:30 p.m. Monday - Friday. Please note that voicemails left after 4:00 p.m. may not be returned until the following business day.  We are closed weekends and major holidays. You have access to a nurse at all times for urgent questions. Please call the main number to the clinic [phone] and follow the prompts.  For any non-urgent questions, you may also contact your provider using MyChart. We now offer e-Visits for anyone 93 and older to request care online for non-urgent symptoms. For details visit [email or web address].   Also download the MyChart app! Go to the app store, search "MyChart", open the app, select [place], and log in with your MyChart username and password.

## 2023-05-25 NOTE — Assessment & Plan Note (Signed)
She has been off  Elqiuis 5mg  BID due to GI bleeding

## 2023-05-25 NOTE — Progress Notes (Signed)
Hematology/Oncology Progress note [phone] [phone]       REASON OF VISIT Stage IV Lung squamous cell carcinoma  ASSESSMENT & PLAN:   Cancer Staging  Malignant neoplasm of lung (HCC) Staging form: Lung, AJCC 8th Edition - Clinical: Stage IVA (cT4, cN3, cM1a) - Signed by Rickard Patience, MD on 06/28/2022  Urothelial carcinoma of bladder [facility]) Staging form: Urinary Bladder, AJCC 8th Edition - Clinical stage from 12/21/2022: Stage I (cT1, cN0, cM0) - Signed by Rickard Patience, MD on 12/29/2022   Malignant neoplasm of lung (HCC) Non-small cell lung cancer, favor squamous cell carcinoma, stage IV- TPS 90%, TMB 10, KRAS G12V MRI brain with and without contrast -  Left occipital focus of prior hemorrhage with mild linear contrast enhancement. Possible small metastasis or microhemmorhagic event.  TPS 90%, CT scan showed partial response. .Labs are reviewed and discussed with patient.  Proceed with Keytruda Repeat CT in Jan 2025 showed stable disease    Encounter for antineoplastic immunotherapy Immunotherapy treatment plan as listed above.  History of pulmonary embolism She has been off  Elqiuis 5mg  BID due to GI bleeding  Hypokalemia Continue KCL daily   Normocytic anemia Chronic anemia.  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  03/29/2023 Non-muscle invasive bladder cancer. S/p TURBT follow-up with urology, there is plan for intravesical BCG treatments- delayed due to UTI, on antibiotics   No orders of the defined types were placed in this encounter.  Follow up in 3 weeks.   All questions were answered. The patient knows to call the clinic with any problems, questions or concerns.  Rickard Patience, MD, PhD [facility] Hematology Oncology 05/25/2023       HISTORY OF PRESENTING ILLNESS:  Regina Perkins 83 y.o. 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 I have reviewed her chart and materials related to her cancer extensively and collaborated history with the patient. Summary of oncologic history is as follows: Oncology History  Malignant neoplasm of lung (HCC)  05/24/2022 Imaging   CT chest angiogram showed 1. No evidence for pulmonary embolism. 2. Left upper lobe/prevascular mass worrisome for neoplasm. 3. Mediastinal and hilar lymphadenopathy. 4. Multiple pulmonary nodules measuring up to 11 mm worrisome for metastatic disease. 5. Small left pleural effusion. 6. Patchy ground-glass and airspace opacities in the left upper lobe worrisome for infection. 7. Moderate-sized pericardial effusion. 8. Cholelithiasis. 9. Nonobstructing left renal calculus   05/25/2022 Imaging   CT abdomen pelvis wo contrast 1. No acute findings or explanation for the patient's symptoms. 2. No evidence of primary malignancy or metastatic disease within the abdomen or pelvis on noncontrast imaging.  3. Cholelithiasis without evidence of cholecystitis or biliary dilatation. 4. Nonobstructing left renal calculus. 5. Moderate stool throughout the colon suggesting constipation. 6.  Aortic Atherosclerosis    05/26/2022 Initial Diagnosis   Malignant neoplasm of lung - TPS 90%, TMB 10, KRAS G12V  -05/22/2022 in the emergency room, she was found to have hypoxia with oxygen levels of 86% on room air, improved to 94 with 2 L of oxygen.  D-dimer was elevated at 1.29, troponin negative.  Lower extremity Doppler was negative for DVT.  VQ scan high probability PE (Large segmental perfusion defect of the left upper lobe without corresponding radiographic abnormality. Additional bilateral wedge-shaped subsegmental branch perfusion defects .  Patient was admitted and  started on heparin Echocardiogram showed no RV strain.  Moderate pericardial effusion. Over her hospitalization, she  continues to have shortness of breath with minimal exertion. 05/24/2022, CT chest angiogram showed left lung mass with bilateral lung nodules.   06/16/2022 Imaging   PET scan  1. 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 No evidence of metastatic disease in the abdomen or pelvis. 2. Moderate pericardial effusion. 3. Tiny left pleural effusion. 4. Left renal stone. 5. Aortic atherosclerosis (ICD10-I70.0). Coronary artery calcification. 6. Enlarged pulmonic trunk, indicative of pulmonary arterial hypertension    06/21/2022 Procedure   US guided liver right supraclvicular node biopsy showed Metastatic poorly differentiated carcinoma, favor squamous cell carcinoma, probably pulmonary origin.    06/28/2022 Cancer Staging   Staging form: Lung, AJCC 8th Edition - Clinical: Stage IVA (cT4, cN3, cM1a) - Signed by Rickard Patience, MD on 06/28/2022   07/14/2022 -  Chemotherapy   Patient is on Treatment Plan : LUNG NSCLC Pembrolizumab (200) q21d     09/29/2022 Imaging   CT chest abdomen pelvis w contrast showed Bilateral pulmonary nodules, some of which are waxing/waning, but favoring multifocal lung carcinoma/metastases.   Improving thoracic nodal metastases, as above.  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 Consider cystoscopy as clinically warranted.  Aortic Atherosclerosis (ICD10-I70.0) and Emphysema (ICD10-J43.9)   01/06/2023 Imaging   CT chest abdomen pelvis w contrast showed 1. Interval decrease in size of multiple spiculated and subsolid pulmonary nodules, consistent with treatment response. 2. No significant change in matted post treatment prevascular and left superior mediastinal soft tissue. 3. Diminished size of an endoluminal  bladder mass seen on prior examination, soft tissue in this vicinity now measuring no greater than 2.1 x 1.4 cm, previously at least 4.7 x 2.1 cm. Presumably this has been at least partially resected. 4. No evidence of lymphadenopathy or metastatic disease in the abdomen or pelvis. 5. Cholelithiasis. 6. Nonobstructive left nephrolithiasis. 7. Coronary artery disease.   01/10/2023 Procedure   Medi port placed by IR   Urothelial carcinoma of bladder (HCC)  10/06/2022 Initial Diagnosis   Urothelial carcinoma of bladder S/p TURBT  1. 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 Bladder, biopsy, Base of tumor HIGH GRADE PAPILLARY UROTHELIAL CARCINOMA SUBMUCOSA AND MUSCULARIS PROPRIA IS PRESENT AND NOT INVOLVED BY CARCINOMA  Postprocedure, patient developed gross hematuria and was admitted to the hospital.  She received PRBC transfusion.  Hemoglobin improved and she was discharged.    12/21/2022 Cancer Staging   Staging form: Urinary Bladder, AJCC 8th Edition - Clinical stage from 12/21/2022: Stage I (cT1, cN0, cM0) - Signed by Rickard Patience, MD on 12/29/2022 WHO/ISUP grade (low/high): High Grade Histologic grading system: 2 grade system     INTERVAL HISTORY [place] is a 83 y.o. female who has above history reviewed by me today presents for follow up visit for Lung squamous cell carcinoma She tolerates [place] well. Denies skin rash, diarrhea.  chronic SOB with exertion, improved she has been using her inhalers.   -cystoscopy, non invasive high grade papillary urothelial carcinomas s/p TURBT, there is plan for intravesical BCGs Currently on antibiotics for UTI, BCG treatment is delayed  MEDICAL HISTORY:  Past Medical History:  Diagnosis Date   Atherosclerosis of native arteries of  extremity with intermittent claudication (HCC)    Bladder mass    Chronic kidney disease, stage 3a (HCC)    COPD (chronic obstructive pulmonary disease) (HCC)    Diabetes mellitus without complication (HCC)    Gastrointestinal hemorrhage    GERD (gastroesophageal reflux disease)    Glaucoma    History of duodenal ulcer    HTN (hypertension)    Hypertension    Hypokalemia    Hypomagnesemia    Leukocytosis    Lung mass    Malignant neoplasm of lung (HCC) 06/28/2022   a.) stage IVA (cT4, cN3, cM1a) favor squamous cell carcinoma --> TPS 90%, TMB 10, KRAS G12V   Malnutrition (HCC)    Normocytic anemia    Pericardial effusion 05/2022   Pulmonary embolism (HCC)    Sepsis (HCC)    Tobacco abuse    Type II diabetes mellitus with renal manifestations (HCC)    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   a.) stage I (cT1, cN0, cM0)    SURGICAL HISTORY: Past Surgical History:  Procedure Laterality Date   ABDOMINAL HYSTERECTOMY     BACK SURGERY     1988 and 1998   CYSTOSCOPY W/ RETROGRADES Bilateral 12/21/2022   Procedure: CYSTOSCOPY WITH RETROGRADE PYELOGRAM;  Surgeon: [name], MD;  Location: [facility] ORS;  Service: Urology;  Laterality: Bilateral;   CYSTOSCOPY WITH BIOPSY N/A 03/29/2023   Procedure: CYSTOSCOPY WITH BLADDER BIOPSY;  Surgeon: [name], MD;  Location: [facility];  Service: Urology;  Laterality: N/A;   CYSTOSCOPY WITH STENT PLACEMENT Right 03/29/2023   Procedure: CYSTOSCOPY WITH STENT PLACEMENT;  Surgeon: [name], MD;  Location: [facility];  Service: Urology;  Laterality: Right;   ESOPHAGOGASTRODUODENOSCOPY (EGD) WITH PROPOFOL N/A 07/29/2022   Procedure: ESOPHAGOGASTRODUODENOSCOPY (EGD) WITH PROPOFOL;  Surgeon: Wyline Mood, MD;  Location: [facility] ENDOSCOPY;  Service: Gastroenterology;  Laterality: N/A;   ESOPHAGOGASTRODUODENOSCOPY (EGD) WITH PROPOFOL N/A 01/21/2023   Procedure: ESOPHAGOGASTRODUODENOSCOPY (EGD) WITH PROPOFOL;  Surgeon: Wyline Mood, MD;   Location: [facility] ENDOSCOPY;  Service: Gastroenterology;  Laterality: N/A;   HOLMIUM LASER APPLICATION Right 03/29/2023   Procedure: HOLMIUM LASER ABLATION;  Surgeon: [name], MD;  Location: [facility];  Service: Urology;  Laterality: Right;   IR IMAGING GUIDED PORT INSERTION  01/10/2023   TRANSURETHRAL RESECTION OF BLADDER TUMOR N/A 12/21/2022   Procedure: TRANSURETHRAL RESECTION OF BLADDER TUMOR (TURBT);  Surgeon: [name], MD;  Location: [facility];  Service: Urology;  Laterality: N/A;   TRANSURETHRAL RESECTION OF BLADDER TUMOR N/A 03/29/2023   Procedure: TRANSURETHRAL RESECTION OF BLADDER TUMOR (TURBT);  Surgeon: [name], MD;  Location: [facility];  Service: Urology;  Laterality: N/A;   URETEROSCOPY Right 03/29/2023   Procedure: DIAGNOSTIC URETEROSCOPY;  Surgeon: [name], MD;  Location: [facility] OR[facility];  Service: Urology;  Laterality: Right;    SOCIAL HISTORY: Social History   Socioeconomic History   Marital status: Widowed    Spouse name: Not on file   Number of children: Not on file   Years of education: Not on file   Highest education level: Not on file  Occupational History   Not on file  Tobacco Use   Smoking status: Every Day    Current packs/day: 0.25    Types: Cigarettes   Smokeless tobacco: Never  Vaping Use   Vaping status: Never Used  Substance and Sexual Activity   Alcohol use: No  Drug use: No   Sexual activity: Not on file  Other Topics Concern   Not on file  Social History Narrative   Not on file   Social Drivers of Health   Financial Resource Strain: Low Risk  (06/28/2022)   Overall Financial Resource Strain (CARDIA)    Difficulty of Paying Living Expenses: Not very hard  Food Insecurity: No Food Insecurity (12/22/2022)   Hunger Vital Sign    Worried About Running Out of Food in the Last Year: Never true    Ran Out of Food in the Last Year: Never true  Transportation Needs: No Transportation Needs (12/22/2022)   PRAPARE - Therapist, art (Medical): No    Lack of Transportation (Non-Medical): No  Physical Activity: Not on file  Stress: No Stress Concern Present (06/28/2022)   [employer] of Occupational Health - Occupational Stress Questionnaire    Feeling of Stress : Not at all  Social Connections: Not on file  Intimate Partner Violence: Not At Risk (12/22/2022)   Humiliation, Afraid, Rape, and Kick questionnaire    Fear of Current or Ex-Partner: No    Emotionally Abused: No    Physically Abused: No    Sexually Abused: No    FAMILY HISTORY: Family History  Problem Relation Age of Onset   Asthma Mother    Heart disease Mother    Hypertension Mother    Diabetes Mother    Stroke Mother    Diabetes Sister    Diabetes Brother     ALLERGIES:  is allergic to ivp dye [iodinated contrast media] and shellfish allergy.  MEDICATIONS:  Current Outpatient Medications  Medication Sig Dispense Refill   albuterol (VENTOLIN HFA) 108 (90 Base) MCG/ACT inhaler Inhale 2 puffs into the lungs every 4 (four) hours as needed for wheezing or shortness of breath.     amLODipine (NORVASC) 10 MG tablet Take 10 mg by mouth every morning.     cefUROXime (CEFTIN) 250 MG tablet Take 1 tablet (250 mg total) by mouth 2 (two) times daily with a meal for 5 days. 10 tablet 0   cyanocobalamin (VITAMIN B12) 1000 MCG tablet Take 1 tablet (1,000 mcg total) by mouth daily. 90 tablet 0   lidocaine-prilocaine (EMLA) cream Apply 1 Application topically as needed. Apply to port and cover with saran wrap 1-2 hours prior to port access 30 g 1   lisinopril (ZESTRIL) 40 MG tablet Take 40 mg by mouth every morning.     metFORMIN (GLUCOPHAGE) 1000 MG tablet Take 1,000 mg by mouth daily with breakfast.     potassium chloride (KLOR-CON) 10 MEQ tablet Take 1 tablet (10 mEq total) by mouth daily. Be sure to take dose on day of surgery. Follow up with PCP or oncology for repeat labs. 30 tablet 1   No current facility-administered  medications for this visit.   Facility-Administered Medications Ordered in Other Visits  Medication Dose Route Frequency Provider Last Rate Last Admin   sodium chloride flush (NS) 0.9 % injection 10 mL  10 mL Intravenous PRN Rickard Patience, MD   10 mL at 05/25/23 0843    Review of Systems  Constitutional:  Negative for appetite change, chills, fatigue and fever.  HENT:   Negative for hearing loss and voice change.   Eyes:  Negative for eye problems.  Respiratory:  Positive for shortness of breath. Negative for chest tightness and cough.   Cardiovascular:  Negative for chest pain.  Gastrointestinal:  Negative for abdominal  distention, abdominal pain and blood in stool.  Endocrine: Negative for hot flashes.  Genitourinary:  Negative for difficulty urinating and frequency.   Musculoskeletal:  Negative for arthralgias.  Skin:  Negative for itching and rash.  Neurological:  Negative for extremity weakness.  Hematological:  Negative for adenopathy.  Psychiatric/Behavioral:  Negative for confusion.      PHYSICAL EXAMINATION: ECOG PERFORMANCE STATUS: 2 - Symptomatic, <50% confined to bed  Vitals:   05/25/23 0900 05/25/23 0907  BP: (!) 169/76 (!) 159/77  Pulse:  94  Resp:    Temp:    SpO2:      Filed Weights   05/25/23 0858  Weight: 117 lb 14.4 oz (53.5 kg)     Physical Exam Constitutional:      General: She is not in acute distress.    Appearance: She is not diaphoretic.  HENT:     Head: Normocephalic.  Eyes:     General: No scleral icterus.    Pupils: Pupils are equal, round, and reactive to light.  Cardiovascular:     Heart sounds: Murmur heard.  Pulmonary:     Effort: Pulmonary effort is normal. No respiratory distress.     Comments: Decreased breath sounds bilaterally Abdominal:     General: There is no distension.  Musculoskeletal:        General: Normal range of motion.     Cervical back: Normal range of motion.  Skin:    Findings: No erythema.  Neurological:      Mental Status: She is alert and oriented to person, place, and time. Mental status is at baseline.     Cranial Nerves: No cranial nerve deficit.     Motor: No abnormal muscle tone.  Psychiatric:        Mood and Affect: Affect normal.      LABORATORY DATA:  I have reviewed the data as listed    Latest Ref Rng & Units 05/25/2023    8:44 AM 05/04/2023    8:36 AM 04/13/2023    8:54 AM  CBC  WBC 4.0 - 10.5 K/uL 7.5  6.4  13.4   Hemoglobin 12.0 - 15.0 g/dL 40.9  81.1  91.4   Hematocrit 36.0 - 46.0 % 32.7  31.2  30.8   Platelets 150 - 400 K/uL 248  349  357       Latest Ref Rng & Units 05/25/2023    8:44 AM 05/04/2023    8:36 AM 04/29/2023    1:37 PM  CMP  Glucose 70 - 99 mg/dL 782  956  213   BUN 8 - 23 mg/dL 22  9  16    Creatinine 0.44 - 1.00 mg/dL 0.86  5.78  4.69   Sodium 135 - 145 mmol/L 137  138  136   Potassium 3.5 - 5.1 mmol/L 3.3  3.1  3.2   Chloride 98 - 111 mmol/L 103  101  100   CO2 22 - 32 mmol/L 24  27  26    Calcium 8.9 - 10.3 mg/dL 9.4  8.9  8.8   Total Protein 6.5 - 8.1 g/dL 7.1  6.8    Total Bilirubin 0.0 - 1.2 mg/dL 0.3  0.4    Alkaline Phos 38 - 126 U/L 59  64    AST 15 - 41 U/L 19  19    ALT 0 - 44 U/L 14  16       RADIOGRAPHIC STUDIES: I have personally reviewed the radiological images as listed and  agreed with the findings in the report. CT CHEST ABDOMEN PELVIS W CONTRAST Result Date: 05/11/2023 CLINICAL DATA:  Metastatic squamous cell lung cancer restaging, additional history of urothelial malignancy * Tracking Code: BO * EXAM: CT CHEST, ABDOMEN, AND PELVIS WITH CONTRAST TECHNIQUE: Multidetector CT imaging of the chest, abdomen and pelvis was performed following the standard protocol during bolus administration of intravenous contrast. RADIATION DOSE REDUCTION: This exam was performed according to the departmental dose-optimization program which includes automated exposure control, adjustment of the mA and/or kV according to patient size and/or use of iterative  reconstruction technique. CONTRAST:  75mL OMNIPAQUE IOHEXOL 300 MG/ML SOLN additional oral enteric contrast COMPARISON:  01/06/2023 FINDINGS: CT CHEST FINDINGS Cardiovascular: Severe aortic atherosclerosis. Normal heart size. Left coronary artery calcifications no pericardial effusion. Mediastinum/Nodes: Unchanged matted, treated prevascular and left superior mediastinal soft tissue (series 2, image 16, 20). Thyroid gland, trachea, and esophagus demonstrate no significant findings. Lungs/Pleura: Mild centrilobular emphysema. Diffuse bilateral bronchial wall thickening. Multiple spiculated ground-glass bilateral pulmonary nodules not significantly changed, index nodule of the peripheral left upper lobe measuring 0.8 x 0.7 cm (series 4, image 38), index partially cystic and solid nodule of the superior segment left lower lobe measuring 1.7 x 1.2 cm (series 4, image 47) no pleural effusion or pneumothorax. Musculoskeletal: No chest wall abnormality. No acute osseous findings. CT ABDOMEN PELVIS FINDINGS Hepatobiliary: No solid liver abnormality is seen. Multiple small fluid attenuation cysts, for which no further follow-up or characterization is required. Gallstone (series 2, image 63). No gallbladder wall thickening, or biliary dilatation. Pancreas: Unremarkable. No pancreatic ductal dilatation or surrounding inflammatory changes. Spleen: Normal in size without significant abnormality. Adrenals/Urinary Tract: Adrenal glands are unremarkable. Right-sided double-J ureteral stent catheter with formed pigtails in the right renal pelvis and urinary bladder. No hydronephrosis. Small nonobstructive calculus of the midportion of the left kidney (series 2, image 66). No right-sided calculi or hydronephrosis. No residual mass of the posterior right aspect of the urinary bladder (series 2, image 102). Mild residual mucosal wall thickening in this vicinity. Stomach/Bowel: Stomach is within normal limits. Appendix not clearly  visualized. No evidence of bowel wall thickening, distention, or inflammatory changes. Vascular/Lymphatic: Severe aortic atherosclerosis. No enlarged abdominal or pelvic lymph nodes. Reproductive: No mass or other abnormality. Other: No abdominal wall hernia or abnormality. No ascites. Musculoskeletal: No acute osseous findings. IMPRESSION: 1. Unchanged matted, treated prevascular and left superior mediastinal soft tissue. 2. Multiple bilateral spiculated and ground-glass pulmonary nodules not significantly changed. 3. No residual mass of the posterior right aspect of the urinary bladder. Mild residual thickening in this vicinity. Interval placement of right-sided double-J ureteral stent catheter. 4. No evidence of lymphadenopathy or metastatic disease in the abdomen or pelvis. 5. Emphysema and diffuse bilateral bronchial wall thickening. 6. Coronary artery disease. Aortic Atherosclerosis (ICD10-I70.0) and Emphysema (ICD10-J43.9). Electronically Signed   By: Jearld Lesch M.D.   On: 05/11/2023 22:37

## 2023-05-25 NOTE — Assessment & Plan Note (Signed)
Immunotherapy treatment plan as listed above.

## 2023-05-25 NOTE — Assessment & Plan Note (Signed)
12/21/2022 Non-muscle invasive bladder cancer. S/p TURBT, T1 lesion, high grade.  03/29/2023 Non-muscle invasive bladder cancer. S/p TURBT follow-up with urology, there is plan for intravesical BCG treatments- delayed due to UTI, on antibiotics

## 2023-05-25 NOTE — Assessment & Plan Note (Signed)
Chronic anemia.  Hemoglobin is stable

## 2023-05-25 NOTE — Assessment & Plan Note (Signed)
Continue KCL daily

## 2023-05-25 NOTE — Assessment & Plan Note (Addendum)
Non-small cell lung cancer, favor squamous cell carcinoma, stage IV- TPS 90%, TMB 10, KRAS G12V MRI brain with and without contrast -  Left occipital focus of prior hemorrhage with mild linear contrast enhancement. Possible small metastasis or microhemmorhagic event.  TPS 90%, CT scan showed partial response. .Labs are reviewed and discussed with patient.  Proceed with Keytruda Repeat CT in Jan 2025 showed stable disease

## 2023-05-26 LAB — CULTURE, URINE COMPREHENSIVE

## 2023-05-27 ENCOUNTER — Other Ambulatory Visit: Payer: Self-pay

## 2023-05-27 DIAGNOSIS — C679 Malignant neoplasm of bladder, unspecified: Secondary | ICD-10-CM

## 2023-05-27 MED ORDER — NITROFURANTOIN MONOHYD MACRO 100 MG PO CAPS: 100.0000 mg | ORAL_CAPSULE | Freq: Two times a day (BID) | ORAL | 0 refills | Status: AC

## 2023-05-30 ENCOUNTER — Encounter: Payer: Self-pay | Admitting: Physician Assistant

## 2023-05-30 ENCOUNTER — Ambulatory Visit: Payer: Medicare Other | Admitting: Physician Assistant

## 2023-05-30 VITALS — BP 159/79 | HR 108 | Ht 63.0 in | Wt 117.0 lb

## 2023-05-30 DIAGNOSIS — R82998 Other abnormal findings in urine: Secondary | ICD-10-CM

## 2023-05-30 DIAGNOSIS — C679 Malignant neoplasm of bladder, unspecified: Secondary | ICD-10-CM

## 2023-05-30 LAB — URINALYSIS, COMPLETE
Bilirubin, UA: NEGATIVE
Glucose, UA: NEGATIVE
Ketones, UA: NEGATIVE
Nitrite, UA: NEGATIVE
Specific Gravity, UA: 1.02 (ref 1.005–1.030)
Urobilinogen, Ur: 0.2 mg/dL (ref 0.2–1.0)
pH, UA: 6 (ref 5.0–7.5)

## 2023-05-30 LAB — MICROSCOPIC EXAMINATION
RBC, Urine: >30 /[HPF] — AB (ref 0–2)
WBC, UA: >30 /[HPF] — AB (ref 0–5)

## 2023-05-30 NOTE — Progress Notes (Signed)
Patient presented to the clinic today for a scheduled BCG instillation.  Urine grossly infected on UA despite starting culture appropriate Macrobid 4 days ago. Patient reports persistent bladder discomfort. I offered to switch her abx today but she declined. She will complete Macrobid as prescribed and I'll repeat urine culture today, treating as indicated. Patient to return to clinic next week for next scheduled BCG treatment.  We will add an additional treatment to her scheduled series to make up for today's missed instillation.  Carman Ching, PA-C 05/30/23 4:17 PM

## 2023-05-31 ENCOUNTER — Telehealth: Payer: Self-pay | Admitting: Pharmacy Technician

## 2023-05-31 NOTE — Telephone Encounter (Signed)
 Auth Submission: NO AUTH NEEDED Site of care: [facility] Payer: [employer] MEDICARE Medication & CPT/J Code(s) submitted: BCG  Route of submission (phone, fax, portal):  Phone # Fax # Auth type: Buy/Bill PB Units/visits requested:  Reference number:  Approval from: 05/31/23 to 04/25/24

## 2023-06-02 LAB — CULTURE, URINE COMPREHENSIVE

## 2023-06-06 ENCOUNTER — Ambulatory Visit (INDEPENDENT_AMBULATORY_CARE_PROVIDER_SITE_OTHER): Payer: Medicare Other | Admitting: Physician Assistant

## 2023-06-06 VITALS — BP 155/84 | HR 111

## 2023-06-06 DIAGNOSIS — C679 Malignant neoplasm of bladder, unspecified: Secondary | ICD-10-CM

## 2023-06-06 LAB — URINALYSIS, COMPLETE
Bilirubin, UA: NEGATIVE
Glucose, UA: NEGATIVE
Ketones, UA: NEGATIVE
Nitrite, UA: POSITIVE — AB
Specific Gravity, UA: 1.015 (ref 1.005–1.030)
Urobilinogen, Ur: 1 mg/dL (ref 0.2–1.0)
pH, UA: 7 (ref 5.0–7.5)

## 2023-06-06 LAB — MICROSCOPIC EXAMINATION: WBC, UA: >30 /[HPF] — AB (ref 0–5)

## 2023-06-06 MED ORDER — BCG LIVE 50 MG IS SUSR
3.2400 mL | Freq: Once | INTRAVESICAL | Status: AC
Start: 2023-06-06 — End: 2023-06-06
  Administered 2023-06-06: 81 mg via INTRAVESICAL

## 2023-06-06 MED ORDER — NITROFURANTOIN MONOHYD MACRO 100 MG PO CAPS: 100.0000 mg | ORAL_CAPSULE | Freq: Every day | ORAL | 0 refills | Status: AC

## 2023-06-06 NOTE — Progress Notes (Signed)
 BCG Bladder Instillation  BCG # 3 of 6  Due to Bladder Cancer patient is present today for a BCG treatment. Patient was cleaned and prepped in a sterile fashion with betadine. A 14FR catheter was inserted, urine return was noted 0ml.  50ml of reconstituted BCG was instilled into the bladder. The catheter was then removed. Patient tolerated well, no complications were noted  Performed by: Choice Kleinsasser, PA-C and Anastasiya Hopkins, CMA  Additional notes: UA is positive again, consistent with baseline. Her dysuria has resolved, not clinically infected. Proceeded with tx today, though will start suppressive Macrobid  100mg  daily x1 month for UTI prevention.  Follow up: 1 week

## 2023-06-10 LAB — CULTURE, URINE COMPREHENSIVE

## 2023-06-13 ENCOUNTER — Ambulatory Visit: Payer: Medicare Other | Admitting: Physician Assistant

## 2023-06-15 ENCOUNTER — Inpatient Hospital Stay (HOSPITAL_BASED_OUTPATIENT_CLINIC_OR_DEPARTMENT_OTHER): Payer: Medicare Other | Admitting: Oncology

## 2023-06-15 ENCOUNTER — Inpatient Hospital Stay: Payer: Medicare Other | Attending: Oncology

## 2023-06-15 ENCOUNTER — Inpatient Hospital Stay: Payer: Medicare Other

## 2023-06-15 ENCOUNTER — Other Ambulatory Visit: Payer: Self-pay

## 2023-06-15 ENCOUNTER — Encounter: Payer: Self-pay | Admitting: Oncology

## 2023-06-15 VITALS — BP 168/89 | HR 107 | Temp 97.9°F | Wt 117.9 lb

## 2023-06-15 DIAGNOSIS — C679 Malignant neoplasm of bladder, unspecified: Secondary | ICD-10-CM | POA: Diagnosis not present

## 2023-06-15 DIAGNOSIS — C3482 Malignant neoplasm of overlapping sites of left bronchus and lung: Secondary | ICD-10-CM

## 2023-06-15 DIAGNOSIS — Z5112 Encounter for antineoplastic immunotherapy: Secondary | ICD-10-CM | POA: Diagnosis present

## 2023-06-15 DIAGNOSIS — D649 Anemia, unspecified: Secondary | ICD-10-CM

## 2023-06-15 DIAGNOSIS — C3412 Malignant neoplasm of upper lobe, left bronchus or lung: Secondary | ICD-10-CM | POA: Insufficient documentation

## 2023-06-15 DIAGNOSIS — E876 Hypokalemia: Secondary | ICD-10-CM | POA: Diagnosis not present

## 2023-06-15 DIAGNOSIS — Z8551 Personal history of malignant neoplasm of bladder: Secondary | ICD-10-CM | POA: Insufficient documentation

## 2023-06-15 DIAGNOSIS — F17210 Nicotine dependence, cigarettes, uncomplicated: Secondary | ICD-10-CM | POA: Diagnosis not present

## 2023-06-15 DIAGNOSIS — R000 Tachycardia, unspecified: Secondary | ICD-10-CM | POA: Insufficient documentation

## 2023-06-15 DIAGNOSIS — C771 Secondary and unspecified malignant neoplasm of intrathoracic lymph nodes: Secondary | ICD-10-CM | POA: Diagnosis not present

## 2023-06-15 DIAGNOSIS — C787 Secondary malignant neoplasm of liver and intrahepatic bile duct: Secondary | ICD-10-CM | POA: Diagnosis present

## 2023-06-15 LAB — CMP (CANCER CENTER ONLY)
ALT: 12 U/L (ref 0–44)
AST: 24 U/L (ref 15–41)
Albumin: 3.7 g/dL (ref 3.5–5.0)
Alkaline Phosphatase: 60 U/L (ref 38–126)
Anion gap: 13 (ref 5–15)
BUN: 13 mg/dL (ref 8–23)
CO2: 24 mmol/L (ref 22–32)
Calcium: 9.2 mg/dL (ref 8.9–10.3)
Chloride: 96 mmol/L — ABNORMAL LOW (ref 98–111)
Creatinine: 1.07 mg/dL — ABNORMAL HIGH (ref 0.44–1.00)
GFR, Estimated: 52 mL/min — ABNORMAL LOW (ref >60)
Glucose, Bld: 261 mg/dL — ABNORMAL HIGH (ref 70–99)
Potassium: 3.5 mmol/L (ref 3.5–5.1)
Sodium: 133 mmol/L — ABNORMAL LOW (ref 135–145)
Total Bilirubin: 0.4 mg/dL (ref 0.0–1.2)
Total Protein: 6.9 g/dL (ref 6.5–8.1)

## 2023-06-15 LAB — CBC WITH DIFFERENTIAL (CANCER CENTER ONLY)
Abs Immature Granulocytes: 0.02 10*3/uL (ref 0.00–0.07)
Basophils Absolute: 0 10*3/uL (ref 0.0–0.1)
Basophils Relative: 0 %
Eosinophils Absolute: 0.2 10*3/uL (ref 0.0–0.5)
Eosinophils Relative: 2 %
HCT: 34.1 % — ABNORMAL LOW (ref 36.0–46.0)
Hemoglobin: 10.8 g/dL — ABNORMAL LOW (ref 12.0–15.0)
Immature Granulocytes: 0 %
Lymphocytes Relative: 17 %
Lymphs Abs: 1.5 10*3/uL (ref 0.7–4.0)
MCH: 28.1 pg (ref 26.0–34.0)
MCHC: 31.7 g/dL (ref 30.0–36.0)
MCV: 88.8 fL (ref 80.0–100.0)
Monocytes Absolute: 0.6 10*3/uL (ref 0.1–1.0)
Monocytes Relative: 7 %
Neutro Abs: 6.5 10*3/uL (ref 1.7–7.7)
Neutrophils Relative %: 74 %
Platelet Count: 384 10*3/uL (ref 150–400)
RBC: 3.84 MIL/uL — ABNORMAL LOW (ref 3.87–5.11)
RDW: 17.1 % — ABNORMAL HIGH (ref 11.5–15.5)
WBC Count: 8.8 10*3/uL (ref 4.0–10.5)
nRBC: 0 % (ref 0.0–0.2)

## 2023-06-15 MED ORDER — HEPARIN SOD (PORK) LOCK FLUSH 100 UNIT/ML IV SOLN
500.0000 [IU] | Freq: Once | INTRAVENOUS | Status: DC | PRN
  Filled 2023-06-15: qty 5

## 2023-06-15 MED ORDER — SODIUM CHLORIDE 0.9 % IV SOLN
Freq: Once | INTRAVENOUS | Status: AC
  Filled 2023-06-15: qty 250

## 2023-06-15 MED ORDER — TRIMETHOPRIM 100 MG PO TABS: 100.0000 mg | ORAL_TABLET | Freq: Every day | ORAL | 0 refills | Status: AC

## 2023-06-15 MED ORDER — SODIUM CHLORIDE 0.9 % IV SOLN
200.0000 mg | Freq: Once | INTRAVENOUS | Status: AC
  Administered 2023-06-15: 200 mg via INTRAVENOUS
  Filled 2023-06-15: qty 200

## 2023-06-15 NOTE — Progress Notes (Signed)
 Pt here for follow up. Pt reports she was put on Macrobin for 1 month due to UTI.

## 2023-06-15 NOTE — Assessment & Plan Note (Addendum)
 Non-small cell lung cancer, favor squamous cell carcinoma, stage IV- TPS 90%, TMB 10, KRAS G12V MRI brain with and without contrast -  Left occipital focus of prior hemorrhage with mild linear contrast enhancement. Possible small metastasis or microhemmorhagic event.  TPS 90%, CT in Jan 2025 showed stable disease Labs are reviewed and discussed with patient.  Proceed with Keytruda Obtain supplements MRI brain

## 2023-06-15 NOTE — Assessment & Plan Note (Signed)
 Chronic anemia.  Hemoglobin is stable

## 2023-06-15 NOTE — Patient Instructions (Signed)
 [facility] - A DEPT OF [facility]  Discharge Instructions: Thank you for choosing [facility] to provide your oncology and hematology care.  If you have a lab appointment with the Cancer Center, please go directly to the Cancer Center and check in at the registration area.  Wear comfortable clothing and clothing appropriate for easy access to any Portacath or PICC line.   We strive to give you quality time with your provider. You may need to reschedule your appointment if you arrive late (15 or more minutes).  Arriving late affects you and other patients whose appointments are after yours.  Also, if you miss three or more appointments without notifying the office, you may be dismissed from the clinic at the provider's discretion.      For prescription refill requests, have your pharmacy contact our office and allow 72 hours for refills to be completed.    Today you received the following chemotherapy and/or immunotherapy agents Keytruda      To help prevent nausea and vomiting after your treatment, we encourage you to take your nausea medication as directed.  BELOW ARE SYMPTOMS THAT SHOULD BE REPORTED IMMEDIATELY: *FEVER GREATER THAN 100.4 F (38 C) OR HIGHER *CHILLS OR SWEATING *NAUSEA AND VOMITING THAT IS NOT CONTROLLED WITH YOUR NAUSEA MEDICATION *UNUSUAL SHORTNESS OF BREATH *UNUSUAL BRUISING OR BLEEDING *URINARY PROBLEMS (pain or burning when urinating, or frequent urination) *BOWEL PROBLEMS (unusual diarrhea, constipation, pain near the anus) TENDERNESS IN MOUTH AND THROAT WITH OR WITHOUT PRESENCE OF ULCERS (sore throat, sores in mouth, or a toothache) UNUSUAL RASH, SWELLING OR PAIN  UNUSUAL VAGINAL DISCHARGE OR ITCHING   Items with * indicate a potential emergency and should be followed up as soon as possible or go to the Emergency Department if any problems should occur.  Please show the CHEMOTHERAPY ALERT CARD or IMMUNOTHERAPY  ALERT CARD at check-in to the Emergency Department and triage nurse.  Should you have questions after your visit or need to cancel or reschedule your appointment, please contact [facility] - A DEPT OF [facility]  [phone] and follow the prompts.  Office hours are 8:00 a.m. to 4:30 p.m. Monday - Friday. Please note that voicemails left after 4:00 p.m. may not be returned until the following business day.  We are closed weekends and major holidays. You have access to a nurse at all times for urgent questions. Please call the main number to the clinic [phone] and follow the prompts.  For any non-urgent questions, you may also contact your provider using MyChart. We now offer e-Visits for anyone 58 and older to request care online for non-urgent symptoms. For details visit [email or web address].   Also download the MyChart app! Go to the app store, search "MyChart", open the app, select [place], and log in with your MyChart username and password.

## 2023-06-15 NOTE — Assessment & Plan Note (Signed)
 Creatinine also slightly elevated.  Patient will receive IV fluid 500 open normal. Intravesical BCG may also contribute to tachycardia. Patient is currently asymptomatic.  Monitor If persistent or if she becomes symptomatic, will refer to cardiology.

## 2023-06-15 NOTE — Progress Notes (Signed)
 Hematology/Oncology Progress note [phone] [phone]       REASON OF VISIT Stage IV Lung squamous cell carcinoma  ASSESSMENT & PLAN:   Cancer Staging  Malignant neoplasm of lung (HCC) Staging form: Lung, AJCC 8th Edition - Clinical: Stage IVA (cT4, cN3, cM1a) - Signed by Rickard Patience, MD on 06/28/2022  Urothelial carcinoma of bladder [facility]) Staging form: Urinary Bladder, AJCC 8th Edition - Clinical stage from 12/21/2022: Stage I (cT1, cN0, cM0) - Signed by Rickard Patience, MD on 12/29/2022   Malignant neoplasm of lung (HCC) Non-small cell lung cancer, favor squamous cell carcinoma, stage IV- TPS 90%, TMB 10, KRAS G12V MRI brain with and without contrast -  Left occipital focus of prior hemorrhage with mild linear contrast enhancement. Possible small metastasis or microhemmorhagic event.  TPS 90%, CT in Jan 2025 showed stable disease Labs are reviewed and discussed with patient.  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 S/p TURBT, T1 lesion, high grade.  03/29/2023 Non-muscle invasive bladder cancer. S/p TURBT Patient is getting intravesical BCG treatments-she is also on suppressive antibiotics Follow-up with urology  Encounter for antineoplastic immunotherapy Immunotherapy treatment plan as listed above.  Hypokalemia Continue KCL daily   Normocytic anemia Chronic anemia.  Hemoglobin is stable   Tachycardia Creatinine also slightly elevated.  Patient will receive IV fluid 500 open normal. Intravesical BCG may also contribute to tachycardia. Patient is currently asymptomatic.  Monitor If persistent or if she becomes symptomatic, will refer to cardiology.    Orders Placed This Encounter  Procedures   MR Brain W Wo Contrast    Standing Status:   Future    Expected Date:   06/22/2023    Expiration Date:   06/14/2024    If indicated for the ordered  procedure, I authorize the administration of contrast media per Radiology protocol:   Yes    What is the patient's sedation requirement?:   No Sedation    Does the patient have a pacemaker or implanted devices?:   No    Use SRS Protocol?:   Yes    Preferred imaging location?:   [facility] (table limit - 550lbs)   CBC with Differential (Cancer Center Only)    Standing Status:   Future    Expected Date:   07/27/2023    Expiration Date:   07/26/2024   CMP (Cancer Center only)    Standing Status:   Future    Expected Date:   07/27/2023    Expiration Date:   07/26/2024   Follow up in 3 weeks.   All questions were answered. The patient knows to call the clinic with any problems, questions or concerns.  Rickard Patience, MD, PhD [facility] Hematology Oncology 06/15/2023       HISTORY OF PRESENTING ILLNESS:  Regina Perkins 83 y.o. 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 I have reviewed her chart and materials related to her cancer extensively and collaborated history with the patient. Summary of oncologic history is as follows: Oncology History  Malignant neoplasm of lung (HCC)  05/24/2022 Imaging   CT chest angiogram showed 1. No evidence for pulmonary embolism. 2. Left upper lobe/prevascular mass worrisome for neoplasm. 3. Mediastinal and hilar lymphadenopathy. 4. Multiple pulmonary nodules measuring up to 11 mm worrisome for metastatic disease. 5. Small left pleural effusion. 6. Patchy ground-glass and  airspace opacities in the left upper lobe worrisome for infection. 7. Moderate-sized pericardial effusion. 8. Cholelithiasis. 9. Nonobstructing left renal calculus   05/25/2022 Imaging   CT abdomen pelvis wo contrast 1. No acute findings or explanation for the patient's symptoms. 2. No evidence of primary malignancy or metastatic disease within the abdomen or pelvis on noncontrast imaging.   3. Cholelithiasis without evidence of cholecystitis or biliary dilatation. 4. Nonobstructing left renal calculus. 5. Moderate stool throughout the colon suggesting constipation. 6.  Aortic Atherosclerosis    05/26/2022 Initial Diagnosis   Malignant neoplasm of lung - TPS 90%, TMB 10, KRAS G12V  -05/22/2022 in the emergency room, she was found to have hypoxia with oxygen levels of 86% on room air, improved to 94 with 2 L of oxygen.  D-dimer was elevated at 1.29, troponin negative.  Lower extremity Doppler was negative for DVT.  VQ scan high probability PE (Large segmental perfusion defect of the left upper lobe without corresponding radiographic abnormality. Additional bilateral wedge-shaped subsegmental branch perfusion defects .  Patient was admitted and started on heparin Echocardiogram showed no RV strain.  Moderate pericardial effusion. Over her hospitalization, she continues to have shortness of breath with minimal exertion. 05/24/2022, CT chest angiogram showed left lung mass with bilateral lung nodules.   06/16/2022 Imaging   PET scan  1. 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 No evidence of metastatic disease in the abdomen or pelvis. 2. Moderate pericardial effusion. 3. Tiny left pleural effusion. 4. Left renal stone. 5. Aortic atherosclerosis (ICD10-I70.0). Coronary artery calcification. 6. Enlarged pulmonic trunk, indicative of pulmonary arterial hypertension    06/21/2022 Procedure   US guided liver right supraclvicular node biopsy showed Metastatic poorly differentiated carcinoma, favor squamous cell carcinoma, probably pulmonary origin.    06/28/2022 Cancer Staging   Staging form: Lung, AJCC 8th Edition -  Clinical: Stage IVA (cT4, cN3, cM1a) - Signed by Rickard Patience, MD on 06/28/2022   07/14/2022 -  Chemotherapy   Patient is on Treatment Plan : LUNG NSCLC Pembrolizumab (200) q21d     09/29/2022 Imaging   CT chest abdomen pelvis w contrast showed Bilateral pulmonary nodules, some of which are waxing/waning, but favoring multifocal lung carcinoma/metastases.   Improving thoracic nodal metastases, as above.  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 Consider cystoscopy as clinically warranted.  Aortic Atherosclerosis (ICD10-I70.0) and Emphysema (ICD10-J43.9)   01/06/2023 Imaging   CT chest abdomen pelvis w contrast showed 1. Interval decrease in size of multiple spiculated and subsolid pulmonary nodules, consistent with treatment response. 2. No significant change in matted post treatment prevascular and left superior mediastinal soft tissue. 3. Diminished size of an endoluminal bladder mass seen on prior examination, soft tissue in this vicinity now measuring no greater than 2.1 x 1.4 cm, previously at least 4.7 x 2.1 cm. Presumably this has been at least partially resected. 4. No evidence of lymphadenopathy or metastatic disease in the abdomen or pelvis. 5. Cholelithiasis. 6. Nonobstructive left nephrolithiasis. 7. Coronary artery disease.   01/10/2023 Procedure   Medi port placed by IR   05/11/2023 Imaging   CT chest abdomen pelvis w contrast showed 1. Unchanged matted, treated prevascular and left superior mediastinal soft tissue. 2. Multiple bilateral spiculated and ground-glass pulmonary nodules not significantly changed. 3. No residual mass of the posterior right aspect  of the urinary bladder. Mild residual thickening in this vicinity. Interval placement of right-sided double-J ureteral stent catheter. 4. No evidence of lymphadenopathy or metastatic disease in the abdomen or pelvis. 5. Emphysema and diffuse bilateral bronchial wall thickening. 6. Coronary artery disease.   Aortic  Atherosclerosis (ICD10-I70.0) and Emphysema (ICD10-J43.9).    Urothelial carcinoma of bladder (HCC)  10/06/2022 Initial Diagnosis   Urothelial carcinoma of bladder S/p TURBT  1. 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 Bladder, biopsy, Base of tumor HIGH GRADE PAPILLARY UROTHELIAL CARCINOMA SUBMUCOSA AND MUSCULARIS PROPRIA IS PRESENT AND NOT INVOLVED BY CARCINOMA  Postprocedure, patient developed gross hematuria and was admitted to the hospital.  She received PRBC transfusion.  Hemoglobin improved and she was discharged.    12/21/2022 Cancer Staging   Staging form: Urinary Bladder, AJCC 8th Edition - Clinical stage from 12/21/2022: Stage I (cT1, cN0, cM0) - Signed by Rickard Patience, MD on 12/29/2022 WHO/ISUP grade (low/high): High Grade Histologic grading system: 2 grade system     INTERVAL HISTORY Regina Perkins is a 83 y.o. female who has above history reviewed by me today presents for follow up visit for Lung squamous cell carcinoma She tolerates [place] well. Denies skin rash, diarrhea.  -cystoscopy, non invasive high grade papillary urothelial carcinomas s/p TURBT, s/p 3 intravesical BCGs suppressive Macrobid 100mg  daily x1 month for UTI prevention  Patient has no new complaints.  Denies chest pain, chest palpitation.  MEDICAL HISTORY:  Past Medical History:  Diagnosis Date   Atherosclerosis of native arteries of extremity with intermittent claudication (HCC)    Bladder mass    Chronic kidney disease, stage 3a (HCC)    COPD (chronic obstructive pulmonary disease) (HCC)    Diabetes mellitus without complication (HCC)    Gastrointestinal hemorrhage    GERD (gastroesophageal reflux disease)    Glaucoma    History of duodenal ulcer    HTN (hypertension)    Hypertension     Hypokalemia    Hypomagnesemia    Leukocytosis    Lung mass    Malignant neoplasm of lung (HCC) 06/28/2022   a.) stage IVA (cT4, cN3, cM1a) favor squamous cell carcinoma --> TPS 90%, TMB 10, KRAS G12V   Malnutrition (HCC)    Normocytic anemia    Pericardial effusion 05/2022   Pulmonary embolism (HCC)    Sepsis (HCC)    Tobacco abuse    Type II diabetes mellitus with renal manifestations (HCC)    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   a.) stage I (cT1, cN0, cM0)    SURGICAL HISTORY: Past Surgical History:  Procedure Laterality Date   ABDOMINAL HYSTERECTOMY     BACK SURGERY     1988 and 1998   CYSTOSCOPY W/ RETROGRADES Bilateral 12/21/2022   Procedure: CYSTOSCOPY WITH RETROGRADE PYELOGRAM;  Surgeon: [name], MD;  Location: [facility];  Service: Urology;  Laterality: Bilateral;   CYSTOSCOPY WITH BIOPSY N/A 03/29/2023   Procedure: CYSTOSCOPY WITH BLADDER BIOPSY;  Surgeon: [name], MD;  Location: [facility];  Service: Urology;  Laterality: N/A;   CYSTOSCOPY WITH STENT PLACEMENT Right 03/29/2023   Procedure: CYSTOSCOPY WITH STENT PLACEMENT;  Surgeon: [name], MD;  Location: [facility];  Service: Urology;  Laterality: Right;   ESOPHAGOGASTRODUODENOSCOPY (EGD) WITH PROPOFOL N/A 07/29/2022   Procedure: ESOPHAGOGASTRODUODENOSCOPY (EGD) WITH PROPOFOL;  Surgeon: Wyline Mood, MD;  Location: [facility];  Service: Gastroenterology;  Laterality: N/A;   ESOPHAGOGASTRODUODENOSCOPY (EGD) WITH PROPOFOL N/A 01/21/2023   Procedure: ESOPHAGOGASTRODUODENOSCOPY (EGD) WITH PROPOFOL;  Surgeon: Wyline Mood, MD;  Location: [facility];  Service: Gastroenterology;  Laterality: N/A;   HOLMIUM LASER APPLICATION Right 03/29/2023   Procedure: HOLMIUM LASER ABLATION;  Surgeon: [name], MD;  Location: [facility];  Service: Urology;  Laterality: Right;   IR IMAGING GUIDED PORT INSERTION  01/10/2023   TRANSURETHRAL RESECTION OF BLADDER TUMOR N/A 12/21/2022   Procedure:  TRANSURETHRAL RESECTION OF BLADDER TUMOR (TURBT);  Surgeon: [name], MD;  Location: [facility];  Service: Urology;  Laterality: N/A;   TRANSURETHRAL RESECTION OF BLADDER TUMOR N/A 03/29/2023   Procedure: TRANSURETHRAL RESECTION OF BLADDER TUMOR (TURBT);  Surgeon: [name], MD;  Location: [facility];  Service: Urology;  Laterality: N/A;   URETEROSCOPY Right 03/29/2023   Procedure: DIAGNOSTIC URETEROSCOPY;  Surgeon: [name], MD;  Location: [facility];  Service: Urology;  Laterality: Right;    SOCIAL HISTORY: Social History   Socioeconomic History   Marital status: Widowed    Spouse name: Not on file   Number of children: Not on file   Years of education: Not on file   Highest education level: Not on file  Occupational History   Not on file  Tobacco Use   Smoking status: Every Day    Current packs/day: 0.25    Types: Cigarettes   Smokeless tobacco: Never  Vaping Use   Vaping status: Never Used  Substance and Sexual Activity   Alcohol use: No   Drug use: No   Sexual activity: Not on file  Other Topics Concern   Not on file  Social History Narrative   Not on file   Social Drivers of Health   Financial Resource Strain: Low Risk  (06/28/2022)   Overall Financial Resource Strain (CARDIA)    Difficulty of Paying Living Expenses: Not very hard  Food Insecurity: No Food Insecurity (12/22/2022)   Hunger Vital Sign    Worried About Running Out of Food in the Last Year: Never true    Ran Out of Food in the Last Year: Never true  Transportation Needs: No Transportation Needs (12/22/2022)   PRAPARE - Administrator, Civil Service (Medical): No    Lack of Transportation (Non-Medical): No  Physical Activity: Not on file  Stress: No Stress Concern Present (06/28/2022)   [employer] of Occupational Health - Occupational Stress Questionnaire    Feeling of Stress : Not at all  Social Connections: Not on file  Intimate Partner Violence: Not At Risk  (12/22/2022)   Humiliation, Afraid, Rape, and Kick questionnaire    Fear of Current or Ex-Partner: No    Emotionally Abused: No    Physically Abused: No    Sexually Abused: No    FAMILY HISTORY: Family History  Problem Relation Age of Onset   Asthma Mother    Heart disease Mother    Hypertension Mother    Diabetes Mother    Stroke Mother    Diabetes Sister    Diabetes Brother     ALLERGIES:  is allergic to ivp dye [iodinated contrast media] and shellfish allergy.  MEDICATIONS:  Current Outpatient Medications  Medication Sig Dispense Refill   albuterol (VENTOLIN HFA) 108 (90 Base) MCG/ACT inhaler Inhale 2 puffs into the lungs every 4 (four) hours as needed for wheezing or shortness of breath.     amLODipine (NORVASC) 10 MG tablet  Take 10 mg by mouth every morning.     cyanocobalamin (VITAMIN B12) 1000 MCG tablet Take 1 tablet (1,000 mcg total) by mouth daily. 90 tablet 0   lidocaine-prilocaine (EMLA) cream Apply 1 Application topically as needed. Apply to port and cover with saran wrap 1-2 hours prior to port access 30 g 1   lisinopril (ZESTRIL) 40 MG tablet Take 40 mg by mouth every morning.     metFORMIN (GLUCOPHAGE) 1000 MG tablet Take 1,000 mg by mouth daily with breakfast.     nitrofurantoin, macrocrystal-monohydrate, (MACROBID) 100 MG capsule Take 1 capsule (100 mg total) by mouth daily. 30 capsule 0   potassium chloride (KLOR-CON) 10 MEQ tablet Take 1 tablet (10 mEq total) by mouth daily. Be sure to take dose on day of surgery. Follow up with PCP or oncology for repeat labs. 30 tablet 1   No current facility-administered medications for this visit.    Review of Systems  Constitutional:  Negative for appetite change, chills, fatigue and fever.  HENT:   Negative for hearing loss and voice change.   Eyes:  Negative for eye problems.  Respiratory:  Positive for shortness of breath. Negative for chest tightness and cough.   Cardiovascular:  Negative for chest pain.   Gastrointestinal:  Negative for abdominal distention, abdominal pain and blood in stool.  Endocrine: Negative for hot flashes.  Genitourinary:  Negative for difficulty urinating and frequency.   Musculoskeletal:  Negative for arthralgias.  Skin:  Negative for itching and rash.  Neurological:  Negative for extremity weakness.  Hematological:  Negative for adenopathy.  Psychiatric/Behavioral:  Negative for confusion.      PHYSICAL EXAMINATION: ECOG PERFORMANCE STATUS: 2 - Symptomatic, <50% confined to bed  Vitals:   06/15/23 0843 06/15/23 0845  BP: (!) 181/90 (!) 168/89  Pulse: (!) 112 (!) 107  Temp: 97.9 F (36.6 C)   SpO2: 100%     Filed Weights   06/15/23 0843  Weight: 117 lb 14.4 oz (53.5 kg)     Physical Exam Constitutional:      General: She is not in acute distress.    Appearance: She is not diaphoretic.  HENT:     Head: Normocephalic.  Eyes:     General: No scleral icterus.    Pupils: Pupils are equal, round, and reactive to light.  Cardiovascular:     Rate and Rhythm: Tachycardia present.     Heart sounds: Murmur heard.  Pulmonary:     Effort: Pulmonary effort is normal. No respiratory distress.     Comments: Decreased breath sounds bilaterally Abdominal:     General: There is no distension.  Musculoskeletal:        General: Normal range of motion.     Cervical back: Normal range of motion.  Skin:    Findings: No erythema.  Neurological:     Mental Status: She is alert and oriented to person, place, and time. Mental status is at baseline.     Cranial Nerves: No cranial nerve deficit.     Motor: No abnormal muscle tone.  Psychiatric:        Mood and Affect: Affect normal.      LABORATORY DATA:  I have reviewed the data as listed    Latest Ref Rng & Units 06/15/2023    8:20 AM 05/25/2023    8:44 AM 05/04/2023    8:36 AM  CBC  WBC 4.0 - 10.5 K/uL 8.8  7.5  6.4   Hemoglobin 12.0 - 15.0  g/dL 84.6  96.2  95.2   Hematocrit 36.0 - 46.0 % 34.1  32.7   31.2   Platelets 150 - 400 K/uL 384  248  349       Latest Ref Rng & Units 05/25/2023    8:44 AM 05/04/2023    8:36 AM 04/29/2023    1:37 PM  CMP  Glucose 70 - 99 mg/dL 841  324  401   BUN 8 - 23 mg/dL 22  9  16    Creatinine 0.44 - 1.00 mg/dL 0.27  2.53  6.64   Sodium 135 - 145 mmol/L 137  138  136   Potassium 3.5 - 5.1 mmol/L 3.3  3.1  3.2   Chloride 98 - 111 mmol/L 103  101  100   CO2 22 - 32 mmol/L 24  27  26    Calcium 8.9 - 10.3 mg/dL 9.4  8.9  8.8   Total Protein 6.5 - 8.1 g/dL 7.1  6.8    Total Bilirubin 0.0 - 1.2 mg/dL 0.3  0.4    Alkaline Phos 38 - 126 U/L 59  64    AST 15 - 41 U/L 19  19    ALT 0 - 44 U/L 14  16       RADIOGRAPHIC STUDIES: I have personally reviewed the radiological images as listed and agreed with the findings in the report. No results found.

## 2023-06-15 NOTE — Assessment & Plan Note (Signed)
 12/21/2022 Non-muscle invasive bladder cancer. S/p TURBT, T1 lesion, high grade.  03/29/2023 Non-muscle invasive bladder cancer. S/p TURBT Patient is getting intravesical BCG treatments-she is also on suppressive antibiotics Follow-up with urology

## 2023-06-15 NOTE — Assessment & Plan Note (Signed)
 Immunotherapy treatment plan as listed above.

## 2023-06-15 NOTE — Assessment & Plan Note (Signed)
 Continue KCL daily

## 2023-06-20 ENCOUNTER — Ambulatory Visit (INDEPENDENT_AMBULATORY_CARE_PROVIDER_SITE_OTHER): Payer: Medicare Other | Admitting: Physician Assistant

## 2023-06-20 DIAGNOSIS — D494 Neoplasm of unspecified behavior of bladder: Secondary | ICD-10-CM

## 2023-06-20 DIAGNOSIS — C679 Malignant neoplasm of bladder, unspecified: Secondary | ICD-10-CM | POA: Diagnosis not present

## 2023-06-20 LAB — URINALYSIS, COMPLETE
Bilirubin, UA: NEGATIVE
Glucose, UA: NEGATIVE
Ketones, UA: NEGATIVE
Nitrite, UA: POSITIVE — AB
Specific Gravity, UA: 1.025 (ref 1.005–1.030)
Urobilinogen, Ur: 0.2 mg/dL (ref 0.2–1.0)
pH, UA: 6.5 (ref 5.0–7.5)

## 2023-06-20 LAB — MICROSCOPIC EXAMINATION
RBC, Urine: >30 /HPF — AB (ref 0–2)
WBC, UA: >30 /HPF — AB (ref 0–5)

## 2023-06-20 MED ORDER — BCG LIVE 50 MG IS SUSR
3.2400 mL | Freq: Once | INTRAVESICAL | Status: AC
  Administered 2023-06-20: 81 mg via INTRAVESICAL

## 2023-06-20 NOTE — Progress Notes (Signed)
 BCG Bladder Instillation  BCG # 4 of 6  Due to Bladder Cancer patient is present today for a BCG treatment. Patient was cleaned and prepped in a sterile fashion with betadine. A 14FR catheter was inserted, urine return was noted , urine was clear in color.  50ml of reconstituted BCG was instilled into the bladder. The catheter was then removed. Patient tolerated well, no complications were noted  Performed by: Carman Ching, PA-C and Ples Specter, CMA  Additional notes: UA is grossly positive today despite suppressive Macrobid but she remains asymptomatic. I continue to suspect colonization. Will send urine for culture in case she develops symptoms.  Follow up: 1 week

## 2023-06-20 NOTE — Addendum Note (Signed)
 Addended by: Debarah Crape on: 06/20/2023 04:34 PM   Modules accepted: Orders

## 2023-06-22 ENCOUNTER — Ambulatory Visit
Admission: RE | Admit: 2023-06-22 | Discharge: 2023-06-22 | Disposition: A | Discharge status: Home / Self Care | Payer: Medicare Other | Source: Ambulatory Visit | Attending: Oncology | Admitting: Oncology

## 2023-06-22 DIAGNOSIS — C3482 Malignant neoplasm of overlapping sites of left bronchus and lung: Secondary | ICD-10-CM | POA: Diagnosis present

## 2023-06-22 MED ORDER — GADOBUTROL 1 MMOL/ML IV SOLN
5.0000 mL | Freq: Once | INTRAVENOUS | Status: AC | PRN
  Administered 2023-06-22: 5 mL via INTRAVENOUS

## 2023-06-24 LAB — CULTURE, URINE COMPREHENSIVE

## 2023-06-27 ENCOUNTER — Ambulatory Visit (INDEPENDENT_AMBULATORY_CARE_PROVIDER_SITE_OTHER): Payer: Medicare Other | Admitting: Physician Assistant

## 2023-06-27 ENCOUNTER — Encounter: Payer: Self-pay | Admitting: Physician Assistant

## 2023-06-27 VITALS — BP 127/76 | HR 102 | Ht 63.0 in | Wt 117.0 lb

## 2023-06-27 DIAGNOSIS — C679 Malignant neoplasm of bladder, unspecified: Secondary | ICD-10-CM

## 2023-06-27 DIAGNOSIS — D494 Neoplasm of unspecified behavior of bladder: Secondary | ICD-10-CM

## 2023-06-27 LAB — URINALYSIS, COMPLETE
Bilirubin, UA: NEGATIVE
Glucose, UA: NEGATIVE
Nitrite, UA: POSITIVE — AB
Specific Gravity, UA: 1.025 (ref 1.005–1.030)
Urobilinogen, Ur: 1 mg/dL (ref 0.2–1.0)
pH, UA: 6.5 (ref 5.0–7.5)

## 2023-06-27 LAB — MICROSCOPIC EXAMINATION
Epithelial Cells (non renal): >10 /HPF — AB (ref 0–10)
RBC, Urine: >30 /HPF — AB (ref 0–2)
WBC, UA: >30 /HPF — AB (ref 0–5)

## 2023-06-27 MED ORDER — BCG LIVE 50 MG IS SUSR: 3.2400 mL | Freq: Once | INTRAVESICAL | Status: DC

## 2023-06-27 MED ORDER — BCG LIVE 50 MG IS SUSR
3.2400 mL | Freq: Once | INTRAVESICAL | Status: DC
  Administered 2023-06-27: 81 mg via INTRAVESICAL

## 2023-06-27 NOTE — Patient Instructions (Signed)
 Your Timeline for Today:  Right now through 5pm: Hold your urine and do your quarter turns every 15 minutes. 5pm-11pm today: Every time you urinate, pour 1/2 cup of bleach into the toilet and let it sit for 15 minutes prior to flushing. 11pm onward: Resume your normal routine.

## 2023-06-27 NOTE — Progress Notes (Signed)
 BCG Bladder Instillation  BCG # 5 of 6  Due to Bladder Cancer patient is present today for a BCG treatment. Patient was cleaned and prepped in a sterile fashion with betadine. A 14FR catheter was inserted, urine return was noted 0ml.  50ml of reconstituted BCG was instilled into the bladder. The catheter was then removed. Patient tolerated well, no complications were noted  Performed by: Carman Ching, PA-C and Benay Pike, CMA  Follow up/ Additional notes: 1 week

## 2023-06-28 ENCOUNTER — Other Ambulatory Visit: Payer: Self-pay | Admitting: Oncology

## 2023-07-01 ENCOUNTER — Encounter: Payer: Self-pay | Admitting: Oncology

## 2023-07-01 ENCOUNTER — Other Ambulatory Visit: Payer: Self-pay

## 2023-07-06 ENCOUNTER — Encounter: Payer: Self-pay | Admitting: Oncology

## 2023-07-06 ENCOUNTER — Inpatient Hospital Stay (HOSPITAL_BASED_OUTPATIENT_CLINIC_OR_DEPARTMENT_OTHER): Payer: Medicare Other | Admitting: Oncology

## 2023-07-06 ENCOUNTER — Inpatient Hospital Stay: Payer: Medicare Other

## 2023-07-06 ENCOUNTER — Telehealth: Payer: Self-pay

## 2023-07-06 ENCOUNTER — Inpatient Hospital Stay: Payer: Medicare Other | Attending: Oncology

## 2023-07-06 VITALS — BP 154/77 | HR 69 | Temp 97.6°F | Resp 14 | Wt 112.0 lb

## 2023-07-06 DIAGNOSIS — Z7982 Long term (current) use of aspirin: Secondary | ICD-10-CM | POA: Diagnosis not present

## 2023-07-06 DIAGNOSIS — E876 Hypokalemia: Secondary | ICD-10-CM | POA: Diagnosis not present

## 2023-07-06 DIAGNOSIS — C3482 Malignant neoplasm of overlapping sites of left bronchus and lung: Secondary | ICD-10-CM | POA: Diagnosis not present

## 2023-07-06 DIAGNOSIS — C771 Secondary and unspecified malignant neoplasm of intrathoracic lymph nodes: Secondary | ICD-10-CM | POA: Insufficient documentation

## 2023-07-06 DIAGNOSIS — C679 Malignant neoplasm of bladder, unspecified: Secondary | ICD-10-CM | POA: Insufficient documentation

## 2023-07-06 DIAGNOSIS — D649 Anemia, unspecified: Secondary | ICD-10-CM | POA: Insufficient documentation

## 2023-07-06 DIAGNOSIS — Z79899 Other long term (current) drug therapy: Secondary | ICD-10-CM | POA: Diagnosis not present

## 2023-07-06 DIAGNOSIS — R634 Abnormal weight loss: Secondary | ICD-10-CM

## 2023-07-06 DIAGNOSIS — C787 Secondary malignant neoplasm of liver and intrahepatic bile duct: Secondary | ICD-10-CM | POA: Diagnosis present

## 2023-07-06 DIAGNOSIS — C3412 Malignant neoplasm of upper lobe, left bronchus or lung: Secondary | ICD-10-CM | POA: Insufficient documentation

## 2023-07-06 DIAGNOSIS — Z86711 Personal history of pulmonary embolism: Secondary | ICD-10-CM | POA: Insufficient documentation

## 2023-07-06 DIAGNOSIS — Z5112 Encounter for antineoplastic immunotherapy: Secondary | ICD-10-CM | POA: Diagnosis not present

## 2023-07-06 DIAGNOSIS — I639 Cerebral infarction, unspecified: Secondary | ICD-10-CM

## 2023-07-06 LAB — CMP (CANCER CENTER ONLY)
ALT: 14 U/L (ref 0–44)
AST: 22 U/L (ref 15–41)
Albumin: 3.8 g/dL (ref 3.5–5.0)
Alkaline Phosphatase: 59 U/L (ref 38–126)
Anion gap: 8 (ref 5–15)
BUN: 18 mg/dL (ref 8–23)
CO2: 26 mmol/L (ref 22–32)
Calcium: 9 mg/dL (ref 8.9–10.3)
Chloride: 102 mmol/L (ref 98–111)
Creatinine: 1.12 mg/dL — ABNORMAL HIGH (ref 0.44–1.00)
GFR, Estimated: 49 mL/min — ABNORMAL LOW (ref >60)
Glucose, Bld: 172 mg/dL — ABNORMAL HIGH (ref 70–99)
Potassium: 3.4 mmol/L — ABNORMAL LOW (ref 3.5–5.1)
Sodium: 136 mmol/L (ref 135–145)
Total Bilirubin: 0.5 mg/dL (ref 0.0–1.2)
Total Protein: 6.9 g/dL (ref 6.5–8.1)

## 2023-07-06 LAB — CBC WITH DIFFERENTIAL (CANCER CENTER ONLY)
Abs Immature Granulocytes: 0.02 10*3/uL (ref 0.00–0.07)
Basophils Absolute: 0 10*3/uL (ref 0.0–0.1)
Basophils Relative: 1 %
Eosinophils Absolute: 0.3 10*3/uL (ref 0.0–0.5)
Eosinophils Relative: 5 %
HCT: 34.1 % — ABNORMAL LOW (ref 36.0–46.0)
Hemoglobin: 10.9 g/dL — ABNORMAL LOW (ref 12.0–15.0)
Immature Granulocytes: 0 %
Lymphocytes Relative: 30 %
Lymphs Abs: 1.7 10*3/uL (ref 0.7–4.0)
MCH: 28.4 pg (ref 26.0–34.0)
MCHC: 32 g/dL (ref 30.0–36.0)
MCV: 88.8 fL (ref 80.0–100.0)
Monocytes Absolute: 0.4 10*3/uL (ref 0.1–1.0)
Monocytes Relative: 7 %
Neutro Abs: 3.2 10*3/uL (ref 1.7–7.7)
Neutrophils Relative %: 57 %
Platelet Count: 301 10*3/uL (ref 150–400)
RBC: 3.84 MIL/uL — ABNORMAL LOW (ref 3.87–5.11)
RDW: 17.3 % — ABNORMAL HIGH (ref 11.5–15.5)
WBC Count: 5.6 10*3/uL (ref 4.0–10.5)
nRBC: 0 % (ref 0.0–0.2)

## 2023-07-06 LAB — TSH: TSH: 2.246 u[IU]/mL (ref 0.350–4.500)

## 2023-07-06 MED ORDER — HEPARIN SOD (PORK) LOCK FLUSH 100 UNIT/ML IV SOLN
500.0000 [IU] | Freq: Once | INTRAVENOUS | Status: AC | PRN
  Administered 2023-07-06: 500 [IU]
  Filled 2023-07-06: qty 5

## 2023-07-06 MED ORDER — ASPIRIN 81 MG PO TBEC: 81.0000 mg | DELAYED_RELEASE_TABLET | Freq: Every day | ORAL | 12 refills | Status: AC

## 2023-07-06 MED ORDER — SODIUM CHLORIDE 0.9% FLUSH
10.0000 mL | INTRAVENOUS | Status: DC | PRN
  Administered 2023-07-06: 10 mL via INTRAVENOUS
  Filled 2023-07-06: qty 10

## 2023-07-06 MED ORDER — SODIUM CHLORIDE 0.9 % IV SOLN
200.0000 mg | Freq: Once | INTRAVENOUS | Status: AC
  Administered 2023-07-06: 200 mg via INTRAVENOUS
  Filled 2023-07-06: qty 200

## 2023-07-06 MED ORDER — SODIUM CHLORIDE 0.9 % IV SOLN
Freq: Once | INTRAVENOUS | Status: AC
  Filled 2023-07-06: qty 250

## 2023-07-06 NOTE — Patient Instructions (Signed)
 [facility] - A DEPT OF [facility]  Discharge Instructions: Thank you for choosing [facility] to provide your oncology and hematology care.  If you have a lab appointment with the Cancer Center, please go directly to the Cancer Center and check in at the registration area.  Wear comfortable clothing and clothing appropriate for easy access to any Portacath or PICC line.   We strive to give you quality time with your provider. You may need to reschedule your appointment if you arrive late (15 or more minutes).  Arriving late affects you and other patients whose appointments are after yours.  Also, if you miss three or more appointments without notifying the office, you may be dismissed from the clinic at the provider's discretion.      For prescription refill requests, have your pharmacy contact our office and allow 72 hours for refills to be completed.    Today you received the following chemotherapy and/or immunotherapy agents: KEYTRUDA    To help prevent nausea and vomiting after your treatment, we encourage you to take your nausea medication as directed.  BELOW ARE SYMPTOMS THAT SHOULD BE REPORTED IMMEDIATELY: *FEVER GREATER THAN 100.4 F (38 C) OR HIGHER *CHILLS OR SWEATING *NAUSEA AND VOMITING THAT IS NOT CONTROLLED WITH YOUR NAUSEA MEDICATION *UNUSUAL SHORTNESS OF BREATH *UNUSUAL BRUISING OR BLEEDING *URINARY PROBLEMS (pain or burning when urinating, or frequent urination) *BOWEL PROBLEMS (unusual diarrhea, constipation, pain near the anus) TENDERNESS IN MOUTH AND THROAT WITH OR WITHOUT PRESENCE OF ULCERS (sore throat, sores in mouth, or a toothache) UNUSUAL RASH, SWELLING OR PAIN  UNUSUAL VAGINAL DISCHARGE OR ITCHING   Items with * indicate a potential emergency and should be followed up as soon as possible or go to the Emergency Department if any problems should occur.  Please show the CHEMOTHERAPY ALERT CARD or IMMUNOTHERAPY  ALERT CARD at check-in to the Emergency Department and triage nurse.  Should you have questions after your visit or need to cancel or reschedule your appointment, please contact [facility] - A DEPT OF [facility]  [phone] and follow the prompts.  Office hours are 8:00 a.m. to 4:30 p.m. Monday - Friday. Please note that voicemails left after 4:00 p.m. may not be returned until the following business day.  We are closed weekends and major holidays. You have access to a nurse at all times for urgent questions. Please call the main number to the clinic [phone] and follow the prompts.  For any non-urgent questions, you may also contact your provider using MyChart. We now offer e-Visits for anyone 72 and older to request care online for non-urgent symptoms. For details visit [email or web address].   Also download the MyChart app! Go to the app store, search "MyChart", open the app, select [place], and log in with your MyChart username and password.

## 2023-07-06 NOTE — Assessment & Plan Note (Signed)
 12/21/2022 Non-muscle invasive bladder cancer. S/p TURBT, T1 lesion, high grade.  03/29/2023 Non-muscle invasive bladder cancer. S/p TURBT Patient is s/p intravesical BCG treatments-she is also on suppressive antibiotics Follow-up with urology

## 2023-07-06 NOTE — Progress Notes (Signed)
 Hematology/Oncology Progress note [phone] [phone]       REASON OF VISIT Stage IV Lung squamous cell carcinoma  ASSESSMENT & PLAN:   Cancer Staging  Malignant neoplasm of lung (HCC) Staging form: Lung, AJCC 8th Edition - Clinical: Stage IVA (cT4, cN3, cM1a) - Signed by Rickard Patience, MD on 06/28/2022  Urothelial carcinoma of bladder [facility]) Staging form: Urinary Bladder, AJCC 8th Edition - Clinical stage from 12/21/2022: Stage I (cT1, cN0, cM0) - Signed by Rickard Patience, MD on 12/29/2022   Malignant neoplasm of lung (HCC) Non-small cell lung cancer, favor squamous cell carcinoma, stage IV- TPS 90%, TMB 10, KRAS G12V MRI brain with and without contrast -  Left occipital focus of prior hemorrhage with mild linear contrast enhancement. Possible small metastasis or microhemmorhagic event.  TPS 90%, CT in Jan 2025 showed stable disease Labs are reviewed and discussed with patient.  Proceed with [name]     Encounter for antineoplastic immunotherapy Immunotherapy treatment plan as listed above.  History of pulmonary embolism off  Elqiuis 5mg  BID due to GI bleeding  Hypokalemia Continue KCL daily   Normocytic anemia Chronic anemia.  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  03/29/2023 Non-muscle invasive bladder cancer. S/p TURBT Patient is s/p intravesical BCG treatments-she is also on suppressive antibiotics Follow-up with urology  CVA (cerebral vascular accident) [facility]) New small acute infarcts on MRI brain.  She is asymptomatic, neurological exam grossly intact.  Recommend patient to start Aspirin 81mg  daily.  Check A1c and lipid panel. Recommend patient to make a follow up appointment with PCP.  Refer to Dr. Barbaraann Cao.    Weight loss Continue nutrition supplement     Orders Placed This Encounter  Procedures   CBC with Differential (Cancer  Center Only)    Standing Status:   Future    Expected Date:   08/17/2023    Expiration Date:   08/16/2024   CMP (Cancer Center only)    Standing Status:   Future    Expected Date:   08/17/2023    Expiration Date:   08/16/2024   Lipid panel    Standing Status:   Future    Expected Date:   07/06/2023    Expiration Date:   07/05/2024   Hemoglobin A1c    Standing Status:   Future    Expected Date:   07/06/2023    Expiration Date:   07/05/2024   Amb Referral to Neuro Oncology    Referral Priority:   Routine    Referral Type:   Consultation    Referral Reason:   Specialty Services Required    Number of Visits Requested:   1   Follow up in 3 weeks.   All questions were answered. The patient knows to call the clinic with any problems, questions or concerns.  Rickard Patience, MD, PhD [facility] Hematology Oncology 07/06/2023       HISTORY OF PRESENTING ILLNESS:  Regina Perkins 83 y.o. 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 I have reviewed her chart and materials related to her cancer extensively and collaborated history with the patient. Summary of oncologic history is as follows: Oncology History  Malignant neoplasm of lung (HCC)  05/24/2022 Imaging   CT chest angiogram showed 1. No evidence for pulmonary embolism. 2. Left upper lobe/prevascular mass worrisome for neoplasm. 3.  Mediastinal and hilar lymphadenopathy. 4. Multiple pulmonary nodules measuring up to 11 mm worrisome for metastatic disease. 5. Small left pleural effusion. 6. Patchy ground-glass and airspace opacities in the left upper lobe worrisome for infection. 7. Moderate-sized pericardial effusion. 8. Cholelithiasis. 9. Nonobstructing left renal calculus   05/25/2022 Imaging   CT abdomen pelvis wo contrast 1. No acute findings or explanation for the patient's symptoms. 2. No evidence of primary malignancy or metastatic  disease within the abdomen or pelvis on noncontrast imaging.  3. Cholelithiasis without evidence of cholecystitis or biliary dilatation. 4. Nonobstructing left renal calculus. 5. Moderate stool throughout the colon suggesting constipation. 6.  Aortic Atherosclerosis    05/26/2022 Initial Diagnosis   Malignant neoplasm of lung - TPS 90%, TMB 10, KRAS G12V  -05/22/2022 in the emergency room, she was found to have hypoxia with oxygen levels of 86% on room air, improved to 94 with 2 L of oxygen.  D-dimer was elevated at 1.29, troponin negative.  Lower extremity Doppler was negative for DVT.  VQ scan high probability PE (Large segmental perfusion defect of the left upper lobe without corresponding radiographic abnormality. Additional bilateral wedge-shaped subsegmental branch perfusion defects .  Patient was admitted and started on heparin Echocardiogram showed no RV strain.  Moderate pericardial effusion. Over her hospitalization, she continues to have shortness of breath with minimal exertion. 05/24/2022, CT chest angiogram showed left lung mass with bilateral lung nodules.   06/16/2022 Imaging   PET scan  1. 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 No evidence of metastatic disease in the abdomen or pelvis. 2. Moderate pericardial effusion. 3. Tiny left pleural effusion. 4. Left renal stone. 5. Aortic atherosclerosis (ICD10-I70.0). Coronary artery calcification. 6. Enlarged pulmonic trunk, indicative of pulmonary arterial hypertension    06/21/2022 Procedure   US guided liver right supraclvicular node biopsy showed Metastatic poorly differentiated carcinoma, favor squamous cell carcinoma, probably pulmonary origin.    06/28/2022  Cancer Staging   Staging form: Lung, AJCC 8th Edition - Clinical: Stage IVA (cT4, cN3, cM1a) - Signed by Rickard Patience, MD on 06/28/2022   07/14/2022 -  Chemotherapy   Patient is on Treatment Plan : LUNG NSCLC Pembrolizumab (200) q21d     09/29/2022 Imaging   CT chest abdomen pelvis w contrast showed Bilateral pulmonary nodules, some of which are waxing/waning, but favoring multifocal lung carcinoma/metastases.   Improving thoracic nodal metastases, as above.  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 Consider cystoscopy as clinically warranted.  Aortic Atherosclerosis (ICD10-I70.0) and Emphysema (ICD10-J43.9)   01/06/2023 Imaging   CT chest abdomen pelvis w contrast showed 1. Interval decrease in size of multiple spiculated and subsolid pulmonary nodules, consistent with treatment response. 2. No significant change in matted post treatment prevascular and left superior mediastinal soft tissue. 3. Diminished size of an endoluminal bladder mass seen on prior examination, soft tissue in this vicinity now measuring no greater than 2.1 x 1.4 cm, previously at least 4.7 x 2.1 cm. Presumably this has been at least partially resected. 4. No evidence of lymphadenopathy or metastatic disease in the abdomen or pelvis. 5. Cholelithiasis. 6. Nonobstructive left nephrolithiasis. 7. Coronary artery disease.   01/10/2023 Procedure   Medi port placed by IR   05/11/2023 Imaging   CT chest abdomen pelvis w contrast showed 1. Unchanged matted, treated prevascular  and left superior mediastinal soft tissue. 2. Multiple bilateral spiculated and ground-glass pulmonary nodules not significantly changed. 3. No residual mass of the posterior right aspect of the urinary bladder. Mild residual thickening in this vicinity. Interval placement of right-sided double-J ureteral stent catheter. 4. No evidence of lymphadenopathy or metastatic disease in the abdomen or pelvis. 5. Emphysema and diffuse bilateral bronchial  wall thickening. 6. Coronary artery disease.   Aortic Atherosclerosis (ICD10-I70.0) and Emphysema (ICD10-J43.9).    Urothelial carcinoma of bladder (HCC)  10/06/2022 Initial Diagnosis   Urothelial carcinoma of bladder S/p TURBT  1. 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 Bladder, biopsy, Base of tumor HIGH GRADE PAPILLARY UROTHELIAL CARCINOMA SUBMUCOSA AND MUSCULARIS PROPRIA IS PRESENT AND NOT INVOLVED BY CARCINOMA  Postprocedure, patient developed gross hematuria and was admitted to the hospital.  She received PRBC transfusion.  Hemoglobin improved and she was discharged.    12/21/2022 Cancer Staging   Staging form: Urinary Bladder, AJCC 8th Edition - Clinical stage from 12/21/2022: Stage I (cT1, cN0, cM0) - Signed by Rickard Patience, MD on 12/29/2022 WHO/ISUP grade (low/high): High Grade Histologic grading system: 2 grade system     INTERVAL HISTORY Malvina L Yapp is a 83 y.o. female who has above history reviewed by me today presents for follow up visit for Lung squamous cell carcinoma She tolerates [place] well. Denies skin rash, diarrhea.  -cystoscopy, non invasive high grade papillary urothelial carcinomas s/p TURBT, s/p  intravesical BCGs suppressive Macrobid 100mg  daily  for UTI prevention  Patient has no new complaints.  Denies chest pain, chest palpitation. She has lost weight.  She denies headache, focal weakness or slurred speech.   MEDICAL HISTORY:  Past Medical History:  Diagnosis Date   Atherosclerosis of native arteries of extremity with intermittent claudication (HCC)    Bladder mass    Chronic kidney disease, stage 3a (HCC)    COPD (chronic obstructive pulmonary disease) (HCC)    Diabetes mellitus without complication (HCC)    Gastrointestinal hemorrhage    GERD  (gastroesophageal reflux disease)    Glaucoma    History of duodenal ulcer    HTN (hypertension)    Hypertension    Hypokalemia    Hypomagnesemia    Leukocytosis    Lung mass    Malignant neoplasm of lung (HCC) 06/28/2022   a.) stage IVA (cT4, cN3, cM1a) favor squamous cell carcinoma --> TPS 90%, TMB 10, KRAS G12V   Malnutrition (HCC)    Normocytic anemia    Pericardial effusion 05/2022   Pulmonary embolism (HCC)    Sepsis (HCC)    Tobacco abuse    Type II diabetes mellitus with renal manifestations (HCC)    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   a.) stage I (cT1, cN0, cM0)    SURGICAL HISTORY: Past Surgical History:  Procedure Laterality Date   ABDOMINAL HYSTERECTOMY     BACK SURGERY     1988 and 1998   CYSTOSCOPY W/ RETROGRADES Bilateral 12/21/2022   Procedure: CYSTOSCOPY WITH RETROGRADE PYELOGRAM;  Surgeon: [name], MD;  Location: [facility];  Service: Urology;  Laterality: Bilateral;   CYSTOSCOPY WITH BIOPSY N/A 03/29/2023   Procedure: CYSTOSCOPY WITH BLADDER BIOPSY;  Surgeon: [name], MD;  Location: [facility];  Service: Urology;  Laterality: N/A;   CYSTOSCOPY WITH STENT PLACEMENT Right 03/29/2023   Procedure: CYSTOSCOPY  WITH STENT PLACEMENT;  Surgeon: [name], MD;  Location: [facility];  Service: Urology;  Laterality: Right;   ESOPHAGOGASTRODUODENOSCOPY (EGD) WITH PROPOFOL N/A 07/29/2022   Procedure: ESOPHAGOGASTRODUODENOSCOPY (EGD) WITH PROPOFOL;  Surgeon: Wyline Mood, MD;  Location: [facility] ENDOSCOPY;  Service: Gastroenterology;  Laterality: N/A;   ESOPHAGOGASTRODUODENOSCOPY (EGD) WITH PROPOFOL N/A 01/21/2023   Procedure: ESOPHAGOGASTRODUODENOSCOPY (EGD) WITH PROPOFOL;  Surgeon: Wyline Mood, MD;  Location: [facility] ENDOSCOPY;  Service: Gastroenterology;  Laterality: N/A;   HOLMIUM LASER APPLICATION Right 03/29/2023   Procedure: HOLMIUM LASER ABLATION;  Surgeon: [name], MD;  Location: [facility];  Service: Urology;  Laterality: Right;   IR  IMAGING GUIDED PORT INSERTION  01/10/2023   TRANSURETHRAL RESECTION OF BLADDER TUMOR N/A 12/21/2022   Procedure: TRANSURETHRAL RESECTION OF BLADDER TUMOR (TURBT);  Surgeon: [name], MD;  Location: [facility];  Service: Urology;  Laterality: N/A;   TRANSURETHRAL RESECTION OF BLADDER TUMOR N/A 03/29/2023   Procedure: TRANSURETHRAL RESECTION OF BLADDER TUMOR (TURBT);  Surgeon: [name], MD;  Location: [facility];  Service: Urology;  Laterality: N/A;   URETEROSCOPY Right 03/29/2023   Procedure: DIAGNOSTIC URETEROSCOPY;  Surgeon: [name], MD;  Location: [facility];  Service: Urology;  Laterality: Right;    SOCIAL HISTORY: Social History   Socioeconomic History   Marital status: Widowed    Spouse name: Not on file   Number of children: Not on file   Years of education: Not on file   Highest education level: Not on file  Occupational History   Not on file  Tobacco Use   Smoking status: Every Day    Current packs/day: 0.25    Types: Cigarettes   Smokeless tobacco: Never  Vaping Use   Vaping status: Never Used  Substance and Sexual Activity   Alcohol use: No   Drug use: No   Sexual activity: Not on file  Other Topics Concern   Not on file  Social History Narrative   Not on file   Social Drivers of Health   Financial Resource Strain: Low Risk  (06/28/2022)   Overall Financial Resource Strain (CARDIA)    Difficulty of Paying Living Expenses: Not very hard  Food Insecurity: No Food Insecurity (12/22/2022)   Hunger Vital Sign    Worried About Running Out of Food in the Last Year: Never true    Ran Out of Food in the Last Year: Never true  Transportation Needs: No Transportation Needs (12/22/2022)   PRAPARE - Administrator, Civil Service (Medical): No    Lack of Transportation (Non-Medical): No  Physical Activity: Not on file  Stress: No Stress Concern Present (06/28/2022)   [employer] of Occupational Health - Occupational Stress Questionnaire     Feeling of Stress : Not at all  Social Connections: Not on file  Intimate Partner Violence: Not At Risk (12/22/2022)   Humiliation, Afraid, Rape, and Kick questionnaire    Fear of Current or Ex-Partner: No    Emotionally Abused: No    Physically Abused: No    Sexually Abused: No    FAMILY HISTORY: Family History  Problem Relation Age of Onset   Asthma Mother    Heart disease Mother    Hypertension Mother    Diabetes Mother    Stroke Mother    Diabetes Sister    Diabetes Brother     ALLERGIES:  is allergic to ivp dye [iodinated contrast media] and shellfish allergy.  MEDICATIONS:  Current Outpatient Medications  Medication  Sig Dispense Refill   aspirin EC 81 MG tablet Take 1 tablet (81 mg total) by mouth daily. Swallow whole. 30 tablet 12   albuterol (VENTOLIN HFA) 108 (90 Base) MCG/ACT inhaler Inhale 2 puffs into the lungs every 4 (four) hours as needed for wheezing or shortness of breath.     amLODipine (NORVASC) 10 MG tablet Take 10 mg by mouth every morning.     cyanocobalamin (VITAMIN B12) 1000 MCG tablet Take 1 tablet (1,000 mcg total) by mouth daily. 90 tablet 0   lidocaine-prilocaine (EMLA) cream Apply 1 Application topically as needed. Apply to port and cover with saran wrap 1-2 hours prior to port access 30 g 1   lisinopril (ZESTRIL) 40 MG tablet Take 40 mg by mouth every morning.     metFORMIN (GLUCOPHAGE) 1000 MG tablet Take 1,000 mg by mouth daily with breakfast.     nitrofurantoin, macrocrystal-monohydrate, (MACROBID) 100 MG capsule Take 1 capsule (100 mg total) by mouth daily. 30 capsule 0   potassium chloride (KLOR-CON) 10 MEQ tablet TAKE 1 TABLET BY MOUTH DAILY. BE SURE TO TAKE DOSE ON DAY OF SURGERY 90 tablet 0   trimethoprim (TRIMPEX) 100 MG tablet Take 1 tablet (100 mg total) by mouth daily. 30 tablet 0   Current Facility-Administered Medications  Medication Dose Route Frequency Provider Last Rate Last Admin   bcg vaccine injection 81 mg  3.24 mL Bladder  Instillation Once        Facility-Administered Medications Ordered in Other Visits  Medication Dose Route Frequency Provider Last Rate Last Admin   sodium chloride flush (NS) 0.9 % injection 10 mL  10 mL Intravenous PRN Rickard Patience, MD   10 mL at 07/06/23 8657    Review of Systems  Constitutional:  Positive for unexpected weight change. Negative for appetite change, chills, fatigue and fever.  HENT:   Negative for hearing loss and voice change.   Eyes:  Negative for eye problems.  Respiratory:  Positive for shortness of breath. Negative for chest tightness and cough.   Cardiovascular:  Negative for chest pain.  Gastrointestinal:  Negative for abdominal distention, abdominal pain and blood in stool.  Endocrine: Negative for hot flashes.  Genitourinary:  Negative for difficulty urinating and frequency.   Musculoskeletal:  Negative for arthralgias.  Skin:  Negative for itching and rash.  Neurological:  Negative for extremity weakness.  Hematological:  Negative for adenopathy.  Psychiatric/Behavioral:  Negative for confusion.      PHYSICAL EXAMINATION: ECOG PERFORMANCE STATUS: 2 - Symptomatic, <50% confined to bed  Vitals:   07/06/23 0909  BP: (!) 154/77  Pulse: 69  Resp: 14  Temp: 97.6 F (36.4 C)  SpO2: 99%    Filed Weights   07/06/23 0909  Weight: 112 lb (50.8 kg)     Physical Exam Constitutional:      General: She is not in acute distress.    Appearance: She is not diaphoretic.  HENT:     Head: Normocephalic.  Eyes:     General: No scleral icterus.    Pupils: Pupils are equal, round, and reactive to light.  Cardiovascular:     Rate and Rhythm: Tachycardia present.     Heart sounds: Murmur heard.  Pulmonary:     Effort: Pulmonary effort is normal. No respiratory distress.     Comments: Decreased breath sounds bilaterally Abdominal:     General: There is no distension.  Musculoskeletal:        General: Normal range of motion.  Cervical back: Normal range of  motion.  Skin:    Findings: No erythema.  Neurological:     Mental Status: She is alert and oriented to person, place, and time. Mental status is at baseline.     Cranial Nerves: No cranial nerve deficit.     Motor: No abnormal muscle tone.  Psychiatric:        Mood and Affect: Affect normal.      LABORATORY DATA:  I have reviewed the data as listed    Latest Ref Rng & Units 07/06/2023    8:38 AM 06/15/2023    8:20 AM 05/25/2023    8:44 AM  CBC  WBC 4.0 - 10.5 K/uL 5.6  8.8  7.5   Hemoglobin 12.0 - 15.0 g/dL 78.2  95.6  21.3   Hematocrit 36.0 - 46.0 % 34.1  34.1  32.7   Platelets 150 - 400 K/uL 301  384  248       Latest Ref Rng & Units 07/06/2023    8:38 AM 06/15/2023    8:20 AM 05/25/2023    8:44 AM  CMP  Glucose 70 - 99 mg/dL 086  578  469   BUN 8 - 23 mg/dL 18  13  22    Creatinine 0.44 - 1.00 mg/dL 6.29  5.28  4.13   Sodium 135 - 145 mmol/L 136  133  137   Potassium 3.5 - 5.1 mmol/L 3.4  3.5  3.3   Chloride 98 - 111 mmol/L 102  96  103   CO2 22 - 32 mmol/L 26  24  24    Calcium 8.9 - 10.3 mg/dL 9.0  9.2  9.4   Total Protein 6.5 - 8.1 g/dL 6.9  6.9  7.1   Total Bilirubin 0.0 - 1.2 mg/dL 0.5  0.4  0.3   Alkaline Phos 38 - 126 U/L 59  60  59   AST 15 - 41 U/L 22  24  19    ALT 0 - 44 U/L 14  12  14       RADIOGRAPHIC STUDIES: I have personally reviewed the radiological images as listed and agreed with the findings in the report. MR Brain W Wo Contrast Result Date: 07/05/2023 CLINICAL DATA:  Provided history: Malignant neoplasm of overlapping sites of left lung. Lung cancer. EXAM: MRI HEAD WITHOUT AND WITH CONTRAST TECHNIQUE: Multiplanar, multiecho pulse sequences of the brain and surrounding structures were obtained without and with intravenous contrast. CONTRAST:  5mL GADAVIST GADOBUTROL 1 MMOL/ML IV SOLN COMPARISON:  Brain MRI 09/16/2022. FINDINGS: Brain: Generalized cerebral and cerebellar atrophy. 4 mm acute infarct within the right lentiform nucleus (series 6, image  25). 2 mm acute infarct within the medial right temporal lobe (series 6, image 19). As compared to the prior brain MRI of 08/27/2022, unchanged appearance of a small focus of hemosiderin deposition in the left occipital lobe. No convincing enhancement is present at this site. No associated mass effect or surrounding edema. No pathologic intracranial enhancement elsewhere. Unchanged chronic infarcts within the corpus callosum, left corona radiata, within/about the bilateral deep gray nuclei and within the right middle cerebellar peduncle. Background moderate-to-advanced patchy and confluent T2 FLAIR hyperintense signal abnormality within the cerebral white matter, nonspecific but compatible with chronic small vessel ischemic disease. New punctate chronic microhemorrhage within the right thalamus. No extra-axial fluid collection. No midline shift. Vascular: Maintained flow voids within the proximal large arterial vessels. Skull and upper cervical spine: No focal worrisome marrow lesion. Incompletely assessed cervical spondylosis.  Sinuses/Orbits: No mass or acute finding within the imaged orbits. Prior right ocular lens replacement. No significant paranasal sinus disease. Other: Fluid within the bilateral mastoid air cells (small-volume right, trace left). Impressions #3 and #4 will be called to the ordering clinician or representative by the Radiologist Assistant, and communication documented in the PACS or [employer]. IMPRESSION: 1. No evidence of active intracranial metastatic disease. 2. Unchanged small nonspecific focus of chronic hemorrhage within the left occipital lobe. No associated pathologic enhancement, edema or mass effect at this site. 3. 4 mm acute infarct within the right lentiform nucleus. 4. 2 mm acute infarct within the medial right temporal lobe. 5. Background parenchymal atrophy and fairly advanced chronic small vessel ischemic disease with chronic infarcts, as described. 6. Punctate chronic  microhemorrhage within the right thalamus, new from the prior MRI. 7. Fluid within the bilateral mastoid air cells (small-volume right, trace left). Electronically Signed   By: Jackey Loge D.O.   On: 07/05/2023 16:42

## 2023-07-06 NOTE — Assessment & Plan Note (Signed)
 Continue nutrition supplement.

## 2023-07-06 NOTE — Assessment & Plan Note (Signed)
 Chronic anemia.  Hemoglobin is stable

## 2023-07-06 NOTE — Assessment & Plan Note (Signed)
 New small acute infarcts on MRI brain.  She is asymptomatic, neurological exam grossly intact.  Recommend patient to start Aspirin 81mg  daily.  Check A1c and lipid panel. Recommend patient to make a follow up appointment with PCP.  Refer to Dr. Barbaraann Cao.

## 2023-07-06 NOTE — Telephone Encounter (Signed)
 Call report of brain MRI obtained on 06/21/22 (please see results).  Patient is scheduled to see Dr. Cathie Hoops today.

## 2023-07-06 NOTE — Assessment & Plan Note (Signed)
 Non-small cell lung cancer, favor squamous cell carcinoma, stage IV- TPS 90%, TMB 10, KRAS G12V MRI brain with and without contrast -  Left occipital focus of prior hemorrhage with mild linear contrast enhancement. Possible small metastasis or microhemmorhagic event.  TPS 90%, CT in Jan 2025 showed stable disease Labs are reviewed and discussed with patient.  Proceed with [name]

## 2023-07-06 NOTE — Assessment & Plan Note (Signed)
 Continue KCL daily

## 2023-07-06 NOTE — Assessment & Plan Note (Signed)
 Immunotherapy treatment plan as listed above.

## 2023-07-06 NOTE — Progress Notes (Signed)
 Patient is starting to lose weight, but she still has a great appetite. She had an MRI 06/22/2023. Patient's Systolic was at 155 this morning, which she told me that she forgot to take her BP medication this morning, and she assured me that she would take it as soon as she gets home.

## 2023-07-06 NOTE — Assessment & Plan Note (Addendum)
 off  Elqiuis 5mg  BID due to GI bleeding

## 2023-07-07 LAB — T4: T4, Total: 8.9 ug/dL (ref 4.5–12.0)

## 2023-07-27 ENCOUNTER — Encounter: Payer: Self-pay | Admitting: Oncology

## 2023-07-27 ENCOUNTER — Inpatient Hospital Stay: Payer: Medicare Other

## 2023-07-27 ENCOUNTER — Inpatient Hospital Stay: Payer: Medicare Other | Attending: Oncology

## 2023-07-27 ENCOUNTER — Inpatient Hospital Stay

## 2023-07-27 ENCOUNTER — Inpatient Hospital Stay (HOSPITAL_BASED_OUTPATIENT_CLINIC_OR_DEPARTMENT_OTHER): Payer: Medicare Other | Admitting: Oncology

## 2023-07-27 VITALS — BP 148/79 | HR 80 | Temp 97.8°F

## 2023-07-27 DIAGNOSIS — Z86711 Personal history of pulmonary embolism: Secondary | ICD-10-CM | POA: Diagnosis not present

## 2023-07-27 DIAGNOSIS — C771 Secondary and unspecified malignant neoplasm of intrathoracic lymph nodes: Secondary | ICD-10-CM | POA: Diagnosis not present

## 2023-07-27 DIAGNOSIS — Z5112 Encounter for antineoplastic immunotherapy: Secondary | ICD-10-CM

## 2023-07-27 DIAGNOSIS — C679 Malignant neoplasm of bladder, unspecified: Secondary | ICD-10-CM | POA: Diagnosis not present

## 2023-07-27 DIAGNOSIS — C3482 Malignant neoplasm of overlapping sites of left bronchus and lung: Secondary | ICD-10-CM

## 2023-07-27 DIAGNOSIS — E876 Hypokalemia: Secondary | ICD-10-CM | POA: Insufficient documentation

## 2023-07-27 DIAGNOSIS — Z7982 Long term (current) use of aspirin: Secondary | ICD-10-CM | POA: Diagnosis not present

## 2023-07-27 DIAGNOSIS — R634 Abnormal weight loss: Secondary | ICD-10-CM

## 2023-07-27 DIAGNOSIS — C3412 Malignant neoplasm of upper lobe, left bronchus or lung: Secondary | ICD-10-CM | POA: Insufficient documentation

## 2023-07-27 DIAGNOSIS — Z79899 Other long term (current) drug therapy: Secondary | ICD-10-CM | POA: Insufficient documentation

## 2023-07-27 DIAGNOSIS — D649 Anemia, unspecified: Secondary | ICD-10-CM

## 2023-07-27 DIAGNOSIS — F17210 Nicotine dependence, cigarettes, uncomplicated: Secondary | ICD-10-CM | POA: Diagnosis not present

## 2023-07-27 DIAGNOSIS — Z8673 Personal history of transient ischemic attack (TIA), and cerebral infarction without residual deficits: Secondary | ICD-10-CM | POA: Insufficient documentation

## 2023-07-27 DIAGNOSIS — C787 Secondary malignant neoplasm of liver and intrahepatic bile duct: Secondary | ICD-10-CM | POA: Diagnosis present

## 2023-07-27 DIAGNOSIS — I639 Cerebral infarction, unspecified: Secondary | ICD-10-CM

## 2023-07-27 DIAGNOSIS — C7931 Secondary malignant neoplasm of brain: Secondary | ICD-10-CM | POA: Insufficient documentation

## 2023-07-27 LAB — CMP (CANCER CENTER ONLY)
ALT: 14 U/L (ref 0–44)
AST: 23 U/L (ref 15–41)
Albumin: 3.8 g/dL (ref 3.5–5.0)
Alkaline Phosphatase: 56 U/L (ref 38–126)
Anion gap: 8 (ref 5–15)
BUN: 17 mg/dL (ref 8–23)
CO2: 25 mmol/L (ref 22–32)
Calcium: 9.2 mg/dL (ref 8.9–10.3)
Chloride: 102 mmol/L (ref 98–111)
Creatinine: 1.13 mg/dL — ABNORMAL HIGH (ref 0.44–1.00)
GFR, Estimated: 49 mL/min — ABNORMAL LOW (ref >60)
Glucose, Bld: 120 mg/dL — ABNORMAL HIGH (ref 70–99)
Potassium: 3.6 mmol/L (ref 3.5–5.1)
Sodium: 135 mmol/L (ref 135–145)
Total Bilirubin: 0.4 mg/dL (ref 0.0–1.2)
Total Protein: 6.8 g/dL (ref 6.5–8.1)

## 2023-07-27 LAB — CBC WITH DIFFERENTIAL (CANCER CENTER ONLY)
Abs Immature Granulocytes: 0.03 10*3/uL (ref 0.00–0.07)
Basophils Absolute: 0 10*3/uL (ref 0.0–0.1)
Basophils Relative: 0 %
Eosinophils Absolute: 0.3 10*3/uL (ref 0.0–0.5)
Eosinophils Relative: 4 %
HCT: 35.1 % — ABNORMAL LOW (ref 36.0–46.0)
Hemoglobin: 11.2 g/dL — ABNORMAL LOW (ref 12.0–15.0)
Immature Granulocytes: 0 %
Lymphocytes Relative: 22 %
Lymphs Abs: 1.6 10*3/uL (ref 0.7–4.0)
MCH: 28.6 pg (ref 26.0–34.0)
MCHC: 31.9 g/dL (ref 30.0–36.0)
MCV: 89.8 fL (ref 80.0–100.0)
Monocytes Absolute: 0.5 10*3/uL (ref 0.1–1.0)
Monocytes Relative: 7 %
Neutro Abs: 4.8 10*3/uL (ref 1.7–7.7)
Neutrophils Relative %: 67 %
Platelet Count: 270 10*3/uL (ref 150–400)
RBC: 3.91 MIL/uL (ref 3.87–5.11)
RDW: 17.2 % — ABNORMAL HIGH (ref 11.5–15.5)
WBC Count: 7.3 10*3/uL (ref 4.0–10.5)
nRBC: 0 % (ref 0.0–0.2)

## 2023-07-27 LAB — LIPID PANEL
Cholesterol: 157 mg/dL (ref 0–200)
HDL: 64 mg/dL (ref >40)
LDL Cholesterol: 80 mg/dL (ref 0–99)
Total CHOL/HDL Ratio: 2.5 ratio
Triglycerides: 67 mg/dL (ref <150)
VLDL: 13 mg/dL (ref 0–40)

## 2023-07-27 LAB — HEMOGLOBIN A1C
Hgb A1c MFr Bld: 6 % — ABNORMAL HIGH (ref 4.8–5.6)
Mean Plasma Glucose: 125.5 mg/dL

## 2023-07-27 MED ORDER — HEPARIN SOD (PORK) LOCK FLUSH 100 UNIT/ML IV SOLN
500.0000 [IU] | Freq: Once | INTRAVENOUS | Status: AC | PRN
  Administered 2023-07-27: 500 [IU]
  Filled 2023-07-27: qty 5

## 2023-07-27 MED ORDER — VITAMIN B-12 1000 MCG PO TABS: 1000.0000 ug | ORAL_TABLET | Freq: Every day | ORAL | 0 refills | Status: AC

## 2023-07-27 MED ORDER — SODIUM CHLORIDE 0.9 % IV SOLN
200.0000 mg | Freq: Once | INTRAVENOUS | Status: AC
  Administered 2023-07-27: 200 mg via INTRAVENOUS
  Filled 2023-07-27: qty 8

## 2023-07-27 MED ORDER — SODIUM CHLORIDE 0.9 % IV SOLN
Freq: Once | INTRAVENOUS | Status: AC
  Filled 2023-07-27: qty 250

## 2023-07-27 NOTE — Assessment & Plan Note (Signed)
 New small acute infarcts on MRI brain.  She is asymptomatic, neurological exam grossly intact.  Recommend patient to start Aspirin 81mg  daily.  Check A1c and lipid panel. Recommend patient to make a follow up appointment with PCP.- she has appt next week.  Refer to Dr. Barbaraann Cao- she has appt in April 2025.

## 2023-07-27 NOTE — Assessment & Plan Note (Signed)
 Immunotherapy treatment plan as listed above.

## 2023-07-27 NOTE — Progress Notes (Signed)
 Nutrition Follow-up:  Patient with stage IV lung cancer.  S/p TURBT for urothelial carcinoma of bladder.  Patient receiving keytruda  Met with patient during infusion.  Patient reports that appetite is the same.  Still trying to drink 3 ensure shakes a day.  Yesterday ate cheeseburger, macaroni and cheese, green peas.  Had cookies this am for breakfast.      Medications: reviewed  Labs: glucose 120, creatinine 1.13  Anthropometrics:   Weight 113 lb today  112 lb on 3/12 115 lb 6.4 oz on 05/04/23 114 lb 14.4 oz on 9/25  NUTRITION DIAGNOSIS: Inadequate oral intake stable    INTERVENTION:  Continue high calorie shake TID Continue high calorie, high protein foods    MONITORING, EVALUATION, GOAL: weight trends, intake   NEXT VISIT: as needed  Regina Perkins B. Elease Hashimoto, CSO, LDN Registered Dietitian [phone]

## 2023-07-27 NOTE — Progress Notes (Signed)
 Pt here for follow up. Pt reports that she needs a refill on vitamin B12.

## 2023-07-27 NOTE — Assessment & Plan Note (Signed)
 12/21/2022 Non-muscle invasive bladder cancer. S/p TURBT, T1 lesion, high grade.  03/29/2023 Non-muscle invasive bladder cancer. S/p TURBT Patient is s/p intravesical BCG treatments-she is also on suppressive antibiotics Follow-up with urology

## 2023-07-27 NOTE — Assessment & Plan Note (Signed)
 Continue nutrition supplement.

## 2023-07-27 NOTE — Patient Instructions (Signed)
 [facility] - A DEPT OF [facility]  Discharge Instructions: Thank you for choosing [facility] to provide your oncology and hematology care.  If you have a lab appointment with the Cancer Center, please go directly to the Cancer Center and check in at the registration area.  Wear comfortable clothing and clothing appropriate for easy access to any Portacath or PICC line.   We strive to give you quality time with your provider. You may need to reschedule your appointment if you arrive late (15 or more minutes).  Arriving late affects you and other patients whose appointments are after yours.  Also, if you miss three or more appointments without notifying the office, you may be dismissed from the clinic at the provider's discretion.      For prescription refill requests, have your pharmacy contact our office and allow 72 hours for refills to be completed.    Today you received the following chemotherapy and/or immunotherapy agents: pembrolizumab    To help prevent nausea and vomiting after your treatment, we encourage you to take your nausea medication as directed.  BELOW ARE SYMPTOMS THAT SHOULD BE REPORTED IMMEDIATELY: *FEVER GREATER THAN 100.4 F (38 C) OR HIGHER *CHILLS OR SWEATING *NAUSEA AND VOMITING THAT IS NOT CONTROLLED WITH YOUR NAUSEA MEDICATION *UNUSUAL SHORTNESS OF BREATH *UNUSUAL BRUISING OR BLEEDING *URINARY PROBLEMS (pain or burning when urinating, or frequent urination) *BOWEL PROBLEMS (unusual diarrhea, constipation, pain near the anus) TENDERNESS IN MOUTH AND THROAT WITH OR WITHOUT PRESENCE OF ULCERS (sore throat, sores in mouth, or a toothache) UNUSUAL RASH, SWELLING OR PAIN  UNUSUAL VAGINAL DISCHARGE OR ITCHING   Items with * indicate a potential emergency and should be followed up as soon as possible or go to the Emergency Department if any problems should occur.  Please show the CHEMOTHERAPY ALERT CARD or IMMUNOTHERAPY  ALERT CARD at check-in to the Emergency Department and triage nurse.  Should you have questions after your visit or need to cancel or reschedule your appointment, please contact [facility] - A DEPT OF [facility]  [phone] and follow the prompts.  Office hours are 8:00 a.m. to 4:30 p.m. Monday - Friday. Please note that voicemails left after 4:00 p.m. may not be returned until the following business day.  We are closed weekends and major holidays. You have access to a nurse at all times for urgent questions. Please call the main number to the clinic [phone] and follow the prompts.  For any non-urgent questions, you may also contact your provider using MyChart. We now offer e-Visits for anyone 38 and older to request care online for non-urgent symptoms. For details visit [email or web address].   Also download the MyChart app! Go to the app store, search "MyChart", open the app, select [place], and log in with your MyChart username and password.

## 2023-07-27 NOTE — Assessment & Plan Note (Signed)
 Chronic anemia.  Hemoglobin is stable

## 2023-07-27 NOTE — Progress Notes (Signed)
 Hematology/Oncology Progress note [phone] [phone]       REASON OF VISIT Stage IV Lung squamous cell carcinoma  ASSESSMENT & PLAN:   Cancer Staging  Malignant neoplasm of lung (HCC) Staging form: Lung, AJCC 8th Edition - Clinical: Stage IVA (cT4, cN3, cM1a) - Signed by Rickard Patience, MD on 06/28/2022  Urothelial carcinoma of bladder [facility]) Staging form: Urinary Bladder, AJCC 8th Edition - Clinical stage from 12/21/2022: Stage I (cT1, cN0, cM0) - Signed by Rickard Patience, MD on 12/29/2022   Malignant neoplasm of lung (HCC) Non-small cell lung cancer, favor squamous cell carcinoma, stage IV- TPS 90%, TMB 10, KRAS G12V MRI brain with and without contrast -  Left occipital focus of prior hemorrhage with mild linear contrast enhancement. Possible small metastasis or microhemmorhagic event.  TPS 90%, CT in Jan 2025 showed stable disease Labs are reviewed and discussed with patient.  Proceed with Keytruda  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 Non-muscle invasive bladder cancer. S/p TURBT, T1 lesion, high grade.  03/29/2023 Non-muscle invasive bladder cancer. S/p TURBT Patient is s/p intravesical BCG treatments-she is also on suppressive antibiotics Follow-up with urology  CVA (cerebral vascular accident) [facility]) New small acute infarcts on MRI brain.  She is asymptomatic, neurological exam grossly intact.  Recommend patient to start Aspirin 81mg  daily.  Check A1c and lipid panel. Recommend patient to make a follow up appointment with PCP.- she has appt next week.  Refer to Dr. Barbaraann Cao- she has appt in April 2025.     Encounter for antineoplastic immunotherapy Immunotherapy treatment plan as listed above.  History of pulmonary embolism off  Elqiuis 5mg  BID due to GI bleeding  Hypokalemia Continue KCL daily   Normocytic anemia Chronic anemia.  Hemoglobin is stable   Weight loss Continue nutrition supplement     Orders Placed This Encounter   Procedures   CBC with Differential (Cancer Center Only)    Standing Status:   Future    Expected Date:   09/07/2023    Expiration Date:   09/06/2024   CMP (Cancer Center only)    Standing Status:   Future    Expected Date:   09/07/2023    Expiration Date:   09/06/2024   T4    Standing Status:   Future    Expected Date:   09/07/2023    Expiration Date:   09/06/2024   TSH    Standing Status:   Future    Expected Date:   09/07/2023    Expiration Date:   09/06/2024   Follow up in 3 weeks.   All questions were answered. The patient knows to call the clinic with any problems, questions or concerns.  Rickard Patience, MD, PhD [facility] Hematology Oncology 07/27/2023       HISTORY OF PRESENTING ILLNESS:  Regina Perkins 83 y.o. 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 I have reviewed her chart and materials related to her cancer extensively and collaborated history with the patient. Summary of oncologic history is as follows: Oncology History  Malignant neoplasm of lung (HCC)  05/24/2022 Imaging   CT chest angiogram showed 1. No evidence for pulmonary embolism. 2. Left upper lobe/prevascular mass worrisome for neoplasm. 3. Mediastinal and hilar lymphadenopathy. 4. Multiple pulmonary nodules measuring up to 11 mm worrisome for metastatic disease. 5. Small left pleural effusion. 6. Patchy ground-glass and airspace opacities in the left upper lobe worrisome for infection.  7. Moderate-sized pericardial effusion. 8. Cholelithiasis. 9. Nonobstructing left renal calculus   05/25/2022 Imaging   CT abdomen pelvis wo contrast 1. No acute findings or explanation for the patient's symptoms. 2. No evidence of primary malignancy or metastatic disease within the abdomen or pelvis on noncontrast imaging.  3. Cholelithiasis without evidence of cholecystitis or biliary dilatation. 4. Nonobstructing left  renal calculus. 5. Moderate stool throughout the colon suggesting constipation. 6.  Aortic Atherosclerosis    05/26/2022 Initial Diagnosis   Malignant neoplasm of lung - TPS 90%, TMB 10, KRAS G12V  -05/22/2022 in the emergency room, she was found to have hypoxia with oxygen levels of 86% on room air, improved to 94 with 2 L of oxygen.  D-dimer was elevated at 1.29, troponin negative.  Lower extremity Doppler was negative for DVT.  VQ scan high probability PE (Large segmental perfusion defect of the left upper lobe without corresponding radiographic abnormality. Additional bilateral wedge-shaped subsegmental branch perfusion defects .  Patient was admitted and started on heparin Echocardiogram showed no RV strain.  Moderate pericardial effusion. Over her hospitalization, she continues to have shortness of breath with minimal exertion. 05/24/2022, CT chest angiogram showed left lung mass with bilateral lung nodules.   06/16/2022 Imaging   PET scan  1. 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 No evidence of metastatic disease in the abdomen or pelvis. 2. Moderate pericardial effusion. 3. Tiny left pleural effusion. 4. Left renal stone. 5. Aortic atherosclerosis (ICD10-I70.0). Coronary artery calcification. 6. Enlarged pulmonic trunk, indicative of pulmonary arterial hypertension    06/21/2022 Procedure   US guided liver right supraclvicular node biopsy showed Metastatic poorly differentiated carcinoma, favor squamous cell carcinoma, probably pulmonary origin.    06/28/2022 Cancer Staging   Staging form: Lung, AJCC 8th Edition - Clinical: Stage IVA (cT4, cN3, cM1a) - Signed by Rickard Patience, MD on 06/28/2022   07/14/2022 -  Chemotherapy    Patient is on Treatment Plan : LUNG NSCLC Pembrolizumab (200) q21d     09/29/2022 Imaging   CT chest abdomen pelvis w contrast showed Bilateral pulmonary nodules, some of which are waxing/waning, but favoring multifocal lung carcinoma/metastases.   Improving thoracic nodal metastases, as above.  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 Consider cystoscopy as clinically warranted.  Aortic Atherosclerosis (ICD10-I70.0) and Emphysema (ICD10-J43.9)   01/06/2023 Imaging   CT chest abdomen pelvis w contrast showed 1. Interval decrease in size of multiple spiculated and subsolid pulmonary nodules, consistent with treatment response. 2. No significant change in matted post treatment prevascular and left superior mediastinal soft tissue. 3. Diminished size of an endoluminal bladder mass seen on prior examination, soft tissue in this vicinity now measuring no greater than 2.1 x 1.4 cm, previously at least 4.7 x 2.1 cm. Presumably this has been at least partially resected. 4. No evidence of lymphadenopathy or metastatic disease in the abdomen or pelvis. 5. Cholelithiasis. 6. Nonobstructive left nephrolithiasis. 7. Coronary artery disease.   01/10/2023 Procedure   Medi port placed by IR   05/11/2023 Imaging   CT chest abdomen pelvis w contrast showed 1. Unchanged matted, treated prevascular and left superior mediastinal soft tissue. 2. Multiple bilateral spiculated and ground-glass pulmonary nodules not significantly changed. 3. No residual mass of the posterior right aspect of the urinary bladder. Mild residual thickening in this vicinity.  Interval placement of right-sided double-J ureteral stent catheter. 4. No evidence of lymphadenopathy or metastatic disease in the abdomen or pelvis. 5. Emphysema and diffuse bilateral bronchial wall thickening. 6. Coronary artery disease.   Aortic Atherosclerosis (ICD10-I70.0) and Emphysema (ICD10-J43.9).    Urothelial carcinoma of bladder (HCC)   10/06/2022 Initial Diagnosis   Urothelial carcinoma of bladder S/p TURBT  1. 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 Bladder, biopsy, Base of tumor HIGH GRADE PAPILLARY UROTHELIAL CARCINOMA SUBMUCOSA AND MUSCULARIS PROPRIA IS PRESENT AND NOT INVOLVED BY CARCINOMA  Postprocedure, patient developed gross hematuria and was admitted to the hospital.  She received PRBC transfusion.  Hemoglobin improved and she was discharged.    12/21/2022 Cancer Staging   Staging form: Urinary Bladder, AJCC 8th Edition - Clinical stage from 12/21/2022: Stage I (cT1, cN0, cM0) - Signed by Rickard Patience, MD on 12/29/2022 WHO/ISUP grade (low/high): High Grade Histologic grading system: 2 grade system     INTERVAL HISTORY Regina Perkins is a 83 y.o. female who has above history reviewed by me today presents for follow up visit for Lung squamous cell carcinoma She tolerates [place] well. Denies skin rash, diarrhea.  -cystoscopy, non invasive high grade papillary urothelial carcinomas s/p TURBT, s/p  intravesical BCGs suppressive Macrobid 100mg  daily  for UTI prevention  Patient has no new complaints.  Denies chest pain, chest palpitation. She denies headache, focal weakness or slurred speech.   MEDICAL HISTORY:  Past Medical History:  Diagnosis Date   Atherosclerosis of native arteries of extremity with intermittent claudication (HCC)    Bladder mass    Chronic kidney disease, stage 3a (HCC)    COPD (chronic obstructive pulmonary disease) (HCC)    Diabetes mellitus without complication (HCC)    Gastrointestinal hemorrhage    GERD (gastroesophageal reflux disease)    Glaucoma    History of duodenal ulcer    HTN (hypertension)    Hypertension    Hypokalemia    Hypomagnesemia    Leukocytosis    Lung mass     Malignant neoplasm of lung (HCC) 06/28/2022   a.) stage IVA (cT4, cN3, cM1a) favor squamous cell carcinoma --> TPS 90%, TMB 10, KRAS G12V   Malnutrition (HCC)    Normocytic anemia    Pericardial effusion 05/2022   Pulmonary embolism (HCC)    Sepsis (HCC)    Tobacco abuse    Type II diabetes mellitus with renal manifestations (HCC)    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   a.) stage I (cT1, cN0, cM0)    SURGICAL HISTORY: Past Surgical History:  Procedure Laterality Date   ABDOMINAL HYSTERECTOMY     BACK SURGERY     1988 and 1998   CYSTOSCOPY W/ RETROGRADES Bilateral 12/21/2022   Procedure: CYSTOSCOPY WITH RETROGRADE PYELOGRAM;  Surgeon: [name], MD;  Location: [facility] OR[facility];  Service: Urology;  Laterality: Bilateral;   CYSTOSCOPY WITH BIOPSY N/A 03/29/2023   Procedure: CYSTOSCOPY WITH BLADDER BIOPSY;  Surgeon: [name], MD;  Location: [facility];  Service: Urology;  Laterality: N/A;   CYSTOSCOPY WITH STENT PLACEMENT Right 03/29/2023   Procedure: CYSTOSCOPY WITH STENT PLACEMENT;  Surgeon: [name], MD;  Location: [facility];  Service: Urology;  Laterality: Right;   ESOPHAGOGASTRODUODENOSCOPY (EGD) WITH PROPOFOL N/A 07/29/2022   Procedure: ESOPHAGOGASTRODUODENOSCOPY (EGD) WITH PROPOFOL;  Surgeon: Wyline Mood, MD;  Location:  [facility];  Service: Gastroenterology;  Laterality: N/A;   ESOPHAGOGASTRODUODENOSCOPY (EGD) WITH PROPOFOL N/A 01/21/2023   Procedure: ESOPHAGOGASTRODUODENOSCOPY (EGD) WITH PROPOFOL;  Surgeon: Wyline Mood, MD;  Location: [facility];  Service: Gastroenterology;  Laterality: N/A;   HOLMIUM LASER APPLICATION Right 03/29/2023   Procedure: HOLMIUM LASER ABLATION;  Surgeon: [name], MD;  Location: [facility];  Service: Urology;  Laterality: Right;   IR IMAGING GUIDED PORT INSERTION  01/10/2023   TRANSURETHRAL RESECTION OF BLADDER TUMOR N/A 12/21/2022   Procedure: TRANSURETHRAL RESECTION OF BLADDER TUMOR (TURBT);  Surgeon: [name], MD;  Location: [facility];  Service: Urology;  Laterality: N/A;   TRANSURETHRAL RESECTION OF BLADDER TUMOR N/A 03/29/2023   Procedure: TRANSURETHRAL RESECTION OF BLADDER TUMOR (TURBT);  Surgeon: [name], MD;  Location: [facility];  Service: Urology;  Laterality: N/A;   URETEROSCOPY Right 03/29/2023   Procedure: DIAGNOSTIC URETEROSCOPY;  Surgeon: [name], MD;  Location: [facility];  Service: Urology;  Laterality: Right;    SOCIAL HISTORY: Social History   Socioeconomic History   Marital status: Widowed    Spouse name: Not on file   Number of children: Not on file   Years of education: Not on file   Highest education level: Not on file  Occupational History   Not on file  Tobacco Use   Smoking status: Every Day    Current packs/day: 0.25    Types: Cigarettes   Smokeless tobacco: Never  Vaping Use   Vaping status: Never Used  Substance and Sexual Activity   Alcohol use: No   Drug use: No   Sexual activity: Not on file  Other Topics Concern   Not on file  Social History Narrative   Not on file   Social Drivers of Health   Financial Resource Strain: Low Risk  (06/28/2022)   Overall Financial Resource Strain (CARDIA)    Difficulty of Paying Living Expenses: Not very hard  Food Insecurity: No Food Insecurity (12/22/2022)   Hunger Vital Sign    Worried About Running Out of Food in the Last Year: Never true    Ran Out of Food in the Last Year: Never true  Transportation Needs: No Transportation Needs (12/22/2022)   PRAPARE - Administrator, Civil Service (Medical): No    Lack of Transportation (Non-Medical): No  Physical Activity: Not on file  Stress: No Stress Concern Present (06/28/2022)   [employer] of Occupational Health - Occupational Stress Questionnaire    Feeling of Stress : Not at all  Social Connections: Not on file  Intimate Partner Violence: Not At Risk (12/22/2022)   Humiliation, Afraid, Rape, and Kick questionnaire    Fear of Current  or Ex-Partner: No    Emotionally Abused: No    Physically Abused: No    Sexually Abused: No    FAMILY HISTORY: Family History  Problem Relation Age of Onset   Asthma Mother    Heart disease Mother    Hypertension Mother    Diabetes Mother    Stroke Mother    Diabetes Sister    Diabetes Brother     ALLERGIES:  is allergic to ivp dye [iodinated contrast media] and shellfish allergy.  MEDICATIONS:  Current Outpatient Medications  Medication Sig Dispense Refill   albuterol (VENTOLIN HFA) 108 (90 Base) MCG/ACT inhaler Inhale 2 puffs into the lungs every 4 (four) hours as needed for wheezing or shortness of breath.     amLODipine (NORVASC) 10 MG tablet Take  10 mg by mouth every morning.     aspirin EC 81 MG tablet Take 1 tablet (81 mg total) by mouth daily. Swallow whole. 30 tablet 12   lidocaine-prilocaine (EMLA) cream Apply 1 Application topically as needed. Apply to port and cover with saran wrap 1-2 hours prior to port access 30 g 1   lisinopril (ZESTRIL) 40 MG tablet Take 40 mg by mouth every morning.     metFORMIN (GLUCOPHAGE) 1000 MG tablet Take 1,000 mg by mouth daily with breakfast.     potassium chloride (KLOR-CON) 10 MEQ tablet TAKE 1 TABLET BY MOUTH DAILY. BE SURE TO TAKE DOSE ON DAY OF SURGERY 90 tablet 0   cyanocobalamin (VITAMIN B12) 1000 MCG tablet Take 1 tablet (1,000 mcg total) by mouth daily. 90 tablet 0   Current Facility-Administered Medications  Medication Dose Route Frequency Provider Last Rate Last Admin   bcg vaccine injection 81 mg  3.24 mL Bladder Instillation Once         Review of Systems  Constitutional:  Positive for unexpected weight change. Negative for appetite change, chills, fatigue and fever.  HENT:   Negative for hearing loss and voice change.   Eyes:  Negative for eye problems.  Respiratory:  Positive for shortness of breath. Negative for chest tightness and cough.   Cardiovascular:  Negative for chest pain.  Gastrointestinal:  Negative for  abdominal distention, abdominal pain and blood in stool.  Endocrine: Negative for hot flashes.  Genitourinary:  Negative for difficulty urinating and frequency.   Musculoskeletal:  Negative for arthralgias.  Skin:  Negative for itching and rash.  Neurological:  Negative for extremity weakness.  Hematological:  Negative for adenopathy.  Psychiatric/Behavioral:  Negative for confusion.      PHYSICAL EXAMINATION: ECOG PERFORMANCE STATUS: 2 - Symptomatic, <50% confined to bed  Vitals:   07/27/23 0856 07/27/23 0859  BP: (!) 152/80 (!) 148/79  Pulse: 85 80  Temp: 97.8 F (36.6 C)   SpO2: 100%     There were no vitals filed for this visit.    Physical Exam Constitutional:      General: She is not in acute distress.    Appearance: She is not diaphoretic.  HENT:     Head: Normocephalic.  Eyes:     General: No scleral icterus.    Pupils: Pupils are equal, round, and reactive to light.  Cardiovascular:     Rate and Rhythm: Tachycardia present.     Heart sounds: Murmur heard.  Pulmonary:     Effort: Pulmonary effort is normal. No respiratory distress.     Comments: Decreased breath sounds bilaterally Abdominal:     General: There is no distension.  Musculoskeletal:        General: Normal range of motion.     Cervical back: Normal range of motion.  Skin:    Findings: No erythema.  Neurological:     Mental Status: She is alert and oriented to person, place, and time. Mental status is at baseline.     Cranial Nerves: No cranial nerve deficit.     Motor: No abnormal muscle tone.  Psychiatric:        Mood and Affect: Affect normal.      LABORATORY DATA:  I have reviewed the data as listed    Latest Ref Rng & Units 07/27/2023    8:39 AM 07/06/2023    8:38 AM 06/15/2023    8:20 AM  CBC  WBC 4.0 - 10.5 K/uL 7.3  5.6  8.8   Hemoglobin 12.0 - 15.0 g/dL 32.4  40.1  02.7   Hematocrit 36.0 - 46.0 % 35.1  34.1  34.1   Platelets 150 - 400 K/uL 270  301  384       Latest Ref  Rng & Units 07/27/2023    8:39 AM 07/06/2023    8:38 AM 06/15/2023    8:20 AM  CMP  Glucose 70 - 99 mg/dL 253  664  403   BUN 8 - 23 mg/dL 17  18  13    Creatinine 0.44 - 1.00 mg/dL 4.74  2.59  5.63   Sodium 135 - 145 mmol/L 135  136  133   Potassium 3.5 - 5.1 mmol/L 3.6  3.4  3.5   Chloride 98 - 111 mmol/L 102  102  96   CO2 22 - 32 mmol/L 25  26  24    Calcium 8.9 - 10.3 mg/dL 9.2  9.0  9.2   Total Protein 6.5 - 8.1 g/dL 6.8  6.9  6.9   Total Bilirubin 0.0 - 1.2 mg/dL 0.4  0.5  0.4   Alkaline Phos 38 - 126 U/L 56  59  60   AST 15 - 41 U/L 23  22  24    ALT 0 - 44 U/L 14  14  12       RADIOGRAPHIC STUDIES: I have personally reviewed the radiological images as listed and agreed with the findings in the report. No results found.

## 2023-07-27 NOTE — Assessment & Plan Note (Signed)
 Continue KCL daily

## 2023-07-27 NOTE — Assessment & Plan Note (Signed)
 off  Elqiuis 5mg  BID due to GI bleeding

## 2023-07-27 NOTE — Assessment & Plan Note (Signed)
 Non-small cell lung cancer, favor squamous cell carcinoma, stage IV- TPS 90%, TMB 10, KRAS G12V MRI brain with and without contrast -  Left occipital focus of prior hemorrhage with mild linear contrast enhancement. Possible small metastasis or microhemmorhagic event.  TPS 90%, CT in Jan 2025 showed stable disease Labs are reviewed and discussed with patient.  Proceed with Regina Perkins

## 2023-08-17 ENCOUNTER — Inpatient Hospital Stay

## 2023-08-17 ENCOUNTER — Inpatient Hospital Stay (HOSPITAL_BASED_OUTPATIENT_CLINIC_OR_DEPARTMENT_OTHER): Admitting: Oncology

## 2023-08-17 ENCOUNTER — Encounter: Payer: Self-pay | Admitting: Oncology

## 2023-08-17 VITALS — BP 150/78 | HR 70 | Temp 97.4°F | Resp 18 | Wt 112.3 lb

## 2023-08-17 VITALS — BP 166/81 | HR 77 | Temp 96.0°F | Resp 19

## 2023-08-17 DIAGNOSIS — D649 Anemia, unspecified: Secondary | ICD-10-CM

## 2023-08-17 DIAGNOSIS — Z5112 Encounter for antineoplastic immunotherapy: Secondary | ICD-10-CM

## 2023-08-17 DIAGNOSIS — C3482 Malignant neoplasm of overlapping sites of left bronchus and lung: Secondary | ICD-10-CM

## 2023-08-17 DIAGNOSIS — C679 Malignant neoplasm of bladder, unspecified: Secondary | ICD-10-CM | POA: Diagnosis not present

## 2023-08-17 LAB — CMP (CANCER CENTER ONLY)
ALT: 19 U/L (ref 0–44)
AST: 23 U/L (ref 15–41)
Albumin: 3.8 g/dL (ref 3.5–5.0)
Alkaline Phosphatase: 63 U/L (ref 38–126)
Anion gap: 9 (ref 5–15)
BUN: 14 mg/dL (ref 8–23)
CO2: 25 mmol/L (ref 22–32)
Calcium: 9 mg/dL (ref 8.9–10.3)
Chloride: 102 mmol/L (ref 98–111)
Creatinine: 1.02 mg/dL — ABNORMAL HIGH (ref 0.44–1.00)
GFR, Estimated: 55 mL/min — ABNORMAL LOW (ref >60)
Glucose, Bld: 120 mg/dL — ABNORMAL HIGH (ref 70–99)
Potassium: 3.5 mmol/L (ref 3.5–5.1)
Sodium: 136 mmol/L (ref 135–145)
Total Bilirubin: 0.7 mg/dL (ref 0.0–1.2)
Total Protein: 7.4 g/dL (ref 6.5–8.1)

## 2023-08-17 LAB — CBC WITH DIFFERENTIAL (CANCER CENTER ONLY)
Abs Immature Granulocytes: 0.02 10*3/uL (ref 0.00–0.07)
Basophils Absolute: 0 10*3/uL (ref 0.0–0.1)
Basophils Relative: 0 %
Eosinophils Absolute: 0.2 10*3/uL (ref 0.0–0.5)
Eosinophils Relative: 3 %
HCT: 33.8 % — ABNORMAL LOW (ref 36.0–46.0)
Hemoglobin: 10.8 g/dL — ABNORMAL LOW (ref 12.0–15.0)
Immature Granulocytes: 0 %
Lymphocytes Relative: 21 %
Lymphs Abs: 1.5 10*3/uL (ref 0.7–4.0)
MCH: 28.6 pg (ref 26.0–34.0)
MCHC: 32 g/dL (ref 30.0–36.0)
MCV: 89.4 fL (ref 80.0–100.0)
Monocytes Absolute: 0.6 10*3/uL (ref 0.1–1.0)
Monocytes Relative: 8 %
Neutro Abs: 4.9 10*3/uL (ref 1.7–7.7)
Neutrophils Relative %: 68 %
Platelet Count: 242 10*3/uL (ref 150–400)
RBC: 3.78 MIL/uL — ABNORMAL LOW (ref 3.87–5.11)
RDW: 17 % — ABNORMAL HIGH (ref 11.5–15.5)
WBC Count: 7.2 10*3/uL (ref 4.0–10.5)
nRBC: 0 % (ref 0.0–0.2)

## 2023-08-17 MED ORDER — SODIUM CHLORIDE 0.9 % IV SOLN
Freq: Once | INTRAVENOUS | Status: AC
  Filled 2023-08-17: qty 250

## 2023-08-17 MED ORDER — HEPARIN SOD (PORK) LOCK FLUSH 100 UNIT/ML IV SOLN
500.0000 [IU] | Freq: Once | INTRAVENOUS | Status: AC | PRN
Start: 2023-08-17 — End: 2023-08-17
  Administered 2023-08-17: 500 [IU]
  Filled 2023-08-17: qty 5

## 2023-08-17 MED ORDER — SODIUM CHLORIDE 0.9 % IV SOLN
200.0000 mg | Freq: Once | INTRAVENOUS | Status: AC
  Administered 2023-08-17: 200 mg via INTRAVENOUS
  Filled 2023-08-17: qty 8

## 2023-08-17 NOTE — Patient Instructions (Signed)
 [facility] - A DEPT OF [facility]  Discharge Instructions: Thank you for choosing [facility] to provide your oncology and hematology care.  If you have a lab appointment with the Cancer Center, please go directly to the Cancer Center and check in at the registration area.  Wear comfortable clothing and clothing appropriate for easy access to any Portacath or PICC line.   We strive to give you quality time with your provider. You may need to reschedule your appointment if you arrive late (15 or more minutes).  Arriving late affects you and other patients whose appointments are after yours.  Also, if you miss three or more appointments without notifying the office, you may be dismissed from the clinic at the provider's discretion.      For prescription refill requests, have your pharmacy contact our office and allow 72 hours for refills to be completed.    Today you received the following chemotherapy and/or immunotherapy agents [name]      To help prevent nausea and vomiting after your treatment, we encourage you to take your nausea medication as directed.  BELOW ARE SYMPTOMS THAT SHOULD BE REPORTED IMMEDIATELY: *FEVER GREATER THAN 100.4 F (38 C) OR HIGHER *CHILLS OR SWEATING *NAUSEA AND VOMITING THAT IS NOT CONTROLLED WITH YOUR NAUSEA MEDICATION *UNUSUAL SHORTNESS OF BREATH *UNUSUAL BRUISING OR BLEEDING *URINARY PROBLEMS (pain or burning when urinating, or frequent urination) *BOWEL PROBLEMS (unusual diarrhea, constipation, pain near the anus) TENDERNESS IN MOUTH AND THROAT WITH OR WITHOUT PRESENCE OF ULCERS (sore throat, sores in mouth, or a toothache) UNUSUAL RASH, SWELLING OR PAIN  UNUSUAL VAGINAL DISCHARGE OR ITCHING   Items with * indicate a potential emergency and should be followed up as soon as possible or go to the Emergency Department if any problems should occur.  Please show the CHEMOTHERAPY ALERT CARD or IMMUNOTHERAPY  ALERT CARD at check-in to the Emergency Department and triage nurse.  Should you have questions after your visit or need to cancel or reschedule your appointment, please contact [facility] - A DEPT OF [facility]  [phone] and follow the prompts.  Office hours are 8:00 a.m. to 4:30 p.m. Monday - Friday. Please note that voicemails left after 4:00 p.m. may not be returned until the following business day.  We are closed weekends and major holidays. You have access to a nurse at all times for urgent questions. Please call the main number to the clinic [phone] and follow the prompts.  For any non-urgent questions, you may also contact your provider using MyChart. We now offer e-Visits for anyone 48 and older to request care online for non-urgent symptoms. For details visit [email or web address].   Also download the MyChart app! Go to the app store, search "MyChart", open the app, select [place], and log in with your MyChart username and password.

## 2023-08-17 NOTE — Assessment & Plan Note (Signed)
 Immunotherapy treatment plan as listed above.

## 2023-08-17 NOTE — Assessment & Plan Note (Signed)
 12/21/2022 Non-muscle invasive bladder cancer. S/p TURBT, T1 lesion, high grade.  03/29/2023 Non-muscle invasive bladder cancer. S/p TURBT Patient is s/p intravesical BCG treatments-she is also on suppressive antibiotics Follow-up with urology

## 2023-08-17 NOTE — Progress Notes (Signed)
 Hematology/Oncology Progress note [phone] [phone]       REASON OF VISIT Stage IV Lung squamous cell carcinoma  ASSESSMENT & PLAN:   Cancer Staging  Malignant neoplasm of lung (HCC) Staging form: Lung, AJCC 8th Edition - Clinical: Stage IVA (cT4, cN3, cM1a) - Signed by Timmy Forbes, MD on 06/28/2022  Urothelial carcinoma of bladder [facility]) Staging form: Urinary Bladder, AJCC 8th Edition - Clinical stage from 12/21/2022: Stage I (cT1, cN0, cM0) - Signed by Timmy Forbes, MD on 12/29/2022   Malignant neoplasm of lung (HCC) Non-small cell lung cancer, favor squamous cell carcinoma, stage IV- TPS 90%, TMB 10, KRAS G12V MRI brain with and without contrast -  Left occipital focus of prior hemorrhage with mild linear contrast enhancement. Possible small metastasis or microhemmorhagic event.  TPS 90%, CT in Jan 2025 showed stable disease Labs are reviewed and discussed with patient.  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 S/p TURBT, T1 lesion, high grade.  03/29/2023 Non-muscle invasive bladder cancer. S/p TURBT Patient is s/p intravesical BCG treatments-she is also on suppressive antibiotics Follow-up with urology  Encounter for antineoplastic immunotherapy Immunotherapy treatment plan as listed above.  Normocytic anemia Chronic anemia.  Hemoglobin is stable      Orders Placed This Encounter  Procedures   CBC with Differential (Cancer Center Only)    Standing Status:   Future    Expected Date:   09/28/2023    Expiration Date:   09/27/2024   CMP (Cancer Center only)    Standing Status:   Future    Expected Date:   09/28/2023    Expiration Date:   09/27/2024   Follow up in 3 weeks.   All questions were answered. The patient knows to call the clinic with any problems, questions or concerns.  Timmy Forbes, MD, PhD [facility] Hematology Oncology 08/17/2023       HISTORY OF PRESENTING ILLNESS:   Regina Perkins 83 y.o. 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 I have reviewed her chart and materials related to her cancer extensively and collaborated history with the patient. Summary of oncologic history is as follows: Oncology History  Malignant neoplasm of lung (HCC)  05/24/2022 Imaging   CT chest angiogram showed 1. No evidence for pulmonary embolism. 2. Left upper lobe/prevascular mass worrisome for neoplasm. 3. Mediastinal and hilar lymphadenopathy. 4. Multiple pulmonary nodules measuring up to 11 mm worrisome for metastatic disease. 5. Small left pleural effusion. 6. Patchy ground-glass and airspace opacities in the left upper lobe worrisome for infection. 7. Moderate-sized pericardial effusion. 8. Cholelithiasis. 9. Nonobstructing left renal calculus   05/25/2022 Imaging   CT abdomen pelvis wo contrast 1. No acute findings or explanation for the patient's symptoms. 2. No evidence of primary malignancy or metastatic disease within the abdomen or pelvis on noncontrast imaging.  3. Cholelithiasis without evidence of cholecystitis or biliary dilatation. 4. Nonobstructing left renal calculus. 5. Moderate stool throughout the colon suggesting constipation. 6.  Aortic Atherosclerosis    05/26/2022 Initial Diagnosis   Malignant neoplasm of lung - TPS 90%, TMB 10, KRAS G12V  -05/22/2022 in the emergency room, she was found to have hypoxia with oxygen levels of 86% on room air, improved to 94 with 2 L of oxygen.  D-dimer was elevated at 1.29, troponin negative.  Lower extremity Doppler was negative for DVT.  VQ scan high probability PE (  Large segmental perfusion defect of the left upper lobe without corresponding radiographic abnormality. Additional bilateral wedge-shaped subsegmental branch perfusion defects .  Patient was admitted and started on heparin  Echocardiogram showed no RV  strain.  Moderate pericardial effusion. Over her hospitalization, she continues to have shortness of breath with minimal exertion. 05/24/2022, CT chest angiogram showed left lung mass with bilateral lung nodules.   06/16/2022 Imaging   PET scan  1. 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 No evidence of metastatic disease in the abdomen or pelvis. 2. Moderate pericardial effusion. 3. Tiny left pleural effusion. 4. Left renal stone. 5. Aortic atherosclerosis (ICD10-I70.0). Coronary artery calcification. 6. 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    06/28/2022 Cancer Staging   Staging form: Lung, AJCC 8th Edition - Clinical: Stage IVA (cT4, cN3, cM1a) - Signed by Timmy Forbes, MD on 06/28/2022   07/14/2022 -  Chemotherapy   Patient is on Treatment Plan : LUNG NSCLC Pembrolizumab  (200) q21d     09/29/2022 Imaging   CT chest abdomen pelvis w contrast showed Bilateral pulmonary nodules, some of which are waxing/waning, but favoring multifocal lung carcinoma/metastases.   Improving thoracic nodal metastases, as above.  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 Consider cystoscopy as clinically warranted.  Aortic Atherosclerosis (ICD10-I70.0) and Emphysema (ICD10-J43.9)   01/06/2023 Imaging   CT chest abdomen pelvis w contrast showed 1. Interval decrease in size of multiple spiculated and subsolid pulmonary nodules, consistent with treatment response. 2. No significant change in matted post treatment prevascular and left  superior mediastinal soft tissue. 3. Diminished size of an endoluminal bladder mass seen on prior examination, soft tissue in this vicinity now measuring no greater than 2.1 x 1.4 cm, previously at least 4.7 x 2.1 cm. Presumably this has been at least partially resected. 4. No evidence of lymphadenopathy or metastatic disease in the abdomen or pelvis. 5. Cholelithiasis. 6. Nonobstructive left nephrolithiasis. 7. Coronary artery disease.   01/10/2023 Procedure   Medi port placed by IR   05/11/2023 Imaging   CT chest abdomen pelvis w contrast showed 1. Unchanged matted, treated prevascular and left superior mediastinal soft tissue. 2. Multiple bilateral spiculated and ground-glass pulmonary nodules not significantly changed. 3. No residual mass of the posterior right aspect of the urinary bladder. Mild residual thickening in this vicinity. Interval placement of right-sided double-J ureteral stent catheter. 4. No evidence of lymphadenopathy or metastatic disease in the abdomen or pelvis. 5. Emphysema and diffuse bilateral bronchial wall thickening. 6. Coronary artery disease.   Aortic Atherosclerosis (ICD10-I70.0) and Emphysema (ICD10-J43.9).    Urothelial carcinoma of bladder (HCC)  10/06/2022 Initial Diagnosis   Urothelial carcinoma of bladder S/p TURBT  1. 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 Bladder, biopsy, Base of tumor HIGH GRADE PAPILLARY UROTHELIAL CARCINOMA SUBMUCOSA AND MUSCULARIS PROPRIA IS PRESENT AND NOT INVOLVED BY CARCINOMA  Postprocedure, patient developed gross hematuria and was admitted to the hospital.  She received PRBC transfusion.  Hemoglobin improved and she was discharged.    12/21/2022 Cancer  Staging   Staging form: Urinary Bladder, AJCC 8th  Edition - Clinical stage from 12/21/2022: Stage I (cT1, cN0, cM0) - Signed by Timmy Forbes, MD on 12/29/2022 WHO/ISUP grade (low/high): High Grade Histologic grading system: 2 grade system     INTERVAL HISTORY Rainie L Mckendry is a 83 y.o. female who has above history reviewed by me today presents for follow up visit for Lung squamous cell carcinoma She tolerates [place] well. Denies skin rash, diarrhea.  -cystoscopy, non invasive high grade papillary urothelial carcinomas s/p TURBT, s/p  intravesical BCGs suppressive Macrobid  100mg  daily  for UTI prevention  Patient has no new complaints.  Denies chest pain, chest palpitation. She denies headache, focal weakness or slurred speech.  Weight is stable.   MEDICAL HISTORY:  Past Medical History:  Diagnosis Date   Atherosclerosis of native arteries of extremity with intermittent claudication (HCC)    Bladder mass    Chronic kidney disease, stage 3a (HCC)    COPD (chronic obstructive pulmonary disease) (HCC)    Diabetes mellitus without complication (HCC)    Gastrointestinal hemorrhage    GERD (gastroesophageal reflux disease)    Glaucoma    History of duodenal ulcer    HTN (hypertension)    Hypertension    Hypokalemia    Hypomagnesemia    Leukocytosis    Lung mass    Malignant neoplasm of lung (HCC) 06/28/2022   a.) stage IVA (cT4, cN3, cM1a) favor squamous cell carcinoma --> TPS 90%, TMB 10, KRAS G12V   Malnutrition (HCC)    Normocytic anemia    Pericardial effusion 05/2022   Pulmonary embolism (HCC)    Sepsis (HCC)    Tobacco abuse    Type II diabetes mellitus with renal manifestations (HCC)    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   a.) stage I (cT1, cN0, cM0)    SURGICAL HISTORY: Past Surgical History:  Procedure Laterality Date   ABDOMINAL HYSTERECTOMY     BACK SURGERY     1988 and 1998   CYSTOSCOPY W/ RETROGRADES Bilateral 12/21/2022   Procedure: CYSTOSCOPY WITH RETROGRADE PYELOGRAM;  Surgeon: Geraline Knapp,  MD;  Location: [facility] ORS;  Service: Urology;  Laterality: Bilateral;   CYSTOSCOPY WITH BIOPSY N/A 03/29/2023   Procedure: CYSTOSCOPY WITH BLADDER BIOPSY;  Surgeon: Geraline Knapp, MD;  Location: [facility];  Service: Urology;  Laterality: N/A;   CYSTOSCOPY WITH STENT PLACEMENT Right 03/29/2023   Procedure: CYSTOSCOPY WITH STENT PLACEMENT;  Surgeon: Geraline Knapp, MD;  Location: [facility];  Service: Urology;  Laterality: Right;   ESOPHAGOGASTRODUODENOSCOPY (EGD) WITH PROPOFOL  N/A 07/29/2022   Procedure: ESOPHAGOGASTRODUODENOSCOPY (EGD) WITH PROPOFOL ;  Surgeon: Luke Salaam, MD;  Location[facility];  Service: Gastroenterology;  Laterality: N/A;   ESOPHAGOGASTRODUODENOSCOPY (EGD) WITH PROPOFOL  N/A 01/21/2023   Procedure: ESOPHAGOGASTRODUODENOSCOPY (EGD) WITH PROPOFOL ;  Surgeon: Luke Salaam, MD;  Location: [facility] ENDOSCOPY;  Service: Gastroenterology;  Laterality: N/A;   HOLMIUM LASER APPLICATION Right 03/29/2023   Procedure: HOLMIUM LASER ABLATION;  Surgeon: Geraline Knapp, MD;  Location: [facility];  Service: Urology;  Laterality: Right;   IR IMAGING GUIDED PORT INSERTION  01/10/2023   TRANSURETHRAL RESECTION OF BLADDER TUMOR N/A 12/21/2022   Procedure: TRANSURETHRAL RESECTION OF BLADDER TUMOR (TURBT);  Surgeon: Geraline Knapp, MD;  Location: [facility];  Service: Urology;  Laterality: N/A;   TRANSURETHRAL RESECTION OF BLADDER TUMOR N/A 03/29/2023   Procedure: TRANSURETHRAL RESECTION OF BLADDER TUMOR (TURBT);  Surgeon: Geraline Knapp, MD;  Location: [facility];  Service: Urology;  Laterality: N/A;   URETEROSCOPY Right 03/29/2023   Procedure: DIAGNOSTIC URETEROSCOPY;  Surgeon: Geraline Knapp, MD;  Location: [facility];  Service: Urology;  Laterality: Right;    SOCIAL HISTORY: Social History   Socioeconomic History   Marital status: Widowed    Spouse name: Not on file   Number of children: Not on file   Years of education: Not on file   Highest education level: Not on file  Occupational History    Not on file  Tobacco Use   Smoking status: Every Day    Current packs/day: 0.25    Types: Cigarettes   Smokeless tobacco: Never  Vaping Use   Vaping status: Never Used  Substance and Sexual Activity   Alcohol use: No   Drug use: No   Sexual activity: Not on file  Other Topics Concern   Not on file  Social History Narrative   Not on file   Social Drivers of Health   Financial Resource Strain: Low Risk  (06/28/2022)   Overall Financial Resource Strain (CARDIA)    Difficulty of Paying Living Expenses: Not very hard  Food Insecurity: No Food Insecurity (12/22/2022)   Hunger Vital Sign    Worried About Running Out of Food in the Last Year: Never true    Ran Out of Food in the Last Year: Never true  Transportation Needs: No Transportation Needs (12/22/2022)   PRAPARE - Administrator, Civil Service (Medical): No    Lack of Transportation (Non-Medical): No  Physical Activity: Not on file  Stress: No Stress Concern Present (06/28/2022)   [employer] of Occupational Health - Occupational Stress Questionnaire    Feeling of Stress : Not at all  Social Connections: Not on file  Intimate Partner Violence: Not At Risk (12/22/2022)   Humiliation, Afraid, Rape, and Kick questionnaire    Fear of Current or Ex-Partner: No    Emotionally Abused: No    Physically Abused: No    Sexually Abused: No    FAMILY HISTORY: Family History  Problem Relation Age of Onset   Asthma Mother    Heart disease Mother    Hypertension Mother    Diabetes Mother    Stroke Mother    Diabetes Sister    Diabetes Brother     ALLERGIES:  is allergic to ivp dye [iodinated contrast media] and shellfish allergy.  MEDICATIONS:  Current Outpatient Medications  Medication Sig Dispense Refill   albuterol  (VENTOLIN  HFA) 108 (90 Base) MCG/ACT inhaler Inhale 2 puffs into the lungs every 4 (four) hours as needed for wheezing or shortness of breath.     amLODipine  (NORVASC ) 10 MG tablet Take 10 mg by  mouth every morning.     aspirin  EC 81 MG tablet Take 1 tablet (81 mg total) by mouth daily. Swallow whole. 30 tablet 12   cyanocobalamin  (VITAMIN B12) 1000 MCG tablet Take 1 tablet (1,000 mcg total) by mouth daily. 90 tablet 0   lidocaine -prilocaine  (EMLA ) cream Apply 1 Application topically as needed. Apply to port and cover with saran wrap 1-2 hours prior to port access 30 g 1   lisinopril  (ZESTRIL ) 40 MG tablet Take 40 mg by mouth every morning.     metFORMIN  (GLUCOPHAGE ) 1000 MG tablet Take 1,000 mg by mouth daily with breakfast.     potassium chloride  (KLOR-CON ) 10 MEQ tablet TAKE 1 TABLET BY MOUTH DAILY. BE SURE TO TAKE DOSE ON DAY OF SURGERY 90 tablet 0   Current Facility-Administered Medications  Medication Dose Route Frequency Provider Last Rate Last Admin   bcg vaccine injection 81 mg  3.24 mL Bladder Instillation Once        Facility-Administered Medications Ordered in Other Visits  Medication Dose Route Frequency Provider Last Rate Last Admin   pembrolizumab  (KEYTRUDA ) 200 mg in sodium chloride  0.9 % 50 mL chemo infusion  200 mg Intravenous Once Timmy Forbes, MD 116 mL/hr at 08/17/23 1048 200 mg at 08/17/23 1048    Review of Systems  Constitutional:  Negative for appetite change, chills, fatigue and fever.  HENT:   Negative for hearing loss and voice change.   Eyes:  Negative for eye problems.  Respiratory:  Negative for chest tightness, cough and shortness of breath.   Cardiovascular:  Negative for chest pain.  Gastrointestinal:  Negative for abdominal distention, abdominal pain and blood in stool.  Endocrine: Negative for hot flashes.  Genitourinary:  Negative for difficulty urinating and frequency.   Musculoskeletal:  Negative for arthralgias.  Skin:  Negative for itching and rash.  Neurological:  Negative for extremity weakness.  Hematological:  Negative for adenopathy.  Psychiatric/Behavioral:  Negative for confusion.      PHYSICAL EXAMINATION: ECOG PERFORMANCE  STATUS: 2 - Symptomatic, <50% confined to bed  Vitals:   08/17/23 0910 08/17/23 0920  BP: (!) 154/84 (!) 150/78  Pulse: 70   Resp: 18   Temp: (!) 97.4 F (36.3 C)   SpO2: 100%     Filed Weights   08/17/23 0910  Weight: 112 lb 4.8 oz (50.9 kg)      Physical Exam Constitutional:      General: She is not in acute distress.    Appearance: She is not diaphoretic.  HENT:     Head: Normocephalic.  Eyes:     General: No scleral icterus.    Pupils: Pupils are equal, round, and reactive to light.  Cardiovascular:     Rate and Rhythm: Tachycardia present.     Heart sounds: Murmur heard.  Pulmonary:     Effort: Pulmonary effort is normal. No respiratory distress.     Comments: Decreased breath sounds bilaterally Abdominal:     General: There is no distension.  Musculoskeletal:        General: Normal range of motion.     Cervical back: Normal range of motion.  Skin:    Findings: No erythema.  Neurological:     Mental Status: She is alert and oriented to person, place, and time. Mental status is at baseline.     Cranial Nerves: No cranial nerve deficit.     Motor: No abnormal muscle tone.  Psychiatric:        Mood and Affect: Affect normal.      LABORATORY DATA:  I have reviewed the data as listed    Latest Ref Rng & Units 08/17/2023    8:53 AM 07/27/2023    8:39 AM 07/06/2023    8:38 AM  CBC  WBC 4.0 - 10.5 K/uL 7.2  7.3  5.6   Hemoglobin 12.0 - 15.0 g/dL 65.7  84.6  96.2   Hematocrit 36.0 - 46.0 % 33.8  35.1  34.1   Platelets 150 - 400 K/uL 242  270  301       Latest Ref Rng & Units 08/17/2023    8:53 AM 07/27/2023    8:39 AM 07/06/2023    8:38 AM  CMP  Glucose 70 - 99 mg/dL 952  841  324   BUN 8 -  23 mg/dL 14  17  18    Creatinine 0.44 - 1.00 mg/dL 9.62  9.52  8.41   Sodium 135 - 145 mmol/L 136  135  136   Potassium 3.5 - 5.1 mmol/L 3.5  3.6  3.4   Chloride 98 - 111 mmol/L 102  102  102   CO2 22 - 32 mmol/L 25  25  26    Calcium  8.9 - 10.3 mg/dL 9.0  9.2  9.0    Total Protein 6.5 - 8.1 g/dL 7.4  6.8  6.9   Total Bilirubin 0.0 - 1.2 mg/dL 0.7  0.4  0.5   Alkaline Phos 38 - 126 U/L 63  56  59   AST 15 - 41 U/L 23  23  22    ALT 0 - 44 U/L 19  14  14       RADIOGRAPHIC STUDIES: I have personally reviewed the radiological images as listed and agreed with the findings in the report. No results found.

## 2023-08-17 NOTE — Assessment & Plan Note (Signed)
 Chronic anemia.  Hemoglobin is stable

## 2023-08-17 NOTE — Assessment & Plan Note (Signed)
 Non-small cell lung cancer, favor squamous cell carcinoma, stage IV- TPS 90%, TMB 10, KRAS G12V MRI brain with and without contrast -  Left occipital focus of prior hemorrhage with mild linear contrast enhancement. Possible small metastasis or microhemmorhagic event.  TPS 90%, CT in Jan 2025 showed stable disease Labs are reviewed and discussed with patient.  Proceed with [name]

## 2023-08-19 ENCOUNTER — Inpatient Hospital Stay (HOSPITAL_BASED_OUTPATIENT_CLINIC_OR_DEPARTMENT_OTHER): Admitting: Internal Medicine

## 2023-08-19 ENCOUNTER — Encounter: Payer: Self-pay | Admitting: Internal Medicine

## 2023-08-19 VITALS — BP 146/88 | HR 100 | Temp 97.9°F | Resp 20 | Wt 111.3 lb

## 2023-08-19 DIAGNOSIS — C3482 Malignant neoplasm of overlapping sites of left bronchus and lung: Secondary | ICD-10-CM

## 2023-08-19 DIAGNOSIS — I639 Cerebral infarction, unspecified: Secondary | ICD-10-CM | POA: Diagnosis not present

## 2023-08-19 DIAGNOSIS — R9089 Other abnormal findings on diagnostic imaging of central nervous system: Secondary | ICD-10-CM

## 2023-08-19 DIAGNOSIS — Z5112 Encounter for antineoplastic immunotherapy: Secondary | ICD-10-CM | POA: Diagnosis not present

## 2023-08-19 NOTE — Progress Notes (Signed)
 [facility] at [facility] [address]  [place], [place] [address] [phone]   New Patient Evaluation  Date of Service: 08/19/23 Patient Name: Regina Perkins Patient MRN: [id number] Patient DOB: 1940/12/25 Provider: Mamie Searles, MD  Identifying Statement:  Regina Perkins is a 83 y.o. 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    Referring Provider: [facility], [facility] [address] [place],  [place] [address]  Primary Cancer:  Oncologic History: Oncology History  Malignant neoplasm of lung (HCC)  05/24/2022 Imaging   CT chest angiogram showed 1. No evidence for pulmonary embolism. 2. Left upper lobe/prevascular mass worrisome for neoplasm. 3. Mediastinal and hilar lymphadenopathy. 4. Multiple pulmonary nodules measuring up to 11 mm worrisome for metastatic disease. 5. Small left pleural effusion. 6. Patchy ground-glass and airspace opacities in the left upper lobe worrisome for infection. 7. Moderate-sized pericardial effusion. 8. Cholelithiasis. 9. Nonobstructing left renal calculus   05/25/2022 Imaging   CT abdomen pelvis wo contrast 1. No acute findings or explanation for the patient's symptoms. 2. No evidence of primary malignancy or metastatic disease within the abdomen or pelvis on noncontrast imaging.  3. Cholelithiasis without evidence of cholecystitis or biliary dilatation. 4. Nonobstructing left renal calculus. 5. Moderate stool throughout the colon suggesting constipation. 6.  Aortic Atherosclerosis    05/26/2022 Initial Diagnosis   Malignant neoplasm of lung - TPS  90%, TMB 10, KRAS G12V  -05/22/2022 in the emergency room, she was found to have hypoxia with oxygen levels of 86% on room air, improved to 94 with 2 L of oxygen.  D-dimer was elevated at 1.29, troponin negative.  Lower extremity Doppler was negative for DVT.  VQ scan high probability PE (Large segmental perfusion defect of the left upper lobe without corresponding radiographic abnormality. Additional bilateral wedge-shaped subsegmental branch perfusion defects .  Patient was admitted and started on heparin  Echocardiogram showed no RV strain.  Moderate pericardial effusion. Over her hospitalization, she continues to have shortness of breath with minimal exertion. 05/24/2022, CT chest angiogram showed left lung mass with bilateral lung nodules.   06/16/2022 Imaging   PET scan  1. 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 No evidence of metastatic disease in the abdomen or pelvis. 2. Moderate pericardial effusion. 3. Tiny left pleural effusion. 4. Left renal stone. 5. Aortic atherosclerosis (ICD10-I70.0). Coronary artery calcification. 6. 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    06/28/2022 Cancer Staging   Staging form: Lung, AJCC 8th Edition - Clinical: Stage IVA (cT4, cN3, cM1a) - Signed by Timmy Forbes, MD on 06/28/2022   07/14/2022 -  Chemotherapy   Patient is on Treatment Plan : LUNG NSCLC Pembrolizumab  (200) q21d     09/29/2022 Imaging   CT chest abdomen pelvis w contrast showed Bilateral pulmonary  nodules, some of  which are waxing/waning, but favoring multifocal lung carcinoma/metastases.   Improving thoracic nodal metastases, as above.  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 Consider cystoscopy as clinically warranted.  Aortic Atherosclerosis (ICD10-I70.0) and Emphysema (ICD10-J43.9)   01/06/2023 Imaging   CT chest abdomen pelvis w contrast showed 1. Interval decrease in size of multiple spiculated and subsolid pulmonary nodules, consistent with treatment response. 2. No significant change in matted post treatment prevascular and left superior mediastinal soft tissue. 3. Diminished size of an endoluminal bladder mass seen on prior examination, soft tissue in this vicinity now measuring no greater than 2.1 x 1.4 cm, previously at least 4.7 x 2.1 cm. Presumably this has been at least partially resected. 4. No evidence of lymphadenopathy or metastatic disease in the abdomen or pelvis. 5. Cholelithiasis. 6. Nonobstructive left nephrolithiasis. 7. Coronary artery disease.   01/10/2023 Procedure   Medi port placed by IR   05/11/2023 Imaging   CT chest abdomen pelvis w contrast showed 1. Unchanged matted, treated prevascular and left superior mediastinal soft tissue. 2. Multiple bilateral spiculated and ground-glass pulmonary nodules not significantly changed. 3. No residual mass of the posterior right aspect of the urinary bladder. Mild residual thickening in this vicinity. Interval placement of right-sided double-J ureteral stent catheter. 4. No evidence of lymphadenopathy or metastatic disease in the abdomen or pelvis. 5. Emphysema and diffuse bilateral bronchial wall thickening. 6. Coronary artery disease.   Aortic Atherosclerosis (ICD10-I70.0) and Emphysema (ICD10-J43.9).    Urothelial carcinoma of bladder (HCC)  10/06/2022 Initial Diagnosis   Urothelial carcinoma of bladder S/p TURBT  1. 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 Bladder, biopsy, Base of tumor HIGH GRADE PAPILLARY UROTHELIAL CARCINOMA SUBMUCOSA AND MUSCULARIS PROPRIA IS PRESENT AND NOT INVOLVED BY CARCINOMA  Postprocedure, patient developed gross hematuria and was admitted to the hospital.  She received PRBC transfusion.  Hemoglobin improved and she was discharged.    12/21/2022 Cancer Staging   Staging form: Urinary Bladder, AJCC 8th Edition - Clinical stage from 12/21/2022: Stage I (cT1, cN0, cM0) - Signed by Timmy Forbes, MD on 12/29/2022 WHO/ISUP grade (low/high): High Grade Histologic grading system: 2 grade system     History of Present Illness: The patient's records from the referring physician were obtained and reviewed and the patient interviewed to confirm this HPI.  Regina Perkins presents today to evaluate recent findings on MRI brain.  She denies any neurologic symptoms.  No slurred speech, hand weakness, or gait difficulties aside from orthopedic limitations.  She is taking aspirin  81mg  daily since March.  Continues on Keytruda  with Dr. Wilhelmenia Harada.  Medications: Current Outpatient Medications on File Prior to Visit  Medication Sig Dispense Refill   albuterol  (VENTOLIN  HFA) 108 (90 Base) MCG/ACT inhaler Inhale 2 puffs into the lungs every 4 (four) hours as needed for wheezing or shortness of breath.     amLODipine  (NORVASC ) 10 MG tablet Take 10 mg by mouth every morning.     aspirin  EC 81 MG tablet Take 1 tablet (81 mg total) by mouth daily. Swallow whole. 30 tablet 12   cyanocobalamin  (VITAMIN B12) 1000 MCG tablet Take 1 tablet (1,000 mcg total) by mouth daily. 90 tablet 0   lidocaine -prilocaine  (EMLA ) cream Apply 1 Application topically as needed. Apply to port and cover with saran wrap 1-2 hours prior to port access 30 g 1  lisinopril  (ZESTRIL ) 40 MG tablet Take 40 mg by mouth every morning.      metFORMIN  (GLUCOPHAGE ) 1000 MG tablet Take 1,000 mg by mouth daily with breakfast.     potassium chloride  (KLOR-CON ) 10 MEQ tablet TAKE 1 TABLET BY MOUTH DAILY. BE SURE TO TAKE DOSE ON DAY OF SURGERY 90 tablet 0   Current Facility-Administered Medications on File Prior to Visit  Medication Dose Route Frequency Provider Last Rate Last Admin   bcg vaccine injection 81 mg  3.24 mL Bladder Instillation Once         Allergies:  Allergies  Allergen Reactions   Ivp Dye [Iodinated Contrast Media]    Shellfish Allergy Other (See Comments)    Reaction: unknown    Past Medical History:  Past Medical History:  Diagnosis Date   Atherosclerosis of native arteries of extremity with intermittent claudication (HCC)    Bladder mass    Chronic kidney disease, stage 3a (HCC)    COPD (chronic obstructive pulmonary disease) (HCC)    Diabetes mellitus without complication (HCC)    Gastrointestinal hemorrhage    GERD (gastroesophageal reflux disease)    Glaucoma    History of duodenal ulcer    HTN (hypertension)    Hypertension    Hypokalemia    Hypomagnesemia    Leukocytosis    Lung mass    Malignant neoplasm of lung (HCC) 06/28/2022   a.) stage IVA (cT4, cN3, cM1a) favor squamous cell carcinoma --> TPS 90%, TMB 10, KRAS G12V   Malnutrition (HCC)    Normocytic anemia    Pericardial effusion 05/2022   Pulmonary embolism (HCC)    Sepsis (HCC)    Tobacco abuse    Type II diabetes mellitus with renal manifestations (HCC)    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   a.) stage I (cT1, cN0, cM0)   Past Surgical History:  Past Surgical History:  Procedure Laterality Date   ABDOMINAL HYSTERECTOMY     BACK SURGERY     1988 and 1998   CYSTOSCOPY W/ RETROGRADES Bilateral 12/21/2022   Procedure: CYSTOSCOPY WITH RETROGRADE PYELOGRAM;  Surgeon: Geraline Knapp, MD;  Location: [facility];  Service: Urology;  Laterality: Bilateral;   CYSTOSCOPY WITH BIOPSY N/A 03/29/2023   Procedure: CYSTOSCOPY  WITH BLADDER BIOPSY;  Surgeon: Geraline Knapp, MD;  Location: [facility];  Service: Urology;  Laterality: N/A;   CYSTOSCOPY WITH STENT PLACEMENT Right 03/29/2023   Procedure: CYSTOSCOPY WITH STENT PLACEMENT;  Surgeon: Geraline Knapp, MD;  Location: [facility];  Service: Urology;  Laterality: Right;   ESOPHAGOGASTRODUODENOSCOPY (EGD) WITH PROPOFOL  N/A 07/29/2022   Procedure: ESOPHAGOGASTRODUODENOSCOPY (EGD) WITH PROPOFOL ;  Surgeon: Luke Salaam, MD;  Location: [facility];  Service: Gastroenterology;  Laterality: N/A;   ESOPHAGOGASTRODUODENOSCOPY (EGD) WITH PROPOFOL  N/A 01/21/2023   Procedure: ESOPHAGOGASTRODUODENOSCOPY (EGD) WITH PROPOFOL ;  Surgeon: Luke Salaam, MD;  Location: [facility] END[facility]COPY;  Service: Gastroenterology;  Laterality: N/A;   HOLMIUM LASER APPLICATION Right 03/29/2023   Procedure: HOLMIUM LASER ABLATION;  Surgeon: Geraline Knapp, MD;  Location: [facility];  Service: Urology;  Laterality: Right;   IR IMAGING GUIDED PORT INSERTION  01/10/2023   TRANSURETHRAL RESECTION OF BLADDER TUMOR N/A 12/21/2022   Procedure: TRANSURETHRAL RESECTION OF BLADDER TUMOR (TURBT);  Surgeon: Geraline Knapp, MD;  Location: [facility];  Service: Urology;  Laterality: N/A;   TRANSURETHRAL RESECTION OF BLADDER TUMOR N/A 03/29/2023   Procedure: TRANSURETHRAL RESECTION OF BLADDER TUMOR (TURBT);  Surgeon: Geraline Knapp, MD;  Location: [facility];  Service:  Urology;  Laterality: N/A;   URETEROSCOPY Right 03/29/2023   Procedure: DIAGNOSTIC URETEROSCOPY;  Surgeon: Geraline Knapp, MD;  Location: [facility];  Service: Urology;  Laterality: Right;   Social History:  Social History   Socioeconomic History   Marital status: Widowed    Spouse name: Not on file   Number of children: Not on file   Years of education: Not on file   Highest education level: Not on file  Occupational History   Not on file  Tobacco Use   Smoking status: Every Day    Current packs/day: 0.25    Types: Cigarettes   Smokeless tobacco: Never   Vaping Use   Vaping status: Never Used  Substance and Sexual Activity   Alcohol use: No   Drug use: No   Sexual activity: Not on file  Other Topics Concern   Not on file  Social History Narrative   Not on file   Social Drivers of Health   Financial Resource Strain: Low Risk  (06/28/2022)   Overall Financial Resource Strain (CARDIA)    Difficulty of Paying Living Expenses: Not very hard  Food Insecurity: No Food Insecurity (12/22/2022)   Hunger Vital Sign    Worried About Running Out of Food in the Last Year: Never true    Ran Out of Food in the Last Year: Never true  Transportation Needs: No Transportation Needs (12/22/2022)   PRAPARE - Administrator, Civil Service (Medical): No    Lack of Transportation (Non-Medical): No  Physical Activity: Not on file  Stress: No Stress Concern Present (06/28/2022)   [employer] of Occupational Health - Occupational Stress Questionnaire    Feeling of Stress : Not at all  Social Connections: Not on file  Intimate Partner Violence: Not At Risk (12/22/2022)   Humiliation, Afraid, Rape, and Kick questionnaire    Fear of Current or Ex-Partner: No    Emotionally Abused: No    Physically Abused: No    Sexually Abused: No   Family History:  Family History  Problem Relation Age of Onset   Asthma Mother    Heart disease Mother    Hypertension Mother    Diabetes Mother    Stroke Mother    Diabetes Sister    Diabetes Brother     Review of Systems: Constitutional: Doesn't report fevers, chills or abnormal weight loss Eyes: Doesn't report blurriness of vision Ears, nose, mouth, throat, and face: Doesn't report sore throat Respiratory: Doesn't report cough, dyspnea or wheezes Cardiovascular: Doesn't report palpitation, chest discomfort  Gastrointestinal:  Doesn't report nausea, constipation, diarrhea GU: Doesn't report incontinence Skin: Doesn't report skin rashes Neurological: Per HPI Musculoskeletal: Doesn't report joint  pain Behavioral/Psych: Doesn't report anxiety  Physical Exam: Vitals:   08/19/23 0907  BP: (!) 146/88  Pulse: 100  Resp: 20  Temp: 97.9 F (36.6 C)  SpO2: 100%   KPS: 80. General: Alert, cooperative, pleasant, in no acute distress Head: Normal EENT: No conjunctival injection or scleral icterus.  Lungs: Resp effort normal Cardiac: Regular rate Abdomen: Non-distended abdomen Skin: No rashes cyanosis or petechiae. Extremities: No clubbing or edema  Neurologic Exam: Mental Status: Awake, alert, attentive to examiner. Oriented to self and environment. Language is fluent with intact comprehension.  Cranial Nerves: Visual acuity is grossly normal. Visual fields are full. Extra-ocular movements intact. No ptosis. Face is symmetric Motor: Tone and bulk are normal. Power is full in both arms and legs. Reflexes are symmetric, no pathologic reflexes present.  Sensory: Intact to light touch Gait: Normal.   Labs: I have reviewed the data as listed    Component Value Date/Time   NA 136 08/17/2023 0853   NA 134 (L) 01/16/2012 1722   K 3.5 08/17/2023 0853   K 3.1 (L) 01/16/2012 1722   CL 102 08/17/2023 0853   CL 97 (L) 01/16/2012 1722   CO2 25 08/17/2023 0853   CO2 27 01/16/2012 1722   GLUCOSE 120 (H) 08/17/2023 0853   GLUCOSE 198 (H) 01/16/2012 1722   BUN 14 08/17/2023 0853   BUN 13 01/16/2012 1722   CREATININE 1.02 (H) 08/17/2023 0853   CREATININE 0.99 01/16/2012 1722   CALCIUM  9.0 08/17/2023 0853   CALCIUM  9.2 01/16/2012 1722   PROT 7.4 08/17/2023 0853   PROT 8.1 01/16/2012 1722   ALBUMIN  3.8 08/17/2023 0853   ALBUMIN  2.9 (L) 01/16/2012 1722   AST 23 08/17/2023 0853   ALT 19 08/17/2023 0853   ALT 22 01/16/2012 1722   ALKPHOS 63 08/17/2023 0853   ALKPHOS 87 01/16/2012 1722   BILITOT 0.7 08/17/2023 0853   GFRNONAA 55 (L) 08/17/2023 0853   GFRNONAA 57 (L) 01/16/2012 1722   GFRAA 47 (L) 01/01/2016 0457   GFRAA >60 01/16/2012 1722   Lab Results  Component Value Date    WBC 7.2 08/17/2023   NEUTROABS 4.9 08/17/2023   HGB 10.8 (L) 08/17/2023   HCT 33.8 (L) 08/17/2023   MCV 89.4 08/17/2023   PLT 242 08/17/2023    Imaging: EXAM: MRI HEAD WITHOUT AND WITH CONTRAST   TECHNIQUE: Multiplanar, multiecho pulse sequences of the brain and surrounding structures were obtained without and with intravenous contrast.   CONTRAST:  5mL GADAVIST  GADOBUTROL  1 MMOL/ML IV SOLN   COMPARISON:  Brain MRI 09/16/2022.   FINDINGS: Brain:   Generalized cerebral and cerebellar atrophy.   4 mm acute infarct within the right lentiform nucleus (series 6, image 25).   2 mm acute infarct within the medial right temporal lobe (series 6, image 19).   As compared to the prior brain MRI of 08/27/2022, unchanged appearance of a small focus of hemosiderin deposition in the left occipital lobe. No convincing enhancement is present at this site. No associated mass effect or surrounding edema.   No pathologic intracranial enhancement elsewhere.   Unchanged chronic infarcts within the corpus callosum, left corona radiata, within/about the bilateral deep gray nuclei and within the right middle cerebellar peduncle.   Background moderate-to-advanced patchy and confluent T2 FLAIR hyperintense signal abnormality within the cerebral white matter, nonspecific but compatible with chronic small vessel ischemic disease.   New punctate chronic microhemorrhage within the right thalamus.   No extra-axial fluid collection.   No midline shift.   Vascular: Maintained flow voids within the proximal large arterial vessels.   Skull and upper cervical spine: No focal worrisome marrow lesion. Incompletely assessed cervical spondylosis.   Sinuses/Orbits: No mass or acute finding within the imaged orbits. Prior right ocular lens replacement. No significant paranasal sinus disease.   Other: Fluid within the bilateral mastoid air cells (small-volume right, trace left).   Impressions  #3 and #4 will be called to the ordering clinician or representative by the Radiologist Assistant, and communication documented in the PACS or [employer].   IMPRESSION: 1. No evidence of active intracranial metastatic disease. 2. Unchanged small nonspecific focus of chronic hemorrhage within the left occipital lobe. No associated pathologic enhancement, edema or mass effect at this site. 3. 4 mm acute infarct within the right  lentiform nucleus. 4. 2 mm acute infarct within the medial right temporal lobe. 5. Background parenchymal atrophy and fairly advanced chronic small vessel ischemic disease with chronic infarcts, as described. 6. Punctate chronic microhemorrhage within the right thalamus, new from the prior MRI. 7. Fluid within the bilateral mastoid air cells (small-volume right, trace left).     Electronically Signed   By: Bascom Lily D.O.   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  Etiology is suspected small vessel disease based on pattern of distribution.  Fortunately these have not been symptomatic.  There is also a remote chance these DWI foci could represent metastases from lung cancer.  TTE from last year was reviewed.  Lipid panel from earlier this month was reviewed.  No vascular imaging is available.  Recommended the following: -Con't ASA 81mg  daily -Repeat MRI brain to r/o mets -MRA head and neck for CNS vasculature eval -Defer additional echocardiogram -Con't HTN management -Counseled on smoking cessation today  She is agreeable with this plan.   We spent twenty additional minutes teaching regarding  the natural history, biology, and historical experience in the treatment of neurologic complications of cancer.   We appreciate the opportunity to participate in the care of Regina Perkins.   We ask that Regina Perkins return to clinic in 2 months following next brain MRI, or sooner as needed.  All questions were answered. The patient knows to call the clinic with any problems, questions or concerns. No barriers to learning were detected.  The total time spent in the encounter was 40 minutes and more than 50% was on counseling and review of test results   Mamie Searles, MD Medical Director of Neuro-Oncology [facility] at [place] 08/19/23 9:36 AM

## 2023-09-07 ENCOUNTER — Inpatient Hospital Stay (HOSPITAL_BASED_OUTPATIENT_CLINIC_OR_DEPARTMENT_OTHER): Admitting: Oncology

## 2023-09-07 ENCOUNTER — Inpatient Hospital Stay

## 2023-09-07 ENCOUNTER — Encounter: Payer: Self-pay | Admitting: Oncology

## 2023-09-07 ENCOUNTER — Inpatient Hospital Stay: Attending: Oncology

## 2023-09-07 VITALS — BP 144/65 | HR 89

## 2023-09-07 VITALS — BP 154/70 | HR 63 | Temp 98.0°F | Resp 18 | Wt 113.7 lb

## 2023-09-07 DIAGNOSIS — C3482 Malignant neoplasm of overlapping sites of left bronchus and lung: Secondary | ICD-10-CM | POA: Diagnosis not present

## 2023-09-07 DIAGNOSIS — D649 Anemia, unspecified: Secondary | ICD-10-CM | POA: Insufficient documentation

## 2023-09-07 DIAGNOSIS — C3412 Malignant neoplasm of upper lobe, left bronchus or lung: Secondary | ICD-10-CM | POA: Diagnosis present

## 2023-09-07 DIAGNOSIS — C787 Secondary malignant neoplasm of liver and intrahepatic bile duct: Secondary | ICD-10-CM | POA: Diagnosis present

## 2023-09-07 DIAGNOSIS — I639 Cerebral infarction, unspecified: Secondary | ICD-10-CM | POA: Diagnosis not present

## 2023-09-07 DIAGNOSIS — Z79899 Other long term (current) drug therapy: Secondary | ICD-10-CM | POA: Diagnosis not present

## 2023-09-07 DIAGNOSIS — Z8551 Personal history of malignant neoplasm of bladder: Secondary | ICD-10-CM | POA: Diagnosis not present

## 2023-09-07 DIAGNOSIS — Z5112 Encounter for antineoplastic immunotherapy: Secondary | ICD-10-CM | POA: Diagnosis not present

## 2023-09-07 DIAGNOSIS — C771 Secondary and unspecified malignant neoplasm of intrathoracic lymph nodes: Secondary | ICD-10-CM | POA: Insufficient documentation

## 2023-09-07 DIAGNOSIS — E876 Hypokalemia: Secondary | ICD-10-CM | POA: Insufficient documentation

## 2023-09-07 DIAGNOSIS — Z7982 Long term (current) use of aspirin: Secondary | ICD-10-CM | POA: Insufficient documentation

## 2023-09-07 DIAGNOSIS — C679 Malignant neoplasm of bladder, unspecified: Secondary | ICD-10-CM

## 2023-09-07 LAB — CMP (CANCER CENTER ONLY)
ALT: 13 U/L (ref 0–44)
AST: 20 U/L (ref 15–41)
Albumin: 3.7 g/dL (ref 3.5–5.0)
Alkaline Phosphatase: 60 U/L (ref 38–126)
Anion gap: 10 (ref 5–15)
BUN: 16 mg/dL (ref 8–23)
CO2: 25 mmol/L (ref 22–32)
Calcium: 9.1 mg/dL (ref 8.9–10.3)
Chloride: 102 mmol/L (ref 98–111)
Creatinine: 1.12 mg/dL — ABNORMAL HIGH (ref 0.44–1.00)
GFR, Estimated: 49 mL/min — ABNORMAL LOW (ref >60)
Glucose, Bld: 95 mg/dL (ref 70–99)
Potassium: 3.5 mmol/L (ref 3.5–5.1)
Sodium: 137 mmol/L (ref 135–145)
Total Bilirubin: 0.3 mg/dL (ref 0.0–1.2)
Total Protein: 7.2 g/dL (ref 6.5–8.1)

## 2023-09-07 LAB — CBC WITH DIFFERENTIAL (CANCER CENTER ONLY)
Abs Immature Granulocytes: 0.03 10*3/uL (ref 0.00–0.07)
Basophils Absolute: 0 10*3/uL (ref 0.0–0.1)
Basophils Relative: 0 %
Eosinophils Absolute: 0.3 10*3/uL (ref 0.0–0.5)
Eosinophils Relative: 4 %
HCT: 34 % — ABNORMAL LOW (ref 36.0–46.0)
Hemoglobin: 10.8 g/dL — ABNORMAL LOW (ref 12.0–15.0)
Immature Granulocytes: 0 %
Lymphocytes Relative: 19 %
Lymphs Abs: 1.3 10*3/uL (ref 0.7–4.0)
MCH: 27.8 pg (ref 26.0–34.0)
MCHC: 31.8 g/dL (ref 30.0–36.0)
MCV: 87.6 fL (ref 80.0–100.0)
Monocytes Absolute: 0.5 10*3/uL (ref 0.1–1.0)
Monocytes Relative: 7 %
Neutro Abs: 4.8 10*3/uL (ref 1.7–7.7)
Neutrophils Relative %: 70 %
Platelet Count: 308 10*3/uL (ref 150–400)
RBC: 3.88 MIL/uL (ref 3.87–5.11)
RDW: 16.9 % — ABNORMAL HIGH (ref 11.5–15.5)
WBC Count: 7 10*3/uL (ref 4.0–10.5)
nRBC: 0 % (ref 0.0–0.2)

## 2023-09-07 LAB — TSH: TSH: 2.632 u[IU]/mL (ref 0.350–4.500)

## 2023-09-07 MED ORDER — HEPARIN SOD (PORK) LOCK FLUSH 100 UNIT/ML IV SOLN
500.0000 [IU] | Freq: Once | INTRAVENOUS | Status: AC | PRN
  Administered 2023-09-07: 500 [IU]
  Filled 2023-09-07: qty 5

## 2023-09-07 MED ORDER — SODIUM CHLORIDE 0.9 % IV SOLN
Freq: Once | INTRAVENOUS | Status: AC
  Filled 2023-09-07: qty 250

## 2023-09-07 MED ORDER — PEMBROLIZUMAB CHEMO INJECTION 100 MG/4ML
200.0000 mg | Freq: Once | INTRAVENOUS | Status: AC
  Administered 2023-09-07: 200 mg via INTRAVENOUS
  Filled 2023-09-07: qty 8

## 2023-09-07 NOTE — Assessment & Plan Note (Signed)
 12/21/2022 Non-muscle invasive bladder cancer. S/p TURBT, T1 lesion, high grade.  03/29/2023 Non-muscle invasive bladder cancer. S/p TURBT Patient is s/p intravesical BCG treatments-she is also on suppressive antibiotics Follow-up with urology

## 2023-09-07 NOTE — Assessment & Plan Note (Signed)
 Chronic anemia.  Hemoglobin is stable

## 2023-09-07 NOTE — Patient Instructions (Signed)
 [facility] - A DEPT OF [facility]  Discharge Instructions: Thank you for choosing [facility] to provide your oncology and hematology care.  If you have a lab appointment with the Cancer Center, please go directly to the Cancer Center and check in at the registration area.  Wear comfortable clothing and clothing appropriate for easy access to any Portacath or PICC line.   We strive to give you quality time with your provider. You may need to reschedule your appointment if you arrive late (15 or more minutes).  Arriving late affects you and other patients whose appointments are after yours.  Also, if you miss three or more appointments without notifying the office, you may be dismissed from the clinic at the provider's discretion.      For prescription refill requests, have your pharmacy contact our office and allow 72 hours for refills to be completed.    Today you received the following chemotherapy and/or immunotherapy agents Keytruda      To help prevent nausea and vomiting after your treatment, we encourage you to take your nausea medication as directed.  BELOW ARE SYMPTOMS THAT SHOULD BE REPORTED IMMEDIATELY: *FEVER GREATER THAN 100.4 F (38 C) OR HIGHER *CHILLS OR SWEATING *NAUSEA AND VOMITING THAT IS NOT CONTROLLED WITH YOUR NAUSEA MEDICATION *UNUSUAL SHORTNESS OF BREATH *UNUSUAL BRUISING OR BLEEDING *URINARY PROBLEMS (pain or burning when urinating, or frequent urination) *BOWEL PROBLEMS (unusual diarrhea, constipation, pain near the anus) TENDERNESS IN MOUTH AND THROAT WITH OR WITHOUT PRESENCE OF ULCERS (sore throat, sores in mouth, or a toothache) UNUSUAL RASH, SWELLING OR PAIN  UNUSUAL VAGINAL DISCHARGE OR ITCHING   Items with * indicate a potential emergency and should be followed up as soon as possible or go to the Emergency Department if any problems should occur.  Please show the CHEMOTHERAPY ALERT CARD or IMMUNOTHERAPY  ALERT CARD at check-in to the Emergency Department and triage nurse.  Should you have questions after your visit or need to cancel or reschedule your appointment, please contact [facility] - A DEPT OF [facility]  [phone] and follow the prompts.  Office hours are 8:00 a.m. to 4:30 p.m. Monday - Friday. Please note that voicemails left after 4:00 p.m. may not be returned until the following business day.  We are closed weekends and major holidays. You have access to a nurse at all times for urgent questions. Please call the main number to the clinic [phone] and follow the prompts.  For any non-urgent questions, you may also contact your provider using MyChart. We now offer e-Visits for anyone 58 and older to request care online for non-urgent symptoms. For details visit [email or web address].   Also download the MyChart app! Go to the app store, search "MyChart", open the app, select [place], and log in with your MyChart username and password.

## 2023-09-07 NOTE — Assessment & Plan Note (Addendum)
 small acute infarcts on MRI brain.  She is asymptomatic, Follow up with neurology Dr. Mark Sil

## 2023-09-07 NOTE — Assessment & Plan Note (Signed)
 Non-small cell lung cancer, favor squamous cell carcinoma, stage IV- TPS 90%, TMB 10, KRAS G12V MRI brain with and without contrast -  Left occipital focus of prior hemorrhage with mild linear contrast enhancement. Possible small metastasis or microhemmorhagic event.  TPS 90%, CT in Jan 2025 showed stable disease Labs are reviewed and discussed with patient.  Proceed with Regina Perkins

## 2023-09-07 NOTE — Progress Notes (Signed)
 Hematology/Oncology Progress note [phone] [phone]       REASON OF VISIT Stage IV Lung squamous cell carcinoma  ASSESSMENT & PLAN:   Cancer Staging  Malignant neoplasm of lung (HCC) Staging form: Lung, AJCC 8th Edition - Clinical: Stage IVA (cT4, cN3, cM1a) - Signed by Timmy Forbes, MD on 06/28/2022  Urothelial carcinoma of bladder [facility]) Staging form: Urinary Bladder, AJCC 8th Edition - Clinical stage from 12/21/2022: Stage I (cT1, cN0, cM0) - Signed by Timmy Forbes, MD on 12/29/2022   Encounter for antineoplastic immunotherapy Immunotherapy treatment plan as listed above.  Normocytic anemia Chronic anemia.  Hemoglobin is stable   Malignant neoplasm of lung (HCC) Non-small cell lung cancer, favor squamous cell carcinoma, stage IV- TPS 90%, TMB 10, KRAS G12V MRI brain with and without contrast -  Left occipital focus of prior hemorrhage with mild linear contrast enhancement. Possible small metastasis or microhemmorhagic event.  TPS 90%, CT in Jan 2025 showed stable disease Labs are reviewed and discussed with patient.  Proceed with Keytruda   CVA (cerebral vascular accident) (HCC) small acute infarcts on MRI brain.  She is asymptomatic, Follow up with neurology Dr. 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 S/p TURBT, T1 lesion, high grade.  03/29/2023 Non-muscle invasive bladder cancer. S/p TURBT Patient is s/p intravesical BCG treatments-she is also on suppressive antibiotics Follow-up with urology      Orders Placed This Encounter  Procedures   CT CHEST ABDOMEN PELVIS WO CONTRAST    Standing Status:   Future    Expected Date:   09/21/2023    Expiration Date:   09/06/2024    Preferred imaging location?:   [facility]    If indicated for the ordered procedure, I authorize the administration of oral contrast media per Radiology protocol:   Yes    Does  the patient have a contrast media/X-ray dye allergy?:   Yes   Follow up in 3 weeks.   All questions were answered. The patient knows to call the clinic with any problems, questions or concerns.  Timmy Forbes, MD, PhD [facility] Hematology Oncology 09/07/2023       HISTORY OF PRESENTING ILLNESS:  Regina Perkins 83 y.o. 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 I have reviewed her chart and materials related to her cancer extensively and collaborated history with the patient. Summary of oncologic history is as follows: Oncology History  Malignant neoplasm of lung (HCC)  05/24/2022 Imaging   CT chest angiogram showed 1. No evidence for pulmonary embolism. 2. Left upper lobe/prevascular mass worrisome for neoplasm. 3. Mediastinal and hilar lymphadenopathy. 4. Multiple pulmonary nodules measuring up to 11 mm worrisome for metastatic disease. 5. Small left pleural effusion. 6. Patchy ground-glass and airspace opacities in the left upper lobe worrisome for infection. 7. Moderate-sized pericardial effusion. 8. Cholelithiasis. 9. Nonobstructing left renal calculus   05/25/2022 Imaging   CT abdomen pelvis wo contrast 1. No acute findings or explanation for the patient's symptoms. 2. No evidence of primary malignancy or metastatic disease within the abdomen or pelvis on noncontrast imaging.  3. Cholelithiasis without evidence of cholecystitis or biliary dilatation. 4. Nonobstructing left renal calculus. 5. Moderate stool throughout the colon suggesting constipation. 6.  Aortic Atherosclerosis    05/26/2022 Initial Diagnosis   Malignant neoplasm of lung - TPS 90%, TMB 10, KRAS G12V  -  05/22/2022 in the emergency room, she was found to have hypoxia with oxygen levels of 86% on room air, improved to 94 with 2 L of oxygen.  D-dimer was elevated at 1.29, troponin negative.  Lower extremity Doppler  was negative for DVT.  VQ scan high probability PE (Large segmental perfusion defect of the left upper lobe without corresponding radiographic abnormality. Additional bilateral wedge-shaped subsegmental branch perfusion defects .  Patient was admitted and started on heparin  Echocardiogram showed no RV strain.  Moderate pericardial effusion. Over her hospitalization, she continues to have shortness of breath with minimal exertion. 05/24/2022, CT chest angiogram showed left lung mass with bilateral lung nodules.   06/16/2022 Imaging   PET scan  1. 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 No evidence of metastatic disease in the abdomen or pelvis. 2. Moderate pericardial effusion. 3. Tiny left pleural effusion. 4. Left renal stone. 5. Aortic atherosclerosis (ICD10-I70.0). Coronary artery calcification. 6. 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    06/28/2022 Cancer Staging   Staging form: Lung, AJCC 8th Edition - Clinical: Stage IVA (cT4, cN3, cM1a) - Signed by Timmy Forbes, MD on 06/28/2022   07/14/2022 -  Chemotherapy   Patient is on Treatment Plan : LUNG NSCLC Pembrolizumab  (200) q21d     09/29/2022 Imaging   CT chest abdomen pelvis w contrast showed Bilateral pulmonary nodules, some of which are waxing/waning, but favoring multifocal lung carcinoma/metastases.   Improving thoracic nodal metastases, as above.  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 Consider cystoscopy as clinically warranted.  Aortic Atherosclerosis  (ICD10-I70.0) and Emphysema (ICD10-J43.9)   01/06/2023 Imaging   CT chest abdomen pelvis w contrast showed 1. Interval decrease in size of multiple spiculated and subsolid pulmonary nodules, consistent with treatment response. 2. No significant change in matted post treatment prevascular and left superior mediastinal soft tissue. 3. Diminished size of an endoluminal bladder mass seen on prior examination, soft tissue in this vicinity now measuring no greater than 2.1 x 1.4 cm, previously at least 4.7 x 2.1 cm. Presumably this has been at least partially resected. 4. No evidence of lymphadenopathy or metastatic disease in the abdomen or pelvis. 5. Cholelithiasis. 6. Nonobstructive left nephrolithiasis. 7. Coronary artery disease.   01/10/2023 Procedure   Medi port placed by IR   05/11/2023 Imaging   CT chest abdomen pelvis w contrast showed 1. Unchanged matted, treated prevascular and left superior mediastinal soft tissue. 2. Multiple bilateral spiculated and ground-glass pulmonary nodules not significantly changed. 3. No residual mass of the posterior right aspect of the urinary bladder. Mild residual thickening in this vicinity. Interval placement of right-sided double-J ureteral stent catheter. 4. No evidence of lymphadenopathy or metastatic disease in the abdomen or pelvis. 5. Emphysema and diffuse bilateral bronchial wall thickening. 6. Coronary artery disease.   Aortic Atherosclerosis (ICD10-I70.0) and Emphysema (ICD10-J43.9).    Urothelial carcinoma of bladder (HCC)  10/06/2022 Initial Diagnosis   Urothelial carcinoma of bladder S/p TURBT  1. 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 Bladder,  biopsy, Base of tumor HIGH GRADE PAPILLARY UROTHELIAL CARCINOMA SUBMUCOSA  AND MUSCULARIS PROPRIA IS PRESENT AND NOT INVOLVED BY CARCINOMA  Postprocedure, patient developed gross hematuria and was admitted to the hospital.  She received PRBC transfusion.  Hemoglobin improved and she was discharged.    12/21/2022 Cancer Staging   Staging form: Urinary Bladder, AJCC 8th Edition - Clinical stage from 12/21/2022: Stage I (cT1, cN0, cM0) - Signed by Timmy Forbes, MD on 12/29/2022 WHO/ISUP grade (low/high): High Grade Histologic grading system: 2 grade system     INTERVAL HISTORY Regina Perkins is a 83 y.o. female who has above history reviewed by me today presents for follow up visit for Lung squamous cell carcinoma She tolerates [place] well. Denies skin rash, diarrhea.  suppressive Macrobid  100mg  daily  for UTI prevention  Patient has no new complaints.  Denies chest pain, chest palpitation. She denies headache, focal weakness or slurred speech.  Weight is stable.   MEDICAL HISTORY:  Past Medical History:  Diagnosis Date   Atherosclerosis of native arteries of extremity with intermittent claudication (HCC)    Bladder mass    Chronic kidney disease, stage 3a (HCC)    COPD (chronic obstructive pulmonary disease) (HCC)    Diabetes mellitus without complication (HCC)    Gastrointestinal hemorrhage    GERD (gastroesophageal reflux disease)    Glaucoma    History of duodenal ulcer    HTN (hypertension)    Hypertension    Hypokalemia    Hypomagnesemia    Leukocytosis    Lung mass    Malignant neoplasm of lung (HCC) 06/28/2022   a.) stage IVA (cT4, cN3, cM1a) favor squamous cell carcinoma --> TPS 90%, TMB 10, KRAS G12V   Malnutrition (HCC)    Normocytic anemia    Pericardial effusion 05/2022   Pulmonary embolism (HCC)    Sepsis (HCC)    Tobacco abuse    Type II diabetes mellitus with renal manifestations (HCC)    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   a.) stage I (cT1, cN0, cM0)    SURGICAL HISTORY: Past Surgical History:  Procedure Laterality  Date   ABDOMINAL HYSTERECTOMY     BACK SURGERY     1988 and 1998   CYSTOSCOPY W/ RETROGRADES Bilateral 12/21/2022   Procedure: CYSTOSCOPY WITH RETROGRADE PYELOGRAM;  Surgeon: Geraline Knapp, MD;  Location: [facility];  Service: Urology;  Laterality: Bilateral;   CYSTOSCOPY WITH BIOPSY N/A 03/29/2023   Procedure: CYSTOSCOPY WITH BLADDER BIOPSY;  Surgeon: Geraline Knapp, MD;  Location: [facility];  Service: Urology;  Laterality: N/A;   CYSTOSCOPY WITH STENT PLACEMENT Right 03/29/2023   Procedure: CYSTOSCOPY WITH STENT PLACEMENT;  Surgeon: Geraline Knapp, MD;  Location: [facility];  Service: Urology;  Laterality: Right;   ESOPHAGOGASTRODUODENOSCOPY (EGD) WITH PROPOFOL  N/A 07/29/2022   Procedure: ESOPHAGOGASTRODUODENOSCOPY (EGD) WITH PROPOFOL ;  Surgeon: Luke Salaam, MD;  Location[facility];  Service: Gastroenterology;  Laterality: N/A;   ESOPHAGOGASTRODUODENOSCOPY (EGD) WITH PROPOFOL  N/A 01/21/2023   Procedure: ESOPHAGOGASTRODUODENOSCOPY (EGD) WITH PROPOFOL ;  Surgeon: Luke Salaam, MD;  Location: [facility] END[facility]COPY;  Service: Gastroenterology;  Laterality: N/A;   HOLMIUM LASER APPLICATION Right 03/29/2023   Procedure: HOLMIUM LASER ABLATION;  Surgeon: Geraline Knapp, MD;  Location: [facility];  Service: Urology;  Laterality: Right;   IR IMAGING GUIDED PORT INSERTION  01/10/2023   TRANSURETHRAL RESECTION OF BLADDER TUMOR N/A 12/21/2022   Procedure: TRANSURETHRAL RESECTION OF BLADDER TUMOR (TURBT);  Surgeon: Geraline Knapp, MD;  Location: [facility];  Service:  Urology;  Laterality: N/A;   TRANSURETHRAL RESECTION OF BLADDER TUMOR N/A 03/29/2023   Procedure: TRANSURETHRAL RESECTION OF BLADDER TUMOR (TURBT);  Surgeon: Geraline Knapp, MD;  Location: [facility];  Service: Urology;  Laterality: N/A;   URETEROSCOPY Right 03/29/2023   Procedure: DIAGNOSTIC URETEROSCOPY;  Surgeon: Geraline Knapp, MD;  Location: [facility];  Service: Urology;  Laterality: Right;    SOCIAL HISTORY: Social History   Socioeconomic  History   Marital status: Widowed    Spouse name: Not on file   Number of children: Not on file   Years of education: Not on file   Highest education level: Not on file  Occupational History   Not on file  Tobacco Use   Smoking status: Every Day    Current packs/day: 0.25    Types: Cigarettes   Smokeless tobacco: Never  Vaping Use   Vaping status: Never Used  Substance and Sexual Activity   Alcohol use: No   Drug use: No   Sexual activity: Not on file  Other Topics Concern   Not on file  Social History Narrative   Not on file   Social Drivers of Health   Financial Resource Strain: Low Risk  (06/28/2022)   Overall Financial Resource Strain (CARDIA)    Difficulty of Paying Living Expenses: Not very hard  Food Insecurity: No Food Insecurity (12/22/2022)   Hunger Vital Sign    Worried About Running Out of Food in the Last Year: Never true    Ran Out of Food in the Last Year: Never true  Transportation Needs: No Transportation Needs (12/22/2022)   PRAPARE - Administrator, Civil Service (Medical): No    Lack of Transportation (Non-Medical): No  Physical Activity: Not on file  Stress: No Stress Concern Present (06/28/2022)   [employer] of Occupational Health - Occupational Stress Questionnaire    Feeling of Stress : Not at all  Social Connections: Not on file  Intimate Partner Violence: Not At Risk (12/22/2022)   Humiliation, Afraid, Rape, and Kick questionnaire    Fear of Current or Ex-Partner: No    Emotionally Abused: No    Physically Abused: No    Sexually Abused: No    FAMILY HISTORY: Family History  Problem Relation Age of Onset   Asthma Mother    Heart disease Mother    Hypertension Mother    Diabetes Mother    Stroke Mother    Diabetes Sister    Diabetes Brother     ALLERGIES:  is allergic to ivp dye [iodinated contrast media] and shellfish allergy.  MEDICATIONS:  Current Outpatient Medications  Medication Sig Dispense Refill   albuterol   (VENTOLIN  HFA) 108 (90 Base) MCG/ACT inhaler Inhale 2 puffs into the lungs every 4 (four) hours as needed for wheezing or shortness of breath.     amLODipine  (NORVASC ) 10 MG tablet Take 10 mg by mouth every morning.     aspirin  EC 81 MG tablet Take 1 tablet (81 mg total) by mouth daily. Swallow whole. 30 tablet 12   cyanocobalamin  (VITAMIN B12) 1000 MCG tablet Take 1 tablet (1,000 mcg total) by mouth daily. 90 tablet 0   lidocaine -prilocaine  (EMLA ) cream Apply 1 Application topically as needed. Apply to port and cover with saran wrap 1-2 hours prior to port access 30 g 1   lisinopril  (ZESTRIL ) 40 MG tablet Take 40 mg by mouth every morning.     metFORMIN  (GLUCOPHAGE ) 1000 MG tablet Take 1,000 mg by mouth daily  with breakfast.     potassium chloride  (KLOR-CON ) 10 MEQ tablet TAKE 1 TABLET BY MOUTH DAILY. BE SURE TO TAKE DOSE ON DAY OF SURGERY 90 tablet 0   Current Facility-Administered Medications  Medication Dose Route Frequency Provider Last Rate Last Admin   bcg vaccine injection 81 mg  3.24 mL Bladder Instillation Once         Review of Systems  Constitutional:  Negative for appetite change, chills, fatigue and fever.  HENT:   Negative for hearing loss and voice change.   Eyes:  Negative for eye problems.  Respiratory:  Negative for chest tightness, cough and shortness of breath.   Cardiovascular:  Negative for chest pain.  Gastrointestinal:  Negative for abdominal distention, abdominal pain and blood in stool.  Endocrine: Negative for hot flashes.  Genitourinary:  Negative for difficulty urinating and frequency.   Musculoskeletal:  Negative for arthralgias.  Skin:  Negative for itching and rash.  Neurological:  Negative for extremity weakness.  Hematological:  Negative for adenopathy.  Psychiatric/Behavioral:  Negative for confusion.      PHYSICAL EXAMINATION: ECOG PERFORMANCE STATUS: 2 - Symptomatic, <50% confined to bed  Vitals:   09/07/23 0901  BP: (!) 154/70  Pulse: 63   Resp: 18  Temp: 98 F (36.7 C)  SpO2: 100%    Filed Weights   09/07/23 0901  Weight: 113 lb 11.2 oz (51.6 kg)      Physical Exam Constitutional:      General: She is not in acute distress.    Appearance: She is not diaphoretic.  HENT:     Head: Normocephalic.  Eyes:     General: No scleral icterus.    Pupils: Pupils are equal, round, and reactive to light.  Cardiovascular:     Rate and Rhythm: Tachycardia present.     Heart sounds: Murmur heard.  Pulmonary:     Effort: Pulmonary effort is normal. No respiratory distress.     Comments: Decreased breath sounds bilaterally Abdominal:     General: There is no distension.  Musculoskeletal:        General: Normal range of motion.     Cervical back: Normal range of motion.  Skin:    Findings: No erythema.  Neurological:     Mental Status: She is alert and oriented to person, place, and time. Mental status is at baseline.     Cranial Nerves: No cranial nerve deficit.     Motor: No abnormal muscle tone.  Psychiatric:        Mood and Affect: Affect normal.      LABORATORY DATA:  I have reviewed the data as listed    Latest Ref Rng & Units 09/07/2023    8:57 AM 08/17/2023    8:53 AM 07/27/2023    8:39 AM  CBC  WBC 4.0 - 10.5 K/uL 7.0  7.2  7.3   Hemoglobin 12.0 - 15.0 g/dL 24.4  01.0  27.2   Hematocrit 36.0 - 46.0 % 34.0  33.8  35.1   Platelets 150 - 400 K/uL 308  242  270       Latest Ref Rng & Units 09/07/2023    8:57 AM 08/17/2023    8:53 AM 07/27/2023    8:39 AM  CMP  Glucose 70 - 99 mg/dL 95  536  644   BUN 8 - 23 mg/dL 16  14  17    Creatinine 0.44 - 1.00 mg/dL 0.34  7.42  5.95   Sodium 135 -  145 mmol/L 137  136  135   Potassium 3.5 - 5.1 mmol/L 3.5  3.5  3.6   Chloride 98 - 111 mmol/L 102  102  102   CO2 22 - 32 mmol/L 25  25  25    Calcium  8.9 - 10.3 mg/dL 9.1  9.0  9.2   Total Protein 6.5 - 8.1 g/dL 7.2  7.4  6.8   Total Bilirubin 0.0 - 1.2 mg/dL 0.3  0.7  0.4   Alkaline Phos 38 - 126 U/L 60  63  56    AST 15 - 41 U/L 20  23  23    ALT 0 - 44 U/L 13  19  14       RADIOGRAPHIC STUDIES: I have personally reviewed the radiological images as listed and agreed with the findings in the report. No results found.

## 2023-09-07 NOTE — Assessment & Plan Note (Signed)
 Immunotherapy treatment plan as listed above.

## 2023-09-07 NOTE — Assessment & Plan Note (Signed)
 Continue KCL daily

## 2023-09-08 LAB — T4: T4, Total: 9.7 ug/dL (ref 4.5–12.0)

## 2023-09-21 ENCOUNTER — Ambulatory Visit
Admission: RE | Admit: 2023-09-21 | Discharge: 2023-09-21 | Disposition: A | Discharge status: Home / Self Care | Source: Ambulatory Visit | Attending: Oncology | Admitting: Oncology

## 2023-09-21 DIAGNOSIS — C3482 Malignant neoplasm of overlapping sites of left bronchus and lung: Secondary | ICD-10-CM | POA: Insufficient documentation

## 2023-09-26 ENCOUNTER — Ambulatory Visit
Admission: RE | Admit: 2023-09-26 | Discharge: 2023-09-26 | Disposition: A | Discharge status: Home / Self Care | Source: Ambulatory Visit | Attending: Internal Medicine | Admitting: Internal Medicine

## 2023-09-26 DIAGNOSIS — I639 Cerebral infarction, unspecified: Secondary | ICD-10-CM

## 2023-09-26 DIAGNOSIS — C3482 Malignant neoplasm of overlapping sites of left bronchus and lung: Secondary | ICD-10-CM

## 2023-09-26 DIAGNOSIS — R9089 Other abnormal findings on diagnostic imaging of central nervous system: Secondary | ICD-10-CM

## 2023-09-26 MED ORDER — HEPARIN SOD (PORK) LOCK FLUSH 100 UNIT/ML IV SOLN
INTRAVENOUS | Status: AC
  Filled 2023-09-26: qty 5

## 2023-09-26 MED ORDER — GADOBUTROL 1 MMOL/ML IV SOLN
5.0000 mL | Freq: Once | INTRAVENOUS | Status: AC | PRN
  Administered 2023-09-26: 5 mL via INTRAVENOUS

## 2023-09-26 MED ORDER — HEPARIN SOD (PORK) LOCK FLUSH 100 UNIT/ML IV SOLN: 500.0000 [IU] | Freq: Once | INTRAVENOUS | Status: DC

## 2023-09-28 ENCOUNTER — Encounter: Payer: Self-pay | Admitting: Oncology

## 2023-09-28 ENCOUNTER — Inpatient Hospital Stay: Attending: Oncology

## 2023-09-28 ENCOUNTER — Inpatient Hospital Stay

## 2023-09-28 ENCOUNTER — Inpatient Hospital Stay (HOSPITAL_BASED_OUTPATIENT_CLINIC_OR_DEPARTMENT_OTHER): Admitting: Oncology

## 2023-09-28 VITALS — BP 130/67 | HR 81 | Temp 97.6°F | Resp 18 | Wt 110.9 lb

## 2023-09-28 DIAGNOSIS — C3412 Malignant neoplasm of upper lobe, left bronchus or lung: Secondary | ICD-10-CM | POA: Insufficient documentation

## 2023-09-28 DIAGNOSIS — C3482 Malignant neoplasm of overlapping sites of left bronchus and lung: Secondary | ICD-10-CM

## 2023-09-28 DIAGNOSIS — C771 Secondary and unspecified malignant neoplasm of intrathoracic lymph nodes: Secondary | ICD-10-CM | POA: Diagnosis not present

## 2023-09-28 DIAGNOSIS — F17210 Nicotine dependence, cigarettes, uncomplicated: Secondary | ICD-10-CM | POA: Insufficient documentation

## 2023-09-28 DIAGNOSIS — D649 Anemia, unspecified: Secondary | ICD-10-CM | POA: Diagnosis not present

## 2023-09-28 DIAGNOSIS — Z86711 Personal history of pulmonary embolism: Secondary | ICD-10-CM

## 2023-09-28 DIAGNOSIS — C679 Malignant neoplasm of bladder, unspecified: Secondary | ICD-10-CM

## 2023-09-28 DIAGNOSIS — I639 Cerebral infarction, unspecified: Secondary | ICD-10-CM

## 2023-09-28 DIAGNOSIS — C787 Secondary malignant neoplasm of liver and intrahepatic bile duct: Secondary | ICD-10-CM | POA: Insufficient documentation

## 2023-09-28 DIAGNOSIS — R634 Abnormal weight loss: Secondary | ICD-10-CM

## 2023-09-28 DIAGNOSIS — Z5112 Encounter for antineoplastic immunotherapy: Secondary | ICD-10-CM | POA: Diagnosis not present

## 2023-09-28 LAB — CMP (CANCER CENTER ONLY)
ALT: 10 U/L (ref 0–44)
AST: 17 U/L (ref 15–41)
Albumin: 3.4 g/dL — ABNORMAL LOW (ref 3.5–5.0)
Alkaline Phosphatase: 55 U/L (ref 38–126)
Anion gap: 9 (ref 5–15)
BUN: 15 mg/dL (ref 8–23)
CO2: 25 mmol/L (ref 22–32)
Calcium: 8.5 mg/dL — ABNORMAL LOW (ref 8.9–10.3)
Chloride: 102 mmol/L (ref 98–111)
Creatinine: 0.97 mg/dL (ref 0.44–1.00)
GFR, Estimated: 58 mL/min — ABNORMAL LOW (ref >60)
Glucose, Bld: 158 mg/dL — ABNORMAL HIGH (ref 70–99)
Potassium: 3.3 mmol/L — ABNORMAL LOW (ref 3.5–5.1)
Sodium: 136 mmol/L (ref 135–145)
Total Bilirubin: 0.6 mg/dL (ref 0.0–1.2)
Total Protein: 6.7 g/dL (ref 6.5–8.1)

## 2023-09-28 LAB — CBC WITH DIFFERENTIAL (CANCER CENTER ONLY)
Abs Immature Granulocytes: 0.07 10*3/uL (ref 0.00–0.07)
Basophils Absolute: 0 10*3/uL (ref 0.0–0.1)
Basophils Relative: 0 %
Eosinophils Absolute: 0.3 10*3/uL (ref 0.0–0.5)
Eosinophils Relative: 4 %
HCT: 34 % — ABNORMAL LOW (ref 36.0–46.0)
Hemoglobin: 10.8 g/dL — ABNORMAL LOW (ref 12.0–15.0)
Immature Granulocytes: 1 %
Lymphocytes Relative: 17 %
Lymphs Abs: 1.2 10*3/uL (ref 0.7–4.0)
MCH: 28.2 pg (ref 26.0–34.0)
MCHC: 31.8 g/dL (ref 30.0–36.0)
MCV: 88.8 fL (ref 80.0–100.0)
Monocytes Absolute: 0.6 10*3/uL (ref 0.1–1.0)
Monocytes Relative: 9 %
Neutro Abs: 5.1 10*3/uL (ref 1.7–7.7)
Neutrophils Relative %: 69 %
Platelet Count: 368 10*3/uL (ref 150–400)
RBC: 3.83 MIL/uL — ABNORMAL LOW (ref 3.87–5.11)
RDW: 16.6 % — ABNORMAL HIGH (ref 11.5–15.5)
WBC Count: 7.3 10*3/uL (ref 4.0–10.5)
nRBC: 0 % (ref 0.0–0.2)

## 2023-09-28 MED ORDER — SODIUM CHLORIDE 0.9 % IV SOLN
200.0000 mg | Freq: Once | INTRAVENOUS | Status: AC
  Administered 2023-09-28: 200 mg via INTRAVENOUS
  Filled 2023-09-28: qty 8

## 2023-09-28 MED ORDER — SODIUM CHLORIDE 0.9% FLUSH
10.0000 mL | INTRAVENOUS | Status: DC | PRN
  Administered 2023-09-28: 10 mL
  Filled 2023-09-28: qty 10

## 2023-09-28 MED ORDER — HEPARIN SOD (PORK) LOCK FLUSH 100 UNIT/ML IV SOLN
500.0000 [IU] | Freq: Once | INTRAVENOUS | Status: AC | PRN
  Administered 2023-09-28: 500 [IU]
  Filled 2023-09-28: qty 5

## 2023-09-28 MED ORDER — SODIUM CHLORIDE 0.9 % IV SOLN
Freq: Once | INTRAVENOUS | Status: AC
  Filled 2023-09-28: qty 250

## 2023-09-28 NOTE — Assessment & Plan Note (Signed)
 Continue nutrition supplement.

## 2023-09-28 NOTE — Assessment & Plan Note (Addendum)
 Non-small cell lung cancer, favor squamous cell carcinoma, stage IV- TPS 90%, TMB 10, KRAS G12V MRI brain with and without contrast -  Left occipital focus of prior hemorrhage with mild linear contrast enhancement. Possible small metastasis or microhemmorhagic event.  TPS 90%, CT in May 2025 showed stable disease Labs are reviewed and discussed with patient.  Proceed with Keytruda 

## 2023-09-28 NOTE — Assessment & Plan Note (Signed)
 off  Elqiuis 5mg  BID due to GI bleeding

## 2023-09-28 NOTE — Patient Instructions (Signed)
 [facility] - A DEPT OF [facility]  Discharge Instructions: Thank you for choosing [facility] to provide your oncology and hematology care.  If you have a lab appointment with the Cancer Center, please go directly to the Cancer Center and check in at the registration area.  Wear comfortable clothing and clothing appropriate for easy access to any Portacath or PICC line.   We strive to give you quality time with your provider. You may need to reschedule your appointment if you arrive late (15 or more minutes).  Arriving late affects you and other patients whose appointments are after yours.  Also, if you miss three or more appointments without notifying the office, you may be dismissed from the clinic at the provider's discretion.      For prescription refill requests, have your pharmacy contact our office and allow 72 hours for refills to be completed.    Today you received the following chemotherapy and/or immunotherapy agents: pembrolizumab    To help prevent nausea and vomiting after your treatment, we encourage you to take your nausea medication as directed.  BELOW ARE SYMPTOMS THAT SHOULD BE REPORTED IMMEDIATELY: *FEVER GREATER THAN 100.4 F (38 C) OR HIGHER *CHILLS OR SWEATING *NAUSEA AND VOMITING THAT IS NOT CONTROLLED WITH YOUR NAUSEA MEDICATION *UNUSUAL SHORTNESS OF BREATH *UNUSUAL BRUISING OR BLEEDING *URINARY PROBLEMS (pain or burning when urinating, or frequent urination) *BOWEL PROBLEMS (unusual diarrhea, constipation, pain near the anus) TENDERNESS IN MOUTH AND THROAT WITH OR WITHOUT PRESENCE OF ULCERS (sore throat, sores in mouth, or a toothache) UNUSUAL RASH, SWELLING OR PAIN  UNUSUAL VAGINAL DISCHARGE OR ITCHING   Items with * indicate a potential emergency and should be followed up as soon as possible or go to the Emergency Department if any problems should occur.  Please show the CHEMOTHERAPY ALERT CARD or IMMUNOTHERAPY  ALERT CARD at check-in to the Emergency Department and triage nurse.  Should you have questions after your visit or need to cancel or reschedule your appointment, please contact [facility] - A DEPT OF [facility]  [phone] and follow the prompts.  Office hours are 8:00 a.m. to 4:30 p.m. Monday - Friday. Please note that voicemails left after 4:00 p.m. may not be returned until the following business day.  We are closed weekends and major holidays. You have access to a nurse at all times for urgent questions. Please call the main number to the clinic [phone] and follow the prompts.  For any non-urgent questions, you may also contact your provider using MyChart. We now offer e-Visits for anyone 38 and older to request care online for non-urgent symptoms. For details visit [email or web address].   Also download the MyChart app! Go to the app store, search "MyChart", open the app, select [place], and log in with your MyChart username and password.

## 2023-09-28 NOTE — Assessment & Plan Note (Signed)
 Immunotherapy treatment plan as listed above.

## 2023-09-28 NOTE — Assessment & Plan Note (Signed)
 History of small acute infarcts on MRI brain.  She is asymptomatic, stenosis of the right ICA  Follow up with neurology Dr. Mark Sil

## 2023-09-28 NOTE — Progress Notes (Signed)
 Hematology/Oncology Progress note [phone] [phone]       REASON OF VISIT Stage IV Lung squamous cell carcinoma  ASSESSMENT & PLAN:   Cancer Staging  Malignant neoplasm of lung (HCC) Staging form: Lung, AJCC 8th Edition - Clinical: Stage IVA (cT4, cN3, cM1a) - Signed by Timmy Forbes, MD on 06/28/2022  Urothelial carcinoma of bladder [facility]) Staging form: Urinary Bladder, AJCC 8th Edition - Clinical stage from 12/21/2022: Stage I (cT1, cN0, cM0) - Signed by Timmy Forbes, MD on 12/29/2022   Malignant neoplasm of lung (HCC) Non-small cell lung cancer, favor squamous cell carcinoma, stage IV- TPS 90%, TMB 10, KRAS G12V MRI brain with and without contrast -  Left occipital focus of prior hemorrhage with mild linear contrast enhancement. Possible small metastasis or microhemmorhagic event.  TPS 90%, CT in May 2025 showed stable disease Labs are reviewed and discussed with patient.  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 S/p TURBT, T1 lesion, high grade.  03/29/2023 Non-muscle invasive bladder cancer. S/p TURBT Patient is s/p intravesical BCG treatments-she is off  suppressive antibiotics Follow-up with urology  Encounter for antineoplastic immunotherapy Immunotherapy treatment plan as listed above.  History of pulmonary embolism off  Elqiuis 5mg  BID due to GI bleeding  Weight loss Continue nutrition supplement  CVA (cerebral vascular accident) (HCC) History of small acute infarcts on MRI brain.  She is asymptomatic, stenosis of the right ICA  Follow up with neurology Dr. Mark Sil       No orders of the defined types were placed in this encounter.  Follow up in 3 weeks.   All questions were answered. The patient knows to call the clinic with any problems, questions or concerns.  Timmy Forbes, MD, PhD [facility] Hematology Oncology 09/28/2023       HISTORY OF PRESENTING ILLNESS:  Regina Perkins 83 y.o. 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 I have reviewed her chart and materials related to her cancer extensively and collaborated history with the patient. Summary of oncologic history is as follows: Oncology History  Malignant neoplasm of lung (HCC)  05/24/2022 Imaging   CT chest angiogram showed 1. No evidence for pulmonary embolism. 2. Left upper lobe/prevascular mass worrisome for neoplasm. 3. Mediastinal and hilar lymphadenopathy. 4. Multiple pulmonary nodules measuring up to 11 mm worrisome for metastatic disease. 5. Small left pleural effusion. 6. Patchy ground-glass and airspace opacities in the left upper lobe worrisome for infection. 7. Moderate-sized pericardial effusion. 8. Cholelithiasis. 9. Nonobstructing left renal calculus   05/25/2022 Imaging   CT abdomen pelvis wo contrast 1. No acute findings or explanation for the patient's symptoms. 2. No evidence of primary malignancy or metastatic disease within the abdomen or pelvis on noncontrast imaging.  3. Cholelithiasis without evidence of cholecystitis or biliary dilatation. 4. Nonobstructing left renal calculus. 5. Moderate stool throughout the colon suggesting constipation. 6.  Aortic Atherosclerosis    05/26/2022 Initial Diagnosis   Malignant neoplasm of lung - TPS 90%, TMB 10, KRAS G12V  -05/22/2022 in the emergency room, she was found to have hypoxia with oxygen levels of 86% on room air, improved to 94 with 2 L of oxygen.  D-dimer was elevated at 1.29, troponin negative.  Lower extremity Doppler was negative for DVT.  VQ scan high probability PE (Large segmental perfusion defect of the left upper lobe without corresponding radiographic abnormality. Additional bilateral wedge-shaped  subsegmental branch perfusion defects .  Patient was admitted and started on heparin  Echocardiogram showed no RV strain.   Moderate pericardial effusion. Over her hospitalization, she continues to have shortness of breath with minimal exertion. 05/24/2022, CT chest angiogram showed left lung mass with bilateral lung nodules.   06/16/2022 Imaging   PET scan  1. 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 No evidence of metastatic disease in the abdomen or pelvis. 2. Moderate pericardial effusion. 3. Tiny left pleural effusion. 4. Left renal stone. 5. Aortic atherosclerosis (ICD10-I70.0). Coronary artery calcification. 6. 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    06/28/2022 Cancer Staging   Staging form: Lung, AJCC 8th Edition - Clinical: Stage IVA (cT4, cN3, cM1a) - Signed by Timmy Forbes, MD on 06/28/2022   07/14/2022 -  Chemotherapy   Patient is on Treatment Plan : LUNG NSCLC Pembrolizumab  (200) q21d     09/29/2022 Imaging   CT chest abdomen pelvis w contrast showed Bilateral pulmonary nodules, some of which are waxing/waning, but favoring multifocal lung carcinoma/metastases.   Improving thoracic nodal metastases, as above.  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 Consider cystoscopy as clinically warranted.  Aortic Atherosclerosis (ICD10-I70.0) and Emphysema (ICD10-J43.9)   01/06/2023 Imaging   CT chest abdomen pelvis w contrast showed 1. Interval decrease in size of multiple spiculated and subsolid pulmonary nodules, consistent with treatment response. 2. No significant change in matted post treatment prevascular and left superior  mediastinal soft tissue. 3. Diminished size of an endoluminal bladder mass seen on prior examination, soft tissue in this vicinity now measuring no greater than 2.1 x 1.4 cm, previously at least 4.7 x 2.1 cm. Presumably this has been at least partially resected. 4. No evidence of lymphadenopathy or metastatic disease in the abdomen or pelvis. 5. Cholelithiasis. 6. Nonobstructive left nephrolithiasis. 7. Coronary artery disease.   01/10/2023 Procedure   Medi port placed by IR   05/11/2023 Imaging   CT chest abdomen pelvis w contrast showed 1. Unchanged matted, treated prevascular and left superior mediastinal soft tissue. 2. Multiple bilateral spiculated and ground-glass pulmonary nodules not significantly changed. 3. No residual mass of the posterior right aspect of the urinary bladder. Mild residual thickening in this vicinity. Interval placement of right-sided double-J ureteral stent catheter. 4. No evidence of lymphadenopathy or metastatic disease in the abdomen or pelvis. 5. Emphysema and diffuse bilateral bronchial wall thickening. 6. Coronary artery disease.   Aortic Atherosclerosis (ICD10-I70.0) and Emphysema (ICD10-J43.9).    Urothelial carcinoma of bladder (HCC)  10/06/2022 Initial Diagnosis   Urothelial carcinoma of bladder S/p TURBT  1. 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 Bladder, biopsy, Base of tumor HIGH GRADE PAPILLARY UROTHELIAL CARCINOMA SUBMUCOSA AND MUSCULARIS PROPRIA IS PRESENT AND NOT INVOLVED BY CARCINOMA  Postprocedure, patient developed gross hematuria and was admitted to the hospital.  She received PRBC transfusion.  Hemoglobin improved and she was discharged.    12/21/2022 Cancer Staging   Staging form: Urinary Bladder, AJCC 8th Edition -  Clinical stage from 12/21/2022:  Stage I (cT1, cN0, cM0) - Signed by Timmy Forbes, MD on 12/29/2022 WHO/ISUP grade (low/high): High Grade Histologic grading system: 2 grade system     INTERVAL HISTORY Regina Perkins is a 83 y.o. female who has above history reviewed by me today presents for follow up visit for Lung squamous cell carcinoma She tolerates [place] well. Denies skin rash, diarrhea.  Patient has no new complaints.  Denies chest pain, chest palpitation. She denies headache, focal weakness or slurred speech.  Lost weight   MEDICAL HISTORY:  Past Medical History:  Diagnosis Date   Atherosclerosis of native arteries of extremity with intermittent claudication (HCC)    Bladder mass    Chronic kidney disease, stage 3a (HCC)    COPD (chronic obstructive pulmonary disease) (HCC)    Diabetes mellitus without complication (HCC)    Gastrointestinal hemorrhage    GERD (gastroesophageal reflux disease)    Glaucoma    History of duodenal ulcer    HTN (hypertension)    Hypertension    Hypokalemia    Hypomagnesemia    Leukocytosis    Lung mass    Malignant neoplasm of lung (HCC) 06/28/2022   a.) stage IVA (cT4, cN3, cM1a) favor squamous cell carcinoma --> TPS 90%, TMB 10, KRAS G12V   Malnutrition (HCC)    Normocytic anemia    Pericardial effusion 05/2022   Pulmonary embolism (HCC)    Sepsis (HCC)    Tobacco abuse    Type II diabetes mellitus with renal manifestations (HCC)    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   a.) stage I (cT1, cN0, cM0)    SURGICAL HISTORY: Past Surgical History:  Procedure Laterality Date   ABDOMINAL HYSTERECTOMY     BACK SURGERY     1988 and 1998   CYSTOSCOPY W/ RETROGRADES Bilateral 12/21/2022   Procedure: CYSTOSCOPY WITH RETROGRADE PYELOGRAM;  Surgeon: Geraline Knapp, MD;  Location: [facility];  Service: Urology;  Laterality: Bilateral;   CYSTOSCOPY WITH BIOPSY N/A 03/29/2023   Procedure: CYSTOSCOPY WITH BLADDER BIOPSY;  Surgeon: Geraline Knapp, MD;  Location: [facility];  Service: Urology;  Laterality: N/A;   CYSTOSCOPY WITH STENT PLACEMENT Right 03/29/2023   Procedure: CYSTOSCOPY WITH STENT PLACEMENT;  Surgeon: Geraline Knapp, MD;  Location: [facility];  Service: Urology;  Laterality: Right;   ESOPHAGOGASTRODUODENOSCOPY (EGD) WITH PROPOFOL  N/A 07/29/2022   Procedure: ESOPHAGOGASTRODUODENOSCOPY (EGD) WITH PROPOFOL ;  Surgeon: Luke Salaam, MD;  Location[facility] END[facility]COPY;  Service: Gastroenterology;  Laterality: N/A;   ESOPHAGOGASTRODUODENOSCOPY (EGD) WITH PROPOFOL  N/A 01/21/2023   Procedure: ESOPHAGOGASTRODUODENOSCOPY (EGD) WITH PROPOFOL ;  Surgeon: Luke Salaam, MD;  Location: [facility] ENDOSCOPY;  Service: Gastroenterology;  Laterality: N/A;   HOLMIUM LASER APPLICATION Right 03/29/2023   Procedure: HOLMIUM LASER ABLATION;  Surgeon: Geraline Knapp, MD;  Location: [facility];  Service: Urology;  Laterality: Right;   IR IMAGING GUIDED PORT INSERTION  01/10/2023   TRANSURETHRAL RESECTION OF BLADDER TUMOR N/A 12/21/2022   Procedure: TRANSURETHRAL RESECTION OF BLADDER TUMOR (TURBT);  Surgeon: Geraline Knapp, MD;  Location: [facility];  Service: Urology;  Laterality: N/A;   TRANSURETHRAL RESECTION OF BLADDER TUMOR N/A 03/29/2023   Procedure: TRANSURETHRAL RESECTION OF BLADDER TUMOR (TURBT);  Surgeon: Geraline Knapp, MD;  Location: [facility];  Service: Urology;  Laterality: N/A;   URETEROSCOPY Right 03/29/2023   Procedure: DIAGNOSTIC URETEROSCOPY;  Surgeon: Geraline Knapp, MD;  Location: [facility];  Service: Urology;  Laterality: Right;    SOCIAL HISTORY: Social History   Socioeconomic History  Marital status: Widowed    Spouse name: Not on file   Number of children: Not on file   Years of education: Not on file   Highest education level: Not on file  Occupational History   Not on file  Tobacco Use   Smoking status: Every Day    Current packs/day: 0.25    Types: Cigarettes   Smokeless tobacco: Never  Vaping Use   Vaping status: Never  Used  Substance and Sexual Activity   Alcohol use: No   Drug use: No   Sexual activity: Not on file  Other Topics Concern   Not on file  Social History Narrative   Not on file   Social Drivers of Health   Financial Resource Strain: Low Risk  (06/28/2022)   Overall Financial Resource Strain (CARDIA)    Difficulty of Paying Living Expenses: Not very hard  Food Insecurity: No Food Insecurity (12/22/2022)   Hunger Vital Sign    Worried About Running Out of Food in the Last Year: Never true    Ran Out of Food in the Last Year: Never true  Transportation Needs: No Transportation Needs (12/22/2022)   PRAPARE - Administrator, Civil Service (Medical): No    Lack of Transportation (Non-Medical): No  Physical Activity: Not on file  Stress: No Stress Concern Present (06/28/2022)   [employer] of Occupational Health - Occupational Stress Questionnaire    Feeling of Stress : Not at all  Social Connections: Not on file  Intimate Partner Violence: Not At Risk (12/22/2022)   Humiliation, Afraid, Rape, and Kick questionnaire    Fear of Current or Ex-Partner: No    Emotionally Abused: No    Physically Abused: No    Sexually Abused: No    FAMILY HISTORY: Family History  Problem Relation Age of Onset   Asthma Mother    Heart disease Mother    Hypertension Mother    Diabetes Mother    Stroke Mother    Diabetes Sister    Diabetes Brother     ALLERGIES:  is allergic to ivp dye [iodinated contrast media] and shellfish allergy.  MEDICATIONS:  Current Outpatient Medications  Medication Sig Dispense Refill   albuterol  (VENTOLIN  HFA) 108 (90 Base) MCG/ACT inhaler Inhale 2 puffs into the lungs every 4 (four) hours as needed for wheezing or shortness of breath.     amLODipine  (NORVASC ) 10 MG tablet Take 10 mg by mouth every morning.     aspirin  EC 81 MG tablet Take 1 tablet (81 mg total) by mouth daily. Swallow whole. 30 tablet 12   cyanocobalamin  (VITAMIN B12) 1000 MCG tablet  Take 1 tablet (1,000 mcg total) by mouth daily. 90 tablet 0   lidocaine -prilocaine  (EMLA ) cream Apply 1 Application topically as needed. Apply to port and cover with saran wrap 1-2 hours prior to port access 30 g 1   lisinopril  (ZESTRIL ) 40 MG tablet Take 40 mg by mouth every morning.     metFORMIN  (GLUCOPHAGE ) 1000 MG tablet Take 1,000 mg by mouth daily with breakfast.     Current Facility-Administered Medications  Medication Dose Route Frequency Provider Last Rate Last Admin   bcg vaccine injection 81 mg  3.24 mL Bladder Instillation Once        Facility-Administered Medications Ordered in Other Visits  Medication Dose Route Frequency Provider Last Rate Last Admin   sodium chloride  flush (NS) 0.9 % injection 10 mL  10 mL Intracatheter PRN Timmy Forbes, MD   10  mL at 09/28/23 1000    Review of Systems  Constitutional:  Negative for appetite change, chills, fatigue and fever.  HENT:   Negative for hearing loss and voice change.   Eyes:  Negative for eye problems.  Respiratory:  Negative for chest tightness, cough and shortness of breath.   Cardiovascular:  Negative for chest pain.  Gastrointestinal:  Negative for abdominal distention, abdominal pain and blood in stool.  Endocrine: Negative for hot flashes.  Genitourinary:  Negative for difficulty urinating and frequency.   Musculoskeletal:  Negative for arthralgias.  Skin:  Negative for itching and rash.  Neurological:  Negative for extremity weakness.  Hematological:  Negative for adenopathy.  Psychiatric/Behavioral:  Negative for confusion.      PHYSICAL EXAMINATION: ECOG PERFORMANCE STATUS: 2 - Symptomatic, <50% confined to bed  Vitals:   09/28/23 0905  BP: 130/67  Pulse: 81  Resp: 18  Temp: 97.6 F (36.4 C)  SpO2: 98%    Filed Weights   09/28/23 0905  Weight: 110 lb 14.4 oz (50.3 kg)      Physical Exam Constitutional:      General: She is not in acute distress.    Appearance: She is not diaphoretic.  HENT:      Head: Normocephalic.  Eyes:     General: No scleral icterus.    Pupils: Pupils are equal, round, and reactive to light.  Cardiovascular:     Rate and Rhythm: Tachycardia present.     Heart sounds: Murmur heard.  Pulmonary:     Effort: Pulmonary effort is normal. No respiratory distress.     Comments: Decreased breath sounds bilaterally Abdominal:     General: There is no distension.  Musculoskeletal:        General: Normal range of motion.     Cervical back: Normal range of motion.  Skin:    Findings: No erythema.  Neurological:     Mental Status: She is alert and oriented to person, place, and time. Mental status is at baseline.     Cranial Nerves: No cranial nerve deficit.     Motor: No abnormal muscle tone.  Psychiatric:        Mood and Affect: Affect normal.      LABORATORY DATA:  I have reviewed the data as listed    Latest Ref Rng & Units 09/28/2023    8:49 AM 09/07/2023    8:57 AM 08/17/2023    8:53 AM  CBC  WBC 4.0 - 10.5 K/uL 7.3  7.0  7.2   Hemoglobin 12.0 - 15.0 g/dL 16.1  09.6  04.5   Hematocrit 36.0 - 46.0 % 34.0  34.0  33.8   Platelets 150 - 400 K/uL 368  308  242       Latest Ref Rng & Units 09/28/2023    8:49 AM 09/07/2023    8:57 AM 08/17/2023    8:53 AM  CMP  Glucose 70 - 99 mg/dL 409  95  811   BUN 8 - 23 mg/dL 15  16  14    Creatinine 0.44 - 1.00 mg/dL 9.14  7.82  9.56   Sodium 135 - 145 mmol/L 136  137  136   Potassium 3.5 - 5.1 mmol/L 3.3  3.5  3.5   Chloride 98 - 111 mmol/L 102  102  102   CO2 22 - 32 mmol/L 25  25  25    Calcium  8.9 - 10.3 mg/dL 8.5  9.1  9.0   Total Protein 6.5 -  8.1 g/dL 6.7  7.2  7.4   Total Bilirubin 0.0 - 1.2 mg/dL 0.6  0.3  0.7   Alkaline Phos 38 - 126 U/L 55  60  63   AST 15 - 41 U/L 17  20  23    ALT 0 - 44 U/L 10  13  19       RADIOGRAPHIC STUDIES: I have personally reviewed the radiological images as listed and agreed with the findings in the report. MR BRAIN W WO CONTRAST Result Date: 09/26/2023 CLINICAL DATA:   Stroke, follow up; Brain/CNS neoplasm, assess treatment response EXAM: MRI HEAD WITHOUT CONTRAST MRA HEAD WITHOUT CONTRAST MRA NECK WITHOUT AND WITH CONTRAST TECHNIQUE: Multiplanar, multi-echo pulse sequences of the brain and surrounding structures were acquired without intravenous contrast. Angiographic images of the Circle of Willis were acquired using MRA technique without intravenous contrast. Angiographic images of the neck were acquired using MRA technique without and with intravenous contrast. Carotid stenosis measurements (when applicable) are obtained utilizing NASCET criteria, using the distal internal carotid diameter as the denominator. CONTRAST:  5mL GADAVIST  GADOBUTROL  1 MMOL/ML IV SOLN COMPARISON:  MRI head June 22, 2023. FINDINGS: MRI HEAD FINDINGS Brain: No acute infarction, hemorrhage, hydrocephalus, extra-axial collection or mass lesion. No pathologic intracranial enhancement. Remote perforator infarcts in the left basal ganglia and right thalamus. Moderate patchy T2/FLAIR hyperintensity in the white matter, nonspecific but compatible with chronic microvascular ischemic disease. Vascular: See below. Skull and upper cervical spine: Normal marrow signal. Sinuses/Orbits: Clear sinuses.  No acute orbital findings. Other: No mastoid effusions. MRA HEAD FINDINGS Anterior circulation: Bilateral intracranial ICAs, MCAs, and ACAs are patent without proximal hemodynamically significant stenosis. Posterior circulation: Bilateral intradural vertebral arteries, basilar artery, and bilateral posterior rods are patent without proximal hemodynamically significant stenosis. Left fetal type PCA, anatomic variant. MRA NECK FINDINGS Aortic arch: Great vessel origins are patent. Right carotid system: Atherosclerosis at the carotid bifurcation with severe stenosis of the ICA origin. Left carotid system: Patent without significant stenosis. Vertebral arteries: Patent bilaterally without significant stenosis. Left  dominant. IMPRESSION: 1. 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 A CTA of the neck could better quantify if clinically warranted. 2. No evidence of acute intracranial abnormality. 3. No large vessel occlusion or proximal hemodynamically significant stenosis intracranially. Electronically Signed   By: Stevenson Elbe M.D.   On: 09/26/2023 20:29   MR ANGIO NECK W WO CONTRAST Result Date: 09/26/2023 CLINICAL DATA:  Stroke, follow up; Brain/CNS neoplasm, assess treatment response EXAM: MRI HEAD WITHOUT CONTRAST MRA HEAD WITHOUT CONTRAST MRA NECK WITHOUT AND WITH CONTRAST TECHNIQUE: Multiplanar, multi-echo pulse sequences of the brain and surrounding structures were acquired without intravenous contrast. Angiographic images of the Circle of Willis were acquired using MRA technique without intravenous contrast. Angiographic images of the neck were acquired using MRA technique without and with intravenous contrast. Carotid stenosis measurements (when applicable) are obtained utilizing NASCET criteria, using the distal internal carotid diameter as the denominator. CONTRAST:  5mL GADAVIST  GADOBUTROL  1 MMOL/ML IV SOLN COMPARISON:  MRI head June 22, 2023. FINDINGS: MRI HEAD FINDINGS Brain: No acute infarction, hemorrhage, hydrocephalus, extra-axial collection or mass lesion. No pathologic intracranial enhancement. Remote perforator infarcts in the left basal ganglia and right thalamus. Moderate patchy T2/FLAIR hyperintensity in the white matter, nonspecific but compatible with chronic microvascular ischemic disease. Vascular: See below. Skull and upper cervical spine: Normal marrow signal. Sinuses/Orbits: Clear sinuses.  No acute orbital findings. Other: No mastoid effusions. MRA HEAD FINDINGS Anterior circulation: Bilateral intracranial  ICAs, MCAs, and ACAs are patent without proximal hemodynamically significant stenosis. Posterior circulation: Bilateral intradural vertebral arteries, basilar artery, and  bilateral posterior rods are patent without proximal hemodynamically significant stenosis. Left fetal type PCA, anatomic variant. MRA NECK FINDINGS Aortic arch: Great vessel origins are patent. Right carotid system: Atherosclerosis at the carotid bifurcation with severe stenosis of the ICA origin. Left carotid system: Patent without significant stenosis. Vertebral arteries: Patent bilaterally without significant stenosis. Left dominant. IMPRESSION: 1. 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 A CTA of the neck could better quantify if clinically warranted. 2. No evidence of acute intracranial abnormality. 3. No large vessel occlusion or proximal hemodynamically significant stenosis intracranially. Electronically Signed   By: Stevenson Elbe M.D.   On: 09/26/2023 20:29   MR ANGIO HEAD WO CONTRAST Result Date: 09/26/2023 CLINICAL DATA:  Stroke, follow up; Brain/CNS neoplasm, assess treatment response EXAM: MRI HEAD WITHOUT CONTRAST MRA HEAD WITHOUT CONTRAST MRA NECK WITHOUT AND WITH CONTRAST TECHNIQUE: Multiplanar, multi-echo pulse sequences of the brain and surrounding structures were acquired without intravenous contrast. Angiographic images of the Circle of Willis were acquired using MRA technique without intravenous contrast. Angiographic images of the neck were acquired using MRA technique without and with intravenous contrast. Carotid stenosis measurements (when applicable) are obtained utilizing NASCET criteria, using the distal internal carotid diameter as the denominator. CONTRAST:  5mL GADAVIST  GADOBUTROL  1 MMOL/ML IV SOLN COMPARISON:  MRI head June 22, 2023. FINDINGS: MRI HEAD FINDINGS Brain: No acute infarction, hemorrhage, hydrocephalus, extra-axial collection or mass lesion. No pathologic intracranial enhancement. Remote perforator infarcts in the left basal ganglia and right thalamus. Moderate patchy T2/FLAIR hyperintensity in the white matter, nonspecific but compatible with chronic  microvascular ischemic disease. Vascular: See below. Skull and upper cervical spine: Normal marrow signal. Sinuses/Orbits: Clear sinuses.  No acute orbital findings. Other: No mastoid effusions. MRA HEAD FINDINGS Anterior circulation: Bilateral intracranial ICAs, MCAs, and ACAs are patent without proximal hemodynamically significant stenosis. Posterior circulation: Bilateral intradural vertebral arteries, basilar artery, and bilateral posterior rods are patent without proximal hemodynamically significant stenosis. Left fetal type PCA, anatomic variant. MRA NECK FINDINGS Aortic arch: Great vessel origins are patent. Right carotid system: Atherosclerosis at the carotid bifurcation with severe stenosis of the ICA origin. Left carotid system: Patent without significant stenosis. Vertebral arteries: Patent bilaterally without significant stenosis. Left dominant. IMPRESSION: 1. 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 A CTA of the neck could better quantify if clinically warranted. 2. No evidence of acute intracranial abnormality. 3. No large vessel occlusion or proximal hemodynamically significant stenosis intracranially. Electronically Signed   By: Stevenson Elbe M.D.   On: 09/26/2023 20:29   CT CHEST ABDOMEN PELVIS WO CONTRAST Result Date: 09/21/2023 EXAMINATION: CT CHEST ABDOMEN PELVIS WO CONTRAST CLINICAL INDICATION: Female, 83 years old. lung cancer TECHNIQUE: Helical CT scan examination of the chest, abdomen, and pelvis is performed from the domes of the diaphragm to the pubic symphysis. Unless otherwise specified, incidental thyroid , adrenal, renal lesions do not require dedicated imaging follow up. Additionally, any mentioned pulmonary nodules do not require dedicated imaging follow-up based on the Fleischner guidelines unless otherwise specified. Coronary calcifications are not identified unless otherwise specified. COMPARISON: 05/11/2023 FINDINGS: CHEST: Right chest wall Mediport catheter tip  terminates in the SVC. The thyroid  appears normal. The thoracic aorta is not aneurysmal. Scattered calcified atherosclerotic changes are present. The main pulmonary artery is borderline enlarged. The heart is normal in size. There are coronary calcifications. There is  similar matted lymphadenopathy in the prevascular space measuring up to 10 mm in thickness (series 2 image 20). The trachea and mainstem bronchi appear patent. There are similar subtle groundglass bilateral pulmonary nodules for instance in the right upper lobe there is an unchanged 9 mm nodule (series 2 image 15). No new or enlarging pulmonary nodules are appreciated. ABDOMEN/PELVIS: The liver contains scattered cysts. There is cholelithiasis. The spleen is normal. Pancreas is normal. Adrenals are normal. Small nonobstructing left intrarenal stone noted. Right ureteral stent is seen. The abdominal aorta is normal in caliber. Scattered calcified atherosclerotic changes are present. The bladder is normal. The uterus is surgically absent. Large and small bowel loops are otherwise within normal limits. No free fluid or adenopathy. BONES: There are degenerative changes of the spine. Lumbar spinal fusion noted. There are degenerative changes of the bony pelvis. IMPRESSION: Similar matted prevascular mediastinal adenopathy. Similar scattered groundglass bilateral pulmonary nodules No evidence for metastatic disease within the abdomen or pelvis. DOSE REDUCTION: All CT scans are performed using radiation dose reduction techniques, when applicable. Technical factors are evaluated and adjusted to ensure appropriate moderation of exposure. Electronically signed by: [place] Engel MD 09/21/2023 07:00 PM EDT RP Workstation: [id number]

## 2023-09-28 NOTE — Assessment & Plan Note (Addendum)
 12/21/2022 Non-muscle invasive bladder cancer. S/p TURBT, T1 lesion, high grade.  03/29/2023 Non-muscle invasive bladder cancer. S/p TURBT Patient is s/p intravesical BCG treatments-she is off  suppressive antibiotics Follow-up with urology

## 2023-10-14 ENCOUNTER — Inpatient Hospital Stay (HOSPITAL_BASED_OUTPATIENT_CLINIC_OR_DEPARTMENT_OTHER): Admitting: Internal Medicine

## 2023-10-14 VITALS — BP 143/76 | HR 97 | Temp 97.0°F | Resp 17 | Wt 111.0 lb

## 2023-10-14 DIAGNOSIS — I63231 Cerebral infarction due to unspecified occlusion or stenosis of right carotid arteries: Secondary | ICD-10-CM | POA: Diagnosis not present

## 2023-10-14 DIAGNOSIS — Z5112 Encounter for antineoplastic immunotherapy: Secondary | ICD-10-CM | POA: Diagnosis not present

## 2023-10-14 NOTE — Progress Notes (Signed)
 [facility] [address]  [place], [place] [address] [phone]   Interval Evaluation  Date of Service: 10/14/23 Patient Name: Regina Perkins Patient MRN: [id number] Patient DOB: May 14, 1940 Provider: Mamie Searles, MD  Identifying Statement:  COREENA RUBALCAVA is a 83 y.o. female with No diagnosis found.   Primary Cancer:  Oncologic History: Oncology History  Malignant neoplasm of lung (HCC)  05/24/2022 Imaging   CT chest angiogram showed 1. No evidence for pulmonary embolism. 2. Left upper lobe/prevascular mass worrisome for neoplasm. 3. Mediastinal and hilar lymphadenopathy. 4. Multiple pulmonary nodules measuring up to 11 mm worrisome for metastatic disease. 5. Small left pleural effusion. 6. Patchy ground-glass and airspace opacities in the left upper lobe worrisome for infection. 7. Moderate-sized pericardial effusion. 8. Cholelithiasis. 9. Nonobstructing left renal calculus   05/25/2022 Imaging   CT abdomen pelvis wo contrast 1. No acute findings or explanation for the patient's symptoms. 2. No evidence of primary malignancy or metastatic disease within the abdomen or pelvis on noncontrast imaging.  3. Cholelithiasis without evidence of cholecystitis or biliary dilatation. 4. Nonobstructing left renal calculus. 5. Moderate stool throughout the colon suggesting constipation. 6.  Aortic Atherosclerosis    05/26/2022 Initial Diagnosis   Malignant neoplasm of lung - TPS 90%, TMB 10, KRAS G12V  -05/22/2022 in the emergency room, she was found to have hypoxia with oxygen levels of 86% on room air, improved to 94 with 2 L of oxygen.  D-dimer was elevated at 1.29, troponin negative.  Lower extremity Doppler was negative for DVT.  VQ scan high probability PE (Large segmental perfusion defect of the left upper lobe without corresponding radiographic abnormality. Additional bilateral wedge-shaped subsegmental branch perfusion defects .   Patient was admitted and started on heparin  Echocardiogram showed no RV strain.  Moderate pericardial effusion. Over her hospitalization, she continues to have shortness of breath with minimal exertion. 05/24/2022, CT chest angiogram showed left lung mass with bilateral lung nodules.   06/16/2022 Imaging   PET scan  1. 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 No evidence of metastatic disease in the abdomen or pelvis. 2. Moderate pericardial effusion. 3. Tiny left pleural effusion. 4. Left renal stone. 5. Aortic atherosclerosis (ICD10-I70.0). Coronary artery calcification. 6. 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    06/28/2022 Cancer Staging   Staging form: Lung, AJCC 8th Edition - Clinical: Stage IVA (cT4, cN3, cM1a) - Signed by Timmy Forbes, MD on 06/28/2022   07/14/2022 -  Chemotherapy   Patient is on Treatment Plan : LUNG NSCLC Pembrolizumab  (200) q21d     09/29/2022 Imaging   CT chest abdomen pelvis w contrast showed Bilateral pulmonary nodules, some of which are waxing/waning, but favoring multifocal lung carcinoma/metastases.   Improving thoracic nodal metastases, as above.  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 Consider cystoscopy as clinically warranted.  Aortic Atherosclerosis (ICD10-I70.0) and Emphysema (ICD10-J43.9)   01/06/2023 Imaging   CT chest abdomen pelvis w contrast showed 1. Interval decrease in size of multiple spiculated and subsolid pulmonary nodules, consistent with treatment response. 2.  No significant change in matted post treatment prevascular and  left superior mediastinal soft tissue. 3. Diminished size of an endoluminal bladder mass seen on prior examination, soft tissue in this vicinity now measuring no greater than 2.1 x 1.4 cm, previously at least 4.7 x 2.1 cm. Presumably this has been at least partially resected. 4. No evidence of lymphadenopathy or metastatic disease in the abdomen or pelvis. 5. Cholelithiasis. 6. Nonobstructive left nephrolithiasis. 7. Coronary artery disease.   01/10/2023 Procedure   Medi port placed by IR   05/11/2023 Imaging   CT chest abdomen pelvis w contrast showed 1. Unchanged matted, treated prevascular and left superior mediastinal soft tissue. 2. Multiple bilateral spiculated and ground-glass pulmonary nodules not significantly changed. 3. No residual mass of the posterior right aspect of the urinary bladder. Mild residual thickening in this vicinity. Interval placement of right-sided double-J ureteral stent catheter. 4. No evidence of lymphadenopathy or metastatic disease in the abdomen or pelvis. 5. Emphysema and diffuse bilateral bronchial wall thickening. 6. Coronary artery disease.   Aortic Atherosclerosis (ICD10-I70.0) and Emphysema (ICD10-J43.9).    Urothelial carcinoma of bladder (HCC)  10/06/2022 Initial Diagnosis   Urothelial carcinoma of bladder S/p TURBT  1. 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 Bladder, biopsy, Base of tumor HIGH GRADE PAPILLARY UROTHELIAL CARCINOMA SUBMUCOSA AND MUSCULARIS PROPRIA IS PRESENT AND NOT INVOLVED BY CARCINOMA  Postprocedure, patient developed gross hematuria and was admitted to the hospital.  She received PRBC transfusion.  Hemoglobin improved and she was discharged.     12/21/2022 Cancer Staging   Staging form: Urinary Bladder, AJCC 8th Edition - Clinical stage from 12/21/2022: Stage I (cT1, cN0, cM0) - Signed by Timmy Forbes, MD on 12/29/2022 WHO/ISUP grade (low/high): High Grade Histologic grading system: 2 grade system     Interval History: Shalynn L Barbuto presents today for follow up after recent brain imaging.  She describes no new or progressive neurologic deficits.  No headaches or seizures.  Continues on Keytruda  with Dr. Wilhelmenia Harada.    H+P (08/19/23) Patient presents today to evaluate recent findings on MRI brain.  She denies any neurologic symptoms.  No slurred speech, hand weakness, or gait difficulties aside from orthopedic limitations.  She is taking aspirin  81mg  daily since March.  Continues on Keytruda  with Dr. Wilhelmenia Harada.  Medications: Current Outpatient Medications on File Prior to Visit  Medication Sig Dispense Refill   albuterol  (VENTOLIN  HFA) 108 (90 Base) MCG/ACT inhaler Inhale 2 puffs into the lungs every 4 (four) hours as needed for wheezing or shortness of breath.     amLODipine  (NORVASC ) 10 MG tablet Take 10 mg by mouth every morning.     aspirin  EC 81 MG tablet Take 1 tablet (81 mg total) by mouth daily. Swallow whole. 30 tablet 12   cyanocobalamin  (VITAMIN B12) 1000 MCG tablet Take 1 tablet (1,000 mcg total) by mouth daily. 90 tablet 0   lidocaine -prilocaine  (EMLA ) cream Apply 1 Application topically as needed. Apply to port and cover with saran wrap 1-2 hours prior to port access 30 g 1   lisinopril  (ZESTRIL ) 40 MG tablet Take 40 mg by mouth every morning.     metFORMIN  (GLUCOPHAGE ) 1000 MG tablet Take 1,000 mg by mouth daily with breakfast.     Current Facility-Administered Medications on File Prior to Visit  Medication Dose Route Frequency Provider Last Rate Last Admin   bcg vaccine injection 81 mg  3.24  mL Bladder Instillation Once         Allergies:  Allergies  Allergen Reactions   Ivp Dye [Iodinated Contrast Media]    Shellfish Allergy  Other (See Comments)    Reaction: unknown    Past Medical History:  Past Medical History:  Diagnosis Date   Atherosclerosis of native arteries of extremity with intermittent claudication (HCC)    Bladder mass    Chronic kidney disease, stage 3a (HCC)    COPD (chronic obstructive pulmonary disease) (HCC)    Diabetes mellitus without complication (HCC)    Gastrointestinal hemorrhage    GERD (gastroesophageal reflux disease)    Glaucoma    History of duodenal ulcer    HTN (hypertension)    Hypertension    Hypokalemia    Hypomagnesemia    Leukocytosis    Lung mass    Malignant neoplasm of lung (HCC) 06/28/2022   a.) stage IVA (cT4, cN3, cM1a) favor squamous cell carcinoma --> TPS 90%, TMB 10, KRAS G12V   Malnutrition (HCC)    Normocytic anemia    Pericardial effusion 05/2022   Pulmonary embolism (HCC)    Sepsis (HCC)    Tobacco abuse    Type II diabetes mellitus with renal manifestations (HCC)    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   a.) stage I (cT1, cN0, cM0)   Past Surgical History:  Past Surgical History:  Procedure Laterality Date   ABDOMINAL HYSTERECTOMY     BACK SURGERY     1988 and 1998   CYSTOSCOPY W/ RETROGRADES Bilateral 12/21/2022   Procedure: CYSTOSCOPY WITH RETROGRADE PYELOGRAM;  Surgeon: Geraline Knapp, MD;  Location: [facility]S;  Service: Urology;  Laterality: Bilateral;   CYSTOSCOPY WITH BIOPSY N/A 03/29/2023   Procedure: CYSTOSCOPY WITH BLADDER BIOPSY;  Surgeon: Geraline Knapp, MD;  Location: [facility];  Service: Urology;  Laterality: N/A;   CYSTOSCOPY WITH STENT PLACEMENT Right 03/29/2023   Procedure: CYSTOSCOPY WITH STENT PLACEMENT;  Surgeon: Geraline Knapp, MD;  Location: [facility];  Service: Urology;  Laterality: Right;   ESOPHAGOGASTRODUODENOSCOPY (EGD) WITH PROPOFOL  N/A 07/29/2022   Procedure: ESOPHAGOGASTRODUODENOSCOPY (EGD) WITH PROPOFOL ;  Surgeon: Luke Salaam, MD;  Location: [facility];  Service: Gastroenterology;  Laterality: N/A;    ESOPHAGOGASTRODUODENOSCOPY (EGD) WITH PROPOFOL  N/A 01/21/2023   Procedure: ESOPHAGOGASTRODUODENOSCOPY (EGD) WITH PROPOFOL ;  Surgeon: Luke Salaam, MD;  Location: [facility] ENDOSCOPY;  Service: Gastroenterology;  Laterality: N/A;   HOLMIUM LASER APPLICATION Right 03/29/2023   Procedure: HOLMIUM LASER ABLATION;  Surgeon: Geraline Knapp, MD;  Location: [facility];  Service: Urology;  Laterality: Right;   IR IMAGING GUIDED PORT INSERTION  01/10/2023   TRANSURETHRAL RESECTION OF BLADDER TUMOR N/A 12/21/2022   Procedure: TRANSURETHRAL RESECTION OF BLADDER TUMOR (TURBT);  Surgeon: Geraline Knapp, MD;  Location: [facility];  Service: Urology;  Laterality: N/A;   TRANSURETHRAL RESECTION OF BLADDER TUMOR N/A 03/29/2023   Procedure: TRANSURETHRAL RESECTION OF BLADDER TUMOR (TURBT);  Surgeon: Geraline Knapp, MD;  Location: [facility];  Service: Urology;  Laterality: N/A;   URETEROSCOPY Right 03/29/2023   Procedure: DIAGNOSTIC URETEROSCOPY;  Surgeon: Geraline Knapp, MD;  Location: [facility];  Service: Urology;  Laterality: Right;   Social History:  Social History   Socioeconomic History   Marital status: Widowed    Spouse name: Not on file   Number of children: Not on file   Years of education: Not on file   Highest education level: Not on file  Occupational History   Not on file  Tobacco Use   Smoking status: Every Day    Current packs/day: 0.25    Types: Cigarettes   Smokeless tobacco: Never  Vaping Use   Vaping status: Never Used  Substance and Sexual Activity   Alcohol use: No   Drug use: No   Sexual activity: Not on file  Other Topics Concern   Not on file  Social History Narrative   Not on file   Social Drivers of Health   Financial Resource Strain: Low Risk  (06/28/2022)   Overall Financial Resource Strain (CARDIA)    Difficulty of Paying Living Expenses: Not very hard  Food Insecurity: No Food Insecurity (12/22/2022)   Hunger Vital Sign    Worried About Running Out of Food in the Last  Year: Never true    Ran Out of Food in the Last Year: Never true  Transportation Needs: No Transportation Needs (12/22/2022)   PRAPARE - Administrator, Civil Service (Medical): No    Lack of Transportation (Non-Medical): No  Physical Activity: Not on file  Stress: No Stress Concern Present (06/28/2022)   [employer] of Occupational Health - Occupational Stress Questionnaire    Feeling of Stress : Not at all  Social Connections: Not on file  Intimate Partner Violence: Not At Risk (12/22/2022)   Humiliation, Afraid, Rape, and Kick questionnaire    Fear of Current or Ex-Partner: No    Emotionally Abused: No    Physically Abused: No    Sexually Abused: No   Family History:  Family History  Problem Relation Age of Onset   Asthma Mother    Heart disease Mother    Hypertension Mother    Diabetes Mother    Stroke Mother    Diabetes Sister    Diabetes Brother     Review of Systems: Constitutional: Doesn't report fevers, chills or abnormal weight loss Eyes: Doesn't report blurriness of vision Ears, nose, mouth, throat, and face: Doesn't report sore throat Respiratory: Doesn't report cough, dyspnea or wheezes Cardiovascular: Doesn't report palpitation, chest discomfort  Gastrointestinal:  Doesn't report nausea, constipation, diarrhea GU: Doesn't report incontinence Skin: Doesn't report skin rashes Neurological: Per HPI Musculoskeletal: Doesn't report joint pain Behavioral/Psych: Doesn't report anxiety  Physical Exam: Vitals:   10/14/23 1042  BP: (!) 153/76  Pulse: 97  Resp: 17  Temp: (!) 97 F (36.1 C)  SpO2: 100%    KPS: 80. General: Alert, cooperative, pleasant, in no acute distress Head: Normal EENT: No conjunctival injection or scleral icterus.  Lungs: Resp effort normal Cardiac: Regular rate Abdomen: Non-distended abdomen Skin: No rashes cyanosis or petechiae. Extremities: No clubbing or edema  Neurologic Exam: Mental Status: Awake, alert,  attentive to examiner. Oriented to self and environment. Language is fluent with intact comprehension.  Cranial Nerves: Visual acuity is grossly normal. Visual fields are full. Extra-ocular movements intact. No ptosis. Face is symmetric Motor: Tone and bulk are normal. Power is full in both arms and legs. Reflexes are symmetric, no pathologic reflexes present.  Sensory: Intact to light touch Gait: Normal.   Labs: I have reviewed the data as listed    Component Value Date/Time   NA 136 09/28/2023 0849   NA 134 (L) 01/16/2012 1722   K 3.3 (L) 09/28/2023 0849   K 3.1 (L) 01/16/2012 1722   CL 102 09/28/2023 0849   CL 97 (L) 01/16/2012 1722   CO2 25 09/28/2023 0849   CO2 27 01/16/2012 1722   GLUCOSE 158 (H) 09/28/2023 0849  GLUCOSE 198 (H) 01/16/2012 1722   BUN 15 09/28/2023 0849   BUN 13 01/16/2012 1722   CREATININE 0.97 09/28/2023 0849   CREATININE 0.99 01/16/2012 1722   CALCIUM  8.5 (L) 09/28/2023 0849   CALCIUM  9.2 01/16/2012 1722   PROT 6.7 09/28/2023 0849   PROT 8.1 01/16/2012 1722   ALBUMIN  3.4 (L) 09/28/2023 0849   ALBUMIN  2.9 (L) 01/16/2012 1722   AST 17 09/28/2023 0849   ALT 10 09/28/2023 0849   ALT 22 01/16/2012 1722   ALKPHOS 55 09/28/2023 0849   ALKPHOS 87 01/16/2012 1722   BILITOT 0.6 09/28/2023 0849   GFRNONAA 58 (L) 09/28/2023 0849   GFRNONAA 57 (L) 01/16/2012 1722   GFRAA 47 (L) 01/01/2016 0457   GFRAA >60 01/16/2012 1722   Lab Results  Component Value Date   WBC 7.3 09/28/2023   NEUTROABS 5.1 09/28/2023   HGB 10.8 (L) 09/28/2023   HCT 34.0 (L) 09/28/2023   MCV 88.8 09/28/2023   PLT 368 09/28/2023   Lipid Panel     Component Value Date/Time   CHOL 157 07/27/2023 0839   TRIG 67 07/27/2023 0839   HDL 64 07/27/2023 0839   CHOLHDL 2.5 07/27/2023 0839   VLDL 13 07/27/2023 0839   LDLCALC 80 07/27/2023 0839    Imaging:  MR BRAIN W WO CONTRAST Result Date: 09/26/2023 CLINICAL DATA:  Stroke, follow up; Brain/CNS neoplasm, assess treatment response  EXAM: MRI HEAD WITHOUT CONTRAST MRA HEAD WITHOUT CONTRAST MRA NECK WITHOUT AND WITH CONTRAST TECHNIQUE: Multiplanar, multi-echo pulse sequences of the brain and surrounding structures were acquired without intravenous contrast. Angiographic images of the Circle of Willis were acquired using MRA technique without intravenous contrast. Angiographic images of the neck were acquired using MRA technique without and with intravenous contrast. Carotid stenosis measurements (when applicable) are obtained utilizing NASCET criteria, using the distal internal carotid diameter as the denominator. CONTRAST:  5mL GADAVIST  GADOBUTROL  1 MMOL/ML IV SOLN COMPARISON:  MRI head June 22, 2023. FINDINGS: MRI HEAD FINDINGS Brain: No acute infarction, hemorrhage, hydrocephalus, extra-axial collection or mass lesion. No pathologic intracranial enhancement. Remote perforator infarcts in the left basal ganglia and right thalamus. Moderate patchy T2/FLAIR hyperintensity in the white matter, nonspecific but compatible with chronic microvascular ischemic disease. Vascular: See below. Skull and upper cervical spine: Normal marrow signal. Sinuses/Orbits: Clear sinuses.  No acute orbital findings. Other: No mastoid effusions. MRA HEAD FINDINGS Anterior circulation: Bilateral intracranial ICAs, MCAs, and ACAs are patent without proximal hemodynamically significant stenosis. Posterior circulation: Bilateral intradural vertebral arteries, basilar artery, and bilateral posterior rods are patent without proximal hemodynamically significant stenosis. Left fetal type PCA, anatomic variant. MRA NECK FINDINGS Aortic arch: Great vessel origins are patent. Right carotid system: Atherosclerosis at the carotid bifurcation with severe stenosis of the ICA origin. Left carotid system: Patent without significant stenosis. Vertebral arteries: Patent bilaterally without significant stenosis. Left dominant. IMPRESSION: 1. 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 A CTA of the neck could better quantify if clinically warranted. 2. No evidence of acute intracranial abnormality. 3. No large vessel occlusion or proximal hemodynamically significant stenosis intracranially. Electronically Signed   By: Stevenson Elbe M.D.   On: 09/26/2023 20:29   MR ANGIO NECK W WO CONTRAST Result Date: 09/26/2023 CLINICAL DATA:  Stroke, follow up; Brain/CNS neoplasm, assess treatment response EXAM: MRI HEAD WITHOUT CONTRAST MRA HEAD WITHOUT CONTRAST MRA NECK WITHOUT AND WITH CONTRAST TECHNIQUE: Multiplanar, multi-echo pulse sequences of the brain and surrounding structures were acquired without intravenous  contrast. Angiographic images of the Circle of Willis were acquired using MRA technique without intravenous contrast. Angiographic images of the neck were acquired using MRA technique without and with intravenous contrast. Carotid stenosis measurements (when applicable) are obtained utilizing NASCET criteria, using the distal internal carotid diameter as the denominator. CONTRAST:  5mL GADAVIST  GADOBUTROL  1 MMOL/ML IV SOLN COMPARISON:  MRI head June 22, 2023. FINDINGS: MRI HEAD FINDINGS Brain: No acute infarction, hemorrhage, hydrocephalus, extra-axial collection or mass lesion. No pathologic intracranial enhancement. Remote perforator infarcts in the left basal ganglia and right thalamus. Moderate patchy T2/FLAIR hyperintensity in the white matter, nonspecific but compatible with chronic microvascular ischemic disease. Vascular: See below. Skull and upper cervical spine: Normal marrow signal. Sinuses/Orbits: Clear sinuses.  No acute orbital findings. Other: No mastoid effusions. MRA HEAD FINDINGS Anterior circulation: Bilateral intracranial ICAs, MCAs, and ACAs are patent without proximal hemodynamically significant stenosis. Posterior circulation: Bilateral intradural vertebral arteries, basilar artery, and bilateral posterior rods are patent without proximal hemodynamically  significant stenosis. Left fetal type PCA, anatomic variant. MRA NECK FINDINGS Aortic arch: Great vessel origins are patent. Right carotid system: Atherosclerosis at the carotid bifurcation with severe stenosis of the ICA origin. Left carotid system: Patent without significant stenosis. Vertebral arteries: Patent bilaterally without significant stenosis. Left dominant. IMPRESSION: 1. 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 A CTA of the neck could better quantify if clinically warranted. 2. No evidence of acute intracranial abnormality. 3. No large vessel occlusion or proximal hemodynamically significant stenosis intracranially. Electronically Signed   By: Stevenson Elbe M.D.   On: 09/26/2023 20:29   MR ANGIO HEAD WO CONTRAST Result Date: 09/26/2023 CLINICAL DATA:  Stroke, follow up; Brain/CNS neoplasm, assess treatment response EXAM: MRI HEAD WITHOUT CONTRAST MRA HEAD WITHOUT CONTRAST MRA NECK WITHOUT AND WITH CONTRAST TECHNIQUE: Multiplanar, multi-echo pulse sequences of the brain and surrounding structures were acquired without intravenous contrast. Angiographic images of the Circle of Willis were acquired using MRA technique without intravenous contrast. Angiographic images of the neck were acquired using MRA technique without and with intravenous contrast. Carotid stenosis measurements (when applicable) are obtained utilizing NASCET criteria, using the distal internal carotid diameter as the denominator. CONTRAST:  5mL GADAVIST  GADOBUTROL  1 MMOL/ML IV SOLN COMPARISON:  MRI head June 22, 2023. FINDINGS: MRI HEAD FINDINGS Brain: No acute infarction, hemorrhage, hydrocephalus, extra-axial collection or mass lesion. No pathologic intracranial enhancement. Remote perforator infarcts in the left basal ganglia and right thalamus. Moderate patchy T2/FLAIR hyperintensity in the white matter, nonspecific but compatible with chronic microvascular ischemic disease. Vascular: See below. Skull and upper  cervical spine: Normal marrow signal. Sinuses/Orbits: Clear sinuses.  No acute orbital findings. Other: No mastoid effusions. MRA HEAD FINDINGS Anterior circulation: Bilateral intracranial ICAs, MCAs, and ACAs are patent without proximal hemodynamically significant stenosis. Posterior circulation: Bilateral intradural vertebral arteries, basilar artery, and bilateral posterior rods are patent without proximal hemodynamically significant stenosis. Left fetal type PCA, anatomic variant. MRA NECK FINDINGS Aortic arch: Great vessel origins are patent. Right carotid system: Atherosclerosis at the carotid bifurcation with severe stenosis of the ICA origin. Left carotid system: Patent without significant stenosis. Vertebral arteries: Patent bilaterally without significant stenosis. Left dominant. IMPRESSION: 1. 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 A CTA of the neck could better quantify if clinically warranted. 2. No evidence of acute intracranial abnormality. 3. No large vessel occlusion or proximal hemodynamically significant stenosis intracranially. Electronically Signed   By: Stevenson Elbe M.D.   On: 09/26/2023  20:29   CT CHEST ABDOMEN PELVIS WO CONTRAST Result Date: 09/21/2023 EXAMINATION: CT CHEST ABDOMEN PELVIS WO CONTRAST CLINICAL INDICATION: Female, 83 years old. lung cancer TECHNIQUE: Helical CT scan examination of the chest, abdomen, and pelvis is performed from the domes of the diaphragm to the pubic symphysis. Unless otherwise specified, incidental thyroid , adrenal, renal lesions do not require dedicated imaging follow up. Additionally, any mentioned pulmonary nodules do not require dedicated imaging follow-up based on the Fleischner guidelines unless otherwise specified. Coronary calcifications are not identified unless otherwise specified. COMPARISON: 05/11/2023 FINDINGS: CHEST: Right chest wall Mediport catheter tip terminates in the SVC. The thyroid  appears normal. The thoracic aorta  is not aneurysmal. Scattered calcified atherosclerotic changes are present. The main pulmonary artery is borderline enlarged. The heart is normal in size. There are coronary calcifications. There is similar matted lymphadenopathy in the prevascular space measuring up to 10 mm in thickness (series 2 image 20). The trachea and mainstem bronchi appear patent. There are similar subtle groundglass bilateral pulmonary nodules for instance in the right upper lobe there is an unchanged 9 mm nodule (series 2 image 15). No new or enlarging pulmonary nodules are appreciated. ABDOMEN/PELVIS: The liver contains scattered cysts. There is cholelithiasis. The spleen is normal. Pancreas is normal. Adrenals are normal. Small nonobstructing left intrarenal stone noted. Right ureteral stent is seen. The abdominal aorta is normal in caliber. Scattered calcified atherosclerotic changes are present. The bladder is normal. The uterus is surgically absent. Large and small bowel loops are otherwise within normal limits. No free fluid or adenopathy. BONES: There are degenerative changes of the spine. Lumbar spinal fusion noted. There are degenerative changes of the bony pelvis. IMPRESSION: Similar matted prevascular mediastinal adenopathy. Similar scattered groundglass bilateral pulmonary nodules No evidence for metastatic disease within the abdomen or pelvis. DOSE REDUCTION: All CT scans are performed using radiation dose reduction techniques, when applicable. Technical factors are evaluated and adjusted to ensure appropriate moderation of exposure. 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  Based on recent workup, vascular imaging, etiology is likely large vessel disease with distal thromboembolism.   There is marked narrowing of the carotid inferior to the bifurcation.  Fortunately she remains asymptomatic.  She is not a good surgical candidate based on her cancer history, age.  Will manage with medications at this time.  She should con't ASA 81mg  daily; will defer statin given recent lipid panel, LDL of 80.  We will plan to recheck lipid panel in several months.  Counseled on lifestyle, diet and exercise, smoking cessation.  She is actively smoking.  No further workup or imaging in absence of symptoms, aside from repeat lipid panel.  She is agreeable with this plan.    We appreciate the opportunity to participate in the care of Alejandro L Crouse.   We ask that Shaundra L Huang return to clinic in 6 months, or sooner as needed.  All questions were answered. The patient knows to call the clinic with any problems, questions or concerns. No barriers to learning were detected.  The total time spent in the encounter was 40 minutes and more than 50% was on counseling and review of test results   Mamie Searles, MD Medical Director of [facility] [place] 10/14/23 10:38 AM

## 2023-10-14 NOTE — Progress Notes (Signed)
 No new concerns today

## 2023-10-19 ENCOUNTER — Inpatient Hospital Stay

## 2023-10-19 ENCOUNTER — Encounter: Payer: Self-pay | Admitting: Oncology

## 2023-10-19 ENCOUNTER — Inpatient Hospital Stay (HOSPITAL_BASED_OUTPATIENT_CLINIC_OR_DEPARTMENT_OTHER): Admitting: Oncology

## 2023-10-19 VITALS — BP 139/74 | HR 87 | Resp 16

## 2023-10-19 VITALS — BP 149/72 | HR 99 | Temp 97.4°F | Resp 18 | Wt 109.4 lb

## 2023-10-19 DIAGNOSIS — I63231 Cerebral infarction due to unspecified occlusion or stenosis of right carotid arteries: Secondary | ICD-10-CM

## 2023-10-19 DIAGNOSIS — R634 Abnormal weight loss: Secondary | ICD-10-CM

## 2023-10-19 DIAGNOSIS — D649 Anemia, unspecified: Secondary | ICD-10-CM

## 2023-10-19 DIAGNOSIS — E876 Hypokalemia: Secondary | ICD-10-CM

## 2023-10-19 DIAGNOSIS — Z5112 Encounter for antineoplastic immunotherapy: Secondary | ICD-10-CM | POA: Diagnosis not present

## 2023-10-19 DIAGNOSIS — C3482 Malignant neoplasm of overlapping sites of left bronchus and lung: Secondary | ICD-10-CM

## 2023-10-19 DIAGNOSIS — C679 Malignant neoplasm of bladder, unspecified: Secondary | ICD-10-CM

## 2023-10-19 LAB — CBC WITH DIFFERENTIAL (CANCER CENTER ONLY)
Abs Immature Granulocytes: 0.01 10*3/uL (ref 0.00–0.07)
Basophils Absolute: 0 10*3/uL (ref 0.0–0.1)
Basophils Relative: 0 %
Eosinophils Absolute: 0.3 10*3/uL (ref 0.0–0.5)
Eosinophils Relative: 6 %
HCT: 32.2 % — ABNORMAL LOW (ref 36.0–46.0)
Hemoglobin: 10.4 g/dL — ABNORMAL LOW (ref 12.0–15.0)
Immature Granulocytes: 0 %
Lymphocytes Relative: 28 %
Lymphs Abs: 1.6 10*3/uL (ref 0.7–4.0)
MCH: 28.6 pg (ref 26.0–34.0)
MCHC: 32.3 g/dL (ref 30.0–36.0)
MCV: 88.5 fL (ref 80.0–100.0)
Monocytes Absolute: 0.4 10*3/uL (ref 0.1–1.0)
Monocytes Relative: 8 %
Neutro Abs: 3.3 10*3/uL (ref 1.7–7.7)
Neutrophils Relative %: 58 %
Platelet Count: 252 10*3/uL (ref 150–400)
RBC: 3.64 MIL/uL — ABNORMAL LOW (ref 3.87–5.11)
RDW: 16.6 % — ABNORMAL HIGH (ref 11.5–15.5)
WBC Count: 5.6 10*3/uL (ref 4.0–10.5)
nRBC: 0 % (ref 0.0–0.2)

## 2023-10-19 LAB — CMP (CANCER CENTER ONLY)
ALT: 13 U/L (ref 0–44)
AST: 23 U/L (ref 15–41)
Albumin: 3.7 g/dL (ref 3.5–5.0)
Alkaline Phosphatase: 56 U/L (ref 38–126)
Anion gap: 9 (ref 5–15)
BUN: 20 mg/dL (ref 8–23)
CO2: 27 mmol/L (ref 22–32)
Calcium: 9.1 mg/dL (ref 8.9–10.3)
Chloride: 104 mmol/L (ref 98–111)
Creatinine: 0.95 mg/dL (ref 0.44–1.00)
GFR, Estimated: 60 mL/min — ABNORMAL LOW (ref >60)
Glucose, Bld: 175 mg/dL — ABNORMAL HIGH (ref 70–99)
Potassium: 3.6 mmol/L (ref 3.5–5.1)
Sodium: 140 mmol/L (ref 135–145)
Total Bilirubin: 0.4 mg/dL (ref 0.0–1.2)
Total Protein: 7.1 g/dL (ref 6.5–8.1)

## 2023-10-19 MED ORDER — SODIUM CHLORIDE 0.9 % IV SOLN
200.0000 mg | Freq: Once | INTRAVENOUS | Status: AC
  Administered 2023-10-19: 200 mg via INTRAVENOUS
  Filled 2023-10-19: qty 8

## 2023-10-19 MED ORDER — HEPARIN SOD (PORK) LOCK FLUSH 100 UNIT/ML IV SOLN
500.0000 [IU] | Freq: Once | INTRAVENOUS | Status: AC | PRN
  Administered 2023-10-19: 500 [IU]
  Filled 2023-10-19: qty 5

## 2023-10-19 MED ORDER — SODIUM CHLORIDE 0.9 % IV SOLN
Freq: Once | INTRAVENOUS | Status: AC
  Filled 2023-10-19: qty 250

## 2023-10-19 NOTE — Assessment & Plan Note (Signed)
 Chronic anemia.  Hemoglobin is stable

## 2023-10-19 NOTE — Assessment & Plan Note (Signed)
 Immunotherapy treatment plan as listed above.

## 2023-10-19 NOTE — Patient Instructions (Signed)
 [facility] - A DEPT OF [facility]  Discharge Instructions: Thank you for choosing [facility] to provide your oncology and hematology care.  If you have a lab appointment with the Cancer Center, please go directly to the Cancer Center and check in at the registration area.  Wear comfortable clothing and clothing appropriate for easy access to any Portacath or PICC line.   We strive to give you quality time with your provider. You may need to reschedule your appointment if you arrive late (15 or more minutes).  Arriving late affects you and other patients whose appointments are after yours.  Also, if you miss three or more appointments without notifying the office, you may be dismissed from the clinic at the provider's discretion.      For prescription refill requests, have your pharmacy contact our office and allow 72 hours for refills to be completed.    Today you received the following chemotherapy and/or immunotherapy agents: pembrolizumab    To help prevent nausea and vomiting after your treatment, we encourage you to take your nausea medication as directed.  BELOW ARE SYMPTOMS THAT SHOULD BE REPORTED IMMEDIATELY: *FEVER GREATER THAN 100.4 F (38 C) OR HIGHER *CHILLS OR SWEATING *NAUSEA AND VOMITING THAT IS NOT CONTROLLED WITH YOUR NAUSEA MEDICATION *UNUSUAL SHORTNESS OF BREATH *UNUSUAL BRUISING OR BLEEDING *URINARY PROBLEMS (pain or burning when urinating, or frequent urination) *BOWEL PROBLEMS (unusual diarrhea, constipation, pain near the anus) TENDERNESS IN MOUTH AND THROAT WITH OR WITHOUT PRESENCE OF ULCERS (sore throat, sores in mouth, or a toothache) UNUSUAL RASH, SWELLING OR PAIN  UNUSUAL VAGINAL DISCHARGE OR ITCHING   Items with * indicate a potential emergency and should be followed up as soon as possible or go to the Emergency Department if any problems should occur.  Please show the CHEMOTHERAPY ALERT CARD or IMMUNOTHERAPY  ALERT CARD at check-in to the Emergency Department and triage nurse.  Should you have questions after your visit or need to cancel or reschedule your appointment, please contact [facility] - A DEPT OF [facility]  [phone] and follow the prompts.  Office hours are 8:00 a.m. to 4:30 p.m. Monday - Friday. Please note that voicemails left after 4:00 p.m. may not be returned until the following business day.  We are closed weekends and major holidays. You have access to a nurse at all times for urgent questions. Please call the main number to the clinic [phone] and follow the prompts.  For any non-urgent questions, you may also contact your provider using MyChart. We now offer e-Visits for anyone 38 and older to request care online for non-urgent symptoms. For details visit [email or web address].   Also download the MyChart app! Go to the app store, search "MyChart", open the app, select [place], and log in with your MyChart username and password.

## 2023-10-19 NOTE — Assessment & Plan Note (Signed)
 Continue nutrition supplement.

## 2023-10-19 NOTE — Progress Notes (Signed)
 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 S/p TURBT, T1 lesion, high grade.  03/29/2023 Non-muscle invasive bladder cancer. S/p TURBT Patient is s/p intravesical BCG treatments-she is off  suppressive antibiotics Follow-up with urology  Malignant neoplasm of lung (HCC) Non-small cell lung cancer, favor squamous cell carcinoma, stage IV- TPS 90%, TMB 10, KRAS G12V MRI brain with and without contrast -  Left occipital focus of prior hemorrhage with mild linear contrast enhancement. Possible small metastasis or microhemmorhagic event.  TPS 90%, CT in May 2025 showed stable disease Labs are reviewed and discussed with patient.  Proceed with Keytruda   Weight loss Continue nutrition supplement  Normocytic anemia Chronic anemia.  Hemoglobin is stable   Hypokalemia Continue KCL 10meq daily   Encounter for antineoplastic immunotherapy Immunotherapy treatment plan as listed above.  CVA (cerebral vascular accident) (HCC) History of small acute infarcts on MRI brain.  She is asymptomatic, stenosis of the right ICA  Follow up with neurology Dr. Buckley       Orders Placed This Encounter  Procedures   CBC with Differential (Cancer Center Only)    Standing Status:   Future    Expected Date:   11/30/2023    Expiration Date:   11/29/2024   CMP (Cancer Center only)    Standing Status:   Future    Expected Date:   11/30/2023    Expiration  Date:   11/29/2024   Follow up in 3 weeks.   All questions were answered. The patient knows to call the clinic with any problems, questions or concerns.  Call Babara, MD, PhD [facility] Hematology Oncology 10/19/2023       HISTORY OF PRESENTING ILLNESS:  Regina Perkins 83 y.o. 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 I have reviewed her chart and materials related to her cancer extensively and collaborated history with the patient. Summary of oncologic history is as follows: Oncology History  Malignant neoplasm of lung (HCC)  05/24/2022 Imaging   CT chest angiogram showed 1. No evidence for pulmonary embolism. 2. Left upper lobe/prevascular mass worrisome for neoplasm. 3. Mediastinal and hilar lymphadenopathy. 4. Multiple pulmonary nodules measuring up to 11 mm worrisome for metastatic disease. 5. Small left pleural effusion. 6. Patchy ground-glass and airspace opacities in the left upper lobe worrisome for infection. 7. Moderate-sized pericardial effusion. 8. Cholelithiasis. 9. Nonobstructing left renal calculus   05/25/2022 Imaging   CT abdomen pelvis wo contrast 1. No acute findings or explanation for the patient's symptoms. 2. No evidence of primary malignancy or metastatic disease within the abdomen or pelvis on noncontrast imaging.  3. Cholelithiasis without evidence of cholecystitis or biliary dilatation. 4. Nonobstructing left renal calculus. 5. Moderate stool throughout the colon suggesting constipation. 6.  Aortic Atherosclerosis    05/26/2022 Initial Diagnosis   Malignant neoplasm of lung - TPS 90%, TMB 10, KRAS G12V  -05/22/2022 in the emergency  room, she was found to have hypoxia with oxygen levels of 86% on room air, improved to 94 with 2 L of oxygen.  D-dimer was elevated at 1.29, troponin negative.  Lower extremity Doppler was negative for DVT.  VQ scan high  probability PE (Large segmental perfusion defect of the left upper lobe without corresponding radiographic abnormality. Additional bilateral wedge-shaped subsegmental branch perfusion defects .  Patient was admitted and started on heparin  Echocardiogram showed no RV strain.  Moderate pericardial effusion. Over her hospitalization, she continues to have shortness of breath with minimal exertion. 05/24/2022, CT chest angiogram showed left lung mass with bilateral lung nodules.   06/16/2022 Imaging   PET scan  1. 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 No evidence of metastatic disease in the abdomen or pelvis. 2. Moderate pericardial effusion. 3. Tiny left pleural effusion. 4. Left renal stone. 5. Aortic atherosclerosis (ICD10-I70.0). Coronary artery calcification. 6. 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    06/28/2022 Cancer Staging   Staging form: Lung, AJCC 8th Edition - Clinical: Stage IVA (cT4, cN3, cM1a) - Signed by Babara Call, MD on 06/28/2022   07/14/2022 -  Chemotherapy   Patient is on Treatment Plan : LUNG NSCLC Pembrolizumab  (200) q21d     09/29/2022 Imaging   CT chest abdomen pelvis w contrast showed Bilateral pulmonary nodules, some of which are waxing/waning, but favoring multifocal lung carcinoma/metastases.   Improving thoracic nodal metastases, as above.  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 Consider cystoscopy as clinically warranted.  Aortic Atherosclerosis (ICD10-I70.0) and Emphysema  (ICD10-J43.9)   01/06/2023 Imaging   CT chest abdomen pelvis w contrast showed 1. Interval decrease in size of multiple spiculated and subsolid pulmonary nodules, consistent with treatment response. 2. No significant change in matted post treatment prevascular and left superior mediastinal soft tissue. 3. Diminished size of an endoluminal bladder mass seen on prior examination, soft tissue in this vicinity now measuring no greater than 2.1 x 1.4 cm, previously at least 4.7 x 2.1 cm. Presumably this has been at least partially resected. 4. No evidence of lymphadenopathy or metastatic disease in the abdomen or pelvis. 5. Cholelithiasis. 6. Nonobstructive left nephrolithiasis. 7. Coronary artery disease.   01/10/2023 Procedure   Medi port placed by IR   05/11/2023 Imaging   CT chest abdomen pelvis w contrast showed 1. Unchanged matted, treated prevascular and left superior mediastinal soft tissue. 2. Multiple bilateral spiculated and ground-glass pulmonary nodules not significantly changed. 3. No residual mass of the posterior right aspect of the urinary bladder. Mild residual thickening in this vicinity. Interval placement of right-sided double-J ureteral stent catheter. 4. No evidence of lymphadenopathy or metastatic disease in the abdomen or pelvis. 5. Emphysema and diffuse bilateral bronchial wall thickening. 6. Coronary artery disease.   Aortic Atherosclerosis (ICD10-I70.0) and Emphysema (ICD10-J43.9).    Urothelial carcinoma of bladder (HCC)  10/06/2022 Initial Diagnosis   Urothelial carcinoma of bladder S/p TURBT  1. 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 Bladder, biopsy, Base of tumor  HIGH GRADE PAPILLARY UROTHELIAL CARCINOMA SUBMUCOSA AND MUSCULARIS PROPRIA IS  PRESENT AND NOT INVOLVED BY CARCINOMA  Postprocedure, patient developed gross hematuria and was admitted to the hospital.  She received PRBC transfusion.  Hemoglobin improved and she was discharged.    12/21/2022 Cancer Staging   Staging form: Urinary Bladder, AJCC 8th Edition - Clinical stage from 12/21/2022: Stage I (cT1, cN0, cM0) - Signed by Babara Call, MD on 12/29/2022 WHO/ISUP grade (low/high): High Grade Histologic grading system: 2 grade system     INTERVAL HISTORY Regina Perkins is a 83 y.o. female who has above history reviewed by me today presents for follow up visit for Lung squamous cell carcinoma She tolerates [place] well. Denies skin rash, diarrhea.  Patient has no new complaints.  Denies chest pain, chest palpitation. She denies headache, focal weakness or slurred speech.    MEDICAL HISTORY:  Past Medical History:  Diagnosis Date   Atherosclerosis of native arteries of extremity with intermittent claudication (HCC)    Bladder mass    Chronic kidney disease, stage 3a (HCC)    COPD (chronic obstructive pulmonary disease) (HCC)    Diabetes mellitus without complication (HCC)    Gastrointestinal hemorrhage    GERD (gastroesophageal reflux disease)    Glaucoma    History of duodenal ulcer    HTN (hypertension)    Hypertension    Hypokalemia    Hypomagnesemia    Leukocytosis    Lung mass    Malignant neoplasm of lung (HCC) 06/28/2022   a.) stage IVA (cT4, cN3, cM1a) favor squamous cell carcinoma --> TPS 90%, TMB 10, KRAS G12V   Malnutrition (HCC)    Normocytic anemia    Pericardial effusion 05/2022   Pulmonary embolism (HCC)    Sepsis (HCC)    Tobacco abuse    Type II diabetes mellitus with renal manifestations (HCC)    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   a.) stage I (cT1, cN0, cM0)    SURGICAL HISTORY: Past Surgical History:  Procedure Laterality Date   ABDOMINAL HYSTERECTOMY     BACK SURGERY     1988 and 1998   CYSTOSCOPY W/ RETROGRADES  Bilateral 12/21/2022   Procedure: CYSTOSCOPY WITH RETROGRADE PYELOGRAM;  Surgeon: Twylla Glendia BROCKS, MD;  Location: [facility] ORS;  Service: Urology;  Laterality: Bilateral;   CYSTOSCOPY WITH BIOPSY N/A 03/29/2023   Procedure: CYSTOSCOPY WITH BLADDER BIOPSY;  Surgeon: Twylla Glendia BROCKS, MD;  Location: [facility];  Service: Urology;  Laterality: N/A;   CYSTOSCOPY WITH STENT PLACEMENT Right 03/29/2023   Procedure: CYSTOSCOPY WITH STENT PLACEMENT;  Surgeon: Twylla Glendia BROCKS, MD;  Location: [facility];  Service: Urology;  Laterality: Right;   ESOPHAGOGASTRODUODENOSCOPY (EGD) WITH PROPOFOL  N/A 07/29/2022   Procedure: ESOPHAGOGASTRODUODENOSCOPY (EGD) WITH PROPOFOL ;  Surgeon: Therisa Bi, MD;  Location: [facility] ENDOSCOPY;  Service: Gastroenterology;  Laterality: N/A;   ESOPHAGOGASTRODUODENOSCOPY (EGD) WITH PROPOFOL  N/A 01/21/2023   Procedure: ESOPHAGOGASTRODUODENOSCOPY (EGD) WITH PROPOFOL ;  Surgeon: Therisa Bi, MD;  Location: [facility] ENDOSCOPY;  Service: Gastroenterology;  Laterality: N/A;   HOLMIUM LASER APPLICATION Right 03/29/2023   Procedure: HOLMIUM LASER ABLATION;  Surgeon: Twylla Glendia BROCKS, MD;  Location: [facility] ORS;  Service: Urology;  Laterality: Right;   IR IMAGING GUIDED PORT INSERTION  01/10/2023   TRANSURETHRAL RESECTION OF BLADDER TUMOR N/A 12/21/2022   Procedure: TRANSURETHRAL RESECTION OF BLADDER TUMOR (TURBT);  Surgeon: Twylla Glendia BROCKS, MD;  Location: [facility] ORS;  Service: Urology;  Laterality: N/A;   TRANSURETHRAL RESECTION OF BLADDER TUMOR N/A 03/29/2023   Procedure:  TRANSURETHRAL RESECTION OF BLADDER TUMOR (TURBT);  Surgeon: Twylla Glendia BROCKS, MD;  Location: [facility] ORS;  Service: Urology;  Laterality: N/A;   URETEROSCOPY Right 03/29/2023   Procedure: DIAGNOSTIC URETEROSCOPY;  Surgeon: Twylla Glendia BROCKS, MD;  Location: [facility] ORS;  Service: Urology;  Laterality: Right;    SOCIAL HISTORY: Social History   Socioeconomic History   Marital status: Widowed    Spouse name: Not on file   Number of children: Not on file    Years of education: Not on file   Highest education level: Not on file  Occupational History   Not on file  Tobacco Use   Smoking status: Every Day    Current packs/day: 0.25    Types: Cigarettes   Smokeless tobacco: Never  Vaping Use   Vaping status: Never Used  Substance and Sexual Activity   Alcohol use: No   Drug use: No   Sexual activity: Not on file  Other Topics Concern   Not on file  Social History Narrative   Not on file   Social Drivers of Health   Financial Resource Strain: Low Risk  (06/28/2022)   Overall Financial Resource Strain (CARDIA)    Difficulty of Paying Living Expenses: Not very hard  Food Insecurity: No Food Insecurity (12/22/2022)   Hunger Vital Sign    Worried About Running Out of Food in the Last Year: Never true    Ran Out of Food in the Last Year: Never true  Transportation Needs: No Transportation Needs (12/22/2022)   PRAPARE - Administrator, Civil Service (Medical): No    Lack of Transportation (Non-Medical): No  Physical Activity: Not on file  Stress: No Stress Concern Present (06/28/2022)   [employer] of Occupational Health - Occupational Stress Questionnaire    Feeling of Stress : Not at all  Social Connections: Not on file  Intimate Partner Violence: Not At Risk (12/22/2022)   Humiliation, Afraid, Rape, and Kick questionnaire    Fear of Current or Ex-Partner: No    Emotionally Abused: No    Physically Abused: No    Sexually Abused: No    FAMILY HISTORY: Family History  Problem Relation Age of Onset   Asthma Mother    Heart disease Mother    Hypertension Mother    Diabetes Mother    Stroke Mother    Diabetes Sister    Diabetes Brother     ALLERGIES:  is allergic to ivp dye [iodinated contrast media] and shellfish allergy.  MEDICATIONS:  Current Outpatient Medications  Medication Sig Dispense Refill   albuterol  (VENTOLIN  HFA) 108 (90 Base) MCG/ACT inhaler Inhale 2 puffs into the lungs every 4 (four) hours as  needed for wheezing or shortness of breath.     amLODipine  (NORVASC ) 10 MG tablet Take 10 mg by mouth every morning.     aspirin  EC 81 MG tablet Take 1 tablet (81 mg total) by mouth daily. Swallow whole. 30 tablet 12   cyanocobalamin  (VITAMIN B12) 1000 MCG tablet Take 1 tablet (1,000 mcg total) by mouth daily. (Patient not taking: Reported on 10/14/2023) 90 tablet 0   lidocaine -prilocaine  (EMLA ) cream Apply 1 Application topically as needed. Apply to port and cover with saran wrap 1-2 hours prior to port access 30 g 1   lisinopril  (ZESTRIL ) 40 MG tablet Take 40 mg by mouth every morning.     metFORMIN  (GLUCOPHAGE ) 1000 MG tablet Take 1,000 mg by mouth daily with breakfast.     Current Facility-Administered Medications  Medication Dose Route Frequency Provider Last Rate Last Admin   bcg vaccine injection 81 mg  3.24 mL Bladder Instillation Once        Facility-Administered Medications Ordered in Other Visits  Medication Dose Route Frequency Provider Last Rate Last Admin   0.9 %  sodium chloride  infusion   Intravenous Once Dasja Brase, MD       heparin  lock flush 100 unit/mL  500 Units Intracatheter Once PRN Crystall Donaldson, MD       pembrolizumab  (KEYTRUDA ) 200 mg in sodium chloride  0.9 % 50 mL chemo infusion  200 mg Intravenous Once Babara Call, MD        Review of Systems  Constitutional:  Negative for appetite change, chills, fatigue and fever.  HENT:   Negative for hearing loss and voice change.   Eyes:  Negative for eye problems.  Respiratory:  Negative for chest tightness, cough and shortness of breath.   Cardiovascular:  Negative for chest pain.  Gastrointestinal:  Negative for abdominal distention, abdominal pain and blood in stool.  Endocrine: Negative for hot flashes.  Genitourinary:  Negative for difficulty urinating and frequency.   Musculoskeletal:  Negative for arthralgias.  Skin:  Negative for itching and rash.  Neurological:  Negative for extremity weakness.  Hematological:  Negative  for adenopathy.  Psychiatric/Behavioral:  Negative for confusion.      PHYSICAL EXAMINATION: ECOG PERFORMANCE STATUS: 2 - Symptomatic, <50% confined to bed  Vitals:   10/19/23 0840 10/19/23 0850  BP: (!) 158/81 (!) 149/72  Pulse: 99   Resp: 18   Temp: (!) 97.4 F (36.3 C)   SpO2: 100%     Filed Weights   10/19/23 0840  Weight: 109 lb 6.4 oz (49.6 kg)      Physical Exam Constitutional:      General: She is not in acute distress.    Appearance: She is not diaphoretic.  HENT:     Head: Normocephalic.   Eyes:     General: No scleral icterus.    Pupils: Pupils are equal, round, and reactive to light.    Cardiovascular:     Rate and Rhythm: Tachycardia present.     Heart sounds: Murmur heard.  Pulmonary:     Effort: Pulmonary effort is normal. No respiratory distress.     Comments: Decreased breath sounds bilaterally Abdominal:     General: There is no distension.   Musculoskeletal:        General: Normal range of motion.     Cervical back: Normal range of motion.   Skin:    Findings: No erythema.   Neurological:     Mental Status: She is alert and oriented to person, place, and time. Mental status is at baseline.     Cranial Nerves: No cranial nerve deficit.     Motor: No abnormal muscle tone.   Psychiatric:        Mood and Affect: Affect normal.      LABORATORY DATA:  I have reviewed the data as listed    Latest Ref Rng & Units 10/19/2023    8:32 AM 09/28/2023    8:49 AM 09/07/2023    8:57 AM  CBC  WBC 4.0 - 10.5 K/uL 5.6  7.3  7.0   Hemoglobin 12.0 - 15.0 g/dL 89.5  89.1  89.1   Hematocrit 36.0 - 46.0 % 32.2  34.0  34.0   Platelets 150 - 400 K/uL 252  368  308       Latest  Ref Rng & Units 10/19/2023    8:32 AM 09/28/2023    8:49 AM 09/07/2023    8:57 AM  CMP  Glucose 70 - 99 mg/dL 824  841  95   BUN 8 - 23 mg/dL 20  15  16    Creatinine 0.44 - 1.00 mg/dL 9.04  9.02  8.87   Sodium 135 - 145 mmol/L 140  136  137   Potassium 3.5 - 5.1 mmol/L 3.6   3.3  3.5   Chloride 98 - 111 mmol/L 104  102  102   CO2 22 - 32 mmol/L 27  25  25    Calcium  8.9 - 10.3 mg/dL 9.1  8.5  9.1   Total Protein 6.5 - 8.1 g/dL 7.1  6.7  7.2   Total Bilirubin 0.0 - 1.2 mg/dL 0.4  0.6  0.3   Alkaline Phos 38 - 126 U/L 56  55  60   AST 15 - 41 U/L 23  17  20    ALT 0 - 44 U/L 13  10  13       RADIOGRAPHIC STUDIES: I have personally reviewed the radiological images as listed and agreed with the findings in the report. MR BRAIN W WO CONTRAST Result Date: 09/26/2023 CLINICAL DATA:  Stroke, follow up; Brain/CNS neoplasm, assess treatment response EXAM: MRI HEAD WITHOUT CONTRAST MRA HEAD WITHOUT CONTRAST MRA NECK WITHOUT AND WITH CONTRAST TECHNIQUE: Multiplanar, multi-echo pulse sequences of the brain and surrounding structures were acquired without intravenous contrast. Angiographic images of the Circle of Willis were acquired using MRA technique without intravenous contrast. Angiographic images of the neck were acquired using MRA technique without and with intravenous contrast. Carotid stenosis measurements (when applicable) are obtained utilizing NASCET criteria, using the distal internal carotid diameter as the denominator. CONTRAST:  5mL GADAVIST  GADOBUTROL  1 MMOL/ML IV SOLN COMPARISON:  MRI head June 22, 2023. FINDINGS: MRI HEAD FINDINGS Brain: No acute infarction, hemorrhage, hydrocephalus, extra-axial collection or mass lesion. No pathologic intracranial enhancement. Remote perforator infarcts in the left basal ganglia and right thalamus. Moderate patchy T2/FLAIR hyperintensity in the white matter, nonspecific but compatible with chronic microvascular ischemic disease. Vascular: See below. Skull and upper cervical spine: Normal marrow signal. Sinuses/Orbits: Clear sinuses.  No acute orbital findings. Other: No mastoid effusions. MRA HEAD FINDINGS Anterior circulation: Bilateral intracranial ICAs, MCAs, and ACAs are patent without proximal hemodynamically significant  stenosis. Posterior circulation: Bilateral intradural vertebral arteries, basilar artery, and bilateral posterior rods are patent without proximal hemodynamically significant stenosis. Left fetal type PCA, anatomic variant. MRA NECK FINDINGS Aortic arch: Great vessel origins are patent. Right carotid system: Atherosclerosis at the carotid bifurcation with severe stenosis of the ICA origin. Left carotid system: Patent without significant stenosis. Vertebral arteries: Patent bilaterally without significant stenosis. Left dominant. IMPRESSION: 1. 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 A CTA of the neck could better quantify if clinically warranted. 2. No evidence of acute intracranial abnormality. 3. No large vessel occlusion or proximal hemodynamically significant stenosis intracranially. Electronically Signed   By: Gilmore GORMAN Molt M.D.   On: 09/26/2023 20:29   MR ANGIO NECK W WO CONTRAST Result Date: 09/26/2023 CLINICAL DATA:  Stroke, follow up; Brain/CNS neoplasm, assess treatment response EXAM: MRI HEAD WITHOUT CONTRAST MRA HEAD WITHOUT CONTRAST MRA NECK WITHOUT AND WITH CONTRAST TECHNIQUE: Multiplanar, multi-echo pulse sequences of the brain and surrounding structures were acquired without intravenous contrast. Angiographic images of the Circle of Willis were acquired using MRA technique without intravenous contrast. Angiographic  images of the neck were acquired using MRA technique without and with intravenous contrast. Carotid stenosis measurements (when applicable) are obtained utilizing NASCET criteria, using the distal internal carotid diameter as the denominator. CONTRAST:  5mL GADAVIST  GADOBUTROL  1 MMOL/ML IV SOLN COMPARISON:  MRI head June 22, 2023. FINDINGS: MRI HEAD FINDINGS Brain: No acute infarction, hemorrhage, hydrocephalus, extra-axial collection or mass lesion. No pathologic intracranial enhancement. Remote perforator infarcts in the left basal ganglia and right thalamus.  Moderate patchy T2/FLAIR hyperintensity in the white matter, nonspecific but compatible with chronic microvascular ischemic disease. Vascular: See below. Skull and upper cervical spine: Normal marrow signal. Sinuses/Orbits: Clear sinuses.  No acute orbital findings. Other: No mastoid effusions. MRA HEAD FINDINGS Anterior circulation: Bilateral intracranial ICAs, MCAs, and ACAs are patent without proximal hemodynamically significant stenosis. Posterior circulation: Bilateral intradural vertebral arteries, basilar artery, and bilateral posterior rods are patent without proximal hemodynamically significant stenosis. Left fetal type PCA, anatomic variant. MRA NECK FINDINGS Aortic arch: Great vessel origins are patent. Right carotid system: Atherosclerosis at the carotid bifurcation with severe stenosis of the ICA origin. Left carotid system: Patent without significant stenosis. Vertebral arteries: Patent bilaterally without significant stenosis. Left dominant. IMPRESSION: 1. 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 A CTA of the neck could better quantify if clinically warranted. 2. No evidence of acute intracranial abnormality. 3. No large vessel occlusion or proximal hemodynamically significant stenosis intracranially. Electronically Signed   By: Gilmore GORMAN Molt M.D.   On: 09/26/2023 20:29   MR ANGIO HEAD WO CONTRAST Result Date: 09/26/2023 CLINICAL DATA:  Stroke, follow up; Brain/CNS neoplasm, assess treatment response EXAM: MRI HEAD WITHOUT CONTRAST MRA HEAD WITHOUT CONTRAST MRA NECK WITHOUT AND WITH CONTRAST TECHNIQUE: Multiplanar, multi-echo pulse sequences of the brain and surrounding structures were acquired without intravenous contrast. Angiographic images of the Circle of Willis were acquired using MRA technique without intravenous contrast. Angiographic images of the neck were acquired using MRA technique without and with intravenous contrast. Carotid stenosis measurements (when applicable) are  obtained utilizing NASCET criteria, using the distal internal carotid diameter as the denominator. CONTRAST:  5mL GADAVIST  GADOBUTROL  1 MMOL/ML IV SOLN COMPARISON:  MRI head June 22, 2023. FINDINGS: MRI HEAD FINDINGS Brain: No acute infarction, hemorrhage, hydrocephalus, extra-axial collection or mass lesion. No pathologic intracranial enhancement. Remote perforator infarcts in the left basal ganglia and right thalamus. Moderate patchy T2/FLAIR hyperintensity in the white matter, nonspecific but compatible with chronic microvascular ischemic disease. Vascular: See below. Skull and upper cervical spine: Normal marrow signal. Sinuses/Orbits: Clear sinuses.  No acute orbital findings. Other: No mastoid effusions. MRA HEAD FINDINGS Anterior circulation: Bilateral intracranial ICAs, MCAs, and ACAs are patent without proximal hemodynamically significant stenosis. Posterior circulation: Bilateral intradural vertebral arteries, basilar artery, and bilateral posterior rods are patent without proximal hemodynamically significant stenosis. Left fetal type PCA, anatomic variant. MRA NECK FINDINGS Aortic arch: Great vessel origins are patent. Right carotid system: Atherosclerosis at the carotid bifurcation with severe stenosis of the ICA origin. Left carotid system: Patent without significant stenosis. Vertebral arteries: Patent bilaterally without significant stenosis. Left dominant. IMPRESSION: 1. 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 A CTA of the neck could better quantify if clinically warranted. 2. No evidence of acute intracranial abnormality. 3. No large vessel occlusion or proximal hemodynamically significant stenosis intracranially. Electronically Signed   By: Gilmore GORMAN Molt M.D.   On: 09/26/2023 20:29   CT CHEST ABDOMEN PELVIS WO CONTRAST Result Date: 09/21/2023 EXAMINATION: CT CHEST ABDOMEN PELVIS  WO CONTRAST CLINICAL INDICATION: Female, 83 years old. lung cancer TECHNIQUE: Helical CT scan  examination of the chest, abdomen, and pelvis is performed from the domes of the diaphragm to the pubic symphysis. Unless otherwise specified, incidental thyroid , adrenal, renal lesions do not require dedicated imaging follow up. Additionally, any mentioned pulmonary nodules do not require dedicated imaging follow-up based on the Fleischner guidelines unless otherwise specified. Coronary calcifications are not identified unless otherwise specified. COMPARISON: 05/11/2023 FINDINGS: CHEST: Right chest wall Mediport catheter tip terminates in the SVC. The thyroid  appears normal. The thoracic aorta is not aneurysmal. Scattered calcified atherosclerotic changes are present. The main pulmonary artery is borderline enlarged. The heart is normal in size. There are coronary calcifications. There is similar matted lymphadenopathy in the prevascular space measuring up to 10 mm in thickness (series 2 image 20). The trachea and mainstem bronchi appear patent. There are similar subtle groundglass bilateral pulmonary nodules for instance in the right upper lobe there is an unchanged 9 mm nodule (series 2 image 15). No new or enlarging pulmonary nodules are appreciated. ABDOMEN/PELVIS: The liver contains scattered cysts. There is cholelithiasis. The spleen is normal. Pancreas is normal. Adrenals are normal. Small nonobstructing left intrarenal stone noted. Right ureteral stent is seen. The abdominal aorta is normal in caliber. Scattered calcified atherosclerotic changes are present. The bladder is normal. The uterus is surgically absent. Large and small bowel loops are otherwise within normal limits. No free fluid or adenopathy. BONES: There are degenerative changes of the spine. Lumbar spinal fusion noted. There are degenerative changes of the bony pelvis. IMPRESSION: Similar matted prevascular mediastinal adenopathy. Similar scattered groundglass bilateral pulmonary nodules No evidence for metastatic disease within the abdomen or  pelvis. DOSE REDUCTION: All CT scans are performed using radiation dose reduction techniques, when applicable. Technical factors are evaluated and adjusted to ensure appropriate moderation of exposure. Electronically signed by: [place] Engel MD 09/21/2023 07:00 PM EDT RP Workstation: [id number]

## 2023-10-19 NOTE — Assessment & Plan Note (Signed)
 12/21/2022 Non-muscle invasive bladder cancer. S/p TURBT, T1 lesion, high grade.  03/29/2023 Non-muscle invasive bladder cancer. S/p TURBT Patient is s/p intravesical BCG treatments-she is off  suppressive antibiotics Follow-up with urology

## 2023-10-19 NOTE — Assessment & Plan Note (Signed)
 Continue KCL daily

## 2023-10-19 NOTE — Assessment & Plan Note (Signed)
 History of small acute infarcts on MRI brain.  She is asymptomatic, stenosis of the right ICA  Follow up with neurology Dr. Mark Sil

## 2023-10-19 NOTE — Assessment & Plan Note (Signed)
 Non-small cell lung cancer, favor squamous cell carcinoma, stage IV- TPS 90%, TMB 10, KRAS G12V MRI brain with and without contrast -  Left occipital focus of prior hemorrhage with mild linear contrast enhancement. Possible small metastasis or microhemmorhagic event.  TPS 90%, CT in May 2025 showed stable disease Labs are reviewed and discussed with patient.  Proceed with Keytruda 

## 2023-11-09 ENCOUNTER — Inpatient Hospital Stay

## 2023-11-09 ENCOUNTER — Encounter: Payer: Self-pay | Admitting: Oncology

## 2023-11-09 ENCOUNTER — Inpatient Hospital Stay (HOSPITAL_BASED_OUTPATIENT_CLINIC_OR_DEPARTMENT_OTHER): Admitting: Oncology

## 2023-11-09 ENCOUNTER — Inpatient Hospital Stay: Attending: Oncology

## 2023-11-09 VITALS — BP 129/78 | HR 95 | Temp 97.1°F | Resp 18 | Wt 108.6 lb

## 2023-11-09 DIAGNOSIS — C3412 Malignant neoplasm of upper lobe, left bronchus or lung: Secondary | ICD-10-CM | POA: Insufficient documentation

## 2023-11-09 DIAGNOSIS — D649 Anemia, unspecified: Secondary | ICD-10-CM

## 2023-11-09 DIAGNOSIS — Z5112 Encounter for antineoplastic immunotherapy: Secondary | ICD-10-CM

## 2023-11-09 DIAGNOSIS — Z7982 Long term (current) use of aspirin: Secondary | ICD-10-CM | POA: Insufficient documentation

## 2023-11-09 DIAGNOSIS — C679 Malignant neoplasm of bladder, unspecified: Secondary | ICD-10-CM

## 2023-11-09 DIAGNOSIS — C3482 Malignant neoplasm of overlapping sites of left bronchus and lung: Secondary | ICD-10-CM

## 2023-11-09 DIAGNOSIS — F17210 Nicotine dependence, cigarettes, uncomplicated: Secondary | ICD-10-CM | POA: Diagnosis not present

## 2023-11-09 DIAGNOSIS — E876 Hypokalemia: Secondary | ICD-10-CM | POA: Insufficient documentation

## 2023-11-09 DIAGNOSIS — C787 Secondary malignant neoplasm of liver and intrahepatic bile duct: Secondary | ICD-10-CM | POA: Diagnosis present

## 2023-11-09 DIAGNOSIS — Z86711 Personal history of pulmonary embolism: Secondary | ICD-10-CM | POA: Diagnosis not present

## 2023-11-09 DIAGNOSIS — Z8551 Personal history of malignant neoplasm of bladder: Secondary | ICD-10-CM | POA: Diagnosis not present

## 2023-11-09 DIAGNOSIS — C771 Secondary and unspecified malignant neoplasm of intrathoracic lymph nodes: Secondary | ICD-10-CM | POA: Diagnosis not present

## 2023-11-09 DIAGNOSIS — Z79899 Other long term (current) drug therapy: Secondary | ICD-10-CM | POA: Diagnosis not present

## 2023-11-09 LAB — CMP (CANCER CENTER ONLY)
ALT: 17 U/L (ref 0–44)
AST: 23 U/L (ref 15–41)
Albumin: 4.2 g/dL (ref 3.5–5.0)
Alkaline Phosphatase: 63 U/L (ref 38–126)
Anion gap: 10 (ref 5–15)
BUN: 33 mg/dL — ABNORMAL HIGH (ref 8–23)
CO2: 25 mmol/L (ref 22–32)
Calcium: 9.5 mg/dL (ref 8.9–10.3)
Chloride: 103 mmol/L (ref 98–111)
Creatinine: 1.14 mg/dL — ABNORMAL HIGH (ref 0.44–1.00)
GFR, Estimated: 48 mL/min — ABNORMAL LOW (ref >60)
Glucose, Bld: 134 mg/dL — ABNORMAL HIGH (ref 70–99)
Potassium: 3.8 mmol/L (ref 3.5–5.1)
Sodium: 138 mmol/L (ref 135–145)
Total Bilirubin: 0.6 mg/dL (ref 0.0–1.2)
Total Protein: 7.8 g/dL (ref 6.5–8.1)

## 2023-11-09 LAB — CBC WITH DIFFERENTIAL (CANCER CENTER ONLY)
Abs Immature Granulocytes: 0.02 K/uL (ref 0.00–0.07)
Basophils Absolute: 0 K/uL (ref 0.0–0.1)
Basophils Relative: 1 %
Eosinophils Absolute: 0.4 K/uL (ref 0.0–0.5)
Eosinophils Relative: 6 %
HCT: 35.6 % — ABNORMAL LOW (ref 36.0–46.0)
Hemoglobin: 11.5 g/dL — ABNORMAL LOW (ref 12.0–15.0)
Immature Granulocytes: 0 %
Lymphocytes Relative: 25 %
Lymphs Abs: 1.6 K/uL (ref 0.7–4.0)
MCH: 28.7 pg (ref 26.0–34.0)
MCHC: 32.3 g/dL (ref 30.0–36.0)
MCV: 88.8 fL (ref 80.0–100.0)
Monocytes Absolute: 0.4 K/uL (ref 0.1–1.0)
Monocytes Relative: 6 %
Neutro Abs: 4 K/uL (ref 1.7–7.7)
Neutrophils Relative %: 62 %
Platelet Count: 282 K/uL (ref 150–400)
RBC: 4.01 MIL/uL (ref 3.87–5.11)
RDW: 17.2 % — ABNORMAL HIGH (ref 11.5–15.5)
WBC Count: 6.4 K/uL (ref 4.0–10.5)
nRBC: 0 % (ref 0.0–0.2)

## 2023-11-09 LAB — TSH: TSH: 2.436 u[IU]/mL (ref 0.350–4.500)

## 2023-11-09 MED ORDER — HEPARIN SOD (PORK) LOCK FLUSH 100 UNIT/ML IV SOLN
500.0000 [IU] | Freq: Once | INTRAVENOUS | Status: AC | PRN
Start: 2023-11-09 — End: 2023-11-09
  Administered 2023-11-09: 500 [IU]
  Filled 2023-11-09: qty 5

## 2023-11-09 MED ORDER — SODIUM CHLORIDE 0.9 % IV SOLN
200.0000 mg | Freq: Once | INTRAVENOUS | Status: AC
  Administered 2023-11-09: 200 mg via INTRAVENOUS
  Filled 2023-11-09: qty 8

## 2023-11-09 MED ORDER — SODIUM CHLORIDE 0.9 % IV SOLN
Freq: Once | INTRAVENOUS | Status: AC
Start: 2023-11-09 — End: 2023-11-09
  Filled 2023-11-09: qty 250

## 2023-11-09 NOTE — Assessment & Plan Note (Signed)
 Non-small cell lung cancer, favor squamous cell carcinoma, stage IV- TPS 90%, TMB 10, KRAS G12V MRI brain with and without contrast -  Left occipital focus of prior hemorrhage with mild linear contrast enhancement. Possible small metastasis or microhemmorhagic event.  TPS 90%, CT in May 2025 showed stable disease Labs are reviewed and discussed with patient.  Proceed with Keytruda 

## 2023-11-09 NOTE — Assessment & Plan Note (Signed)
 Continue KCL daily

## 2023-11-09 NOTE — Progress Notes (Signed)
 Hematology/Oncology Progress note [phone] [phone]       REASON OF VISIT Stage IV Lung squamous cell carcinoma  ASSESSMENT & PLAN:   Cancer Staging  Malignant neoplasm of lung (HCC) Staging form: Lung, AJCC 8th Edition - Clinical: Stage IVA (cT4, cN3, cM1a) - Signed by Babara Call, MD on 06/28/2022  Urothelial carcinoma of bladder [facility]) Staging form: Urinary Bladder, AJCC 8th Edition - Clinical stage from 12/21/2022: Stage I (cT1, cN0, cM0) - Signed by Babara Call, MD on 12/29/2022   Malignant neoplasm of lung (HCC) Non-small cell lung cancer, favor squamous cell carcinoma, stage IV- TPS 90%, TMB 10, KRAS G12V MRI brain with and without contrast -  Left occipital focus of prior hemorrhage with mild linear contrast enhancement. Possible small metastasis or microhemmorhagic event.  TPS 90%, CT in May 2025 showed stable disease Labs are reviewed and discussed with patient.  Proceed with Keytruda   Encounter for antineoplastic immunotherapy Immunotherapy treatment plan as listed above.  History of pulmonary embolism off  Elqiuis 5mg  BID due to GI bleeding  Hypokalemia Continue KCL 10meq daily   Normocytic anemia Chronic anemia.  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  03/29/2023 Non-muscle invasive bladder cancer. S/p TURBT Patient is s/p intravesical BCG treatments-she is off  suppressive antibiotics Recommend patient to Follow-up with urology    Orders Placed This Encounter  Procedures   CBC with Differential (Cancer Center Only)    Standing Status:   Future    Expected Date:   12/21/2023    Expiration Date:   12/20/2024   CMP (Cancer Center only)    Standing Status:   Future    Expected Date:   12/21/2023    Expiration Date:   12/20/2024   Follow up in 3 weeks.   All questions were answered. The patient knows to call the clinic with any  problems, questions or concerns.  Call Babara, MD, PhD [facility] Hematology Oncology 11/09/2023       HISTORY OF PRESENTING ILLNESS:  Regina Perkins 83 y.o. 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 I have reviewed her chart and materials related to her cancer extensively and collaborated history with the patient. Summary of oncologic history is as follows: Oncology History  Malignant neoplasm of lung (HCC)  05/24/2022 Imaging   CT chest angiogram showed 1. No evidence for pulmonary embolism. 2. Left upper lobe/prevascular mass worrisome for neoplasm. 3. Mediastinal and hilar lymphadenopathy. 4. Multiple pulmonary nodules measuring up to 11 mm worrisome for metastatic disease. 5. Small left pleural effusion. 6. Patchy ground-glass and airspace opacities in the left upper lobe worrisome for infection. 7. Moderate-sized pericardial effusion. 8. Cholelithiasis. 9. Nonobstructing left renal calculus   05/25/2022 Imaging   CT abdomen pelvis wo contrast 1. No acute findings or explanation for the patient's symptoms. 2. No evidence of primary malignancy or metastatic disease within the abdomen or pelvis on noncontrast imaging.  3. Cholelithiasis without evidence of cholecystitis or biliary dilatation. 4. Nonobstructing left renal calculus. 5. Moderate stool throughout the colon suggesting constipation. 6.  Aortic Atherosclerosis    05/26/2022 Initial Diagnosis   Malignant neoplasm of lung - TPS 90%, TMB 10, KRAS G12V  -05/22/2022 in the emergency room, she was found to have hypoxia with oxygen levels of 86% on room air, improved to 94 with 2 L of  oxygen.  D-dimer was elevated at 1.29, troponin negative.  Lower extremity Doppler was negative for DVT.  VQ scan high probability PE (Large segmental perfusion defect of the left upper lobe without corresponding radiographic abnormality. Additional  bilateral wedge-shaped subsegmental branch perfusion defects .  Patient was admitted and started on heparin  Echocardiogram showed no RV strain.  Moderate pericardial effusion. Over her hospitalization, she continues to have shortness of breath with minimal exertion. 05/24/2022, CT chest angiogram showed left lung mass with bilateral lung nodules.   06/16/2022 Imaging   PET scan  1. 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 No evidence of metastatic disease in the abdomen or pelvis. 2. Moderate pericardial effusion. 3. Tiny left pleural effusion. 4. Left renal stone. 5. Aortic atherosclerosis (ICD10-I70.0). Coronary artery calcification. 6. 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    06/28/2022 Cancer Staging   Staging form: Lung, AJCC 8th Edition - Clinical: Stage IVA (cT4, cN3, cM1a) - Signed by Babara Call, MD on 06/28/2022   07/14/2022 -  Chemotherapy   Patient is on Treatment Plan : LUNG NSCLC Pembrolizumab  (200) q21d     09/29/2022 Imaging   CT chest abdomen pelvis w contrast showed Bilateral pulmonary nodules, some of which are waxing/waning, but favoring multifocal lung carcinoma/metastases.   Improving thoracic nodal metastases, as above.  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 Consider cystoscopy as clinically warranted.  Aortic Atherosclerosis (ICD10-I70.0) and Emphysema (ICD10-J43.9)   01/06/2023 Imaging   CT chest abdomen pelvis w contrast showed 1. Interval decrease in size of multiple spiculated and  subsolid pulmonary nodules, consistent with treatment response. 2. No significant change in matted post treatment prevascular and left superior mediastinal soft tissue. 3. Diminished size of an endoluminal bladder mass seen on prior examination, soft tissue in this vicinity now measuring no greater than 2.1 x 1.4 cm, previously at least 4.7 x 2.1 cm. Presumably this has been at least partially resected. 4. No evidence of lymphadenopathy or metastatic disease in the abdomen or pelvis. 5. Cholelithiasis. 6. Nonobstructive left nephrolithiasis. 7. Coronary artery disease.   01/10/2023 Procedure   Medi port placed by IR   05/11/2023 Imaging   CT chest abdomen pelvis w contrast showed 1. Unchanged matted, treated prevascular and left superior mediastinal soft tissue. 2. Multiple bilateral spiculated and ground-glass pulmonary nodules not significantly changed. 3. No residual mass of the posterior right aspect of the urinary bladder. Mild residual thickening in this vicinity. Interval placement of right-sided double-J ureteral stent catheter. 4. No evidence of lymphadenopathy or metastatic disease in the abdomen or pelvis. 5. Emphysema and diffuse bilateral bronchial wall thickening. 6. Coronary artery disease.   Aortic Atherosclerosis (ICD10-I70.0) and Emphysema (ICD10-J43.9).    Urothelial carcinoma of bladder (HCC)  10/06/2022 Initial Diagnosis   Urothelial carcinoma of bladder S/p TURBT  1. 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 Bladder, biopsy, Base of tumor HIGH GRADE PAPILLARY UROTHELIAL CARCINOMA SUBMUCOSA AND MUSCULARIS PROPRIA IS PRESENT AND NOT INVOLVED BY CARCINOMA  Postprocedure, patient developed gross hematuria  and was admitted to the hospital.  She received PRBC  transfusion.  Hemoglobin improved and she was discharged.    12/21/2022 Cancer Staging   Staging form: Urinary Bladder, AJCC 8th Edition - Clinical stage from 12/21/2022: Stage I (cT1, cN0, cM0) - Signed by Babara Call, MD on 12/29/2022 WHO/ISUP grade (low/high): High Grade Histologic grading system: 2 grade system     INTERVAL HISTORY Regina Perkins is a 83 y.o. female who has above history reviewed by me today presents for follow up visit for Lung squamous cell carcinoma She tolerates [place] ([place]) well. Denies skin rash, diarrhea.  Patient has no new complaints.  Denies chest pain, chest palpitation. She denies headache, focal weakness or slurred speech.    MEDICAL HISTORY:  Past Medical History:  Diagnosis Date   Atherosclerosis of native arteries of extremity with intermittent claudication (HCC)    Bladder mass    Chronic kidney disease, stage 3a (HCC)    COPD (chronic obstructive pulmonary disease) (HCC)    Diabetes mellitus without complication (HCC)    Gastrointestinal hemorrhage    GERD (gastroesophageal reflux disease)    Glaucoma    History of duodenal ulcer    HTN (hypertension)    Hypertension    Hypokalemia    Hypomagnesemia    Leukocytosis    Lung mass    Malignant neoplasm of lung (HCC) 06/28/2022   a.) stage IVA (cT4, cN3, cM1a) favor squamous cell carcinoma --> TPS 90%, TMB 10, KRAS G12V   Malnutrition (HCC)    Normocytic anemia    Pericardial effusion 05/2022   Pulmonary embolism (HCC)    Sepsis (HCC)    Tobacco abuse    Type II diabetes mellitus with renal manifestations (HCC)    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   a.) stage I (cT1, cN0, cM0)    SURGICAL HISTORY: Past Surgical History:  Procedure Laterality Date   ABDOMINAL HYSTERECTOMY     BACK SURGERY     1988 and 1998   CYSTOSCOPY W/ RETROGRADES Bilateral 12/21/2022   Procedure: CYSTOSCOPY WITH RETROGRADE PYELOGRAM;  Surgeon: Twylla Glendia BROCKS, MD;  Location: [facility];  Service: Urology;   Laterality: Bilateral;   CYSTOSCOPY WITH BIOPSY N/A 03/29/2023   Procedure: CYSTOSCOPY WITH BLADDER BIOPSY;  Surgeon: Twylla Glendia BROCKS, MD;  Location: [facility];  Service: Urology;  Laterality: N/A;   CYSTOSCOPY WITH STENT PLACEMENT Right 03/29/2023   Procedure: CYSTOSCOPY WITH STENT PLACEMENT;  Surgeon: Twylla Glendia BROCKS, MD;  Location: [facility];  Service: Urology;  Laterality: Right;   ESOPHAGOGASTRODUODENOSCOPY (EGD) WITH PROPOFOL  N/A 07/29/2022   Procedure: ESOPHAGOGASTRODUODENOSCOPY (EGD) WITH PROPOFOL ;  Surgeon: Therisa Bi, MD;  Location: [facility];  Service: Gastroenterology;  Laterality: N/A;   ESOPHAGOGASTRODUODENOSCOPY (EGD) WITH PROPOFOL  N/A 01/21/2023   Procedure: ESOPHAGOGASTRODUODENOSCOPY (EGD) WITH PROPOFOL ;  Surgeon: Therisa Bi, MD;  Location: [facility];  Service: Gastroenterology;  Laterality: N/A;   HOLMIUM LASER APPLICATION Right 03/29/2023   Procedure: HOLMIUM LASER ABLATION;  Surgeon: Twylla Glendia BROCKS, MD;  Location: [facility];  Service: Urology;  Laterality: Right;   IR IMAGING GUIDED PORT INSERTION  01/10/2023   TRANSURETHRAL RESECTION OF BLADDER TUMOR N/A 12/21/2022   Procedure: TRANSURETHRAL RESECTION OF BLADDER TUMOR (TURBT);  Surgeon: Twylla Glendia BROCKS, MD;  Location: [facility];  Service: Urology;  Laterality: N/A;   TRANSURETHRAL RESECTION OF BLADDER TUMOR N/A 03/29/2023   Procedure: TRANSURETHRAL RESECTION OF BLADDER TUMOR (TURBT);  Surgeon: Twylla Glendia BROCKS, MD;  Location: [facility];  Service: Urology;  Laterality: N/A;  URETEROSCOPY Right 03/29/2023   Procedure: DIAGNOSTIC URETEROSCOPY;  Surgeon: Twylla Glendia BROCKS, MD;  Location: [facility];  Service: Urology;  Laterality: Right;    SOCIAL HISTORY: Social History   Socioeconomic History   Marital status: Widowed    Spouse name: Not on file   Number of children: Not on file   Years of education: Not on file   Highest education level: Not on file  Occupational History   Not on file  Tobacco Use   Smoking status:  Every Day    Current packs/day: 0.25    Types: Cigarettes   Smokeless tobacco: Never  Vaping Use   Vaping status: Never Used  Substance and Sexual Activity   Alcohol use: No   Drug use: No   Sexual activity: Not on file  Other Topics Concern   Not on file  Social History Narrative   Not on file   Social Drivers of Health   Financial Resource Strain: Low Risk  (06/28/2022)   Overall Financial Resource Strain (CARDIA)    Difficulty of Paying Living Expenses: Not very hard  Food Insecurity: No Food Insecurity (12/22/2022)   Hunger Vital Sign    Worried About Running Out of Food in the Last Year: Never true    Ran Out of Food in the Last Year: Never true  Transportation Needs: No Transportation Needs (12/22/2022)   PRAPARE - Administrator, Civil Service (Medical): No    Lack of Transportation (Non-Medical): No  Physical Activity: Not on file  Stress: No Stress Concern Present (06/28/2022)   [employer] of Occupational Health - Occupational Stress Questionnaire    Feeling of Stress : Not at all  Social Connections: Not on file  Intimate Partner Violence: Not At Risk (12/22/2022)   Humiliation, Afraid, Rape, and Kick questionnaire    Fear of Current or Ex-Partner: No    Emotionally Abused: No    Physically Abused: No    Sexually Abused: No    FAMILY HISTORY: Family History  Problem Relation Age of Onset   Asthma Mother    Heart disease Mother    Hypertension Mother    Diabetes Mother    Stroke Mother    Diabetes Sister    Diabetes Brother     ALLERGIES:  is allergic to ivp dye [iodinated contrast media] and shellfish allergy.  MEDICATIONS:  Current Outpatient Medications  Medication Sig Dispense Refill   albuterol  (VENTOLIN  HFA) 108 (90 Base) MCG/ACT inhaler Inhale 2 puffs into the lungs every 4 (four) hours as needed for wheezing or shortness of breath.     amLODipine  (NORVASC ) 10 MG tablet Take 10 mg by mouth every morning.     aspirin  EC 81 MG  tablet Take 1 tablet (81 mg total) by mouth daily. Swallow whole. 30 tablet 12   cyanocobalamin  (VITAMIN B12) 1000 MCG tablet Take 1 tablet (1,000 mcg total) by mouth daily. 90 tablet 0   lidocaine -prilocaine  (EMLA ) cream Apply 1 Application topically as needed. Apply to port and cover with saran wrap 1-2 hours prior to port access 30 g 1   lisinopril  (ZESTRIL ) 40 MG tablet Take 40 mg by mouth every morning.     metFORMIN  (GLUCOPHAGE ) 1000 MG tablet Take 1,000 mg by mouth daily with breakfast.     Current Facility-Administered Medications  Medication Dose Route Frequency Provider Last Rate Last Admin   bcg vaccine injection 81 mg  3.24 mL Bladder Instillation Once        Facility-Administered  Medications Ordered in Other Visits  Medication Dose Route Frequency Provider Last Rate Last Admin   0.9 %  sodium chloride  infusion   Intravenous Once Tashina Credit, MD       pembrolizumab  (KEYTRUDA ) 200 mg in sodium chloride  0.9 % 50 mL chemo infusion  200 mg Intravenous Once Babara Call, MD        Review of Systems  Constitutional:  Negative for appetite change, chills, fatigue and fever.  HENT:   Negative for hearing loss and voice change.   Eyes:  Negative for eye problems.  Respiratory:  Negative for chest tightness, cough and shortness of breath.   Cardiovascular:  Negative for chest pain.  Gastrointestinal:  Negative for abdominal distention, abdominal pain and blood in stool.  Endocrine: Negative for hot flashes.  Genitourinary:  Negative for difficulty urinating and frequency.   Musculoskeletal:  Negative for arthralgias.  Skin:  Negative for itching and rash.  Neurological:  Negative for extremity weakness.  Hematological:  Negative for adenopathy.  Psychiatric/Behavioral:  Negative for confusion.      PHYSICAL EXAMINATION: ECOG PERFORMANCE STATUS: 2 - Symptomatic, <50% confined to bed  Vitals:   11/09/23 0904  BP: 129/78  Pulse: 95  Resp: 18  Temp: (!) 97.1 F (36.2 C)    Filed  Weights   11/09/23 0904  Weight: 108 lb 9.6 oz (49.3 kg)      Physical Exam Constitutional:      General: She is not in acute distress.    Appearance: She is not diaphoretic.  HENT:     Head: Normocephalic.  Eyes:     General: No scleral icterus.    Pupils: Pupils are equal, round, and reactive to light.  Cardiovascular:     Rate and Rhythm: Tachycardia present.     Heart sounds: Murmur heard.  Pulmonary:     Effort: Pulmonary effort is normal. No respiratory distress.     Comments: Decreased breath sounds bilaterally Abdominal:     General: There is no distension.  Musculoskeletal:        General: Normal range of motion.     Cervical back: Normal range of motion.  Skin:    Findings: No erythema.  Neurological:     Mental Status: She is alert and oriented to person, place, and time. Mental status is at baseline.     Cranial Nerves: No cranial nerve deficit.     Motor: No abnormal muscle tone.  Psychiatric:        Mood and Affect: Affect normal.      LABORATORY DATA:  I have reviewed the data as listed    Latest Ref Rng & Units 11/09/2023    8:48 AM 10/19/2023    8:32 AM 09/28/2023    8:49 AM  CBC  WBC 4.0 - 10.5 K/uL 6.4  5.6  7.3   Hemoglobin 12.0 - 15.0 g/dL 88.4  89.5  89.1   Hematocrit 36.0 - 46.0 % 35.6  32.2  34.0   Platelets 150 - 400 K/uL 282  252  368       Latest Ref Rng & Units 11/09/2023    8:48 AM 10/19/2023    8:32 AM 09/28/2023    8:49 AM  CMP  Glucose 70 - 99 mg/dL 865  824  841   BUN 8 - 23 mg/dL 33  20  15   Creatinine 0.44 - 1.00 mg/dL 8.85  9.04  9.02   Sodium 135 - 145 mmol/L 138  140  136   Potassium 3.5 - 5.1 mmol/L 3.8  3.6  3.3   Chloride 98 - 111 mmol/L 103  104  102   CO2 22 - 32 mmol/L 25  27  25    Calcium  8.9 - 10.3 mg/dL 9.5  9.1  8.5   Total Protein 6.5 - 8.1 g/dL 7.8  7.1  6.7   Total Bilirubin 0.0 - 1.2 mg/dL 0.6  0.4  0.6   Alkaline Phos 38 - 126 U/L 63  56  55   AST 15 - 41 U/L 23  23  17    ALT 0 - 44 U/L 17  13  10        RADIOGRAPHIC STUDIES: I have personally reviewed the radiological images as listed and agreed with the findings in the report. No results found.

## 2023-11-09 NOTE — Patient Instructions (Signed)

## 2023-11-09 NOTE — Assessment & Plan Note (Signed)
 12/21/2022 Non-muscle invasive bladder cancer. S/p TURBT, T1 lesion, high grade.  03/29/2023 Non-muscle invasive bladder cancer. S/p TURBT Patient is s/p intravesical BCG treatments-she is off  suppressive antibiotics Recommend patient to Follow-up with urology

## 2023-11-09 NOTE — Assessment & Plan Note (Signed)
 Chronic anemia.  Hemoglobin is stable

## 2023-11-09 NOTE — Assessment & Plan Note (Signed)
 off  Elqiuis 5mg  BID due to GI bleeding

## 2023-11-09 NOTE — Assessment & Plan Note (Signed)
 Immunotherapy treatment plan as listed above.

## 2023-11-10 LAB — T4: T4, Total: 9.8 ug/dL (ref 4.5–12.0)

## 2023-11-30 ENCOUNTER — Inpatient Hospital Stay (HOSPITAL_BASED_OUTPATIENT_CLINIC_OR_DEPARTMENT_OTHER): Admitting: Oncology

## 2023-11-30 ENCOUNTER — Encounter: Payer: Self-pay | Admitting: Oncology

## 2023-11-30 ENCOUNTER — Inpatient Hospital Stay

## 2023-11-30 ENCOUNTER — Inpatient Hospital Stay: Attending: Oncology

## 2023-11-30 VITALS — BP 164/112 | HR 87 | Temp 98.1°F | Resp 18 | Wt 111.3 lb

## 2023-11-30 DIAGNOSIS — Z79899 Other long term (current) drug therapy: Secondary | ICD-10-CM | POA: Insufficient documentation

## 2023-11-30 DIAGNOSIS — C787 Secondary malignant neoplasm of liver and intrahepatic bile duct: Secondary | ICD-10-CM | POA: Insufficient documentation

## 2023-11-30 DIAGNOSIS — C771 Secondary and unspecified malignant neoplasm of intrathoracic lymph nodes: Secondary | ICD-10-CM | POA: Diagnosis not present

## 2023-11-30 DIAGNOSIS — Z5112 Encounter for antineoplastic immunotherapy: Secondary | ICD-10-CM | POA: Diagnosis not present

## 2023-11-30 DIAGNOSIS — D649 Anemia, unspecified: Secondary | ICD-10-CM

## 2023-11-30 DIAGNOSIS — Z7982 Long term (current) use of aspirin: Secondary | ICD-10-CM | POA: Diagnosis not present

## 2023-11-30 DIAGNOSIS — C679 Malignant neoplasm of bladder, unspecified: Secondary | ICD-10-CM | POA: Diagnosis not present

## 2023-11-30 DIAGNOSIS — F17210 Nicotine dependence, cigarettes, uncomplicated: Secondary | ICD-10-CM | POA: Insufficient documentation

## 2023-11-30 DIAGNOSIS — C3482 Malignant neoplasm of overlapping sites of left bronchus and lung: Secondary | ICD-10-CM

## 2023-11-30 DIAGNOSIS — E876 Hypokalemia: Secondary | ICD-10-CM | POA: Diagnosis not present

## 2023-11-30 DIAGNOSIS — C3412 Malignant neoplasm of upper lobe, left bronchus or lung: Secondary | ICD-10-CM | POA: Diagnosis present

## 2023-11-30 LAB — CBC WITH DIFFERENTIAL (CANCER CENTER ONLY)
Abs Immature Granulocytes: 0.03 K/uL (ref 0.00–0.07)
Basophils Absolute: 0 K/uL (ref 0.0–0.1)
Basophils Relative: 0 %
Eosinophils Absolute: 0.4 K/uL (ref 0.0–0.5)
Eosinophils Relative: 4 %
HCT: 33 % — ABNORMAL LOW (ref 36.0–46.0)
Hemoglobin: 10.7 g/dL — ABNORMAL LOW (ref 12.0–15.0)
Immature Granulocytes: 0 %
Lymphocytes Relative: 18 %
Lymphs Abs: 1.6 K/uL (ref 0.7–4.0)
MCH: 29 pg (ref 26.0–34.0)
MCHC: 32.4 g/dL (ref 30.0–36.0)
MCV: 89.4 fL (ref 80.0–100.0)
Monocytes Absolute: 0.6 K/uL (ref 0.1–1.0)
Monocytes Relative: 7 %
Neutro Abs: 6 K/uL (ref 1.7–7.7)
Neutrophils Relative %: 71 %
Platelet Count: 264 K/uL (ref 150–400)
RBC: 3.69 MIL/uL — ABNORMAL LOW (ref 3.87–5.11)
RDW: 16.9 % — ABNORMAL HIGH (ref 11.5–15.5)
WBC Count: 8.6 K/uL (ref 4.0–10.5)
nRBC: 0 % (ref 0.0–0.2)

## 2023-11-30 LAB — CMP (CANCER CENTER ONLY)
ALT: 13 U/L (ref 0–44)
AST: 26 U/L (ref 15–41)
Albumin: 3.7 g/dL (ref 3.5–5.0)
Alkaline Phosphatase: 58 U/L (ref 38–126)
Anion gap: 10 (ref 5–15)
BUN: 22 mg/dL (ref 8–23)
CO2: 25 mmol/L (ref 22–32)
Calcium: 9.1 mg/dL (ref 8.9–10.3)
Chloride: 103 mmol/L (ref 98–111)
Creatinine: 1.03 mg/dL — ABNORMAL HIGH (ref 0.44–1.00)
GFR, Estimated: 54 mL/min — ABNORMAL LOW (ref >60)
Glucose, Bld: 181 mg/dL — ABNORMAL HIGH (ref 70–99)
Potassium: 3.4 mmol/L — ABNORMAL LOW (ref 3.5–5.1)
Sodium: 138 mmol/L (ref 135–145)
Total Bilirubin: 0.5 mg/dL (ref 0.0–1.2)
Total Protein: 7.1 g/dL (ref 6.5–8.1)

## 2023-11-30 MED ORDER — SODIUM CHLORIDE 0.9% FLUSH
10.0000 mL | INTRAVENOUS | Status: DC | PRN
Start: 2023-11-30 — End: 2023-11-30
  Administered 2023-11-30: 10 mL
  Filled 2023-11-30: qty 10

## 2023-11-30 MED ORDER — SODIUM CHLORIDE 0.9 % IV SOLN
200.0000 mg | Freq: Once | INTRAVENOUS | Status: AC
  Administered 2023-11-30: 200 mg via INTRAVENOUS
  Filled 2023-11-30: qty 8

## 2023-11-30 MED ORDER — SODIUM CHLORIDE 0.9 % IV SOLN
Freq: Once | INTRAVENOUS | Status: AC
  Filled 2023-11-30: qty 250

## 2023-11-30 NOTE — Assessment & Plan Note (Signed)
 Continue KCL daily

## 2023-11-30 NOTE — Assessment & Plan Note (Signed)
 Immunotherapy treatment plan as listed above.

## 2023-11-30 NOTE — Progress Notes (Signed)
 Hematology/Oncology Progress note [phone] [phone]       REASON OF VISIT Stage IV Lung squamous cell carcinoma  ASSESSMENT & PLAN:   Cancer Staging  Malignant neoplasm of lung (HCC) Staging form: Lung, AJCC 8th Edition - Clinical: Stage IVA (cT4, cN3, cM1a) - Signed by Babara Call, MD on 06/28/2022  Urothelial carcinoma of bladder [facility]) Staging form: Urinary Bladder, AJCC 8th Edition - Clinical stage from 12/21/2022: Stage I (cT1, cN0, cM0) - Signed by Babara Call, MD on 12/29/2022   Malignant neoplasm of lung (HCC) Non-small cell lung cancer, favor squamous cell carcinoma, stage IV- TPS 90%, TMB 10, KRAS G12V MRI brain with and without contrast -  Left occipital focus of prior hemorrhage with mild linear contrast enhancement. Possible small metastasis or microhemmorhagic event.  TPS 90%, CT in May 2025 showed stable disease Labs are reviewed and discussed with patient.  Proceed with Keytruda   Encounter for antineoplastic immunotherapy Immunotherapy treatment plan as listed above.  Hypokalemia Continue KCL 10meq daily   Normocytic anemia Chronic anemia.  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  03/29/2023 Non-muscle invasive bladder cancer. S/p TURBT Patient is s/p intravesical BCG treatments-she is off  suppressive antibiotics Recommend patient to Follow-up with urology     Orders Placed This Encounter  Procedures   CBC with Differential (Cancer Center Only)    Standing Status:   Future    Expected Date:   01/11/2024    Expiration Date:   01/10/2025   CMP (Cancer Center only)    Standing Status:   Future    Expected Date:   01/11/2024    Expiration Date:   01/10/2025   T4    Standing Status:   Future    Expected Date:   01/11/2024    Expiration Date:   01/10/2025   TSH    Standing Status:   Future    Expected Date:   01/11/2024    Expiration  Date:   01/10/2025   CBC with Differential (Cancer Center Only)    Standing Status:   Future    Expected Date:   02/01/2024    Expiration Date:   01/31/2025   CMP (Cancer Center only)    Standing Status:   Future    Expected Date:   02/01/2024    Expiration Date:   01/31/2025   Follow up in 3 weeks.   All questions were answered. The patient knows to call the clinic with any problems, questions or concerns.  Call Babara, MD, PhD [facility] Hematology Oncology 11/30/2023       HISTORY OF PRESENTING ILLNESS:  Regina Perkins 83 y.o. 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 I have reviewed her chart and materials related to her cancer extensively and collaborated history with the patient. Summary of oncologic history is as follows: Oncology History  Malignant neoplasm of lung (HCC)  05/24/2022 Imaging   CT chest angiogram showed 1. No evidence for pulmonary embolism. 2. Left upper lobe/prevascular mass worrisome for neoplasm. 3. Mediastinal and hilar lymphadenopathy. 4. Multiple pulmonary nodules measuring up to 11 mm worrisome for metastatic disease. 5. Small left pleural effusion. 6. Patchy ground-glass and airspace opacities in the left upper lobe worrisome for infection. 7. Moderate-sized pericardial effusion. 8. Cholelithiasis. 9. Nonobstructing left renal calculus   05/25/2022 Imaging   CT abdomen  pelvis wo contrast 1. No acute findings or explanation for the patient's symptoms. 2. No evidence of primary malignancy or metastatic disease within the abdomen or pelvis on noncontrast imaging.  3. Cholelithiasis without evidence of cholecystitis or biliary dilatation. 4. Nonobstructing left renal calculus. 5. Moderate stool throughout the colon suggesting constipation. 6.  Aortic Atherosclerosis    05/26/2022 Initial Diagnosis   Malignant neoplasm of lung - TPS 90%, TMB 10, KRAS  G12V  -05/22/2022 in the emergency room, she was found to have hypoxia with oxygen levels of 86% on room air, improved to 94 with 2 L of oxygen.  D-dimer was elevated at 1.29, troponin negative.  Lower extremity Doppler was negative for DVT.  VQ scan high probability PE (Large segmental perfusion defect of the left upper lobe without corresponding radiographic abnormality. Additional bilateral wedge-shaped subsegmental branch perfusion defects .  Patient was admitted and started on heparin  Echocardiogram showed no RV strain.  Moderate pericardial effusion. Over her hospitalization, she continues to have shortness of breath with minimal exertion. 05/24/2022, CT chest angiogram showed left lung mass with bilateral lung nodules.   06/16/2022 Imaging   PET scan  1. 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 No evidence of metastatic disease in the abdomen or pelvis. 2. Moderate pericardial effusion. 3. Tiny left pleural effusion. 4. Left renal stone. 5. Aortic atherosclerosis (ICD10-I70.0). Coronary artery calcification. 6. 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    06/28/2022 Cancer Staging   Staging form: Lung, AJCC 8th Edition - Clinical: Stage IVA (cT4, cN3, cM1a) - Signed by Babara Call, MD on 06/28/2022   07/14/2022 -  Chemotherapy   Patient is on Treatment Plan : LUNG NSCLC Pembrolizumab  (200) q21d     09/29/2022 Imaging   CT chest abdomen pelvis w contrast showed Bilateral pulmonary nodules, some of which are  waxing/waning, but favoring multifocal lung carcinoma/metastases.   Improving thoracic nodal metastases, as above.  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 Consider cystoscopy as clinically warranted.  Aortic Atherosclerosis (ICD10-I70.0) and Emphysema (ICD10-J43.9)   01/06/2023 Imaging   CT chest abdomen pelvis w contrast showed 1. Interval decrease in size of multiple spiculated and subsolid pulmonary nodules, consistent with treatment response. 2. No significant change in matted post treatment prevascular and left superior mediastinal soft tissue. 3. Diminished size of an endoluminal bladder mass seen on prior examination, soft tissue in this vicinity now measuring no greater than 2.1 x 1.4 cm, previously at least 4.7 x 2.1 cm. Presumably this has been at least partially resected. 4. No evidence of lymphadenopathy or metastatic disease in the abdomen or pelvis. 5. Cholelithiasis. 6. Nonobstructive left nephrolithiasis. 7. Coronary artery disease.   01/10/2023 Procedure   Medi port placed by IR   05/11/2023 Imaging   CT chest abdomen pelvis w contrast showed 1. Unchanged matted, treated prevascular and left superior mediastinal soft tissue. 2. Multiple bilateral spiculated and ground-glass pulmonary nodules not significantly changed. 3. No residual mass of the posterior right aspect of the urinary bladder. Mild residual thickening in this vicinity. Interval placement of right-sided double-J ureteral stent catheter. 4. No evidence of lymphadenopathy or metastatic disease in the abdomen  or pelvis. 5. Emphysema and diffuse bilateral bronchial wall thickening. 6. Coronary artery disease.   Aortic Atherosclerosis (ICD10-I70.0) and Emphysema (ICD10-J43.9).    Urothelial carcinoma of bladder (HCC)  10/06/2022 Initial Diagnosis   Urothelial carcinoma of bladder S/p TURBT  1. Bladder, transurethral resection, Tumor HIGH GRADE PAPILLARY UROTHELIAL CARCINOMA WITH SQUAMOUS CELL  COMPONENT (10%) THE CARCINOMA FOCALLY INVADES LAMINAR PROPRIA MUSCULARIS PROPRIA (DETRUSOR MUSCLE) IS PRESENT AND NOT INVOLVED BY CARCINOMA 2. Bladder, biopsy, Base of tumor HIGH GRADE PAPILLARY UROTHELIAL CARCINOMA SUBMUCOSA AND MUSCULARIS PROPRIA IS PRESENT AND NOT INVOLVED BY CARCINOMA  Postprocedure, patient developed gross hematuria and was admitted to the hospital.  She received PRBC transfusion.  Hemoglobin improved and she was discharged.    12/21/2022 Cancer Staging   Staging form: Urinary Bladder, AJCC 8th Edition - Clinical stage from 12/21/2022: Stage I (cT1, cN0, cM0) - Signed by Babara Call, MD on 12/29/2022 WHO/ISUP grade (low/high): High Grade Histologic grading system: 2 grade system     INTERVAL HISTORY Regina Perkins is a 83 y.o. female who has above history reviewed by me today presents for follow up visit for Lung squamous cell carcinoma She tolerates [place] well. Denies skin rash, diarrhea.  Patient has no new complaints.  Denies chest pain, chest palpitation. She denies headache, focal weakness or slurred speech.    MEDICAL HISTORY:  Past Medical History:  Diagnosis Date   Atherosclerosis of native arteries of extremity with intermittent claudication (HCC)    Bladder mass    Chronic kidney disease, stage 3a (HCC)    COPD (chronic obstructive pulmonary disease) (HCC)    Diabetes mellitus without complication (HCC)    Gastrointestinal hemorrhage    GERD (gastroesophageal reflux disease)    Glaucoma    History of duodenal ulcer    HTN (hypertension)    Hypertension    Hypokalemia    Hypomagnesemia    Leukocytosis    Lung mass    Malignant neoplasm of lung (HCC) 06/28/2022   a.) stage IVA (cT4, cN3, cM1a) favor squamous cell carcinoma --> TPS 90%, TMB 10, KRAS G12V   Malnutrition (HCC)    Normocytic anemia    Pericardial effusion 05/2022   Pulmonary embolism (HCC)    Sepsis (HCC)    Tobacco abuse    Type II diabetes mellitus with renal manifestations  (HCC)    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   a.) stage I (cT1, cN0, cM0)    SURGICAL HISTORY: Past Surgical History:  Procedure Laterality Date   ABDOMINAL HYSTERECTOMY     BACK SURGERY     1988 and 1998   CYSTOSCOPY W/ RETROGRADES Bilateral 12/21/2022   Procedure: CYSTOSCOPY WITH RETROGRADE PYELOGRAM;  Surgeon: Twylla Glendia BROCKS, MD;  Location: [facility] ORS;  Service: Urology;  Laterality: Bilateral;   CYSTOSCOPY WITH BIOPSY N/A 03/29/2023   Procedure: CYSTOSCOPY WITH BLADDER BIOPSY;  Surgeon: Twylla Glendia BROCKS, MD;  Location: [facility];  Service: Urology;  Laterality: N/A;   CYSTOSCOPY WITH STENT PLACEMENT Right 03/29/2023   Procedure: CYSTOSCOPY WITH STENT PLACEMENT;  Surgeon: Twylla Glendia BROCKS, MD;  Location: [facility];  Service: Urology;  Laterality: Right;   ESOPHAGOGASTRODUODENOSCOPY (EGD) WITH PROPOFOL  N/A 07/29/2022   Procedure: ESOPHAGOGASTRODUODENOSCOPY (EGD) WITH PROPOFOL ;  Surgeon: Therisa Bi, MD;  Location: [facility] ENDOSCOPY;  Service: Gastroenterology;  Laterality: N/A;   ESOPHAGOGASTRODUODENOSCOPY (EGD) WITH PROPOFOL  N/A 01/21/2023   Procedure: ESOPHAGOGASTRODUODENOSCOPY (EGD) WITH PROPOFOL ;  Surgeon: Therisa Bi, MD;  Location: [facility] ENDOSCOPY;  Service: Gastroenterology;  Laterality: N/A;   HOLMIUM  LASER APPLICATION Right 03/29/2023   Procedure: HOLMIUM LASER ABLATION;  Surgeon: Twylla Glendia BROCKS, MD;  Location: [facility] ORS;  Service: Urology;  Laterality: Right;   IR IMAGING GUIDED PORT INSERTION  01/10/2023   TRANSURETHRAL RESECTION OF BLADDER TUMOR N/A 12/21/2022   Procedure: TRANSURETHRAL RESECTION OF BLADDER TUMOR (TURBT);  Surgeon: Twylla Glendia BROCKS, MD;  Location: [facility] ORS;  Service: Urology;  Laterality: N/A;   TRANSURETHRAL RESECTION OF BLADDER TUMOR N/A 03/29/2023   Procedure: TRANSURETHRAL RESECTION OF BLADDER TUMOR (TURBT);  Surgeon: Twylla Glendia BROCKS, MD;  Location: [facility] ORS;  Service: Urology;  Laterality: N/A;   URETEROSCOPY Right 03/29/2023   Procedure: DIAGNOSTIC  URETEROSCOPY;  Surgeon: Twylla Glendia BROCKS, MD;  Location: [facility] ORS;  Service: Urology;  Laterality: Right;    SOCIAL HISTORY: Social History   Socioeconomic History   Marital status: Widowed    Spouse name: Not on file   Number of children: Not on file   Years of education: Not on file   Highest education level: Not on file  Occupational History   Not on file  Tobacco Use   Smoking status: Every Day    Current packs/day: 0.25    Types: Cigarettes   Smokeless tobacco: Never  Vaping Use   Vaping status: Never Used  Substance and Sexual Activity   Alcohol use: No   Drug use: No   Sexual activity: Not on file  Other Topics Concern   Not on file  Social History Narrative   Not on file   Social Drivers of Health   Financial Resource Strain: Low Risk  (06/28/2022)   Overall Financial Resource Strain (CARDIA)    Difficulty of Paying Living Expenses: Not very hard  Food Insecurity: No Food Insecurity (12/22/2022)   Hunger Vital Sign    Worried About Running Out of Food in the Last Year: Never true    Ran Out of Food in the Last Year: Never true  Transportation Needs: No Transportation Needs (12/22/2022)   PRAPARE - Administrator, Civil Service (Medical): No    Lack of Transportation (Non-Medical): No  Physical Activity: Not on file  Stress: No Stress Concern Present (06/28/2022)   [employer] of Occupational Health - Occupational Stress Questionnaire    Feeling of Stress : Not at all  Social Connections: Not on file  Intimate Partner Violence: Not At Risk (12/22/2022)   Humiliation, Afraid, Rape, and Kick questionnaire    Fear of Current or Ex-Partner: No    Emotionally Abused: No    Physically Abused: No    Sexually Abused: No    FAMILY HISTORY: Family History  Problem Relation Age of Onset   Asthma Mother    Heart disease Mother    Hypertension Mother    Diabetes Mother    Stroke Mother    Diabetes Sister    Diabetes Brother     ALLERGIES:  is  allergic to ivp dye [iodinated contrast media] and shellfish allergy.  MEDICATIONS:  Current Outpatient Medications  Medication Sig Dispense Refill   albuterol  (VENTOLIN  HFA) 108 (90 Base) MCG/ACT inhaler Inhale 2 puffs into the lungs every 4 (four) hours as needed for wheezing or shortness of breath.     amLODipine  (NORVASC ) 10 MG tablet Take 10 mg by mouth every morning.     aspirin  EC 81 MG tablet Take 1 tablet (81 mg total) by mouth daily. Swallow whole. 30 tablet 12   cyanocobalamin  (VITAMIN B12) 1000 MCG tablet Take 1 tablet (1,000  mcg total) by mouth daily. 90 tablet 0   lidocaine -prilocaine  (EMLA ) cream Apply 1 Application topically as needed. Apply to port and cover with saran wrap 1-2 hours prior to port access 30 g 1   lisinopril  (ZESTRIL ) 40 MG tablet Take 40 mg by mouth every morning.     metFORMIN  (GLUCOPHAGE ) 1000 MG tablet Take 1,000 mg by mouth daily with breakfast. (Patient taking differently: Take 500 mg by mouth daily with breakfast.)     Current Facility-Administered Medications  Medication Dose Route Frequency Provider Last Rate Last Admin   bcg vaccine injection 81 mg  3.24 mL Bladder Instillation Once        Facility-Administered Medications Ordered in Other Visits  Medication Dose Route Frequency Provider Last Rate Last Admin   sodium chloride  flush (NS) 0.9 % injection 10 mL  10 mL Intracatheter PRN Babara Call, MD   10 mL at 11/30/23 1130    Review of Systems  Constitutional:  Negative for appetite change, chills, fatigue and fever.  HENT:   Negative for hearing loss and voice change.   Eyes:  Negative for eye problems.  Respiratory:  Negative for chest tightness, cough and shortness of breath.   Cardiovascular:  Negative for chest pain.  Gastrointestinal:  Negative for abdominal distention, abdominal pain and blood in stool.  Endocrine: Negative for hot flashes.  Genitourinary:  Negative for difficulty urinating and frequency.   Musculoskeletal:  Negative for  arthralgias.  Skin:  Negative for itching and rash.  Neurological:  Negative for extremity weakness.  Hematological:  Negative for adenopathy.  Psychiatric/Behavioral:  Negative for confusion.      PHYSICAL EXAMINATION: ECOG PERFORMANCE STATUS: 2 - Symptomatic, <50% confined to bed  Vitals:   11/30/23 0909 11/30/23 0914  BP: (!) 174/82 (!) 164/112  Pulse: 87   Resp: 18   Temp: 98.1 F (36.7 C)   SpO2: 100%     Filed Weights   11/30/23 0909  Weight: 111 lb 4.8 oz (50.5 kg)      Physical Exam Constitutional:      General: She is not in acute distress.    Appearance: She is not diaphoretic.  HENT:     Head: Normocephalic.  Eyes:     General: No scleral icterus.    Pupils: Pupils are equal, round, and reactive to light.  Cardiovascular:     Rate and Rhythm: Tachycardia present.     Heart sounds: No murmur heard. Pulmonary:     Effort: Pulmonary effort is normal. No respiratory distress.     Comments: Decreased breath sounds bilaterally Abdominal:     General: There is no distension.  Musculoskeletal:        General: Normal range of motion.     Cervical back: Normal range of motion.  Skin:    Findings: No erythema.  Neurological:     Mental Status: She is alert and oriented to person, place, and time. Mental status is at baseline.     Cranial Nerves: No cranial nerve deficit.     Motor: No abnormal muscle tone.  Psychiatric:        Mood and Affect: Affect normal.      LABORATORY DATA:  I have reviewed the data as listed    Latest Ref Rng & Units 11/30/2023    8:35 AM 11/09/2023    8:48 AM 10/19/2023    8:32 AM  CBC  WBC 4.0 - 10.5 K/uL 8.6  6.4  5.6   Hemoglobin 12.0 -  15.0 g/dL 89.2  88.4  89.5   Hematocrit 36.0 - 46.0 % 33.0  35.6  32.2   Platelets 150 - 400 K/uL 264  282  252       Latest Ref Rng & Units 11/30/2023    8:35 AM 11/09/2023    8:48 AM 10/19/2023    8:32 AM  CMP  Glucose 70 - 99 mg/dL 818  865  824   BUN 8 - 23 mg/dL 22  33  20    Creatinine 0.44 - 1.00 mg/dL 8.96  8.85  9.04   Sodium 135 - 145 mmol/L 138  138  140   Potassium 3.5 - 5.1 mmol/L 3.4  3.8  3.6   Chloride 98 - 111 mmol/L 103  103  104   CO2 22 - 32 mmol/L 25  25  27    Calcium  8.9 - 10.3 mg/dL 9.1  9.5  9.1   Total Protein 6.5 - 8.1 g/dL 7.1  7.8  7.1   Total Bilirubin 0.0 - 1.2 mg/dL 0.5  0.6  0.4   Alkaline Phos 38 - 126 U/L 58  63  56   AST 15 - 41 U/L 26  23  23    ALT 0 - 44 U/L 13  17  13       RADIOGRAPHIC STUDIES: I have personally reviewed the radiological images as listed and agreed with the findings in the report. No results found.

## 2023-11-30 NOTE — Assessment & Plan Note (Signed)
 Chronic anemia.  Hemoglobin is stable

## 2023-11-30 NOTE — Patient Instructions (Addendum)
 [facility] - A DEPT OF [facility]  Discharge Instructions: Thank you for choosing [facility] to provide your oncology and hematology care.  Today your potassium was low please eat foods high in potassium: Potassium is a mineral found in many foods and drinks. It can affect how the heart works, affect blood pressure, and keep fluids and electrolytes balanced in the body. It is important not to have too much potassium (hyperkalemia) or too little potassium (hypokalemia) in the body, especially in the blood. Potassium Content of Foods: Fruits Orange -- 1 medium (130 g) has 230 mg of potassium. Banana -- 1 medium (120 g) has 420 mg of potassium. Cantaloupe, chunks -- 1 cup (160 g) has 430 mg of potassium. Vegetables Potato, baked, without skin -- 1 medium (170 g) has 600 mg of potassium. Broccoli, chopped, cooked --  cup (77.5 g) has 230 mg of potassium. Tomato, chopped or sliced -- 1 cup (152 g) has 400 mg of potassium. Grains Cereal, bran with raisins -- 1 cup (59 g) has 360 mg of potassium. Granola with almonds --  cup (82 g) has 220 mg of potassium. Meats and other proteins Ground beef patty -- 4 ounces (113 g) has 240 mg of potassium. Kidney beans, boiled --  cup (130 g) has 350 mg of potassium. Almonds -- 1 ounce (approximately 22 nuts or 28 g) has 200 mg of potassium. Dairy Cow's milk, 1% -- 1 cup (237 mL) has 360 mg of potassium. Plain vanilla low-fat yogurt --  cup (184 g) has 220 mg of potassium. The items listed above may not be a complete list of foods high in potassium. Actual amounts of potassium may be different depending on ripeness, shelf life, and food preparation. Contact a dietitian for more information. Summary Potassium is a mineral found in many foods and drinks. It affects how the heart works, affects blood pressure, and keeps fluids and electrolytes balanced in the body. The amount of potassium you need each day  depends on your age and any existing medical conditions you may have. Your health care provider or dietitian may recommend an amount of potassium that you should have each day. This information is not intended to replace advice given to you by your health care provider. Make sure you discuss any questions you have with your health care provider. Document Revised: 01/13/2021 Document Reviewed: 12/25/2020 Elsevier Patient Education  2024 [employer].  If you have a lab appointment with the [facility], please go directly to the Cancer Center and check in at the registration area.  Wear comfortable clothing and clothing appropriate for easy access to any Portacath or PICC line.   We strive to give you quality time with your provider. You may need to reschedule your appointment if you arrive late (15 or more minutes).  Arriving late affects you and other patients whose appointments are after yours.  Also, if you miss three or more appointments without notifying the office, you may be dismissed from the clinic at the provider's discretion.      For prescription refill requests, have your pharmacy contact our office and allow 72 hours for refills to be completed.    Today you received the following chemotherapy and/or immunotherapy agents : Keytruda    To help prevent nausea and vomiting after your treatment, we encourage you to take your nausea medication as directed.  BELOW ARE SYMPTOMS THAT SHOULD BE REPORTED IMMEDIATELY: *FEVER GREATER THAN  100.4 F (38 C) OR HIGHER *CHILLS OR SWEATING *NAUSEA AND VOMITING THAT IS NOT CONTROLLED WITH YOUR NAUSEA MEDICATION *UNUSUAL SHORTNESS OF BREATH *UNUSUAL BRUISING OR BLEEDING *URINARY PROBLEMS (pain or burning when urinating, or frequent urination) *BOWEL PROBLEMS (unusual diarrhea, constipation, pain near the anus) TENDERNESS IN MOUTH AND THROAT WITH OR WITHOUT PRESENCE OF ULCERS (sore throat, sores in mouth, or a toothache) UNUSUAL RASH, SWELLING  OR PAIN  UNUSUAL VAGINAL DISCHARGE OR ITCHING   Items with * indicate a potential emergency and should be followed up as soon as possible or go to the Emergency Department if any problems should occur.  Please show the CHEMOTHERAPY ALERT CARD or IMMUNOTHERAPY ALERT CARD at check-in to the Emergency Department and triage nurse.  Should you have questions after your visit or need to cancel or reschedule your appointment, please contact [facility] - A DEPT OF [facility]  [phone] and follow the prompts.  Office hours are 8:00 a.m. to 4:30 p.m. Monday - Friday. Please note that voicemails left after 4:00 p.m. may not be returned until the following business day.  We are closed weekends and major holidays. You have access to a nurse at all times for urgent questions. Please call the main number to the clinic [phone] and follow the prompts.  For any non-urgent questions, you may also contact your provider using MyChart. We now offer e-Visits for anyone 33 and older to request care online for non-urgent symptoms. For details visit [email or web address].   Also download the MyChart app! Go to the app store, search MyChart, open the app, select [place], and log in with your MyChart username and password.

## 2023-11-30 NOTE — Assessment & Plan Note (Signed)
 12/21/2022 Non-muscle invasive bladder cancer. S/p TURBT, T1 lesion, high grade.  03/29/2023 Non-muscle invasive bladder cancer. S/p TURBT Patient is s/p intravesical BCG treatments-she is off  suppressive antibiotics Recommend patient to Follow-up with urology

## 2023-11-30 NOTE — Assessment & Plan Note (Signed)
 Non-small cell lung cancer, favor squamous cell carcinoma, stage IV- TPS 90%, TMB 10, KRAS G12V MRI brain with and without contrast -  Left occipital focus of prior hemorrhage with mild linear contrast enhancement. Possible small metastasis or microhemmorhagic event.  TPS 90%, CT in May 2025 showed stable disease Labs are reviewed and discussed with patient.  Proceed with Keytruda 

## 2023-12-20 ENCOUNTER — Encounter: Payer: Self-pay | Admitting: Oncology

## 2023-12-21 ENCOUNTER — Inpatient Hospital Stay

## 2023-12-21 ENCOUNTER — Encounter: Payer: Self-pay | Admitting: Oncology

## 2023-12-21 ENCOUNTER — Inpatient Hospital Stay (HOSPITAL_BASED_OUTPATIENT_CLINIC_OR_DEPARTMENT_OTHER): Admitting: Oncology

## 2023-12-21 ENCOUNTER — Other Ambulatory Visit: Payer: Self-pay

## 2023-12-21 VITALS — BP 133/60 | HR 86

## 2023-12-21 VITALS — BP 141/59 | HR 87 | Temp 97.1°F | Resp 18 | Wt 110.0 lb

## 2023-12-21 DIAGNOSIS — C3482 Malignant neoplasm of overlapping sites of left bronchus and lung: Secondary | ICD-10-CM

## 2023-12-21 DIAGNOSIS — C679 Malignant neoplasm of bladder, unspecified: Secondary | ICD-10-CM

## 2023-12-21 DIAGNOSIS — Z5112 Encounter for antineoplastic immunotherapy: Secondary | ICD-10-CM | POA: Diagnosis not present

## 2023-12-21 DIAGNOSIS — D649 Anemia, unspecified: Secondary | ICD-10-CM

## 2023-12-21 DIAGNOSIS — N179 Acute kidney failure, unspecified: Secondary | ICD-10-CM

## 2023-12-21 DIAGNOSIS — Z86711 Personal history of pulmonary embolism: Secondary | ICD-10-CM | POA: Diagnosis not present

## 2023-12-21 DIAGNOSIS — E876 Hypokalemia: Secondary | ICD-10-CM | POA: Diagnosis not present

## 2023-12-21 LAB — CMP (CANCER CENTER ONLY)
ALT: 15 U/L (ref 0–44)
AST: 25 U/L (ref 15–41)
Albumin: 3.8 g/dL (ref 3.5–5.0)
Alkaline Phosphatase: 63 U/L (ref 38–126)
Anion gap: 10 (ref 5–15)
BUN: 33 mg/dL — ABNORMAL HIGH (ref 8–23)
CO2: 23 mmol/L (ref 22–32)
Calcium: 9.1 mg/dL (ref 8.9–10.3)
Chloride: 103 mmol/L (ref 98–111)
Creatinine: 1.4 mg/dL — ABNORMAL HIGH (ref 0.44–1.00)
GFR, Estimated: 38 mL/min — ABNORMAL LOW (ref >60)
Glucose, Bld: 96 mg/dL (ref 70–99)
Potassium: 3.4 mmol/L — ABNORMAL LOW (ref 3.5–5.1)
Sodium: 136 mmol/L (ref 135–145)
Total Bilirubin: 0.5 mg/dL (ref 0.0–1.2)
Total Protein: 7.2 g/dL (ref 6.5–8.1)

## 2023-12-21 LAB — CBC WITH DIFFERENTIAL (CANCER CENTER ONLY)
Abs Immature Granulocytes: 0.02 K/uL (ref 0.00–0.07)
Basophils Absolute: 0 K/uL (ref 0.0–0.1)
Basophils Relative: 0 %
Eosinophils Absolute: 0.2 K/uL (ref 0.0–0.5)
Eosinophils Relative: 3 %
HCT: 32.8 % — ABNORMAL LOW (ref 36.0–46.0)
Hemoglobin: 10.6 g/dL — ABNORMAL LOW (ref 12.0–15.0)
Immature Granulocytes: 0 %
Lymphocytes Relative: 25 %
Lymphs Abs: 1.8 K/uL (ref 0.7–4.0)
MCH: 28.5 pg (ref 26.0–34.0)
MCHC: 32.3 g/dL (ref 30.0–36.0)
MCV: 88.2 fL (ref 80.0–100.0)
Monocytes Absolute: 0.6 K/uL (ref 0.1–1.0)
Monocytes Relative: 8 %
Neutro Abs: 4.4 K/uL (ref 1.7–7.7)
Neutrophils Relative %: 64 %
Platelet Count: 271 K/uL (ref 150–400)
RBC: 3.72 MIL/uL — ABNORMAL LOW (ref 3.87–5.11)
RDW: 16.5 % — ABNORMAL HIGH (ref 11.5–15.5)
WBC Count: 7 K/uL (ref 4.0–10.5)
nRBC: 0 % (ref 0.0–0.2)

## 2023-12-21 MED ORDER — SODIUM CHLORIDE 0.9 % IV SOLN
Freq: Once | INTRAVENOUS | Status: AC
  Filled 2023-12-21: qty 250

## 2023-12-21 MED ORDER — SODIUM CHLORIDE 0.9 % IV SOLN
200.0000 mg | Freq: Once | INTRAVENOUS | Status: AC
  Administered 2023-12-21: 200 mg via INTRAVENOUS
  Filled 2023-12-21: qty 200

## 2023-12-21 NOTE — Progress Notes (Signed)
 Nutrition Follow-up:  Patient with stage IV lung cancer.  S/p TURPT for urothelial carcinoma of bladder.  Patient receiving keytruda .  Met with patient during infusion.  Patient reports that appetite is good.  Says that she drinks ensure plus TID but yesterday only able to drink 1.  Yesterday ate ham, green beans and potato salad.  Eating cookies and drinking ensure during visit. Denies any nutrition impact symptoms  Medications: reviewed  Labs: reviewed  Anthropometrics:   Weight 110 lb  113 lb 07/27/23 112 lb on 3/12 115 lb 6.4 oz on 05/04/23 114 lb 14.4 oz on 9/25     NUTRITION DIAGNOSIS: Inadequate oral intake stable    INTERVENTION:  Continue high calorie, high protein foods Continue 350 calorie shake    MONITORING, EVALUATION, GOAL: weight trends, intake   NEXT VISIT: as needed  Regina Perkins SOLON, CSO, LDN Registered Dietitian [phone]

## 2023-12-21 NOTE — Assessment & Plan Note (Signed)
 off  Elqiuis 5mg  BID due to GI bleeding

## 2023-12-21 NOTE — Assessment & Plan Note (Signed)
 12/21/2022 Non-muscle invasive bladder cancer. S/p TURBT, T1 lesion, high grade.  03/29/2023 Non-muscle invasive bladder cancer. S/p TURBT Patient is s/p intravesical BCG treatments-she is off  suppressive antibiotics Recommend patient to Follow-up with urology

## 2023-12-21 NOTE — Assessment & Plan Note (Signed)
 Continue KCL daily

## 2023-12-21 NOTE — Assessment & Plan Note (Signed)
 Non-small cell lung cancer, favor squamous cell carcinoma, stage IV- TPS 90%, TMB 10, KRAS G12V MRI brain with and without contrast -  Left occipital focus of prior hemorrhage with mild linear contrast enhancement. Possible small metastasis or microhemmorhagic event.  TPS 90%, CT in May 2025 showed stable disease Labs are reviewed and discussed with patient.  Proceed with Keytruda 

## 2023-12-21 NOTE — Patient Instructions (Signed)

## 2023-12-21 NOTE — Progress Notes (Signed)
 Hematology/Oncology Progress note [phone] [phone]       REASON OF VISIT Stage IV Lung squamous cell carcinoma  ASSESSMENT & PLAN:   Cancer Staging  Malignant neoplasm of lung (HCC) Staging form: Lung, AJCC 8th Edition - Clinical: Stage IVA (cT4, cN3, cM1a) - Signed by Babara Call, MD on 06/28/2022  Urothelial carcinoma of bladder [facility]) Staging form: Urinary Bladder, AJCC 8th Edition - Clinical stage from 12/21/2022: Stage I (cT1, cN0, cM0) - Signed by Babara Call, MD on 12/29/2022   Encounter for antineoplastic immunotherapy Immunotherapy treatment plan as listed above.  Malignant neoplasm of lung (HCC) Non-small cell lung cancer, favor squamous cell carcinoma, stage IV- TPS 90%, TMB 10, KRAS G12V MRI brain with and without contrast -  Left occipital focus of prior hemorrhage with mild linear contrast enhancement. Possible small metastasis or microhemmorhagic event.  TPS 90%, CT in May 2025 showed stable disease Labs are reviewed and discussed with patient.  Proceed with Keytruda   History of pulmonary embolism off  Elqiuis 5mg  BID due to GI bleeding  Hypokalemia Continue KCL 10meq daily   Normocytic anemia Chronic anemia.  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  03/29/2023 Non-muscle invasive bladder cancer. S/p TURBT Patient is s/p intravesical BCG treatments-she is off  suppressive antibiotics Recommend patient to Follow-up with urology  AKI (acute kidney injury) (HCC) 1L IVF repeat BMP in 1 week   No orders of the defined types were placed in this encounter.  Follow up in 3 weeks.   All questions were answered. The patient knows to call the clinic with any problems, questions or concerns.  Call Babara, MD, PhD [facility] Hematology Oncology 12/21/2023       HISTORY OF PRESENTING ILLNESS:  Regina Perkins 83 y.o. 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 I have reviewed her chart and materials related to her cancer extensively and collaborated history with the patient. Summary of oncologic history is as follows: Oncology History  Malignant neoplasm of lung (HCC)  05/24/2022 Imaging   CT chest angiogram showed 1. No evidence for pulmonary embolism. 2. Left upper lobe/prevascular mass worrisome for neoplasm. 3. Mediastinal and hilar lymphadenopathy. 4. Multiple pulmonary nodules measuring up to 11 mm worrisome for metastatic disease. 5. Small left pleural effusion. 6. Patchy ground-glass and airspace opacities in the left upper lobe worrisome for infection. 7. Moderate-sized pericardial effusion. 8. Cholelithiasis. 9. Nonobstructing left renal calculus   05/25/2022 Imaging   CT abdomen pelvis wo contrast 1. No acute findings or explanation for the patient's symptoms. 2. No evidence of primary malignancy or metastatic disease within the abdomen or pelvis on noncontrast imaging.  3. Cholelithiasis without evidence of cholecystitis or biliary dilatation. 4. Nonobstructing left renal calculus. 5. Moderate stool throughout the colon suggesting constipation. 6.  Aortic Atherosclerosis    05/26/2022 Initial Diagnosis   Malignant neoplasm of lung - TPS 90%, TMB 10, KRAS G12V  -05/22/2022 in the emergency room, she was found to have hypoxia with oxygen levels of 86% on room air, improved to 94 with 2 L of oxygen.  D-dimer was elevated at 1.29, troponin negative.  Lower extremity Doppler was negative for DVT.  VQ scan high probability PE (Large segmental perfusion defect of the left upper lobe without corresponding radiographic abnormality. Additional bilateral wedge-shaped subsegmental branch perfusion defects .  Patient  was admitted and started on heparin  Echocardiogram showed no RV strain.  Moderate pericardial effusion. Over  her hospitalization, she continues to have shortness of breath with minimal exertion. 05/24/2022, CT chest angiogram showed left lung mass with bilateral lung nodules.   06/16/2022 Imaging   PET scan  1. 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 No evidence of metastatic disease in the abdomen or pelvis. 2. Moderate pericardial effusion. 3. Tiny left pleural effusion. 4. Left renal stone. 5. Aortic atherosclerosis (ICD10-I70.0). Coronary artery calcification. 6. 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    06/28/2022 Cancer Staging   Staging form: Lung, AJCC 8th Edition - Clinical: Stage IVA (cT4, cN3, cM1a) - Signed by Babara Call, MD on 06/28/2022   07/14/2022 -  Chemotherapy   Patient is on Treatment Plan : LUNG NSCLC Pembrolizumab  (200) q21d     09/29/2022 Imaging   CT chest abdomen pelvis w contrast showed Bilateral pulmonary nodules, some of which are waxing/waning, but favoring multifocal lung carcinoma/metastases.   Improving thoracic nodal metastases, as above.  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 Consider cystoscopy as clinically warranted.  Aortic Atherosclerosis (ICD10-I70.0) and Emphysema (ICD10-J43.9)   01/06/2023 Imaging   CT chest abdomen pelvis w contrast showed 1. Interval decrease in size of multiple spiculated and subsolid pulmonary nodules, consistent with treatment response. 2. No significant change in matted post treatment prevascular and left superior mediastinal soft tissue. 3. Diminished  size of an endoluminal bladder mass seen on prior examination, soft tissue in this vicinity now measuring no greater than 2.1 x 1.4 cm, previously at least 4.7 x 2.1 cm. Presumably this has been at least partially resected. 4. No evidence of lymphadenopathy or metastatic disease in the abdomen or pelvis. 5. Cholelithiasis. 6. Nonobstructive left nephrolithiasis. 7. Coronary artery disease.   01/10/2023 Procedure   Medi port placed by IR   05/11/2023 Imaging   CT chest abdomen pelvis w contrast showed 1. Unchanged matted, treated prevascular and left superior mediastinal soft tissue. 2. Multiple bilateral spiculated and ground-glass pulmonary nodules not significantly changed. 3. No residual mass of the posterior right aspect of the urinary bladder. Mild residual thickening in this vicinity. Interval placement of right-sided double-J ureteral stent catheter. 4. No evidence of lymphadenopathy or metastatic disease in the abdomen or pelvis. 5. Emphysema and diffuse bilateral bronchial wall thickening. 6. Coronary artery disease.   Aortic Atherosclerosis (ICD10-I70.0) and Emphysema (ICD10-J43.9).    Urothelial carcinoma of bladder (HCC)  10/06/2022 Initial Diagnosis   Urothelial carcinoma of bladder S/p TURBT  1. 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 Bladder, biopsy, Base of tumor HIGH GRADE PAPILLARY UROTHELIAL CARCINOMA SUBMUCOSA AND MUSCULARIS PROPRIA IS PRESENT AND NOT INVOLVED BY CARCINOMA  Postprocedure, patient developed gross hematuria and was admitted to the hospital.  She received PRBC transfusion.  Hemoglobin improved and she was discharged.    12/21/2022 Cancer Staging   Staging form: Urinary Bladder, AJCC 8th Edition - Clinical stage from 12/21/2022: Stage I  (cT1, cN0, cM0) - Signed  by Babara Call, MD on 12/29/2022 WHO/ISUP grade (low/high): High Grade Histologic grading system: 2 grade system     INTERVAL HISTORY Kaizley L Beegle is a 84 y.o. female who has above history reviewed by me today presents for follow up visit for Lung squamous cell carcinoma She tolerates [place] ([place]) well. Denies skin rash, diarrhea.  Patient has no new complaints.  Denies chest pain, chest palpitation. She denies headache, focal weakness or slurred speech.    MEDICAL HISTORY:  Past Medical History:  Diagnosis Date   Atherosclerosis of native arteries of extremity with intermittent claudication (HCC)    Bladder mass    Chronic kidney disease, stage 3a (HCC)    COPD (chronic obstructive pulmonary disease) (HCC)    Diabetes mellitus without complication (HCC)    Gastrointestinal hemorrhage    GERD (gastroesophageal reflux disease)    Glaucoma    History of duodenal ulcer    HTN (hypertension)    Hypertension    Hypokalemia    Hypomagnesemia    Leukocytosis    Lung mass    Malignant neoplasm of lung (HCC) 06/28/2022   a.) stage IVA (cT4, cN3, cM1a) favor squamous cell carcinoma --> TPS 90%, TMB 10, KRAS G12V   Malnutrition (HCC)    Normocytic anemia    Pericardial effusion 05/2022   Pulmonary embolism (HCC)    Sepsis (HCC)    Tobacco abuse    Type II diabetes mellitus with renal manifestations (HCC)    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   a.) stage I (cT1, cN0, cM0)    SURGICAL HISTORY: Past Surgical History:  Procedure Laterality Date   ABDOMINAL HYSTERECTOMY     BACK SURGERY     1988 and 1998   CYSTOSCOPY W/ RETROGRADES Bilateral 12/21/2022   Procedure: CYSTOSCOPY WITH RETROGRADE PYELOGRAM;  Surgeon: Twylla Glendia BROCKS, MD;  Location: [facility];  Service: Urology;  Laterality: Bilateral;   CYSTOSCOPY WITH BIOPSY N/A 03/29/2023   Procedure: CYSTOSCOPY WITH BLADDER BIOPSY;  Surgeon: Twylla Glendia BROCKS, MD;  Location: [facility];  Service: Urology;   Laterality: N/A;   CYSTOSCOPY WITH STENT PLACEMENT Right 03/29/2023   Procedure: CYSTOSCOPY WITH STENT PLACEMENT;  Surgeon: Twylla Glendia BROCKS, MD;  Location: [facility];  Service: Urology;  Laterality: Right;   ESOPHAGOGASTRODUODENOSCOPY (EGD) WITH PROPOFOL  N/A 07/29/2022   Procedure: ESOPHAGOGASTRODUODENOSCOPY (EGD) WITH PROPOFOL ;  Surgeon: Therisa Bi, MD;  Location: [facility];  Service: Gastroenterology;  Laterality: N/A;   ESOPHAGOGASTRODUODENOSCOPY (EGD) WITH PROPOFOL  N/A 01/21/2023   Procedure: ESOPHAGOGASTRODUODENOSCOPY (EGD) WITH PROPOFOL ;  Surgeon: Therisa Bi, MD;  Location[facility] ENDOSCOPY;  Service: Gastroenterology;  Laterality: N/A;   HOLMIUM LASER APPLICATION Right 03/29/2023   Procedure: HOLMIUM LASER ABLATION;  Surgeon: Twylla Glendia BROCKS, MD;  Location: [facility];  Service: Urology;  Laterality: Right;   IR IMAGING GUIDED PORT INSERTION  01/10/2023   TRANSURETHRAL RESECTION OF BLADDER TUMOR N/A 12/21/2022   Procedure: TRANSURETHRAL RESECTION OF BLADDER TUMOR (TURBT);  Surgeon: Twylla Glendia BROCKS, MD;  Location: [facility];  Service: Urology;  Laterality: N/A;   TRANSURETHRAL RESECTION OF BLADDER TUMOR N/A 03/29/2023   Procedure: TRANSURETHRAL RESECTION OF BLADDER TUMOR (TURBT);  Surgeon: Twylla Glendia BROCKS, MD;  Location: [facility];  Service: Urology;  Laterality: N/A;   URETEROSCOPY Right 03/29/2023   Procedure: DIAGNOSTIC URETEROSCOPY;  Surgeon: Twylla Glendia BROCKS, MD;  Location: [facility];  Service: Urology;  Laterality: Right;    SOCIAL HISTORY: Social History   Socioeconomic History   Marital status: Widowed    Spouse  name: Not on file   Number of children: Not on file   Years of education: Not on file   Highest education level: Not on file  Occupational History   Not on file  Tobacco Use   Smoking status: Every Day    Current packs/day: 0.25    Types: Cigarettes   Smokeless tobacco: Never  Vaping Use   Vaping status: Never Used  Substance and Sexual Activity   Alcohol use: No    Drug use: No   Sexual activity: Not on file  Other Topics Concern   Not on file  Social History Narrative   Not on file   Social Drivers of Health   Financial Resource Strain: Low Risk  (06/28/2022)   Overall Financial Resource Strain (CARDIA)    Difficulty of Paying Living Expenses: Not very hard  Food Insecurity: No Food Insecurity (12/22/2022)   Hunger Vital Sign    Worried About Running Out of Food in the Last Year: Never true    Ran Out of Food in the Last Year: Never true  Transportation Needs: No Transportation Needs (12/22/2022)   PRAPARE - Administrator, Civil Service (Medical): No    Lack of Transportation (Non-Medical): No  Physical Activity: Not on file  Stress: No Stress Concern Present (06/28/2022)   [employer] of Occupational Health - Occupational Stress Questionnaire    Feeling of Stress : Not at all  Social Connections: Not on file  Intimate Partner Violence: Not At Risk (12/22/2022)   Humiliation, Afraid, Rape, and Kick questionnaire    Fear of Current or Ex-Partner: No    Emotionally Abused: No    Physically Abused: No    Sexually Abused: No    FAMILY HISTORY: Family History  Problem Relation Age of Onset   Asthma Mother    Heart disease Mother    Hypertension Mother    Diabetes Mother    Stroke Mother    Diabetes Sister    Diabetes Brother     ALLERGIES:  is allergic to ivp dye [iodinated contrast media] and shellfish allergy.  MEDICATIONS:  Current Outpatient Medications  Medication Sig Dispense Refill   albuterol  (VENTOLIN  HFA) 108 (90 Base) MCG/ACT inhaler Inhale 2 puffs into the lungs every 4 (four) hours as needed for wheezing or shortness of breath.     amLODipine  (NORVASC ) 10 MG tablet Take 10 mg by mouth every morning.     aspirin  EC 81 MG tablet Take 1 tablet (81 mg total) by mouth daily. Swallow whole. 30 tablet 12   cyanocobalamin  (VITAMIN B12) 1000 MCG tablet Take 1 tablet (1,000 mcg total) by mouth daily. 90 tablet 0    lidocaine -prilocaine  (EMLA ) cream Apply 1 Application topically as needed. Apply to port and cover with saran wrap 1-2 hours prior to port access 30 g 1   lisinopril  (ZESTRIL ) 40 MG tablet Take 40 mg by mouth every morning.     metFORMIN  (GLUCOPHAGE ) 1000 MG tablet Take 1,000 mg by mouth daily with breakfast. (Patient taking differently: Take 500 mg by mouth daily with breakfast.)     Current Facility-Administered Medications  Medication Dose Route Frequency Provider Last Rate Last Admin   bcg vaccine injection 81 mg  3.24 mL Bladder Instillation Once         Review of Systems  Constitutional:  Negative for appetite change, chills, fatigue and fever.  HENT:   Negative for hearing loss and voice change.   Eyes:  Negative for eye problems.  Respiratory:  Negative for chest tightness, cough and shortness of breath.   Cardiovascular:  Negative for chest pain.  Gastrointestinal:  Negative for abdominal distention, abdominal pain and blood in stool.  Endocrine: Negative for hot flashes.  Genitourinary:  Negative for difficulty urinating and frequency.   Musculoskeletal:  Negative for arthralgias.  Skin:  Negative for itching and rash.  Neurological:  Negative for extremity weakness.  Hematological:  Negative for adenopathy.  Psychiatric/Behavioral:  Negative for confusion.      PHYSICAL EXAMINATION: ECOG PERFORMANCE STATUS: 2 - Symptomatic, <50% confined to bed  Vitals:   12/21/23 0913  BP: (!) 141/59  Pulse: 87  Resp: 18  Temp: (!) 97.1 F (36.2 C)  SpO2: 100%    Filed Weights   12/21/23 0913  Weight: 110 lb (49.9 kg)      Physical Exam Constitutional:      General: She is not in acute distress.    Appearance: She is not diaphoretic.  HENT:     Head: Normocephalic.  Eyes:     General: No scleral icterus.    Pupils: Pupils are equal, round, and reactive to light.  Cardiovascular:     Rate and Rhythm: Tachycardia present.     Heart sounds: No murmur  heard. Pulmonary:     Effort: Pulmonary effort is normal. No respiratory distress.     Comments: Decreased breath sounds bilaterally Abdominal:     General: There is no distension.  Musculoskeletal:        General: Normal range of motion.     Cervical back: Normal range of motion.  Skin:    Findings: No erythema.  Neurological:     Mental Status: She is alert and oriented to person, place, and time. Mental status is at baseline.     Cranial Nerves: No cranial nerve deficit.     Motor: No abnormal muscle tone.  Psychiatric:        Mood and Affect: Affect normal.      LABORATORY DATA:  I have reviewed the data as listed    Latest Ref Rng & Units 12/21/2023    9:04 AM 11/30/2023    8:35 AM 11/09/2023    8:48 AM  CBC  WBC 4.0 - 10.5 K/uL 7.0  8.6  6.4   Hemoglobin 12.0 - 15.0 g/dL 89.3  89.2  88.4   Hematocrit 36.0 - 46.0 % 32.8  33.0  35.6   Platelets 150 - 400 K/uL 271  264  282       Latest Ref Rng & Units 12/21/2023    9:04 AM 11/30/2023    8:35 AM 11/09/2023    8:48 AM  CMP  Glucose 70 - 99 mg/dL 96  818  865   BUN 8 - 23 mg/dL 33  22  33   Creatinine 0.44 - 1.00 mg/dL 8.59  8.96  8.85   Sodium 135 - 145 mmol/L 136  138  138   Potassium 3.5 - 5.1 mmol/L 3.4  3.4  3.8   Chloride 98 - 111 mmol/L 103  103  103   CO2 22 - 32 mmol/L 23  25  25    Calcium  8.9 - 10.3 mg/dL 9.1  9.1  9.5   Total Protein 6.5 - 8.1 g/dL 7.2  7.1  7.8   Total Bilirubin 0.0 - 1.2 mg/dL 0.5  0.5  0.6   Alkaline Phos 38 - 126 U/L 63  58  63   AST 15 - 41 U/L  25  26  23    ALT 0 - 44 U/L 15  13  17       RADIOGRAPHIC STUDIES: I have personally reviewed the radiological images as listed and agreed with the findings in the report. No results found.

## 2023-12-21 NOTE — Assessment & Plan Note (Signed)
 Chronic anemia.  Hemoglobin is stable

## 2023-12-21 NOTE — Assessment & Plan Note (Signed)
 Immunotherapy treatment plan as listed above.

## 2023-12-21 NOTE — Assessment & Plan Note (Signed)
 1L IVF repeat BMP in 1 week

## 2023-12-28 ENCOUNTER — Inpatient Hospital Stay

## 2023-12-28 ENCOUNTER — Inpatient Hospital Stay: Attending: Oncology

## 2023-12-28 ENCOUNTER — Other Ambulatory Visit

## 2023-12-28 VITALS — BP 140/70 | HR 80 | Temp 97.7°F | Resp 16

## 2023-12-28 DIAGNOSIS — E860 Dehydration: Secondary | ICD-10-CM | POA: Insufficient documentation

## 2023-12-28 DIAGNOSIS — Z23 Encounter for immunization: Secondary | ICD-10-CM | POA: Diagnosis not present

## 2023-12-28 DIAGNOSIS — Z7982 Long term (current) use of aspirin: Secondary | ICD-10-CM | POA: Diagnosis not present

## 2023-12-28 DIAGNOSIS — Z5112 Encounter for antineoplastic immunotherapy: Secondary | ICD-10-CM | POA: Insufficient documentation

## 2023-12-28 DIAGNOSIS — Z79899 Other long term (current) drug therapy: Secondary | ICD-10-CM | POA: Insufficient documentation

## 2023-12-28 DIAGNOSIS — Z86711 Personal history of pulmonary embolism: Secondary | ICD-10-CM | POA: Insufficient documentation

## 2023-12-28 DIAGNOSIS — C3482 Malignant neoplasm of overlapping sites of left bronchus and lung: Secondary | ICD-10-CM

## 2023-12-28 DIAGNOSIS — Z8551 Personal history of malignant neoplasm of bladder: Secondary | ICD-10-CM | POA: Diagnosis not present

## 2023-12-28 DIAGNOSIS — C3412 Malignant neoplasm of upper lobe, left bronchus or lung: Secondary | ICD-10-CM | POA: Diagnosis present

## 2023-12-28 DIAGNOSIS — C787 Secondary malignant neoplasm of liver and intrahepatic bile duct: Secondary | ICD-10-CM | POA: Insufficient documentation

## 2023-12-28 LAB — BASIC METABOLIC PANEL - CANCER CENTER ONLY
Anion gap: 10 (ref 5–15)
BUN: 21 mg/dL (ref 8–23)
CO2: 28 mmol/L (ref 22–32)
Calcium: 9.3 mg/dL (ref 8.9–10.3)
Chloride: 98 mmol/L (ref 98–111)
Creatinine: 1.12 mg/dL — ABNORMAL HIGH (ref 0.44–1.00)
GFR, Estimated: 49 mL/min — ABNORMAL LOW (ref >60)
Glucose, Bld: 151 mg/dL — ABNORMAL HIGH (ref 70–99)
Potassium: 3.6 mmol/L (ref 3.5–5.1)
Sodium: 136 mmol/L (ref 135–145)

## 2023-12-28 MED ORDER — SODIUM CHLORIDE 0.9 % IV SOLN
Freq: Once | INTRAVENOUS | Status: AC
  Filled 2023-12-28: qty 250

## 2024-01-11 ENCOUNTER — Inpatient Hospital Stay

## 2024-01-11 ENCOUNTER — Telehealth: Payer: Self-pay

## 2024-01-11 ENCOUNTER — Inpatient Hospital Stay (HOSPITAL_BASED_OUTPATIENT_CLINIC_OR_DEPARTMENT_OTHER): Admitting: Oncology

## 2024-01-11 ENCOUNTER — Encounter: Payer: Self-pay | Admitting: Oncology

## 2024-01-11 VITALS — BP 147/74 | HR 85 | Temp 96.4°F | Resp 18 | Wt 111.5 lb

## 2024-01-11 VITALS — BP 142/73 | HR 99 | Temp 97.1°F | Resp 19

## 2024-01-11 DIAGNOSIS — C679 Malignant neoplasm of bladder, unspecified: Secondary | ICD-10-CM

## 2024-01-11 DIAGNOSIS — D649 Anemia, unspecified: Secondary | ICD-10-CM | POA: Diagnosis not present

## 2024-01-11 DIAGNOSIS — C3482 Malignant neoplasm of overlapping sites of left bronchus and lung: Secondary | ICD-10-CM

## 2024-01-11 DIAGNOSIS — E876 Hypokalemia: Secondary | ICD-10-CM | POA: Diagnosis not present

## 2024-01-11 DIAGNOSIS — Z5112 Encounter for antineoplastic immunotherapy: Secondary | ICD-10-CM

## 2024-01-11 DIAGNOSIS — Z23 Encounter for immunization: Secondary | ICD-10-CM | POA: Insufficient documentation

## 2024-01-11 LAB — CBC WITH DIFFERENTIAL (CANCER CENTER ONLY)
Abs Immature Granulocytes: 0.02 K/uL (ref 0.00–0.07)
Basophils Absolute: 0 K/uL (ref 0.0–0.1)
Basophils Relative: 0 %
Eosinophils Absolute: 0.3 K/uL (ref 0.0–0.5)
Eosinophils Relative: 5 %
HCT: 34.4 % — ABNORMAL LOW (ref 36.0–46.0)
Hemoglobin: 11.1 g/dL — ABNORMAL LOW (ref 12.0–15.0)
Immature Granulocytes: 0 %
Lymphocytes Relative: 29 %
Lymphs Abs: 1.8 K/uL (ref 0.7–4.0)
MCH: 28.4 pg (ref 26.0–34.0)
MCHC: 32.3 g/dL (ref 30.0–36.0)
MCV: 88 fL (ref 80.0–100.0)
Monocytes Absolute: 0.4 K/uL (ref 0.1–1.0)
Monocytes Relative: 7 %
Neutro Abs: 3.8 K/uL (ref 1.7–7.7)
Neutrophils Relative %: 59 %
Platelet Count: 294 K/uL (ref 150–400)
RBC: 3.91 MIL/uL (ref 3.87–5.11)
RDW: 17 % — ABNORMAL HIGH (ref 11.5–15.5)
WBC Count: 6.4 K/uL (ref 4.0–10.5)
nRBC: 0 % (ref 0.0–0.2)

## 2024-01-11 LAB — CMP (CANCER CENTER ONLY)
ALT: 14 U/L (ref 0–44)
AST: 28 U/L (ref 15–41)
Albumin: 4 g/dL (ref 3.5–5.0)
Alkaline Phosphatase: 66 U/L (ref 38–126)
Anion gap: 9 (ref 5–15)
BUN: 15 mg/dL (ref 8–23)
CO2: 27 mmol/L (ref 22–32)
Calcium: 9.5 mg/dL (ref 8.9–10.3)
Chloride: 101 mmol/L (ref 98–111)
Creatinine: 0.97 mg/dL (ref 0.44–1.00)
GFR, Estimated: 58 mL/min — ABNORMAL LOW (ref >60)
Glucose, Bld: 140 mg/dL — ABNORMAL HIGH (ref 70–99)
Potassium: 3.2 mmol/L — ABNORMAL LOW (ref 3.5–5.1)
Sodium: 137 mmol/L (ref 135–145)
Total Bilirubin: 0.4 mg/dL (ref 0.0–1.2)
Total Protein: 7.6 g/dL (ref 6.5–8.1)

## 2024-01-11 LAB — TSH: TSH: 1.918 u[IU]/mL (ref 0.350–4.500)

## 2024-01-11 MED ORDER — SODIUM CHLORIDE 0.9 % IV SOLN
200.0000 mg | Freq: Once | INTRAVENOUS | Status: AC
  Administered 2024-01-11: 200 mg via INTRAVENOUS
  Filled 2024-01-11: qty 8

## 2024-01-11 MED ORDER — INFLUENZA VAC A&B SURF ANT ADJ 0.5 ML IM SUSY: 0.5000 mL | PREFILLED_SYRINGE | Freq: Once | INTRAMUSCULAR | Status: DC

## 2024-01-11 MED ORDER — INFLUENZA VAC SPLIT HIGH-DOSE 0.5 ML IM SUSY
0.5000 mL | PREFILLED_SYRINGE | INTRAMUSCULAR | Status: AC
  Administered 2024-01-11: 0.5 mL via INTRAMUSCULAR
  Filled 2024-01-11: qty 0.5

## 2024-01-11 MED ORDER — SODIUM CHLORIDE 0.9 % IV SOLN
Freq: Once | INTRAVENOUS | Status: AC
  Filled 2024-01-11: qty 250

## 2024-01-11 MED ORDER — INFLUENZA VIRUS VACC SPLIT PF (FLUZONE) 0.5 ML IM SUSY: 0.5000 mL | PREFILLED_SYRINGE | Freq: Once | INTRAMUSCULAR | Status: DC

## 2024-01-11 NOTE — Assessment & Plan Note (Signed)
 Chronic anemia.  Hemoglobin is stable

## 2024-01-11 NOTE — Progress Notes (Signed)
 Hematology/Oncology Progress note [phone] [phone]       REASON OF VISIT Stage IV Lung squamous cell carcinoma  ASSESSMENT & PLAN:   Cancer Staging  Malignant neoplasm of lung (HCC) Staging form: Lung, AJCC 8th Edition - Clinical: Stage IVA (cT4, cN3, cM1a) - Signed by Babara Call, MD on 06/28/2022  Urothelial carcinoma of bladder [facility]) Staging form: Urinary Bladder, AJCC 8th Edition - Clinical stage from 12/21/2022: Stage I (cT1, cN0, cM0) - Signed by Babara Call, MD on 12/29/2022   Malignant neoplasm of lung (HCC) Non-small cell lung cancer, favor squamous cell carcinoma, stage IV- TPS 90%, TMB 10, KRAS G12V MRI brain with and without contrast -  Left occipital focus of prior hemorrhage with mild linear contrast enhancement. Possible small metastasis or microhemmorhagic event.  TPS 90%, CT in May 2025 showed stable disease Labs are reviewed and discussed with patient.  Proceed with Keytruda  Plan to repeat CT in Oct.   Encounter for antineoplastic immunotherapy Immunotherapy treatment plan as listed above.  Hypokalemia off KCL 10meq daily Recommend potassium rich food  Normocytic anemia Chronic anemia.  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  03/29/2023 Non-muscle invasive bladder cancer. S/p TURBT Patient is s/p intravesical BCG treatments-she is off  suppressive antibiotics Recommend patient to Follow-up with urology  Need for prophylactic vaccination and inoculation against influenza Influenza vaccination today   Orders Placed This Encounter  Procedures   CT CHEST ABDOMEN PELVIS WO CONTRAST    Standing Status:   Future    Expected Date:   02/15/2024    Expiration Date:   01/10/2025    Preferred imaging location?:   [facility]    If indicated for the ordered procedure, I authorize the administration of oral contrast media per Radiology  protocol:   Yes    Does the patient have a contrast media/X-ray dye allergy?:   Yes   Follow up in 3 weeks.   All questions were answered. The patient knows to call the clinic with any problems, questions or concerns.  Call Babara, MD, PhD [facility] Hematology Oncology 01/11/2024       HISTORY OF PRESENTING ILLNESS:  Regina Perkins 83 y.o. 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 I have reviewed her chart and materials related to her cancer extensively and collaborated history with the patient. Summary of oncologic history is as follows: Oncology History  Malignant neoplasm of lung (HCC)  05/24/2022 Imaging   CT chest angiogram showed 1. No evidence for pulmonary embolism. 2. Left upper lobe/prevascular mass worrisome for neoplasm. 3. Mediastinal and hilar lymphadenopathy. 4. Multiple pulmonary nodules measuring up to 11 mm worrisome for metastatic disease. 5. Small left pleural effusion. 6. Patchy ground-glass and airspace opacities in the left upper lobe worrisome for infection. 7. Moderate-sized pericardial effusion. 8. Cholelithiasis. 9. Nonobstructing left renal calculus   05/25/2022 Imaging   CT abdomen pelvis wo contrast 1. No acute findings or explanation for the patient's symptoms. 2. No evidence of primary malignancy or metastatic disease within the abdomen or pelvis on noncontrast imaging.  3. Cholelithiasis without evidence of cholecystitis or biliary dilatation. 4. Nonobstructing left renal calculus. 5. Moderate stool throughout the colon suggesting constipation. 6.  Aortic Atherosclerosis    05/26/2022 Initial Diagnosis   Malignant neoplasm of lung - TPS 90%, TMB 10, KRAS G12V  -05/22/2022  in the emergency room, she was found to have hypoxia with oxygen levels of 86% on room air, improved to 94 with 2 L of oxygen.  D-dimer was elevated at 1.29, troponin negative.   Lower extremity Doppler was negative for DVT.  VQ scan high probability PE (Large segmental perfusion defect of the left upper lobe without corresponding radiographic abnormality. Additional bilateral wedge-shaped subsegmental branch perfusion defects .  Patient was admitted and started on heparin  Echocardiogram showed no RV strain.  Moderate pericardial effusion. Over her hospitalization, she continues to have shortness of breath with minimal exertion. 05/24/2022, CT chest angiogram showed left lung mass with bilateral lung nodules.   06/16/2022 Imaging   PET scan  1. 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 No evidence of metastatic disease in the abdomen or pelvis. 2. Moderate pericardial effusion. 3. Tiny left pleural effusion. 4. Left renal stone. 5. Aortic atherosclerosis (ICD10-I70.0). Coronary artery calcification. 6. 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    06/28/2022 Cancer Staging   Staging form: Lung, AJCC 8th Edition - Clinical: Stage IVA (cT4, cN3, cM1a) - Signed by Babara Call, MD on 06/28/2022   07/14/2022 -  Chemotherapy   Patient is on Treatment Plan : LUNG NSCLC Pembrolizumab  (200) q21d     09/29/2022 Imaging   CT chest abdomen pelvis w contrast showed Bilateral pulmonary nodules, some of which are waxing/waning, but favoring multifocal lung carcinoma/metastases.   Improving thoracic nodal metastases, as above.  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 Consider cystoscopy as clinically warranted.   Aortic Atherosclerosis (ICD10-I70.0) and Emphysema (ICD10-J43.9)   01/06/2023 Imaging   CT chest abdomen pelvis w contrast showed 1. Interval decrease in size of multiple spiculated and subsolid pulmonary nodules, consistent with treatment response. 2. No significant change in matted post treatment prevascular and left superior mediastinal soft tissue. 3. Diminished size of an endoluminal bladder mass seen on prior examination, soft tissue in this vicinity now measuring no greater than 2.1 x 1.4 cm, previously at least 4.7 x 2.1 cm. Presumably this has been at least partially resected. 4. No evidence of lymphadenopathy or metastatic disease in the abdomen or pelvis. 5. Cholelithiasis. 6. Nonobstructive left nephrolithiasis. 7. Coronary artery disease.   01/10/2023 Procedure   Medi port placed by IR   05/11/2023 Imaging   CT chest abdomen pelvis w contrast showed 1. Unchanged matted, treated prevascular and left superior mediastinal soft tissue. 2. Multiple bilateral spiculated and ground-glass pulmonary nodules not significantly changed. 3. No residual mass of the posterior right aspect of the urinary bladder. Mild residual thickening in this vicinity. Interval placement of right-sided double-J ureteral stent catheter. 4. No evidence of lymphadenopathy or metastatic disease in the abdomen or pelvis. 5. Emphysema and diffuse bilateral bronchial wall thickening. 6. Coronary artery disease.   Aortic Atherosclerosis (ICD10-I70.0) and Emphysema (ICD10-J43.9).    Urothelial carcinoma of bladder (HCC)  10/06/2022 Initial Diagnosis   Urothelial carcinoma of bladder S/p TURBT  1. 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 Bladder, biopsy,  Base of tumor HIGH GRADE PAPILLARY  UROTHELIAL CARCINOMA SUBMUCOSA AND MUSCULARIS PROPRIA IS PRESENT AND NOT INVOLVED BY CARCINOMA  Postprocedure, patient developed gross hematuria and was admitted to the hospital.  She received PRBC transfusion.  Hemoglobin improved and she was discharged.    12/21/2022 Cancer Staging   Staging form: Urinary Bladder, AJCC 8th Edition - Clinical stage from 12/21/2022: Stage I (cT1, cN0, cM0) - Signed by Babara Call, MD on 12/29/2022 WHO/ISUP grade (low/high): High Grade Histologic grading system: 2 grade system     INTERVAL HISTORY Regina Perkins is a 83 y.o. female who has above history reviewed by me today presents for follow up visit for Lung squamous cell carcinoma She tolerates [place]) well. Denies skin rash, diarrhea.  Patient has no new complaints.  Denies chest pain, chest palpitation. She denies headache, focal weakness or slurred speech.    MEDICAL HISTORY:  Past Medical History:  Diagnosis Date   Atherosclerosis of native arteries of extremity with intermittent claudication (HCC)    Bladder mass    Chronic kidney disease, stage 3a (HCC)    COPD (chronic obstructive pulmonary disease) (HCC)    Diabetes mellitus without complication (HCC)    Gastrointestinal hemorrhage    GERD (gastroesophageal reflux disease)    Glaucoma    History of duodenal ulcer    HTN (hypertension)    Hypertension    Hypokalemia    Hypomagnesemia    Leukocytosis    Lung mass    Malignant neoplasm of lung (HCC) 06/28/2022   a.) stage IVA (cT4, cN3, cM1a) favor squamous cell carcinoma --> TPS 90%, TMB 10, KRAS G12V   Malnutrition (HCC)    Normocytic anemia    Pericardial effusion 05/2022   Pulmonary embolism (HCC)    Sepsis (HCC)    Tobacco abuse    Type II diabetes mellitus with renal manifestations (HCC)    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   a.) stage I (cT1, cN0, cM0)    SURGICAL HISTORY: Past Surgical History:  Procedure Laterality Date   ABDOMINAL HYSTERECTOMY     BACK  SURGERY     1988 and 1998   CYSTOSCOPY W/ RETROGRADES Bilateral 12/21/2022   Procedure: CYSTOSCOPY WITH RETROGRADE PYELOGRAM;  Surgeon: Twylla Glendia BROCKS, MD;  Location: [facility];  Service: Urology;  Laterality: Bilateral;   CYSTOSCOPY WITH BIOPSY N/A 03/29/2023   Procedure: CYSTOSCOPY WITH BLADDER BIOPSY;  Surgeon: Twylla Glendia BROCKS, MD;  Location: [facility];  Service: Urology;  Laterality: N/A;   CYSTOSCOPY WITH STENT PLACEMENT Right 03/29/2023   Procedure: CYSTOSCOPY WITH STENT PLACEMENT;  Surgeon: Twylla Glendia BROCKS, MD;  Location: [facility];  Service: Urology;  Laterality: Right;   ESOPHAGOGASTRODUODENOSCOPY (EGD) WITH PROPOFOL  N/A 07/29/2022   Procedure: ESOPHAGOGASTRODUODENOSCOPY (EGD) WITH PROPOFOL ;  Surgeon: Therisa Bi, MD;  Location: [facility] ENDOSCOPY;  Service: Gastroenterology;  Laterality: N/A;   ESOPHAGOGASTRODUODENOSCOPY (EGD) WITH PROPOFOL  N/A 01/21/2023   Procedure: ESOPHAGOGASTRODUODENOSCOPY (EGD) WITH PROPOFOL ;  Surgeon: Therisa Bi, MD;  Location: [facility];  Service: Gastroenterology;  Laterality: N/A;   HOLMIUM LASER APPLICATION Right 03/29/2023   Procedure: HOLMIUM LASER ABLATION;  Surgeon: Twylla Glendia BROCKS, MD;  Location: [facility];  Service: Urology;  Laterality: Right;   IR IMAGING GUIDED PORT INSERTION  01/10/2023   TRANSURETHRAL RESECTION OF BLADDER TUMOR N/A 12/21/2022   Procedure: TRANSURETHRAL RESECTION OF BLADDER TUMOR (TURBT);  Surgeon: Twylla Glendia BROCKS, MD;  Location: [facility];  Service: Urology;  Laterality: N/A;   TRANSURETHRAL RESECTION OF BLADDER TUMOR N/A 03/29/2023  Procedure: TRANSURETHRAL RESECTION OF BLADDER TUMOR (TURBT);  Surgeon: Twylla Glendia BROCKS, MD;  Location: [facility];  Service: Urology;  Laterality: N/A;   URETEROSCOPY Right 03/29/2023   Procedure: DIAGNOSTIC URETEROSCOPY;  Surgeon: Twylla Glendia BROCKS, MD;  Location: [facility];  Service: Urology;  Laterality: Right;    SOCIAL HISTORY: Social History   Socioeconomic History   Marital status: Widowed     Spouse name: Not on file   Number of children: Not on file   Years of education: Not on file   Highest education level: Not on file  Occupational History   Not on file  Tobacco Use   Smoking status: Every Day    Current packs/day: 0.25    Types: Cigarettes   Smokeless tobacco: Never  Vaping Use   Vaping status: Never Used  Substance and Sexual Activity   Alcohol use: No   Drug use: No   Sexual activity: Not on file  Other Topics Concern   Not on file  Social History Narrative   Not on file   Social Drivers of Health   Financial Resource Strain: Low Risk  (06/28/2022)   Overall Financial Resource Strain (CARDIA)    Difficulty of Paying Living Expenses: Not very hard  Food Insecurity: No Food Insecurity (12/22/2022)   Hunger Vital Sign    Worried About Running Out of Food in the Last Year: Never true    Ran Out of Food in the Last Year: Never true  Transportation Needs: No Transportation Needs (12/22/2022)   PRAPARE - Administrator, Civil Service (Medical): No    Lack of Transportation (Non-Medical): No  Physical Activity: Not on file  Stress: No Stress Concern Present (06/28/2022)   [employer] of Occupational Health - Occupational Stress Questionnaire    Feeling of Stress : Not at all  Social Connections: Not on file  Intimate Partner Violence: Not At Risk (12/22/2022)   Humiliation, Afraid, Rape, and Kick questionnaire    Fear of Current or Ex-Partner: No    Emotionally Abused: No    Physically Abused: No    Sexually Abused: No    FAMILY HISTORY: Family History  Problem Relation Age of Onset   Asthma Mother    Heart disease Mother    Hypertension Mother    Diabetes Mother    Stroke Mother    Diabetes Sister    Diabetes Brother     ALLERGIES:  is allergic to ivp dye [iodinated contrast media] and shellfish allergy.  MEDICATIONS:  Current Outpatient Medications  Medication Sig Dispense Refill   albuterol  (VENTOLIN  HFA) 108 (90 Base) MCG/ACT  inhaler Inhale 2 puffs into the lungs every 4 (four) hours as needed for wheezing or shortness of breath.     amLODipine  (NORVASC ) 10 MG tablet Take 10 mg by mouth every morning.     aspirin  EC 81 MG tablet Take 1 tablet (81 mg total) by mouth daily. Swallow whole. 30 tablet 12   cyanocobalamin  (VITAMIN B12) 1000 MCG tablet Take 1 tablet (1,000 mcg total) by mouth daily. 90 tablet 0   lidocaine -prilocaine  (EMLA ) cream Apply 1 Application topically as needed. Apply to port and cover with saran wrap 1-2 hours prior to port access 30 g 1   lisinopril  (ZESTRIL ) 40 MG tablet Take 40 mg by mouth every morning.     metFORMIN  (GLUCOPHAGE ) 1000 MG tablet Take 1,000 mg by mouth daily with breakfast. (Patient taking differently: Take 500 mg by mouth daily with breakfast.)  Current Facility-Administered Medications  Medication Dose Route Frequency Provider Last Rate Last Admin   bcg vaccine injection 81 mg  3.24 mL Bladder Instillation Once         Review of Systems  Constitutional:  Negative for appetite change, chills, fatigue and fever.  HENT:   Negative for hearing loss and voice change.   Eyes:  Negative for eye problems.  Respiratory:  Negative for chest tightness, cough and shortness of breath.   Cardiovascular:  Negative for chest pain.  Gastrointestinal:  Negative for abdominal distention, abdominal pain and blood in stool.  Endocrine: Negative for hot flashes.  Genitourinary:  Negative for difficulty urinating and frequency.   Musculoskeletal:  Negative for arthralgias.  Skin:  Negative for itching and rash.  Neurological:  Negative for extremity weakness.  Hematological:  Negative for adenopathy.  Psychiatric/Behavioral:  Negative for confusion.      PHYSICAL EXAMINATION: ECOG PERFORMANCE STATUS: 2 - Symptomatic, <50% confined to bed  Vitals:   01/11/24 0911 01/11/24 0918  BP: (!) 157/73 (!) 147/74  Pulse: 85   Resp: 18   Temp: (!) 96.4 F (35.8 C)   SpO2: 100%     Filed  Weights   01/11/24 0911  Weight: 111 lb 8 oz (50.6 kg)      Physical Exam Constitutional:      General: She is not in acute distress.    Appearance: She is not diaphoretic.  HENT:     Head: Normocephalic.  Eyes:     General: No scleral icterus.    Pupils: Pupils are equal, round, and reactive to light.  Cardiovascular:     Rate and Rhythm: Tachycardia present.     Heart sounds: No murmur heard. Pulmonary:     Effort: Pulmonary effort is normal. No respiratory distress.     Comments: Decreased breath sounds bilaterally Abdominal:     General: There is no distension.  Musculoskeletal:        General: Normal range of motion.     Cervical back: Normal range of motion.  Skin:    Findings: No erythema.  Neurological:     Mental Status: She is alert and oriented to person, place, and time. Mental status is at baseline.     Cranial Nerves: No cranial nerve deficit.     Motor: No abnormal muscle tone.  Psychiatric:        Mood and Affect: Affect normal.      LABORATORY DATA:  I have reviewed the data as listed    Latest Ref Rng & Units 01/11/2024    8:50 AM 12/21/2023    9:04 AM 11/30/2023    8:35 AM  CBC  WBC 4.0 - 10.5 K/uL 6.4  7.0  8.6   Hemoglobin 12.0 - 15.0 g/dL 88.8  89.3  89.2   Hematocrit 36.0 - 46.0 % 34.4  32.8  33.0   Platelets 150 - 400 K/uL 294  271  264       Latest Ref Rng & Units 01/11/2024    8:50 AM 12/28/2023    9:36 AM 12/21/2023    9:04 AM  CMP  Glucose 70 - 99 mg/dL 859  848  96   BUN 8 - 23 mg/dL 15  21  33   Creatinine 0.44 - 1.00 mg/dL 9.02  8.87  8.59   Sodium 135 - 145 mmol/L 137  136  136   Potassium 3.5 - 5.1 mmol/L 3.2  3.6  3.4   Chloride 98 -  111 mmol/L 101  98  103   CO2 22 - 32 mmol/L 27  28  23    Calcium  8.9 - 10.3 mg/dL 9.5  9.3  9.1   Total Protein 6.5 - 8.1 g/dL 7.6   7.2   Total Bilirubin 0.0 - 1.2 mg/dL 0.4   0.5   Alkaline Phos 38 - 126 U/L 66   63   AST 15 - 41 U/L 28   25   ALT 0 - 44 U/L 14   15      RADIOGRAPHIC  STUDIES: I have personally reviewed the radiological images as listed and agreed with the findings in the report. No results found.

## 2024-01-11 NOTE — Assessment & Plan Note (Signed)
Influenza vaccination today 

## 2024-01-11 NOTE — Assessment & Plan Note (Signed)
 12/21/2022 Non-muscle invasive bladder cancer. S/p TURBT, T1 lesion, high grade.  03/29/2023 Non-muscle invasive bladder cancer. S/p TURBT Patient is s/p intravesical BCG treatments-she is off  suppressive antibiotics Recommend patient to Follow-up with urology

## 2024-01-11 NOTE — Assessment & Plan Note (Addendum)
 off KCL 10meq daily Recommend potassium rich food

## 2024-01-11 NOTE — Telephone Encounter (Signed)
 Per Dr. Babara  potassium is low recommended her to take potassium rich food thanks. Pt notified.

## 2024-01-11 NOTE — Assessment & Plan Note (Signed)
 Immunotherapy treatment plan as listed above.

## 2024-01-11 NOTE — Assessment & Plan Note (Addendum)
 Non-small cell lung cancer, favor squamous cell carcinoma, stage IV- TPS 90%, TMB 10, KRAS G12V MRI brain with and without contrast -  Left occipital focus of prior hemorrhage with mild linear contrast enhancement. Possible small metastasis or microhemmorhagic event.  TPS 90%, CT in May 2025 showed stable disease Labs are reviewed and discussed with patient.  Proceed with Keytruda  Plan to repeat CT in Oct.

## 2024-01-12 LAB — T4: T4, Total: 10 ug/dL (ref 4.5–12.0)

## 2024-01-13 ENCOUNTER — Telehealth: Payer: Self-pay | Admitting: Oncology

## 2024-01-13 ENCOUNTER — Encounter: Payer: Self-pay | Admitting: Oncology

## 2024-01-13 NOTE — Telephone Encounter (Signed)
 Called pt to set up CT appt - vm is full - LH

## 2024-02-01 ENCOUNTER — Inpatient Hospital Stay: Attending: Oncology

## 2024-02-01 ENCOUNTER — Inpatient Hospital Stay

## 2024-02-01 ENCOUNTER — Encounter: Payer: Self-pay | Admitting: Oncology

## 2024-02-01 ENCOUNTER — Inpatient Hospital Stay (HOSPITAL_BASED_OUTPATIENT_CLINIC_OR_DEPARTMENT_OTHER): Admitting: Oncology

## 2024-02-01 VITALS — BP 134/59 | HR 85

## 2024-02-01 VITALS — BP 145/88 | HR 91 | Temp 96.9°F | Resp 18 | Wt 111.0 lb

## 2024-02-01 DIAGNOSIS — E1122 Type 2 diabetes mellitus with diabetic chronic kidney disease: Secondary | ICD-10-CM | POA: Diagnosis not present

## 2024-02-01 DIAGNOSIS — C771 Secondary and unspecified malignant neoplasm of intrathoracic lymph nodes: Secondary | ICD-10-CM | POA: Diagnosis not present

## 2024-02-01 DIAGNOSIS — C3412 Malignant neoplasm of upper lobe, left bronchus or lung: Secondary | ICD-10-CM | POA: Diagnosis present

## 2024-02-01 DIAGNOSIS — F17210 Nicotine dependence, cigarettes, uncomplicated: Secondary | ICD-10-CM | POA: Insufficient documentation

## 2024-02-01 DIAGNOSIS — I129 Hypertensive chronic kidney disease with stage 1 through stage 4 chronic kidney disease, or unspecified chronic kidney disease: Secondary | ICD-10-CM | POA: Diagnosis not present

## 2024-02-01 DIAGNOSIS — Z720 Tobacco use: Secondary | ICD-10-CM

## 2024-02-01 DIAGNOSIS — Z7984 Long term (current) use of oral hypoglycemic drugs: Secondary | ICD-10-CM | POA: Insufficient documentation

## 2024-02-01 DIAGNOSIS — C679 Malignant neoplasm of bladder, unspecified: Secondary | ICD-10-CM

## 2024-02-01 DIAGNOSIS — C787 Secondary malignant neoplasm of liver and intrahepatic bile duct: Secondary | ICD-10-CM | POA: Diagnosis present

## 2024-02-01 DIAGNOSIS — N1831 Chronic kidney disease, stage 3a: Secondary | ICD-10-CM | POA: Insufficient documentation

## 2024-02-01 DIAGNOSIS — Z5112 Encounter for antineoplastic immunotherapy: Secondary | ICD-10-CM | POA: Diagnosis present

## 2024-02-01 DIAGNOSIS — C3482 Malignant neoplasm of overlapping sites of left bronchus and lung: Secondary | ICD-10-CM | POA: Diagnosis not present

## 2024-02-01 DIAGNOSIS — Z79899 Other long term (current) drug therapy: Secondary | ICD-10-CM | POA: Diagnosis not present

## 2024-02-01 DIAGNOSIS — D649 Anemia, unspecified: Secondary | ICD-10-CM | POA: Insufficient documentation

## 2024-02-01 DIAGNOSIS — Z7982 Long term (current) use of aspirin: Secondary | ICD-10-CM | POA: Diagnosis not present

## 2024-02-01 LAB — CBC WITH DIFFERENTIAL (CANCER CENTER ONLY)
Abs Immature Granulocytes: 0.02 K/uL (ref 0.00–0.07)
Basophils Absolute: 0 K/uL (ref 0.0–0.1)
Basophils Relative: 0 %
Eosinophils Absolute: 0.3 K/uL (ref 0.0–0.5)
Eosinophils Relative: 4 %
HCT: 35.1 % — ABNORMAL LOW (ref 36.0–46.0)
Hemoglobin: 11.4 g/dL — ABNORMAL LOW (ref 12.0–15.0)
Immature Granulocytes: 0 %
Lymphocytes Relative: 25 %
Lymphs Abs: 1.8 K/uL (ref 0.7–4.0)
MCH: 28.2 pg (ref 26.0–34.0)
MCHC: 32.5 g/dL (ref 30.0–36.0)
MCV: 86.9 fL (ref 80.0–100.0)
Monocytes Absolute: 0.5 K/uL (ref 0.1–1.0)
Monocytes Relative: 7 %
Neutro Abs: 4.6 K/uL (ref 1.7–7.7)
Neutrophils Relative %: 64 %
Platelet Count: 326 K/uL (ref 150–400)
RBC: 4.04 MIL/uL (ref 3.87–5.11)
RDW: 17 % — ABNORMAL HIGH (ref 11.5–15.5)
WBC Count: 7.2 K/uL (ref 4.0–10.5)
nRBC: 0 % (ref 0.0–0.2)

## 2024-02-01 LAB — CMP (CANCER CENTER ONLY)
ALT: 15 U/L (ref 0–44)
AST: 27 U/L (ref 15–41)
Albumin: 3.9 g/dL (ref 3.5–5.0)
Alkaline Phosphatase: 67 U/L (ref 38–126)
Anion gap: 8 (ref 5–15)
BUN: 18 mg/dL (ref 8–23)
CO2: 27 mmol/L (ref 22–32)
Calcium: 9.4 mg/dL (ref 8.9–10.3)
Chloride: 99 mmol/L (ref 98–111)
Creatinine: 0.93 mg/dL (ref 0.44–1.00)
GFR, Estimated: >60 mL/min (ref >60)
Glucose, Bld: 99 mg/dL (ref 70–99)
Potassium: 3.5 mmol/L (ref 3.5–5.1)
Sodium: 134 mmol/L — ABNORMAL LOW (ref 135–145)
Total Bilirubin: 0.5 mg/dL (ref 0.0–1.2)
Total Protein: 7.3 g/dL (ref 6.5–8.1)

## 2024-02-01 MED ORDER — SODIUM CHLORIDE 0.9 % IV SOLN
200.0000 mg | Freq: Once | INTRAVENOUS | Status: AC
  Administered 2024-02-01: 200 mg via INTRAVENOUS
  Filled 2024-02-01: qty 8

## 2024-02-01 MED ORDER — SODIUM CHLORIDE 0.9 % IV SOLN
Freq: Once | INTRAVENOUS | Status: AC
  Filled 2024-02-01: qty 250

## 2024-02-01 NOTE — Assessment & Plan Note (Signed)
 12/21/2022 Non-muscle invasive bladder cancer. S/p TURBT, T1 lesion, high grade.  03/29/2023 Non-muscle invasive bladder cancer. S/p TURBT Patient is s/p intravesical BCG treatments-she is off  suppressive antibiotics Recommend patient to Follow-up with urology

## 2024-02-01 NOTE — Assessment & Plan Note (Signed)
 Non-small cell lung cancer, favor squamous cell carcinoma, stage IV- TPS 90%, TMB 10, KRAS G12V MRI brain with and without contrast -  Left occipital focus of prior hemorrhage with mild linear contrast enhancement. Possible small metastasis or microhemmorhagic event.  TPS 90%, CT in May 2025 showed stable disease Labs are reviewed and discussed with patient.  Proceed with Keytruda  Plan to repeat CT in Oct.

## 2024-02-01 NOTE — Assessment & Plan Note (Signed)
 Immunotherapy treatment plan as listed above.

## 2024-02-01 NOTE — Progress Notes (Signed)
 Hematology/Oncology Progress note [phone] Fax:([phone]) [phone]       REASON OF VISIT Stage IV Lung squamous cell carcinoma  ASSESSMENT & PLAN:   Cancer Staging  Malignant neoplasm of lung (HCC) Staging form: Lung, AJCC 8th Edition - Clinical: Stage IVA (cT4, cN3, cM1a) - Signed by Babara Call, MD on 06/28/2022  Urothelial carcinoma of bladder [facility]) Staging form: Urinary Bladder, AJCC 8th Edition - Clinical stage from 12/21/2022: Stage I (cT1, cN0, cM0) - Signed by Babara Call, MD on 12/29/2022   Tobacco use Encourage patient's smoking cessation efforts.  Malignant neoplasm of lung (HCC) Non-small cell lung cancer, favor squamous cell carcinoma, stage IV- TPS 90%, TMB 10, KRAS G12V MRI brain with and without contrast -  Left occipital focus of prior hemorrhage with mild linear contrast enhancement. Possible small metastasis or microhemmorhagic event.  TPS 90%, CT in May 2025 showed stable disease Labs are reviewed and discussed with patient.  Proceed with Keytruda  Plan to repeat CT in Oct.   Encounter for antineoplastic immunotherapy Immunotherapy treatment plan as listed above.  Normocytic anemia Chronic anemia.  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  03/29/2023 Non-muscle invasive bladder cancer. S/p TURBT Patient is s/p intravesical BCG treatments-she is off  suppressive antibiotics Recommend patient to Follow-up with urology   Orders Placed This Encounter  Procedures   CBC with Differential (Cancer Center Only)    Standing Status:   Future    Expected Date:   02/22/2024    Expiration Date:   02/21/2025   CMP (Cancer Center only)    Standing Status:   Future    Expected Date:   02/22/2024    Expiration Date:   02/21/2025   CBC with Differential (Cancer Center Only)    Standing Status:   Future    Expected Date:   03/14/2024    Expiration Date:    03/14/2025   CMP (Cancer Center only)    Standing Status:   Future    Expected Date:   03/14/2024    Expiration Date:   03/14/2025   T4    Standing Status:   Future    Expected Date:   03/14/2024    Expiration Date:   03/14/2025   TSH    Standing Status:   Future    Expected Date:   03/14/2024    Expiration Date:   03/14/2025   Follow up in 3 weeks.   All questions were answered. The patient knows to call the clinic with any problems, questions or concerns.  Call Babara, MD, PhD [facility] Hematology Oncology 02/01/2024       HISTORY OF PRESENTING ILLNESS:  Regina Perkins 83 y.o. 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 I have reviewed her chart and materials related to her cancer extensively and collaborated history with the patient. Summary of oncologic history is as follows: Oncology History  Malignant neoplasm of lung (HCC)  05/24/2022 Imaging   CT chest angiogram showed 1. No evidence for pulmonary embolism. 2. Left upper lobe/prevascular mass worrisome for neoplasm. 3. Mediastinal and hilar lymphadenopathy. 4. Multiple pulmonary nodules measuring up to 11 mm worrisome for metastatic disease. 5. Small left pleural effusion. 6. Patchy ground-glass and airspace opacities in the left upper lobe worrisome for infection. 7. Moderate-sized pericardial effusion. 8. Cholelithiasis. 9. Nonobstructing left renal calculus  05/25/2022 Imaging   CT abdomen pelvis wo contrast 1. No acute findings or explanation for the patient's symptoms. 2. No evidence of primary malignancy or metastatic disease within the abdomen or pelvis on noncontrast imaging.  3. Cholelithiasis without evidence of cholecystitis or biliary dilatation. 4. Nonobstructing left renal calculus. 5. Moderate stool throughout the colon suggesting constipation. 6.  Aortic Atherosclerosis    05/26/2022 Initial  Diagnosis   Malignant neoplasm of lung - TPS 90%, TMB 10, KRAS G12V  -05/22/2022 in the emergency room, she was found to have hypoxia with oxygen levels of 86% on room air, improved to 94 with 2 L of oxygen.  D-dimer was elevated at 1.29, troponin negative.  Lower extremity Doppler was negative for DVT.  VQ scan high probability PE (Large segmental perfusion defect of the left upper lobe without corresponding radiographic abnormality. Additional bilateral wedge-shaped subsegmental branch perfusion defects .  Patient was admitted and started on heparin  Echocardiogram showed no RV strain.  Moderate pericardial effusion. Over her hospitalization, she continues to have shortness of breath with minimal exertion. 05/24/2022, CT chest angiogram showed left lung mass with bilateral lung nodules.   06/16/2022 Imaging   PET scan  1. 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 No evidence of metastatic disease in the abdomen or pelvis. 2. Moderate pericardial effusion. 3. Tiny left pleural effusion. 4. Left renal stone. 5. Aortic atherosclerosis (ICD10-I70.0). Coronary artery calcification. 6. 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    06/28/2022 Cancer Staging   Staging form: Lung, AJCC 8th Edition - Clinical: Stage IVA (cT4, cN3, cM1a) - Signed by Babara Call, MD on 06/28/2022   07/14/2022 -  Chemotherapy   Patient is on Treatment Plan : LUNG NSCLC Pembrolizumab  (200) q21d     09/29/2022 Imaging   CT chest abdomen pelvis w contrast  showed Bilateral pulmonary nodules, some of which are waxing/waning, but favoring multifocal lung carcinoma/metastases.   Improving thoracic nodal metastases, as above.  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 Consider cystoscopy as clinically warranted.  Aortic Atherosclerosis (ICD10-I70.0) and Emphysema (ICD10-J43.9)   01/06/2023 Imaging   CT chest abdomen pelvis w contrast showed 1. Interval decrease in size of multiple spiculated and subsolid pulmonary nodules, consistent with treatment response. 2. No significant change in matted post treatment prevascular and left superior mediastinal soft tissue. 3. Diminished size of an endoluminal bladder mass seen on prior examination, soft tissue in this vicinity now measuring no greater than 2.1 x 1.4 cm, previously at least 4.7 x 2.1 cm. Presumably this has been at least partially resected. 4. No evidence of lymphadenopathy or metastatic disease in the abdomen or pelvis. 5. Cholelithiasis. 6. Nonobstructive left nephrolithiasis. 7. Coronary artery disease.   01/10/2023 Procedure   Medi port placed by IR   05/11/2023 Imaging   CT chest abdomen pelvis w contrast showed 1. Unchanged matted, treated prevascular and left superior mediastinal soft tissue. 2. Multiple bilateral spiculated and ground-glass pulmonary nodules not significantly changed. 3. No residual mass of the posterior right aspect of the urinary bladder. Mild residual thickening in this vicinity. Interval placement of right-sided double-J ureteral stent catheter. 4. No evidence of lymphadenopathy  or metastatic disease in the abdomen or pelvis. 5. Emphysema and diffuse bilateral bronchial wall thickening. 6. Coronary artery disease.   Aortic Atherosclerosis (ICD10-I70.0) and Emphysema (ICD10-J43.9).    Urothelial carcinoma of bladder (HCC)  10/06/2022 Initial Diagnosis   Urothelial carcinoma of bladder S/p TURBT  1. Bladder, transurethral resection, Tumor HIGH GRADE  PAPILLARY UROTHELIAL CARCINOMA WITH SQUAMOUS CELL COMPONENT (10%) THE CARCINOMA FOCALLY INVADES LAMINAR PROPRIA MUSCULARIS PROPRIA (DETRUSOR MUSCLE) IS PRESENT AND NOT INVOLVED BY CARCINOMA 2. Bladder, biopsy, Base of tumor HIGH GRADE PAPILLARY UROTHELIAL CARCINOMA SUBMUCOSA AND MUSCULARIS PROPRIA IS PRESENT AND NOT INVOLVED BY CARCINOMA  Postprocedure, patient developed gross hematuria and was admitted to the hospital.  She received PRBC transfusion.  Hemoglobin improved and she was discharged.    12/21/2022 Cancer Staging   Staging form: Urinary Bladder, AJCC 8th Edition - Clinical stage from 12/21/2022: Stage I (cT1, cN0, cM0) - Signed by Babara Call, MD on 12/29/2022 WHO/ISUP grade (low/high): High Grade Histologic grading system: 2 grade system     INTERVAL HISTORY Regina Perkins is a 83 y.o. female who has above history reviewed by me today presents for follow up visit for Lung squamous cell carcinoma She tolerates [place] ([place]) well. Denies skin rash, diarrhea.  Patient has no new complaints.  Denies chest pain, chest palpitation. She denies headache, focal weakness or slurred speech.    MEDICAL HISTORY:  Past Medical History:  Diagnosis Date   Atherosclerosis of native arteries of extremity with intermittent claudication    Bladder mass    Chronic kidney disease, stage 3a (HCC)    COPD (chronic obstructive pulmonary disease) (HCC)    Diabetes mellitus without complication (HCC)    Gastrointestinal hemorrhage    GERD (gastroesophageal reflux disease)    Glaucoma    History of duodenal ulcer    HTN (hypertension)    Hypertension    Hypokalemia    Hypomagnesemia    Leukocytosis    Lung mass    Malignant neoplasm of lung (HCC) 06/28/2022   a.) stage IVA (cT4, cN3, cM1a) favor squamous cell carcinoma --> TPS 90%, TMB 10, KRAS G12V   Malnutrition    Normocytic anemia    Pericardial effusion 05/2022   Pulmonary embolism (HCC)    Sepsis (HCC)    Tobacco abuse    Type II diabetes  mellitus with renal manifestations (HCC)    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   a.) stage I (cT1, cN0, cM0)    SURGICAL HISTORY: Past Surgical History:  Procedure Laterality Date   ABDOMINAL HYSTERECTOMY     BACK SURGERY     1988 and 1998   CYSTOSCOPY W/ RETROGRADES Bilateral 12/21/2022   Procedure: CYSTOSCOPY WITH RETROGRADE PYELOGRAM;  Surgeon: Twylla Glendia BROCKS, MD;  Location: [facility] ORS;  Service: Urology;  Laterality: Bilateral;   CYSTOSCOPY WITH BIOPSY N/A 03/29/2023   Procedure: CYSTOSCOPY WITH BLADDER BIOPSY;  Surgeon: Twylla Glendia BROCKS, MD;  Location: [facility];  Service: Urology;  Laterality: N/A;   CYSTOSCOPY WITH STENT PLACEMENT Right 03/29/2023   Procedure: CYSTOSCOPY WITH STENT PLACEMENT;  Surgeon: Twylla Glendia BROCKS, MD;  Location: [facility];  Service: Urology;  Laterality: Right;   ESOPHAGOGASTRODUODENOSCOPY (EGD) WITH PROPOFOL  N/A 07/29/2022   Procedure: ESOPHAGOGASTRODUODENOSCOPY (EGD) WITH PROPOFOL ;  Surgeon: Therisa Bi, MD;  Location: [facility] ENDOSCOPY;  Service: Gastroenterology;  Laterality: N/A;   ESOPHAGOGASTRODUODENOSCOPY (EGD) WITH PROPOFOL  N/A 01/21/2023   Procedure: ESOPHAGOGASTRODUODENOSCOPY (EGD) WITH PROPOFOL ;  Surgeon: Therisa Bi, MD;  Location: [facility]OSCOPY;  Service: Gastroenterology;  Laterality:  N/A;   HOLMIUM LASER APPLICATION Right 03/29/2023   Procedure: HOLMIUM LASER ABLATION;  Surgeon: Twylla Glendia BROCKS, MD;  Location: [facility]S;  Service: Urology;  Laterality: Right;   IR IMAGING GUIDED PORT INSERTION  01/10/2023   TRANSURETHRAL RESECTION OF BLADDER TUMOR N/A 12/21/2022   Procedure: TRANSURETHRAL RESECTION OF BLADDER TUMOR (TURBT);  Surgeon: Twylla Glendia BROCKS, MD;  Location: [facility]S;  Service: Urology;  Laterality: N/A;   TRANSURETHRAL RESECTION OF BLADDER TUMOR N/A 03/29/2023   Procedure: TRANSURETHRAL RESECTION OF BLADDER TUMOR (TURBT);  Surgeon: Twylla Glendia BROCKS, MD;  Location: [facility] OR[facility];  Service: Urology;  Laterality: N/A;   URETEROSCOPY Right  03/29/2023   Procedure: DIAGNOSTIC URETEROSCOPY;  Surgeon: Twylla Glendia BROCKS, MD;  Location: [facility];  Service: Urology;  Laterality: Right;    SOCIAL HISTORY: Social History   Socioeconomic History   Marital status: Widowed    Spouse name: Not on file   Number of children: Not on file   Years of education: Not on file   Highest education level: Not on file  Occupational History   Not on file  Tobacco Use   Smoking status: Every Day    Current packs/day: 0.25    Types: Cigarettes   Smokeless tobacco: Never  Vaping Use   Vaping status: Never Used  Substance and Sexual Activity   Alcohol use: No   Drug use: No   Sexual activity: Not on file  Other Topics Concern   Not on file  Social History Narrative   Not on file   Social Drivers of Health   Financial Resource Strain: Low Risk  (06/28/2022)   Overall Financial Resource Strain (CARDIA)    Difficulty of Paying Living Expenses: Not very hard  Food Insecurity: No Food Insecurity (12/22/2022)   Hunger Vital Sign    Worried About Running Out of Food in the Last Year: Never true    Ran Out of Food in the Last Year: Never true  Transportation Needs: No Transportation Needs (12/22/2022)   PRAPARE - Administrator, Civil Service (Medical): No    Lack of Transportation (Non-Medical): No  Physical Activity: Not on file  Stress: No Stress Concern Present (06/28/2022)   [employer] of Occupational Health - Occupational Stress Questionnaire    Feeling of Stress : Not at all  Social Connections: Not on file  Intimate Partner Violence: Not At Risk (12/22/2022)   Humiliation, Afraid, Rape, and Kick questionnaire    Fear of Current or Ex-Partner: No    Emotionally Abused: No    Physically Abused: No    Sexually Abused: No    FAMILY HISTORY: Family History  Problem Relation Age of Onset   Asthma Mother    Heart disease Mother    Hypertension Mother    Diabetes Mother    Stroke Mother    Diabetes Sister     Diabetes Brother     ALLERGIES:  is allergic to ivp dye [iodinated contrast media] and shellfish allergy.  MEDICATIONS:  Current Outpatient Medications  Medication Sig Dispense Refill   albuterol  (VENTOLIN  HFA) 108 (90 Base) MCG/ACT inhaler Inhale 2 puffs into the lungs every 4 (four) hours as needed for wheezing or shortness of breath.     amLODipine  (NORVASC ) 10 MG tablet Take 10 mg by mouth every morning.     aspirin  EC 81 MG tablet Take 1 tablet (81 mg total) by mouth daily. Swallow whole. 30 tablet 12   cyanocobalamin  (VITAMIN B12) 1000 MCG tablet  Take 1 tablet (1,000 mcg total) by mouth daily. 90 tablet 0   lidocaine -prilocaine  (EMLA ) cream Apply 1 Application topically as needed. Apply to port and cover with saran wrap 1-2 hours prior to port access 30 g 1   lisinopril  (ZESTRIL ) 40 MG tablet Take 40 mg by mouth every morning.     metFORMIN  (GLUCOPHAGE ) 1000 MG tablet Take 1,000 mg by mouth daily with breakfast. (Patient taking differently: Take 500 mg by mouth daily with breakfast.)     Current Facility-Administered Medications  Medication Dose Route Frequency Provider Last Rate Last Admin   bcg vaccine injection 81 mg  3.24 mL Bladder Instillation Once         Review of Systems  Constitutional:  Negative for appetite change, chills, fatigue and fever.  HENT:   Negative for hearing loss and voice change.   Eyes:  Negative for eye problems.  Respiratory:  Negative for chest tightness, cough and shortness of breath.   Cardiovascular:  Negative for chest pain.  Gastrointestinal:  Negative for abdominal distention, abdominal pain and blood in stool.  Endocrine: Negative for hot flashes.  Genitourinary:  Negative for difficulty urinating and frequency.   Musculoskeletal:  Negative for arthralgias.  Skin:  Negative for itching and rash.  Neurological:  Negative for extremity weakness.  Hematological:  Negative for adenopathy.  Psychiatric/Behavioral:  Negative for confusion.       PHYSICAL EXAMINATION: ECOG PERFORMANCE STATUS: 2 - Symptomatic, <50% confined to bed  Vitals:   02/01/24 0919  BP: (!) 145/88  Pulse: 91  Resp: 18  Temp: (!) 96.9 F (36.1 C)  SpO2: 99%    Filed Weights   02/01/24 0919  Weight: 111 lb (50.3 kg)      Physical Exam Constitutional:      General: She is not in acute distress.    Appearance: She is not diaphoretic.  HENT:     Head: Normocephalic.  Eyes:     General: No scleral icterus.    Pupils: Pupils are equal, round, and reactive to light.  Cardiovascular:     Rate and Rhythm: Tachycardia present.     Heart sounds: No murmur heard. Pulmonary:     Effort: Pulmonary effort is normal. No respiratory distress.     Comments: Decreased breath sounds bilaterally Abdominal:     General: There is no distension.  Musculoskeletal:        General: Normal range of motion.     Cervical back: Normal range of motion.  Skin:    Findings: No erythema.  Neurological:     Mental Status: She is alert and oriented to person, place, and time. Mental status is at baseline.     Cranial Nerves: No cranial nerve deficit.     Motor: No abnormal muscle tone.  Psychiatric:        Mood and Affect: Affect normal.      LABORATORY DATA:  I have reviewed the data as listed    Latest Ref Rng & Units 02/01/2024    9:06 AM 01/11/2024    8:50 AM 12/21/2023    9:04 AM  CBC  WBC 4.0 - 10.5 K/uL 7.2  6.4  7.0   Hemoglobin 12.0 - 15.0 g/dL 88.5  88.8  89.3   Hematocrit 36.0 - 46.0 % 35.1  34.4  32.8   Platelets 150 - 400 K/uL 326  294  271       Latest Ref Rng & Units 02/01/2024    9:06 AM 01/11/2024  8:50 AM 12/28/2023    9:36 AM  CMP  Glucose 70 - 99 mg/dL 99  859  848   BUN 8 - 23 mg/dL 18  15  21    Creatinine 0.44 - 1.00 mg/dL 9.06  9.02  8.87   Sodium 135 - 145 mmol/L 134  137  136   Potassium 3.5 - 5.1 mmol/L 3.5  3.2  3.6   Chloride 98 - 111 mmol/L 99  101  98   CO2 22 - 32 mmol/L 27  27  28    Calcium  8.9 - 10.3 mg/dL 9.4   9.5  9.3   Total Protein 6.5 - 8.1 g/dL 7.3  7.6    Total Bilirubin 0.0 - 1.2 mg/dL 0.5  0.4    Alkaline Phos 38 - 126 U/L 67  66    AST 15 - 41 U/L 27  28    ALT 0 - 44 U/L 15  14       RADIOGRAPHIC STUDIES: I have personally reviewed the radiological images as listed and agreed with the findings in the report. No results found.

## 2024-02-01 NOTE — Assessment & Plan Note (Signed)
 Chronic anemia.  Hemoglobin is stable

## 2024-02-01 NOTE — Assessment & Plan Note (Signed)
 Encourage patient's smoking cessation efforts.

## 2024-02-15 ENCOUNTER — Ambulatory Visit
Admission: RE | Admit: 2024-02-15 | Discharge: 2024-02-15 | Disposition: A | Discharge status: Home / Self Care | Source: Ambulatory Visit | Attending: Oncology | Admitting: Oncology

## 2024-02-15 DIAGNOSIS — C3482 Malignant neoplasm of overlapping sites of left bronchus and lung: Secondary | ICD-10-CM | POA: Diagnosis present

## 2024-02-21 ENCOUNTER — Ambulatory Visit: Payer: Self-pay | Admitting: *Deleted

## 2024-02-22 ENCOUNTER — Inpatient Hospital Stay

## 2024-02-22 ENCOUNTER — Encounter: Payer: Self-pay | Admitting: Oncology

## 2024-02-22 ENCOUNTER — Inpatient Hospital Stay (HOSPITAL_BASED_OUTPATIENT_CLINIC_OR_DEPARTMENT_OTHER): Admitting: Oncology

## 2024-02-22 VITALS — BP 179/81 | HR 79 | Temp 96.9°F | Resp 16 | Wt 113.7 lb

## 2024-02-22 DIAGNOSIS — C3482 Malignant neoplasm of overlapping sites of left bronchus and lung: Secondary | ICD-10-CM

## 2024-02-22 DIAGNOSIS — D649 Anemia, unspecified: Secondary | ICD-10-CM

## 2024-02-22 DIAGNOSIS — Z5112 Encounter for antineoplastic immunotherapy: Secondary | ICD-10-CM

## 2024-02-22 DIAGNOSIS — C679 Malignant neoplasm of bladder, unspecified: Secondary | ICD-10-CM

## 2024-02-22 LAB — CBC WITH DIFFERENTIAL (CANCER CENTER ONLY)
Abs Immature Granulocytes: 0.02 K/uL (ref 0.00–0.07)
Basophils Absolute: 0 K/uL (ref 0.0–0.1)
Basophils Relative: 1 %
Eosinophils Absolute: 0.3 K/uL (ref 0.0–0.5)
Eosinophils Relative: 5 %
HCT: 33.1 % — ABNORMAL LOW (ref 36.0–46.0)
Hemoglobin: 10.6 g/dL — ABNORMAL LOW (ref 12.0–15.0)
Immature Granulocytes: 0 %
Lymphocytes Relative: 29 %
Lymphs Abs: 1.9 K/uL (ref 0.7–4.0)
MCH: 28.2 pg (ref 26.0–34.0)
MCHC: 32 g/dL (ref 30.0–36.0)
MCV: 88 fL (ref 80.0–100.0)
Monocytes Absolute: 0.5 K/uL (ref 0.1–1.0)
Monocytes Relative: 8 %
Neutro Abs: 3.7 K/uL (ref 1.7–7.7)
Neutrophils Relative %: 57 %
Platelet Count: 282 K/uL (ref 150–400)
RBC: 3.76 MIL/uL — ABNORMAL LOW (ref 3.87–5.11)
RDW: 17.2 % — ABNORMAL HIGH (ref 11.5–15.5)
WBC Count: 6.5 K/uL (ref 4.0–10.5)
nRBC: 0 % (ref 0.0–0.2)

## 2024-02-22 LAB — CMP (CANCER CENTER ONLY)
ALT: 16 U/L (ref 0–44)
AST: 26 U/L (ref 15–41)
Albumin: 3.8 g/dL (ref 3.5–5.0)
Alkaline Phosphatase: 65 U/L (ref 38–126)
Anion gap: 10 (ref 5–15)
BUN: 20 mg/dL (ref 8–23)
CO2: 25 mmol/L (ref 22–32)
Calcium: 9.3 mg/dL (ref 8.9–10.3)
Chloride: 104 mmol/L (ref 98–111)
Creatinine: 0.95 mg/dL (ref 0.44–1.00)
GFR, Estimated: 59 mL/min — ABNORMAL LOW (ref >60)
Glucose, Bld: 124 mg/dL — ABNORMAL HIGH (ref 70–99)
Potassium: 3.3 mmol/L — ABNORMAL LOW (ref 3.5–5.1)
Sodium: 139 mmol/L (ref 135–145)
Total Bilirubin: 0.5 mg/dL (ref 0.0–1.2)
Total Protein: 7.3 g/dL (ref 6.5–8.1)

## 2024-02-22 MED ORDER — SODIUM CHLORIDE 0.9 % IV SOLN
Freq: Once | INTRAVENOUS | Status: AC
  Filled 2024-02-22: qty 250

## 2024-02-22 MED ORDER — SODIUM CHLORIDE 0.9 % IV SOLN
200.0000 mg | Freq: Once | INTRAVENOUS | Status: AC
  Administered 2024-02-22: 200 mg via INTRAVENOUS
  Filled 2024-02-22: qty 8

## 2024-02-22 NOTE — Patient Instructions (Signed)
 [facility] - A DEPT OF [facility]  Discharge Instructions: Thank you for choosing [facility] to provide your oncology and hematology care.  If you have a lab appointment with the Cancer Center, please go directly to the Cancer Center and check in at the registration area.  Wear comfortable clothing and clothing appropriate for easy access to any Portacath or PICC line.   We strive to give you quality time with your provider. You may need to reschedule your appointment if you arrive late (15 or more minutes).  Arriving late affects you and other patients whose appointments are after yours.  Also, if you miss three or more appointments without notifying the office, you may be dismissed from the clinic at the provider's discretion.      For prescription refill requests, have your pharmacy contact our office and allow 72 hours for refills to be completed.    Today you received the following chemotherapy and/or immunotherapy agents Regina Perkins      To help prevent nausea and vomiting after your treatment, we encourage you to take your nausea medication as directed.  BELOW ARE SYMPTOMS THAT SHOULD BE REPORTED IMMEDIATELY: *FEVER GREATER THAN 100.4 F (38 C) OR HIGHER *CHILLS OR SWEATING *NAUSEA AND VOMITING THAT IS NOT CONTROLLED WITH YOUR NAUSEA MEDICATION *UNUSUAL SHORTNESS OF BREATH *UNUSUAL BRUISING OR BLEEDING *URINARY PROBLEMS (pain or burning when urinating, or frequent urination) *BOWEL PROBLEMS (unusual diarrhea, constipation, pain near the anus) TENDERNESS IN MOUTH AND THROAT WITH OR WITHOUT PRESENCE OF ULCERS (sore throat, sores in mouth, or a toothache) UNUSUAL RASH, SWELLING OR PAIN  UNUSUAL VAGINAL DISCHARGE OR ITCHING   Items with * indicate a potential emergency and should be followed up as soon as possible or go to the Emergency Department if any problems should occur.  Please show the CHEMOTHERAPY ALERT CARD or IMMUNOTHERAPY  ALERT CARD at check-in to the Emergency Department and triage nurse.  Should you have questions after your visit or need to cancel or reschedule your appointment, please contact [facility] - A DEPT OF [facility]  [phone] and follow the prompts.  Office hours are 8:00 a.m. to 4:30 p.m. Monday - Friday. Please note that voicemails left after 4:00 p.m. may not be returned until the following business day.  We are closed weekends and major holidays. You have access to a nurse at all times for urgent questions. Please call the main number to the clinic [phone] and follow the prompts.  For any non-urgent questions, you may also contact your provider using MyChart. We now offer e-Visits for anyone 48 and older to request care online for non-urgent symptoms. For details visit [email or web address].   Also download the MyChart app! Go to the app store, search "MyChart", open the app, select [place], and log in with your MyChart username and password.

## 2024-02-22 NOTE — Progress Notes (Signed)
 Hematology/Oncology Progress note [phone] [phone]       REASON OF VISIT Stage IV Lung squamous cell carcinoma  ASSESSMENT & PLAN:   Cancer Staging  Malignant neoplasm of lung (HCC) Staging form: Lung, AJCC 8th Edition - Clinical: Stage IVA (cT4, cN3, cM1a) - Signed by Babara Call, MD on 06/28/2022  Urothelial carcinoma of bladder [facility]) Staging form: Urinary Bladder, AJCC 8th Edition - Clinical stage from 12/21/2022: Stage I (cT1, cN0, cM0) - Signed by Babara Call, MD on 12/29/2022   Malignant neoplasm of lung (HCC) Non-small cell lung cancer, favor squamous cell carcinoma, stage IV- TPS 90%, TMB 10, KRAS G12V MRI brain with and without contrast -  Left occipital focus of prior hemorrhage with mild linear contrast enhancement. Possible small metastasis or microhemmorhagic event.  TPS 90%, CT in Oct 2025 showed stable disease Labs are reviewed and discussed with patient.  Proceed with Keytruda    Encounter for antineoplastic immunotherapy Immunotherapy treatment plan as listed above.  Normocytic anemia Chronic anemia.  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  03/29/2023 Non-muscle invasive bladder cancer. S/p TURBT Patient is s/p intravesical BCG treatments-she is off  suppressive antibiotics Discussed with urology. Oct 2025 CT showed increased bladder mass. She missed her 6th dose of BCG and is overdue for her cystoscopy.  Recommend patient to Follow-up with urology   Orders Placed This Encounter  Procedures   CBC with Differential (Cancer Center Only)    Standing Status:   Future    Expected Date:   04/04/2024    Expiration Date:   04/04/2025   CMP (Cancer Center only)    Standing Status:   Future    Expected Date:   04/04/2024    Expiration Date:   04/04/2025   Follow up in 3 weeks.   All questions were answered. The patient knows to call the  clinic with any problems, questions or concerns.  Call Babara, MD, PhD [facility] Hematology Oncology 02/22/2024       HISTORY OF PRESENTING ILLNESS:  Regina Perkins 83 y.o. 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 I have reviewed her chart and materials related to her cancer extensively and collaborated history with the patient. Summary of oncologic history is as follows: Oncology History  Malignant neoplasm of lung (HCC)  05/24/2022 Imaging   CT chest angiogram showed 1. No evidence for pulmonary embolism. 2. Left upper lobe/prevascular mass worrisome for neoplasm. 3. Mediastinal and hilar lymphadenopathy. 4. Multiple pulmonary nodules measuring up to 11 mm worrisome for metastatic disease. 5. Small left pleural effusion. 6. Patchy ground-glass and airspace opacities in the left upper lobe worrisome for infection. 7. Moderate-sized pericardial effusion. 8. Cholelithiasis. 9. Nonobstructing left renal calculus   05/25/2022 Imaging   CT abdomen pelvis wo contrast 1. No acute findings or explanation for the patient's symptoms. 2. No evidence of primary malignancy or metastatic disease within the abdomen or pelvis on noncontrast imaging.  3. Cholelithiasis without evidence of cholecystitis or biliary dilatation. 4. Nonobstructing left renal calculus. 5. Moderate stool throughout the colon suggesting constipation. 6.  Aortic Atherosclerosis    05/26/2022 Initial Diagnosis   Malignant neoplasm of lung - TPS 90%, TMB 10, KRAS G12V  -05/22/2022 in the emergency room, she was found to have hypoxia with oxygen levels of 86% on room air, improved to 94 with  2 L of oxygen.  D-dimer was elevated at 1.29, troponin negative.  Lower extremity Doppler was negative for DVT.  VQ scan high probability PE (Large segmental perfusion defect of the left upper lobe without corresponding radiographic  abnormality. Additional bilateral wedge-shaped subsegmental branch perfusion defects .  Patient was admitted and started on heparin  Echocardiogram showed no RV strain.  Moderate pericardial effusion. Over her hospitalization, she continues to have shortness of breath with minimal exertion. 05/24/2022, CT chest angiogram showed left lung mass with bilateral lung nodules.   06/16/2022 Imaging   PET scan  1. 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 No evidence of metastatic disease in the abdomen or pelvis. 2. Moderate pericardial effusion. 3. Tiny left pleural effusion. 4. Left renal stone. 5. Aortic atherosclerosis (ICD10-I70.0). Coronary artery calcification. 6. 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    06/28/2022 Cancer Staging   Staging form: Lung, AJCC 8th Edition - Clinical: Stage IVA (cT4, cN3, cM1a) - Signed by Babara Call, MD on 06/28/2022   07/14/2022 -  Chemotherapy   Patient is on Treatment Plan : LUNG NSCLC Pembrolizumab  (200) q21d     09/29/2022 Imaging   CT chest abdomen pelvis w contrast showed Bilateral pulmonary nodules, some of which are waxing/waning, but favoring multifocal lung carcinoma/metastases.   Improving thoracic nodal metastases, as above.  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 Consider cystoscopy as clinically warranted.  Aortic Atherosclerosis (ICD10-I70.0) and Emphysema (ICD10-J43.9)   01/06/2023 Imaging   CT chest abdomen pelvis w contrast showed 1. Interval decrease in size of  multiple spiculated and subsolid pulmonary nodules, consistent with treatment response. 2. No significant change in matted post treatment prevascular and left superior mediastinal soft tissue. 3. Diminished size of an endoluminal bladder mass seen on prior examination, soft tissue in this vicinity now measuring no greater than 2.1 x 1.4 cm, previously at least 4.7 x 2.1 cm. Presumably this has been at least partially resected. 4. No evidence of lymphadenopathy or metastatic disease in the abdomen or pelvis. 5. Cholelithiasis. 6. Nonobstructive left nephrolithiasis. 7. Coronary artery disease.   01/10/2023 Procedure   Medi port placed by IR   05/11/2023 Imaging   CT chest abdomen pelvis w contrast showed 1. Unchanged matted, treated prevascular and left superior mediastinal soft tissue. 2. Multiple bilateral spiculated and ground-glass pulmonary nodules not significantly changed. 3. No residual mass of the posterior right aspect of the urinary bladder. Mild residual thickening in this vicinity. Interval placement of right-sided double-J ureteral stent catheter. 4. No evidence of lymphadenopathy or metastatic disease in the abdomen or pelvis. 5. Emphysema and diffuse bilateral bronchial wall thickening. 6. Coronary artery disease.   Aortic Atherosclerosis (ICD10-I70.0) and Emphysema (ICD10-J43.9).    02/15/2024 Imaging   CT chest abdomen pelvis wo contrast showed  1. Unchanged bilateral pulmonary nodules and consolidative opacity of the paramedian left upper lobe. 2. No significant change in matted soft tissue or treated lymph nodes in the prevascular station. No discretely enlarged mediastinal, hilar, or axillary lymph nodes. 3. Endoluminal mass of the right aspect of the urinary bladder, better seen on today's examination and appears to have enlarged at 3.1 x  1.7 cm, difficult to confidently measure on prior although approximately 1.9 x 1.2 cm. This is most consistent with  a primary bladder malignancy. 4. Right-sided double-J ureteral stent catheter with formed pigtails in the right renal pelvis and urinary bladder. Unchanged mild right hydronephrosis. 5. Cholelithiasis. 6. Coronary artery disease.     Urothelial carcinoma of bladder (HCC)  10/06/2022 Initial Diagnosis   Urothelial carcinoma of bladder S/p TURBT  1. 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 Bladder, biopsy, Base of tumor HIGH GRADE PAPILLARY UROTHELIAL CARCINOMA SUBMUCOSA AND MUSCULARIS PROPRIA IS PRESENT AND NOT INVOLVED BY CARCINOMA  Postprocedure, patient developed gross hematuria and was admitted to the hospital.  She received PRBC transfusion.  Hemoglobin improved and she was discharged.    12/21/2022 Cancer Staging   Staging form: Urinary Bladder, AJCC 8th Edition - Clinical stage from 12/21/2022: Stage I (cT1, cN0, cM0) - Signed by Babara Call, MD on 12/29/2022 WHO/ISUP grade (low/high): High Grade Histologic grading system: 2 grade system   02/15/2024 Imaging   CT chest abdomen pelvis wo contrast showed  1. Unchanged bilateral pulmonary nodules and consolidative opacity of the paramedian left upper lobe. 2. No significant change in matted soft tissue or treated lymph nodes in the prevascular station. No discretely enlarged mediastinal, hilar, or axillary lymph nodes. 3. Endoluminal mass of the right aspect of the urinary bladder, better seen on today's examination and appears to have enlarged at 3.1 x 1.7 cm, difficult to confidently measure on prior although approximately 1.9 x 1.2 cm. This is most consistent with a primary bladder malignancy. 4. Right-sided double-J ureteral stent catheter with formed pigtails in the right renal pelvis and urinary bladder. Unchanged mild  right hydronephrosis. 5. Cholelithiasis. 6. Coronary artery disease.       INTERVAL HISTORY Regina Perkins is a 83 y.o. female who has above history reviewed by me today presents for follow up visit for Lung squamous cell carcinoma She tolerates keytuda well. Denies skin rash, diarrhea.  Patient has no new complaints.  Denies chest pain, chest palpitation. She denies headache, focal weakness or slurred speech.  BP is high in clinic. She just took her BP meds.    MEDICAL HISTORY:  Past Medical History:  Diagnosis Date   Atherosclerosis of native arteries of extremity with intermittent claudication    Bladder mass    Chronic kidney disease, stage 3a (HCC)    COPD (chronic obstructive pulmonary disease) (HCC)    Diabetes mellitus without complication (HCC)    Gastrointestinal hemorrhage    GERD (gastroesophageal reflux disease)    Glaucoma    History of duodenal ulcer    HTN (hypertension)    Hypertension    Hypokalemia    Hypomagnesemia    Leukocytosis    Lung mass    Malignant neoplasm of lung (HCC) 06/28/2022   a.) stage IVA (cT4, cN3, cM1a) favor squamous cell carcinoma --> TPS 90%, TMB 10, KRAS G12V   Malnutrition    Normocytic anemia    Pericardial effusion 05/2022   Pulmonary embolism (HCC)    Sepsis (HCC)    Tobacco abuse    Type II diabetes mellitus with renal manifestations (HCC)    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   a.) stage I (cT1, cN0, cM0)    SURGICAL HISTORY: Past Surgical History:  Procedure Laterality Date   ABDOMINAL  HYSTERECTOMY     BACK SURGERY     1988 and 1998   CYSTOSCOPY W/ RETROGRADES Bilateral 12/21/2022   Procedure: CYSTOSCOPY WITH RETROGRADE PYELOGRAM;  Surgeon: Twylla Glendia BROCKS, MD;  Location: [facility];  Service: Urology;  Laterality: Bilateral;   CYSTOSCOPY WITH BIOPSY N/A 03/29/2023   Procedure: CYSTOSCOPY WITH BLADDER BIOPSY;  Surgeon: Twylla Glendia BROCKS, MD;  Location: [facility];  Service: Urology;  Laterality: N/A;    CYSTOSCOPY WITH STENT PLACEMENT Right 03/29/2023   Procedure: CYSTOSCOPY WITH STENT PLACEMENT;  Surgeon: Twylla Glendia BROCKS, MD;  Location: [facility];  Service: Urology;  Laterality: Right;   ESOPHAGOGASTRODUODENOSCOPY (EGD) WITH PROPOFOL  N/A 07/29/2022   Procedure: ESOPHAGOGASTRODUODENOSCOPY (EGD) WITH PROPOFOL ;  Surgeon: Therisa Bi, MD;  Location: [facility] END[facility]COPY;  Service: Gastroenterology;  Laterality: N/A;   ESOPHAGOGASTRODUODENOSCOPY (EGD) WITH PROPOFOL  N/A 01/21/2023   Procedure: ESOPHAGOGASTRODUODENOSCOPY (EGD) WITH PROPOFOL ;  Surgeon: Therisa Bi, MD;  Location: [facility] END[facility]COPY;  Service: Gastroenterology;  Laterality: N/A;   HOLMIUM LASER APPLICATION Right 03/29/2023   Procedure: HOLMIUM LASER ABLATION;  Surgeon: Twylla Glendia BROCKS, MD;  Location: [facility];  Service: Urology;  Laterality: Right;   IR IMAGING GUIDED PORT INSERTION  01/10/2023   TRANSURETHRAL RESECTION OF BLADDER TUMOR N/A 12/21/2022   Procedure: TRANSURETHRAL RESECTION OF BLADDER TUMOR (TURBT);  Surgeon: Twylla Glendia BROCKS, MD;  Location: [facility];  Service: Urology;  Laterality: N/A;   TRANSURETHRAL RESECTION OF BLADDER TUMOR N/A 03/29/2023   Procedure: TRANSURETHRAL RESECTION OF BLADDER TUMOR (TURBT);  Surgeon: Twylla Glendia BROCKS, MD;  Location: [facility];  Service: Urology;  Laterality: N/A;   URETEROSCOPY Right 03/29/2023   Procedure: DIAGNOSTIC URETEROSCOPY;  Surgeon: Twylla Glendia BROCKS, MD;  Location: [facility];  Service: Urology;  Laterality: Right;    SOCIAL HISTORY: Social History   Socioeconomic History   Marital status: Widowed    Spouse name: Not on file   Number of children: Not on file   Years of education: Not on file   Highest education level: Not on file  Occupational History   Not on file  Tobacco Use   Smoking status: Every Day    Current packs/day: 0.25    Types: Cigarettes   Smokeless tobacco: Never  Vaping Use   Vaping status: Never Used  Substance and Sexual Activity   Alcohol use: No   Drug use: No    Sexual activity: Not on file  Other Topics Concern   Not on file  Social History Narrative   Not on file   Social Drivers of Health   Financial Resource Strain: Low Risk  (06/28/2022)   Overall Financial Resource Strain (CARDIA)    Difficulty of Paying Living Expenses: Not very hard  Food Insecurity: No Food Insecurity (12/22/2022)   Hunger Vital Sign    Worried About Running Out of Food in the Last Year: Never true    Ran Out of Food in the Last Year: Never true  Transportation Needs: No Transportation Needs (12/22/2022)   PRAPARE - Administrator, Civil Service (Medical): No    Lack of Transportation (Non-Medical): No  Physical Activity: Not on file  Stress: No Stress Concern Present (06/28/2022)   [employer] of Occupational Health - Occupational Stress Questionnaire    Feeling of Stress : Not at all  Social Connections: Not on file  Intimate Partner Violence: Not At Risk (12/22/2022)   Humiliation, Afraid, Rape, and Kick questionnaire    Fear of Current or Ex-Partner: No    Emotionally Abused:  No    Physically Abused: No    Sexually Abused: No    FAMILY HISTORY: Family History  Problem Relation Age of Onset   Asthma Mother    Heart disease Mother    Hypertension Mother    Diabetes Mother    Stroke Mother    Diabetes Sister    Diabetes Brother     ALLERGIES:  is allergic to ivp dye [iodinated contrast media] and shellfish allergy.  MEDICATIONS:  Current Outpatient Medications  Medication Sig Dispense Refill   albuterol  (VENTOLIN  HFA) 108 (90 Base) MCG/ACT inhaler Inhale 2 puffs into the lungs every 4 (four) hours as needed for wheezing or shortness of breath.     amLODipine  (NORVASC ) 10 MG tablet Take 10 mg by mouth every morning.     aspirin  EC 81 MG tablet Take 1 tablet (81 mg total) by mouth daily. Swallow whole. 30 tablet 12   cyanocobalamin  (VITAMIN B12) 1000 MCG tablet Take 1 tablet (1,000 mcg total) by mouth daily. 90 tablet 0    fluticasone -salmeterol (ADVAIR) 250-50 MCG/ACT AEPB Inhale 1 puff into the lungs.     lidocaine -prilocaine  (EMLA ) cream Apply 1 Application topically as needed. Apply to port and cover with saran wrap 1-2 hours prior to port access 30 g 1   lisinopril  (ZESTRIL ) 40 MG tablet Take 40 mg by mouth every morning.     loratadine  (CLARITIN ) 10 MG tablet Take 10 mg by mouth daily.     meloxicam (MOBIC) 7.5 MG tablet Take 7.5-15 mg by mouth daily.     metFORMIN  (GLUCOPHAGE ) 1000 MG tablet Take 1,000 mg by mouth daily with breakfast. (Patient taking differently: Take 500 mg by mouth daily with breakfast.)     OXYGEN Inhale 2 L into the lungs as directed.     Current Facility-Administered Medications  Medication Dose Route Frequency Provider Last Rate Last Admin   bcg vaccine injection 81 mg  3.24 mL Bladder Instillation Once         Review of Systems  Constitutional:  Negative for appetite change, chills, fatigue and fever.  HENT:   Negative for hearing loss and voice change.   Eyes:  Negative for eye problems.  Respiratory:  Negative for chest tightness, cough and shortness of breath.   Cardiovascular:  Negative for chest pain.  Gastrointestinal:  Negative for abdominal distention, abdominal pain and blood in stool.  Endocrine: Negative for hot flashes.  Genitourinary:  Negative for difficulty urinating and frequency.   Musculoskeletal:  Negative for arthralgias.  Skin:  Negative for itching and rash.  Neurological:  Negative for extremity weakness.  Hematological:  Negative for adenopathy.  Psychiatric/Behavioral:  Negative for confusion.      PHYSICAL EXAMINATION: ECOG PERFORMANCE STATUS: 2 - Symptomatic, <50% confined to bed  Vitals:   02/22/24 0855 02/22/24 0905  BP: (!) 184/90 (!) 179/81  Pulse:  79  Resp:    Temp:    SpO2:      Filed Weights   02/22/24 0853  Weight: 113 lb 11.2 oz (51.6 kg)      Physical Exam Constitutional:      General: She is not in acute  distress.    Appearance: She is not diaphoretic.  HENT:     Head: Normocephalic.  Eyes:     General: No scleral icterus.    Pupils: Pupils are equal, round, and reactive to light.  Cardiovascular:     Rate and Rhythm: Tachycardia present.     Heart sounds: No murmur  heard. Pulmonary:     Effort: Pulmonary effort is normal. No respiratory distress.     Comments: Decreased breath sounds bilaterally Abdominal:     General: There is no distension.  Musculoskeletal:        General: Normal range of motion.     Cervical back: Normal range of motion.  Skin:    Findings: No erythema.  Neurological:     Mental Status: She is alert and oriented to person, place, and time. Mental status is at baseline.     Cranial Nerves: No cranial nerve deficit.     Motor: No abnormal muscle tone.  Psychiatric:        Mood and Affect: Affect normal.      LABORATORY DATA:  I have reviewed the data as listed    Latest Ref Rng & Units 02/22/2024    8:39 AM 02/01/2024    9:06 AM 01/11/2024    8:50 AM  CBC  WBC 4.0 - 10.5 K/uL 6.5  7.2  6.4   Hemoglobin 12.0 - 15.0 g/dL 89.3  88.5  88.8   Hematocrit 36.0 - 46.0 % 33.1  35.1  34.4   Platelets 150 - 400 K/uL 282  326  294       Latest Ref Rng & Units 02/22/2024    8:39 AM 02/01/2024    9:06 AM 01/11/2024    8:50 AM  CMP  Glucose 70 - 99 mg/dL 875  99  859   BUN 8 - 23 mg/dL 20  18  15    Creatinine 0.44 - 1.00 mg/dL 9.04  9.06  9.02   Sodium 135 - 145 mmol/L 139  134  137   Potassium 3.5 - 5.1 mmol/L 3.3  3.5  3.2   Chloride 98 - 111 mmol/L 104  99  101   CO2 22 - 32 mmol/L 25  27  27    Calcium  8.9 - 10.3 mg/dL 9.3  9.4  9.5   Total Protein 6.5 - 8.1 g/dL 7.3  7.3  7.6   Total Bilirubin 0.0 - 1.2 mg/dL 0.5  0.5  0.4   Alkaline Phos 38 - 126 U/L 65  67  66   AST 15 - 41 U/L 26  27  28    ALT 0 - 44 U/L 16  15  14       RADIOGRAPHIC STUDIES: I have personally reviewed the radiological images as listed and agreed with the findings in the  report. CT CHEST ABDOMEN PELVIS WO CONTRAST Result Date: 02/18/2024 CLINICAL DATA:  Lung cancer restaging * Tracking Code: BO * EXAM: CT CHEST, ABDOMEN AND PELVIS WITHOUT CONTRAST TECHNIQUE: Multidetector CT imaging of the chest, abdomen and pelvis was performed following the standard protocol without IV contrast. Oral enteric contrast was administered. RADIATION DOSE REDUCTION: This exam was performed according to the departmental dose-optimization program which includes automated exposure control, adjustment of the mA and/or kV according to patient size and/or use of iterative reconstruction technique. COMPARISON:  09/21/2023 FINDINGS: CT CHEST FINDINGS Cardiovascular: Right chest port catheter. Aortic atherosclerosis. Normal heart size. Left and right coronary artery calcifications. No pericardial effusion. Mediastinum/Nodes: No significant change in matted soft tissue or treated lymph nodes in the prevascular station measuring 2.7 x 1.2 cm (series 2, image 22). No discretely enlarged mediastinal, hilar, or axillary lymph nodes. Thyroid  gland, trachea, and esophagus demonstrate no significant findings. Lungs/Pleura: Mild centrilobular emphysema. Unchanged bilateral irregular pulmonary nodules, for example in the peripheral left upper lobe measuring 0.8 x  0.7 cm (series 4, image 41) and multiple ground-glass opacities, for example in the peripheral posterior right upper lobe measuring 1.0 x 0.9 cm (series 4, image 50). Unchanged consolidative opacity of the paramedian left upper lobe measuring 1.7 x 1.1 cm (series 4, image 52). No pleural effusion or pneumothorax. Musculoskeletal: No chest wall abnormality. No acute osseous findings. CT ABDOMEN PELVIS FINDINGS Hepatobiliary: No solid liver abnormality is seen. Unchanged liver cysts. Gallstones. No gallbladder wall thickening, or biliary dilatation. Pancreas: Unremarkable. No pancreatic ductal dilatation or surrounding inflammatory changes. Spleen: Normal in  size without significant abnormality. Adrenals/Urinary Tract: Adrenal glands are unremarkable. Right-sided double-J ureteral stent catheter with formed pigtails in the right renal pelvis and urinary bladder. Unchanged mild right hydronephrosis. Small nonobstructive left calculus. No left-sided hydronephrosis. Endoluminal mass of the right aspect of the urinary bladder, better seen on today's examination and appears to have enlarged at 3.1 x 1.7 cm, difficult to confidently measure on prior although approximately 1.9 x 1.2 cm (series 2, image 103). Stomach/Bowel: Stomach is within normal limits. Appendix appears normal. No evidence of bowel wall thickening, distention, or inflammatory changes. Vascular/Lymphatic: Aortic atherosclerosis. No enlarged abdominal or pelvic lymph nodes. Reproductive: Hysterectomy. Other: No abdominal wall hernia or abnormality. No ascites. Musculoskeletal: No acute osseous findings. IMPRESSION: 1. Unchanged bilateral pulmonary nodules and consolidative opacity of the paramedian left upper lobe. 2. No significant change in matted soft tissue or treated lymph nodes in the prevascular station. No discretely enlarged mediastinal, hilar, or axillary lymph nodes. 3. Endoluminal mass of the right aspect of the urinary bladder, better seen on today's examination and appears to have enlarged at 3.1 x 1.7 cm, difficult to confidently measure on prior although approximately 1.9 x 1.2 cm. This is most consistent with a primary bladder malignancy. 4. Right-sided double-J ureteral stent catheter with formed pigtails in the right renal pelvis and urinary bladder. Unchanged mild right hydronephrosis. 5. Cholelithiasis. 6. Coronary artery disease. Aortic Atherosclerosis (ICD10-I70.0) and Emphysema (ICD10-J43.9). Electronically Signed   By: Marolyn JONETTA Jaksch M.D.   On: 02/18/2024 08:23

## 2024-02-22 NOTE — Assessment & Plan Note (Signed)
 12/21/2022 Non-muscle invasive bladder cancer. S/p TURBT, T1 lesion, high grade.  03/29/2023 Non-muscle invasive bladder cancer. S/p TURBT Patient is s/p intravesical BCG treatments-she is off  suppressive antibiotics Discussed with urology. Oct 2025 CT showed increased bladder mass. She missed her 6th dose of BCG and is overdue for her cystoscopy.  Recommend patient to Follow-up with urology

## 2024-02-22 NOTE — Assessment & Plan Note (Signed)
 Immunotherapy treatment plan as listed above.

## 2024-02-22 NOTE — Assessment & Plan Note (Signed)
 Chronic anemia.  Hemoglobin is stable

## 2024-02-22 NOTE — Assessment & Plan Note (Signed)
 Non-small cell lung cancer, favor squamous cell carcinoma, stage IV- TPS 90%, TMB 10, KRAS G12V MRI brain with and without contrast -  Left occipital focus of prior hemorrhage with mild linear contrast enhancement. Possible small metastasis or microhemmorhagic event.  TPS 90%, CT in Oct 2025 showed stable disease Labs are reviewed and discussed with patient.  Proceed with Keytruda 

## 2024-03-02 ENCOUNTER — Ambulatory Visit: Admitting: Urology

## 2024-03-02 ENCOUNTER — Encounter: Payer: Self-pay | Admitting: Urology

## 2024-03-02 VITALS — BP 176/81 | HR 88 | Ht 63.0 in | Wt 114.0 lb

## 2024-03-02 DIAGNOSIS — C679 Malignant neoplasm of bladder, unspecified: Secondary | ICD-10-CM | POA: Diagnosis not present

## 2024-03-02 DIAGNOSIS — C661 Malignant neoplasm of right ureter: Secondary | ICD-10-CM

## 2024-03-02 DIAGNOSIS — N3289 Other specified disorders of bladder: Secondary | ICD-10-CM

## 2024-03-02 NOTE — H&P (View-Only) (Signed)
 03/02/2024 9:24 AM   Regina Perkins Jan 17, 1941 [id number]  Referring provider: [facility] [address] [place],  [place] [address]  Chief Complaint  Patient presents with   Other   Urologic history:  1.  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  Muscle present and negative Restaging biopsies 03/29/2023 with high-grade urothelial carcinoma without muscle invasion.  <2 mm papillary tumor right distal ureter biopsied and ablated BCG induction started 05/09/2023 x 5 instillations   HPI: Regina Perkins is a 83 y.o. female presents for a follow-up visit.  She started BCG induction and did not return for her 6 treatment.  She was lost to follow-up as a post BCG cystoscopy was not scheduled Was recently contacted by Dr. Babara for CT chest/abdomen/pelvis 02/15/2024 showing a 3.1 x 1.7 cm mass of the right bladder wall.  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, cN3, cM1a) favor squamous cell carcinoma --> TPS 90%, TMB 10, KRAS G12V   Malnutrition    Normocytic anemia    Pericardial effusion 05/2022   Pulmonary embolism (HCC)    Sepsis (HCC)    Tobacco abuse    Type II diabetes mellitus with renal manifestations (HCC)    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   a.) stage I (cT1, cN0, cM0)    Surgical History: Past Surgical History:  Procedure Laterality Date   ABDOMINAL HYSTERECTOMY     BACK SURGERY     1988 and 1998   CYSTOSCOPY W/ RETROGRADES Bilateral 12/21/2022   Procedure: CYSTOSCOPY WITH RETROGRADE PYELOGRAM;  Surgeon: Twylla Glendia BROCKS, MD;  Location: [facility];  Service: Urology;  Laterality: Bilateral;   CYSTOSCOPY WITH BIOPSY N/A 03/29/2023   Procedure: CYSTOSCOPY WITH BLADDER BIOPSY;  Surgeon: Twylla Glendia BROCKS, MD;  Location: [facility];  Service: Urology;  Laterality: N/A;   CYSTOSCOPY WITH STENT PLACEMENT Right 03/29/2023   Procedure: CYSTOSCOPY WITH STENT PLACEMENT;  Surgeon: Twylla Glendia BROCKS, MD;  Location: [facility];  Service: Urology;  Laterality: Right;   ESOPHAGOGASTRODUODENOSCOPY (EGD) WITH PROPOFOL  N/A 07/29/2022   Procedure: ESOPHAGOGASTRODUODENOSCOPY (EGD) WITH PROPOFOL ;  Surgeon: Therisa Bi, MD;  Location: [facility];  Service: Gastroenterology;  Laterality: N/A;   ESOPHAGOGASTRODUODENOSCOPY (EGD) WITH PROPOFOL  N/A 01/21/2023   Procedure: ESOPHAGOGASTRODUODENOSCOPY (EGD) WITH PROPOFOL ;  Surgeon: Therisa Bi, MD;  Location: [facility];  Service: Gastroenterology;  Laterality: N/A;   HOLMIUM LASER APPLICATION Right 03/29/2023   Procedure: HOLMIUM LASER ABLATION;  Surgeon: Twylla Glendia BROCKS, MD;  Location: [facility];  Service: Urology;  Laterality: Right;   IR IMAGING GUIDED PORT INSERTION  01/10/2023   TRANSURETHRAL RESECTION OF BLADDER TUMOR N/A 12/21/2022   Procedure: TRANSURETHRAL RESECTION OF BLADDER TUMOR (TURBT);  Surgeon: Twylla Glendia BROCKS, MD;  Location: [facility];   Service: Urology;  Laterality: N/A;   TRANSURETHRAL RESECTION OF BLADDER TUMOR N/A 03/29/2023   Procedure: TRANSURETHRAL RESECTION OF BLADDER TUMOR (TURBT);  Surgeon: Twylla Glendia BROCKS, MD;  Location: [facility];  Service: Urology;  Laterality: N/A;   URETEROSCOPY Right 03/29/2023   Procedure: DIAGNOSTIC URETEROSCOPY;  Surgeon: Twylla Glendia BROCKS, MD;  Location: [facility];  Service: Urology;  Laterality: Right;    Home Medications:  Allergies as of 03/02/2024       Reactions   Ivp Dye [iodinated Contrast Media]    Shellfish Allergy Other (See Comments)   Reaction: unknown         Medication List        Accurate as of March 02, 2024  9:24 AM. If you have any questions, ask your nurse or doctor.          albuterol  108 (90 Base) MCG/ACT inhaler Commonly known as: VENTOLIN  HFA Inhale 2 puffs into the lungs every 4 (four) hours as needed for wheezing or shortness of breath.   amLODipine  10 MG tablet Commonly known as: NORVASC  Take 10 mg by mouth every morning.   aspirin  EC 81 MG tablet Take 1 tablet (81 mg total) by mouth daily. Swallow whole.   cyanocobalamin  1000 MCG tablet Commonly known as: VITAMIN B12 Take 1 tablet (1,000 mcg total) by mouth daily.   fluticasone -salmeterol 250-50 MCG/ACT Aepb Commonly known as: ADVAIR Inhale 1 puff into the lungs.   lidocaine -prilocaine  cream Commonly known as: EMLA  Apply 1 Application topically as needed. Apply to port and cover with saran wrap 1-2 hours prior to port access   lisinopril  40 MG tablet Commonly known as: ZESTRIL  Take 40 mg by mouth every morning.   loratadine  10 MG tablet Commonly known as: CLARITIN  Take 10 mg by mouth daily.   meloxicam 7.5 MG tablet Commonly known as: MOBIC Take 7.5-15 mg by mouth daily.   metFORMIN  1000 MG tablet Commonly known as: GLUCOPHAGE  Take 1,000 mg by mouth daily with breakfast. What changed: how much to take   OXYGEN Inhale 2 L into the lungs as directed.         Allergies:  Allergies  Allergen Reactions   Ivp Dye [Iodinated Contrast Media]    Shellfish Allergy Other (See Comments)    Reaction: unknown     Family History: Family History  Problem Relation Age of Onset   Asthma Mother    Heart disease Mother    Hypertension Mother    Diabetes Mother    Stroke Mother    Diabetes Sister    Diabetes Brother     Social History:  reports that she has been smoking cigarettes. She has never used smokeless tobacco. She reports that she does not drink alcohol and does not use drugs.   Physical Exam: BP (!) 176/81   Pulse 88   Ht 5' 3 (1.6 m)   Wt 114 lb (51.7 kg)   BMI 20.19 kg/m   Constitutional:  Alert, No acute distress. HEENT: [place] AT Respiratory: Normal respiratory effort, no increased work of breathing. Psychiatric: Normal mood and affect.   Assessment & Plan:    1.  Urothelial carcinoma bladder High-grade T1 Recent CT  with bladder mass consistent with a recurrent tumor CT findings were discussed and recommend cystoscopy under anesthesia with TURBT, right ureteral stent removal with right ureteroscopy and possible biopsy/ureteral tumor ablation The procedure was discussed in detail including potential risks of bleeding, infection/sepsis and bladder/ureteral injury.  All questions were answered and she desires to proceed   Glendia JAYSON Barba, MD  [facility] [address], [address] [place], [place] [address] [phone]

## 2024-03-02 NOTE — Progress Notes (Signed)
 03/02/2024 9:24 AM   Regina Perkins Jan 17, 1941 [id number]  Referring provider: [facility] [address] [place],  [place] [address]  Chief Complaint  Patient presents with   Other   Urologic history:  1.  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  Muscle present and negative Restaging biopsies 03/29/2023 with high-grade urothelial carcinoma without muscle invasion.  <2 mm papillary tumor right distal ureter biopsied and ablated BCG induction started 05/09/2023 x 5 instillations   HPI: Regina Perkins is a 83 y.o. female presents for a follow-up visit.  She started BCG induction and did not return for her 6 treatment.  She was lost to follow-up as a post BCG cystoscopy was not scheduled Was recently contacted by Dr. Babara for CT chest/abdomen/pelvis 02/15/2024 showing a 3.1 x 1.7 cm mass of the right bladder wall.  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, cN3, cM1a) favor squamous cell carcinoma --> TPS 90%, TMB 10, KRAS G12V   Malnutrition    Normocytic anemia    Pericardial effusion 05/2022   Pulmonary embolism (HCC)    Sepsis (HCC)    Tobacco abuse    Type II diabetes mellitus with renal manifestations (HCC)    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   a.) stage I (cT1, cN0, cM0)    Surgical History: Past Surgical History:  Procedure Laterality Date   ABDOMINAL HYSTERECTOMY     BACK SURGERY     1988 and 1998   CYSTOSCOPY W/ RETROGRADES Bilateral 12/21/2022   Procedure: CYSTOSCOPY WITH RETROGRADE PYELOGRAM;  Surgeon: Twylla Glendia BROCKS, MD;  Location: [facility];  Service: Urology;  Laterality: Bilateral;   CYSTOSCOPY WITH BIOPSY N/A 03/29/2023   Procedure: CYSTOSCOPY WITH BLADDER BIOPSY;  Surgeon: Twylla Glendia BROCKS, MD;  Location: [facility];  Service: Urology;  Laterality: N/A;   CYSTOSCOPY WITH STENT PLACEMENT Right 03/29/2023   Procedure: CYSTOSCOPY WITH STENT PLACEMENT;  Surgeon: Twylla Glendia BROCKS, MD;  Location: [facility];  Service: Urology;  Laterality: Right;   ESOPHAGOGASTRODUODENOSCOPY (EGD) WITH PROPOFOL  N/A 07/29/2022   Procedure: ESOPHAGOGASTRODUODENOSCOPY (EGD) WITH PROPOFOL ;  Surgeon: Therisa Bi, MD;  Location: [facility] END[facility];  Service: Gastroenterology;  Laterality: N/A;   ESOPHAGOGASTRODUODENOSCOPY (EGD) WITH PROPOFOL  N/A 01/21/2023   Procedure: ESOPHAGOGASTRODUODENOSCOPY (EGD) WITH PROPOFOL ;  Surgeon: Therisa Bi, MD;  Location: [facility];  Service: Gastroenterology;  Laterality: N/A;   HOLMIUM LASER APPLICATION Right 03/29/2023   Procedure: HOLMIUM LASER ABLATION;  Surgeon: Twylla Glendia BROCKS, MD;  Location: [facility];  Service: Urology;  Laterality: Right;   IR IMAGING GUIDED PORT INSERTION  01/10/2023   TRANSURETHRAL RESECTION OF BLADDER TUMOR N/A 12/21/2022   Procedure: TRANSURETHRAL RESECTION OF BLADDER TUMOR (TURBT);  Surgeon: Twylla Glendia BROCKS, MD;  Location: [facility];   Service: Urology;  Laterality: N/A;   TRANSURETHRAL RESECTION OF BLADDER TUMOR N/A 03/29/2023   Procedure: TRANSURETHRAL RESECTION OF BLADDER TUMOR (TURBT);  Surgeon: Twylla Glendia BROCKS, MD;  Location: [facility];  Service: Urology;  Laterality: N/A;   URETEROSCOPY Right 03/29/2023   Procedure: DIAGNOSTIC URETEROSCOPY;  Surgeon: Twylla Glendia BROCKS, MD;  Location: [facility];  Service: Urology;  Laterality: Right;    Home Medications:  Allergies as of 03/02/2024       Reactions   Ivp Dye [iodinated Contrast Media]    Shellfish Allergy Other (See Comments)   Reaction: unknown         Medication List        Accurate as of March 02, 2024  9:24 AM. If you have any questions, ask your nurse or doctor.          albuterol  108 (90 Base) MCG/ACT inhaler Commonly known as: VENTOLIN  HFA Inhale 2 puffs into the lungs every 4 (four) hours as needed for wheezing or shortness of breath.   amLODipine  10 MG tablet Commonly known as: NORVASC  Take 10 mg by mouth every morning.   aspirin  EC 81 MG tablet Take 1 tablet (81 mg total) by mouth daily. Swallow whole.   cyanocobalamin  1000 MCG tablet Commonly known as: VITAMIN B12 Take 1 tablet (1,000 mcg total) by mouth daily.   fluticasone -salmeterol 250-50 MCG/ACT Aepb Commonly known as: ADVAIR Inhale 1 puff into the lungs.   lidocaine -prilocaine  cream Commonly known as: EMLA  Apply 1 Application topically as needed. Apply to port and cover with saran wrap 1-2 hours prior to port access   lisinopril  40 MG tablet Commonly known as: ZESTRIL  Take 40 mg by mouth every morning.   loratadine  10 MG tablet Commonly known as: CLARITIN  Take 10 mg by mouth daily.   meloxicam 7.5 MG tablet Commonly known as: MOBIC Take 7.5-15 mg by mouth daily.   metFORMIN  1000 MG tablet Commonly known as: GLUCOPHAGE  Take 1,000 mg by mouth daily with breakfast. What changed: how much to take   OXYGEN Inhale 2 L into the lungs as directed.         Allergies:  Allergies  Allergen Reactions   Ivp Dye [Iodinated Contrast Media]    Shellfish Allergy Other (See Comments)    Reaction: unknown     Family History: Family History  Problem Relation Age of Onset   Asthma Mother    Heart disease Mother    Hypertension Mother    Diabetes Mother    Stroke Mother    Diabetes Sister    Diabetes Brother     Social History:  reports that she has been smoking cigarettes. She has never used smokeless tobacco. She reports that she does not drink alcohol and does not use drugs.   Physical Exam: BP (!) 176/81   Pulse 88   Ht 5' 3 (1.6 m)   Wt 114 lb (51.7 kg)   BMI 20.19 kg/m   Constitutional:  Alert, No acute distress. HEENT: [place] AT Respiratory: Normal respiratory effort, no increased work of breathing. Psychiatric: Normal mood and affect.   Assessment & Plan:    1.  Urothelial carcinoma bladder High-grade T1 Recent CT  with bladder mass consistent with a recurrent tumor CT findings were discussed and recommend cystoscopy under anesthesia with TURBT, right ureteral stent removal with right ureteroscopy and possible biopsy/ureteral tumor ablation The procedure was discussed in detail including potential risks of bleeding, infection/sepsis and bladder/ureteral injury.  All questions were answered and she desires to proceed   Glendia JAYSON Barba, MD  [facility] [address], [address] [place], [place] [address] [phone]

## 2024-03-05 ENCOUNTER — Other Ambulatory Visit: Payer: Self-pay

## 2024-03-05 DIAGNOSIS — C661 Malignant neoplasm of right ureter: Secondary | ICD-10-CM

## 2024-03-05 DIAGNOSIS — C679 Malignant neoplasm of bladder, unspecified: Secondary | ICD-10-CM

## 2024-03-05 DIAGNOSIS — N3289 Other specified disorders of bladder: Secondary | ICD-10-CM

## 2024-03-05 NOTE — Progress Notes (Unsigned)
 Surgical Physician Order Form [facility] [place]  Dr. Glendia Barba, MD  * Scheduling expectation : Next Available  *Length of Case: 90 min  *Clearance needed: per anesthesia  *Anticoagulation Instructions: N/A  *Aspirin  Instructions: Hold Aspirin   *Post-op visit Date/Instructions:  TBD  *Diagnosis: bladder mass; bladder cancer; h/o carcinoma rt ureter  *Procedure:  TURBT 2-5cm (47764); removal vs exchange right ureteral stent; right ureteroscopy with possible biopsy/ tumor ablation;   Additional orders: Gemcitabine 2000mg  bladder instillation  -Admit type: OUTpatient  -Anesthesia: General  -VTE Prophylaxis Standing Order SCD's       Other:   -Standing Lab Orders Per Anesthesia    Lab other: UA&Urine Culture  -Standing Test orders EKG/Chest x-ray per Anesthesia       Test other:   - Medications:  Ancef  2gm IV  -Other orders:  N/A

## 2024-03-06 ENCOUNTER — Telehealth: Payer: Self-pay

## 2024-03-06 NOTE — Telephone Encounter (Signed)
 Per Dr. Twylla, Patient is to be scheduled for Transurethral Resection of Bladder Tumor, Right Ureteroscopy, Possible Removal versus Exchange of Right Ureteral Stent, Right Ureteral Biopsy/Ablation, Intravesical Instillation of Gemcitabine   Mrs. Delapena was contacted and possible surgical dates were discussed, Thursday December 4th, 2025 was agreed upon for surgery.  Patient was instructed that Dr. Twylla will require them to provide a pre-op UA & CX prior to surgery. This was ordered and scheduled drop off appointment was made for 03/14/2024.    Patient was directed to call [phone] between 1-3pm the day before surgery to find out surgical arrival time.  Instructions were given not to eat or drink from midnight on the night before surgery and have a driver for the day of surgery. On the surgery day patient was instructed to enter through the [facility] entrance of [facility] report the Same Day Surgery desk.   Pre-Admit Testing will be in contact via phone to set up an interview with the anesthesia team to review your history and medications prior to surgery.   Reminder of this information was sent via Mail to the patient.

## 2024-03-06 NOTE — Progress Notes (Signed)
   [facility] Surgical Posting Form  Surgery Date: Date: 03/29/2024  Surgeon: Dr. 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, C67.9 Urothelial Carcinoma of Bladder  -CPT: 52235, 47667, 52310, 52351, 47645, 51720  Surgery: Transurethral Resection of Bladder Tumor, Right Ureteroscopy, Possible Removal versus Exchange of Right Ureteral Stent, Right Ureteral Biopsy/Ablation, Intravesical Instillation of Gemcitabine  Stop Anticoagulations: Yes, will hold ASA for 3 days prior  Cardiac/Medical/Pulmonary Clearance needed: per Anesthesia  *Orders entered into EPIC  Date: 03/06/24   *Case booked in EPIC  Date: 03/06/24  *Notified pt of Surgery: Date: 03/06/24  PRE-OP UA & CX: yes, will obtain in clinic on 03/14/2024  *Placed into Prior Authorization Work [place] Date: 03/06/24  Assistant/laser/rep:Yes

## 2024-03-14 ENCOUNTER — Other Ambulatory Visit

## 2024-03-14 ENCOUNTER — Inpatient Hospital Stay: Attending: Oncology

## 2024-03-14 ENCOUNTER — Inpatient Hospital Stay

## 2024-03-14 ENCOUNTER — Encounter: Payer: Self-pay | Admitting: Oncology

## 2024-03-14 ENCOUNTER — Other Ambulatory Visit: Payer: Self-pay

## 2024-03-14 ENCOUNTER — Inpatient Hospital Stay (HOSPITAL_BASED_OUTPATIENT_CLINIC_OR_DEPARTMENT_OTHER): Admitting: Oncology

## 2024-03-14 VITALS — BP 162/70 | HR 91

## 2024-03-14 VITALS — BP 157/96 | HR 88 | Temp 97.7°F | Resp 16 | Wt 115.9 lb

## 2024-03-14 DIAGNOSIS — N1831 Chronic kidney disease, stage 3a: Secondary | ICD-10-CM | POA: Insufficient documentation

## 2024-03-14 DIAGNOSIS — C3412 Malignant neoplasm of upper lobe, left bronchus or lung: Secondary | ICD-10-CM | POA: Insufficient documentation

## 2024-03-14 DIAGNOSIS — Z5112 Encounter for antineoplastic immunotherapy: Secondary | ICD-10-CM | POA: Insufficient documentation

## 2024-03-14 DIAGNOSIS — Z79899 Other long term (current) drug therapy: Secondary | ICD-10-CM | POA: Diagnosis not present

## 2024-03-14 DIAGNOSIS — C3482 Malignant neoplasm of overlapping sites of left bronchus and lung: Secondary | ICD-10-CM | POA: Diagnosis not present

## 2024-03-14 DIAGNOSIS — C679 Malignant neoplasm of bladder, unspecified: Secondary | ICD-10-CM

## 2024-03-14 DIAGNOSIS — F17210 Nicotine dependence, cigarettes, uncomplicated: Secondary | ICD-10-CM | POA: Insufficient documentation

## 2024-03-14 DIAGNOSIS — Z8551 Personal history of malignant neoplasm of bladder: Secondary | ICD-10-CM | POA: Diagnosis not present

## 2024-03-14 DIAGNOSIS — D494 Neoplasm of unspecified behavior of bladder: Secondary | ICD-10-CM

## 2024-03-14 DIAGNOSIS — C787 Secondary malignant neoplasm of liver and intrahepatic bile duct: Secondary | ICD-10-CM | POA: Insufficient documentation

## 2024-03-14 DIAGNOSIS — Z7951 Long term (current) use of inhaled steroids: Secondary | ICD-10-CM | POA: Insufficient documentation

## 2024-03-14 DIAGNOSIS — D649 Anemia, unspecified: Secondary | ICD-10-CM

## 2024-03-14 DIAGNOSIS — I129 Hypertensive chronic kidney disease with stage 1 through stage 4 chronic kidney disease, or unspecified chronic kidney disease: Secondary | ICD-10-CM | POA: Diagnosis not present

## 2024-03-14 DIAGNOSIS — Z7984 Long term (current) use of oral hypoglycemic drugs: Secondary | ICD-10-CM | POA: Insufficient documentation

## 2024-03-14 DIAGNOSIS — E1122 Type 2 diabetes mellitus with diabetic chronic kidney disease: Secondary | ICD-10-CM | POA: Insufficient documentation

## 2024-03-14 DIAGNOSIS — Z7982 Long term (current) use of aspirin: Secondary | ICD-10-CM | POA: Insufficient documentation

## 2024-03-14 DIAGNOSIS — C661 Malignant neoplasm of right ureter: Secondary | ICD-10-CM

## 2024-03-14 DIAGNOSIS — N3289 Other specified disorders of bladder: Secondary | ICD-10-CM

## 2024-03-14 DIAGNOSIS — R634 Abnormal weight loss: Secondary | ICD-10-CM

## 2024-03-14 DIAGNOSIS — C771 Secondary and unspecified malignant neoplasm of intrathoracic lymph nodes: Secondary | ICD-10-CM | POA: Insufficient documentation

## 2024-03-14 DIAGNOSIS — Z86711 Personal history of pulmonary embolism: Secondary | ICD-10-CM | POA: Diagnosis not present

## 2024-03-14 LAB — CBC WITH DIFFERENTIAL (CANCER CENTER ONLY)
Abs Immature Granulocytes: 0.02 K/uL (ref 0.00–0.07)
Basophils Absolute: 0 K/uL (ref 0.0–0.1)
Basophils Relative: 0 %
Eosinophils Absolute: 0.3 K/uL (ref 0.0–0.5)
Eosinophils Relative: 4 %
HCT: 35.9 % — ABNORMAL LOW (ref 36.0–46.0)
Hemoglobin: 11.5 g/dL — ABNORMAL LOW (ref 12.0–15.0)
Immature Granulocytes: 0 %
Lymphocytes Relative: 21 %
Lymphs Abs: 1.6 K/uL (ref 0.7–4.0)
MCH: 28.5 pg (ref 26.0–34.0)
MCHC: 32 g/dL (ref 30.0–36.0)
MCV: 89.1 fL (ref 80.0–100.0)
Monocytes Absolute: 0.6 K/uL (ref 0.1–1.0)
Monocytes Relative: 8 %
Neutro Abs: 4.9 K/uL (ref 1.7–7.7)
Neutrophils Relative %: 67 %
Platelet Count: 241 K/uL (ref 150–400)
RBC: 4.03 MIL/uL (ref 3.87–5.11)
RDW: 16.9 % — ABNORMAL HIGH (ref 11.5–15.5)
WBC Count: 7.4 K/uL (ref 4.0–10.5)
nRBC: 0 % (ref 0.0–0.2)

## 2024-03-14 LAB — MICROSCOPIC EXAMINATION

## 2024-03-14 LAB — CMP (CANCER CENTER ONLY)
ALT: 16 U/L (ref 0–44)
AST: 25 U/L (ref 15–41)
Albumin: 3.8 g/dL (ref 3.5–5.0)
Alkaline Phosphatase: 69 U/L (ref 38–126)
Anion gap: 8 (ref 5–15)
BUN: 22 mg/dL (ref 8–23)
CO2: 27 mmol/L (ref 22–32)
Calcium: 9.2 mg/dL (ref 8.9–10.3)
Chloride: 103 mmol/L (ref 98–111)
Creatinine: 1.18 mg/dL — ABNORMAL HIGH (ref 0.44–1.00)
GFR, Estimated: 46 mL/min — ABNORMAL LOW (ref >60)
Glucose, Bld: 134 mg/dL — ABNORMAL HIGH (ref 70–99)
Potassium: 3.7 mmol/L (ref 3.5–5.1)
Sodium: 138 mmol/L (ref 135–145)
Total Bilirubin: 0.5 mg/dL (ref 0.0–1.2)
Total Protein: 7.3 g/dL (ref 6.5–8.1)

## 2024-03-14 LAB — TSH: TSH: 1.19 u[IU]/mL (ref 0.350–4.500)

## 2024-03-14 MED ORDER — SODIUM CHLORIDE 0.9 % IV SOLN
Freq: Once | INTRAVENOUS | Status: AC
  Filled 2024-03-14: qty 250

## 2024-03-14 MED ORDER — SODIUM CHLORIDE 0.9 % IV SOLN
200.0000 mg | Freq: Once | INTRAVENOUS | Status: AC
  Administered 2024-03-14: 200 mg via INTRAVENOUS
  Filled 2024-03-14: qty 8

## 2024-03-14 NOTE — Assessment & Plan Note (Signed)
 Non-small cell lung cancer, favor squamous cell carcinoma, stage IV- TPS 90%, TMB 10, KRAS G12V MRI brain with and without contrast -  Left occipital focus of prior hemorrhage with mild linear contrast enhancement. Possible small metastasis or microhemmorhagic event.  TPS 90%, CT in Oct 2025 showed stable disease Labs are reviewed and discussed with patient.  Proceed with Keytruda 

## 2024-03-14 NOTE — Assessment & Plan Note (Signed)
 12/21/2022 Non-muscle invasive bladder cancer. S/p TURBT, T1 lesion, high grade.  03/29/2023 Non-muscle invasive bladder cancer. S/p TURBT Patient is s/p intravesical BCG treatments-she is off  suppressive antibiotics Discussed with urology. Oct 2025 CT showed increased bladder mass.  She has reestablished with urology and is scheduled to repeat cystoscopy.

## 2024-03-14 NOTE — Assessment & Plan Note (Signed)
 Chronic anemia.  Hemoglobin is stable

## 2024-03-14 NOTE — Patient Instructions (Signed)
 [facility] - A DEPT OF [facility]  Discharge Instructions: Thank you for choosing [facility] to provide your oncology and hematology care.  If you have a lab appointment with the Cancer Center, please go directly to the Cancer Center and check in at the registration area.  Wear comfortable clothing and clothing appropriate for easy access to any Portacath or PICC line.   We strive to give you quality time with your provider. You may need to reschedule your appointment if you arrive late (15 or more minutes).  Arriving late affects you and other patients whose appointments are after yours.  Also, if you miss three or more appointments without notifying the office, you may be dismissed from the clinic at the provider's discretion.      For prescription refill requests, have your pharmacy contact our office and allow 72 hours for refills to be completed.    Today you received the following chemotherapy and/or immunotherapy agents Keytruda        To help prevent nausea and vomiting after your treatment, we encourage you to take your nausea medication as directed.  BELOW ARE SYMPTOMS THAT SHOULD BE REPORTED IMMEDIATELY: *FEVER GREATER THAN 100.4 F (38 C) OR HIGHER *CHILLS OR SWEATING *NAUSEA AND VOMITING THAT IS NOT CONTROLLED WITH YOUR NAUSEA MEDICATION *UNUSUAL SHORTNESS OF BREATH *UNUSUAL BRUISING OR BLEEDING *URINARY PROBLEMS (pain or burning when urinating, or frequent urination) *BOWEL PROBLEMS (unusual diarrhea, constipation, pain near the anus) TENDERNESS IN MOUTH AND THROAT WITH OR WITHOUT PRESENCE OF ULCERS (sore throat, sores in mouth, or a toothache) UNUSUAL RASH, SWELLING OR PAIN  UNUSUAL VAGINAL DISCHARGE OR ITCHING   Items with * indicate a potential emergency and should be followed up as soon as possible or go to the Emergency Department if any problems should occur.  Please show the CHEMOTHERAPY ALERT CARD or IMMUNOTHERAPY  ALERT CARD at check-in to the Emergency Department and triage nurse.  Should you have questions after your visit or need to cancel or reschedule your appointment, please contact [facility] - A DEPT OF [facility]  [phone] and follow the prompts.  Office hours are 8:00 a.m. to 4:30 p.m. Monday - Friday. Please note that voicemails left after 4:00 p.m. may not be returned until the following business day.  We are closed weekends and major holidays. You have access to a nurse at all times for urgent questions. Please call the main number to the clinic [phone] and follow the prompts.  For any non-urgent questions, you may also contact your provider using MyChart. We now offer e-Visits for anyone 3 and older to request care online for non-urgent symptoms. For details visit [email or web address].   Also download the MyChart app! Go to the app store, search MyChart, open the app, select [facility], and log in with your MyChart username and password.

## 2024-03-14 NOTE — Progress Notes (Signed)
 Hematology/Oncology Progress note [phone] [phone]       REASON OF VISIT Stage IV Lung squamous cell carcinoma  ASSESSMENT & PLAN:   Cancer Staging  Malignant neoplasm of lung (HCC) Staging form: Lung, AJCC 8th Edition - Clinical: Stage IVA (cT4, cN3, cM1a) - Signed by Babara Call, MD on 06/28/2022  Urothelial carcinoma of bladder [facility]) Staging form: Urinary Bladder, AJCC 8th Edition - Clinical stage from 12/21/2022: Stage I (cT1, cN0, cM0) - Signed by Babara Call, MD on 12/29/2022   Malignant neoplasm of lung (HCC) Non-small cell lung cancer, favor squamous cell carcinoma, stage IV- TPS 90%, TMB 10, KRAS G12V MRI brain with and without contrast -  Left occipital focus of prior hemorrhage with mild linear contrast enhancement. Possible small metastasis or microhemmorhagic event.  TPS 90%, CT in Oct 2025 showed stable disease Labs are reviewed and discussed with patient.  Proceed with Keytruda    Encounter for antineoplastic immunotherapy Immunotherapy treatment plan as listed above.  Normocytic anemia Chronic anemia.  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  03/29/2023 Non-muscle invasive bladder cancer. S/p TURBT Patient is s/p intravesical BCG treatments-she is off  suppressive antibiotics Discussed with urology. Oct 2025 CT showed increased bladder mass.  She has reestablished with urology and is scheduled to repeat cystoscopy.    Weight loss Continue nutrition supplement   Orders Placed This Encounter  Procedures   CBC with Differential (Cancer Center Only)    Standing Status:   Future    Expected Date:   04/25/2024    Expiration Date:   04/25/2025   CMP (Cancer Center only)    Standing Status:   Future    Expected Date:   04/25/2024    Expiration Date:   04/25/2025   Follow up in 3 weeks.   All questions were answered. The patient knows  to call the clinic with any problems, questions or concerns.  Call Babara, MD, PhD [facility] Hematology Oncology 03/14/2024       HISTORY OF PRESENTING ILLNESS:  Regina Perkins 83 y.o. 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 I have reviewed her chart and materials related to her cancer extensively and collaborated history with the patient. Summary of oncologic history is as follows: Oncology History  Malignant neoplasm of lung (HCC)  05/24/2022 Imaging   CT chest angiogram showed 1. No evidence for pulmonary embolism. 2. Left upper lobe/prevascular mass worrisome for neoplasm. 3. Mediastinal and hilar lymphadenopathy. 4. Multiple pulmonary nodules measuring up to 11 mm worrisome for metastatic disease. 5. Small left pleural effusion. 6. Patchy ground-glass and airspace opacities in the left upper lobe worrisome for infection. 7. Moderate-sized pericardial effusion. 8. Cholelithiasis. 9. Nonobstructing left renal calculus   05/25/2022 Imaging   CT abdomen pelvis wo contrast 1. No acute findings or explanation for the patient's symptoms. 2. No evidence of primary malignancy or metastatic disease within the abdomen or pelvis on noncontrast imaging.  3. Cholelithiasis without evidence of cholecystitis or biliary dilatation. 4. Nonobstructing left renal calculus. 5. Moderate stool throughout the colon suggesting constipation. 6.  Aortic Atherosclerosis    05/26/2022 Initial Diagnosis   Malignant neoplasm of lung - TPS 90%, TMB 10, KRAS G12V  -05/22/2022 in the emergency room, she was found to have hypoxia with oxygen levels of 86% on room air, improved to 94 with  2 L of oxygen.  D-dimer was elevated at 1.29, troponin negative.  Lower extremity Doppler was negative for DVT.  VQ scan high probability PE (Large segmental perfusion defect of the left upper lobe without corresponding  radiographic abnormality. Additional bilateral wedge-shaped subsegmental branch perfusion defects .  Patient was admitted and started on heparin  Echocardiogram showed no RV strain.  Moderate pericardial effusion. Over her hospitalization, she continues to have shortness of breath with minimal exertion. 05/24/2022, CT chest angiogram showed left lung mass with bilateral lung nodules.   06/16/2022 Imaging   PET scan  1. 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 No evidence of metastatic disease in the abdomen or pelvis. 2. Moderate pericardial effusion. 3. Tiny left pleural effusion. 4. Left renal stone. 5. Aortic atherosclerosis (ICD10-I70.0). Coronary artery calcification. 6. 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    06/28/2022 Cancer Staging   Staging form: Lung, AJCC 8th Edition - Clinical: Stage IVA (cT4, cN3, cM1a) - Signed by Babara Call, MD on 06/28/2022   07/14/2022 -  Chemotherapy   Patient is on Treatment Plan : LUNG NSCLC Pembrolizumab  (200) q21d     09/29/2022 Imaging   CT chest abdomen pelvis w contrast showed Bilateral pulmonary nodules, some of which are waxing/waning, but favoring multifocal lung carcinoma/metastases.   Improving thoracic nodal metastases, as above.  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 Consider cystoscopy as clinically warranted.  Aortic Atherosclerosis (ICD10-I70.0) and Emphysema (ICD10-J43.9)   01/06/2023 Imaging   CT chest abdomen pelvis w contrast showed 1. Interval decrease in  size of multiple spiculated and subsolid pulmonary nodules, consistent with treatment response. 2. No significant change in matted post treatment prevascular and left superior mediastinal soft tissue. 3. Diminished size of an endoluminal bladder mass seen on prior examination, soft tissue in this vicinity now measuring no greater than 2.1 x 1.4 cm, previously at least 4.7 x 2.1 cm. Presumably this has been at least partially resected. 4. No evidence of lymphadenopathy or metastatic disease in the abdomen or pelvis. 5. Cholelithiasis. 6. Nonobstructive left nephrolithiasis. 7. Coronary artery disease.   01/10/2023 Procedure   Medi port placed by IR   05/11/2023 Imaging   CT chest abdomen pelvis w contrast showed 1. Unchanged matted, treated prevascular and left superior mediastinal soft tissue. 2. Multiple bilateral spiculated and ground-glass pulmonary nodules not significantly changed. 3. No residual mass of the posterior right aspect of the urinary bladder. Mild residual thickening in this vicinity. Interval placement of right-sided double-J ureteral stent catheter. 4. No evidence of lymphadenopathy or metastatic disease in the abdomen or pelvis. 5. Emphysema and diffuse bilateral bronchial wall thickening. 6. Coronary artery disease.   Aortic Atherosclerosis (ICD10-I70.0) and Emphysema (ICD10-J43.9).    02/15/2024 Imaging   CT chest abdomen pelvis wo contrast showed  1. Unchanged bilateral pulmonary nodules and consolidative opacity of the paramedian left upper lobe. 2. No significant change in matted soft tissue or treated lymph nodes in the prevascular station. No discretely enlarged mediastinal, hilar, or axillary lymph nodes. 3. Endoluminal mass of the right aspect of the urinary bladder, better seen on today's examination and appears to have enlarged at 3.1 x  1.7 cm, difficult to confidently measure on prior although approximately 1.9 x 1.2 cm. This is most  consistent with a primary bladder malignancy. 4. Right-sided double-J ureteral stent catheter with formed pigtails in the right renal pelvis and urinary bladder. Unchanged mild right hydronephrosis. 5. Cholelithiasis. 6. Coronary artery disease.     Urothelial carcinoma of bladder (HCC)  10/06/2022 Initial Diagnosis   Urothelial carcinoma of bladder S/p TURBT  1. 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 Bladder, biopsy, Base of tumor HIGH GRADE PAPILLARY UROTHELIAL CARCINOMA SUBMUCOSA AND MUSCULARIS PROPRIA IS PRESENT AND NOT INVOLVED BY CARCINOMA  Postprocedure, patient developed gross hematuria and was admitted to the hospital.  She received PRBC transfusion.  Hemoglobin improved and she was discharged.    12/21/2022 Cancer Staging   Staging form: Urinary Bladder, AJCC 8th Edition - Clinical stage from 12/21/2022: Stage I (cT1, cN0, cM0) - Signed by Babara Call, MD on 12/29/2022 WHO/ISUP grade (low/high): High Grade Histologic grading system: 2 grade system   02/15/2024 Imaging   CT chest abdomen pelvis wo contrast showed  1. Unchanged bilateral pulmonary nodules and consolidative opacity of the paramedian left upper lobe. 2. No significant change in matted soft tissue or treated lymph nodes in the prevascular station. No discretely enlarged mediastinal, hilar, or axillary lymph nodes. 3. Endoluminal mass of the right aspect of the urinary bladder, better seen on today's examination and appears to have enlarged at 3.1 x 1.7 cm, difficult to confidently measure on prior although approximately 1.9 x 1.2 cm. This is most consistent with a primary bladder malignancy. 4. Right-sided double-J ureteral stent catheter with formed pigtails in the right renal pelvis and urinary bladder.  Unchanged mild right hydronephrosis. 5. Cholelithiasis. 6. Coronary artery disease.       INTERVAL HISTORY Regina Perkins is a 83 y.o. female who has above history reviewed by me today presents for follow up visit for Lung squamous cell carcinoma She tolerates keytuda well. Denies skin rash, diarrhea.  Patient has no new complaints.  Denies chest pain, chest palpitation. She denies headache, focal weakness or slurred speech.  Was seen by urology.    MEDICAL HISTORY:  Past Medical History:  Diagnosis Date   Atherosclerosis of native arteries of extremity with intermittent claudication    Bladder mass    Chronic kidney disease, stage 3a (HCC)    COPD (chronic obstructive pulmonary disease) (HCC)    Diabetes mellitus without complication (HCC)    Gastrointestinal hemorrhage    GERD (gastroesophageal reflux disease)    Glaucoma    History of duodenal ulcer    HTN (hypertension)    Hypertension    Hypokalemia    Hypomagnesemia    Leukocytosis    Lung mass    Malignant neoplasm of lung (HCC) 06/28/2022   a.) stage IVA (cT4, cN3, cM1a) favor squamous cell carcinoma --> TPS 90%, TMB 10, KRAS G12V   Malnutrition    Normocytic anemia    Pericardial effusion 05/2022   Pulmonary embolism (HCC)    Sepsis (HCC)    Tobacco abuse    Type II diabetes mellitus with renal manifestations (HCC)    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   a.) stage I (cT1, cN0, cM0)    SURGICAL HISTORY: Past Surgical History:  Procedure Laterality Date   ABDOMINAL HYSTERECTOMY     BACK SURGERY  1988 and 1998   CYSTOSCOPY W/ RETROGRADES Bilateral 12/21/2022   Procedure: CYSTOSCOPY WITH RETROGRADE PYELOGRAM;  Surgeon: Twylla Glendia BROCKS, MD;  Location: [facility];  Service: Urology;  Laterality: Bilateral;   CYSTOSCOPY WITH BIOPSY N/A 03/29/2023   Procedure: CYSTOSCOPY WITH BLADDER BIOPSY;  Surgeon: Twylla Glendia BROCKS, MD;  Location: [facility];  Service: Urology;  Laterality: N/A;   CYSTOSCOPY  WITH STENT PLACEMENT Right 03/29/2023   Procedure: CYSTOSCOPY WITH STENT PLACEMENT;  Surgeon: Twylla Glendia BROCKS, MD;  Location: [facility];  Service: Urology;  Laterality: Right;   ESOPHAGOGASTRODUODENOSCOPY (EGD) WITH PROPOFOL  N/A 07/29/2022   Procedure: ESOPHAGOGASTRODUODENOSCOPY (EGD) WITH PROPOFOL ;  Surgeon: Therisa Bi, MD;  Location: [facility] ENDOSCOPY;  Service: Gastroenterology;  Laterality: N/A;   ESOPHAGOGASTRODUODENOSCOPY (EGD) WITH PROPOFOL  N/A 01/21/2023   Procedure: ESOPHAGOGASTRODUODENOSCOPY (EGD) WITH PROPOFOL ;  Surgeon: Therisa Bi, MD;  Location: [facility] ENDOSCOPY;  Service: Gastroenterology;  Laterality: N/A;   HOLMIUM LASER APPLICATION Right 03/29/2023   Procedure: HOLMIUM LASER ABLATION;  Surgeon: Twylla Glendia BROCKS, MD;  Location: [facility];  Service: Urology;  Laterality: Right;   IR IMAGING GUIDED PORT INSERTION  01/10/2023   TRANSURETHRAL RESECTION OF BLADDER TUMOR N/A 12/21/2022   Procedure: TRANSURETHRAL RESECTION OF BLADDER TUMOR (TURBT);  Surgeon: Twylla Glendia BROCKS, MD;  Location: [facility];  Service: Urology;  Laterality: N/A;   TRANSURETHRAL RESECTION OF BLADDER TUMOR N/A 03/29/2023   Procedure: TRANSURETHRAL RESECTION OF BLADDER TUMOR (TURBT);  Surgeon: Twylla Glendia BROCKS, MD;  Location: [facility];  Service: Urology;  Laterality: N/A;   URETEROSCOPY Right 03/29/2023   Procedure: DIAGNOSTIC URETEROSCOPY;  Surgeon: Twylla Glendia BROCKS, MD;  Location: [facility];  Service: Urology;  Laterality: Right;    SOCIAL HISTORY: Social History   Socioeconomic History   Marital status: Widowed    Spouse name: Not on file   Number of children: Not on file   Years of education: Not on file   Highest education level: Not on file  Occupational History   Not on file  Tobacco Use   Smoking status: Every Day    Current packs/day: 0.25    Types: Cigarettes   Smokeless tobacco: Never  Vaping Use   Vaping status: Never Used  Substance and Sexual Activity   Alcohol use: No   Drug use: No   Sexual  activity: Not on file  Other Topics Concern   Not on file  Social History Narrative   Not on file   Social Drivers of Health   Financial Resource Strain: Low Risk  (06/28/2022)   Overall Financial Resource Strain (CARDIA)    Difficulty of Paying Living Expenses: Not very hard  Food Insecurity: No Food Insecurity (12/22/2022)   Hunger Vital Sign    Worried About Running Out of Food in the Last Year: Never true    Ran Out of Food in the Last Year: Never true  Transportation Needs: No Transportation Needs (12/22/2022)   PRAPARE - Administrator, Civil Service (Medical): No    Lack of Transportation (Non-Medical): No  Physical Activity: Not on file  Stress: No Stress Concern Present (06/28/2022)   [employer] of Occupational Health - Occupational Stress Questionnaire    Feeling of Stress : Not at all  Social Connections: Not on file  Intimate Partner Violence: Not At Risk (12/22/2022)   Humiliation, Afraid, Rape, and Kick questionnaire    Fear of Current or Ex-Partner: No    Emotionally Abused: No    Physically Abused: No    Sexually  Abused: No    FAMILY HISTORY: Family History  Problem Relation Age of Onset   Asthma Mother    Heart disease Mother    Hypertension Mother    Diabetes Mother    Stroke Mother    Diabetes Sister    Diabetes Brother     ALLERGIES:  is allergic to ivp dye [iodinated contrast media] and shellfish allergy.  MEDICATIONS:  Current Outpatient Medications  Medication Sig Dispense Refill   albuterol  (VENTOLIN  HFA) 108 (90 Base) MCG/ACT inhaler Inhale 2 puffs into the lungs every 4 (four) hours as needed for wheezing or shortness of breath.     amLODipine  (NORVASC ) 10 MG tablet Take 10 mg by mouth every morning.     aspirin  EC 81 MG tablet Take 1 tablet (81 mg total) by mouth daily. Swallow whole. 30 tablet 12   cyanocobalamin  (VITAMIN B12) 1000 MCG tablet Take 1 tablet (1,000 mcg total) by mouth daily. 90 tablet 0   fluticasone -salmeterol  (ADVAIR) 250-50 MCG/ACT AEPB Inhale 1 puff into the lungs.     lidocaine -prilocaine  (EMLA ) cream Apply 1 Application topically as needed. Apply to port and cover with saran wrap 1-2 hours prior to port access 30 g 1   lisinopril  (ZESTRIL ) 40 MG tablet Take 40 mg by mouth every morning.     loratadine  (CLARITIN ) 10 MG tablet Take 10 mg by mouth daily.     meloxicam (MOBIC) 7.5 MG tablet Take 7.5-15 mg by mouth daily.     metFORMIN  (GLUCOPHAGE ) 1000 MG tablet Take 1,000 mg by mouth daily with breakfast.     OXYGEN Inhale 2 L into the lungs as directed.     Current Facility-Administered Medications  Medication Dose Route Frequency Provider Last Rate Last Admin   bcg vaccine injection 81 mg  3.24 mL Bladder Instillation Once        Facility-Administered Medications Ordered in Other Visits  Medication Dose Route Frequency Provider Last Rate Last Admin   pembrolizumab  (KEYTRUDA ) 200 mg in sodium chloride  0.9 % 50 mL chemo infusion  200 mg Intravenous Once Babara Call, MD 116 mL/hr at 03/14/24 1000 200 mg at 03/14/24 1000    Review of Systems  Constitutional:  Negative for appetite change, chills, fatigue and fever.  HENT:   Negative for hearing loss and voice change.   Eyes:  Negative for eye problems.  Respiratory:  Negative for chest tightness, cough and shortness of breath.   Cardiovascular:  Negative for chest pain.  Gastrointestinal:  Negative for abdominal distention, abdominal pain and blood in stool.  Endocrine: Negative for hot flashes.  Genitourinary:  Negative for difficulty urinating and frequency.   Musculoskeletal:  Negative for arthralgias.  Skin:  Negative for itching and rash.  Neurological:  Negative for extremity weakness.  Hematological:  Negative for adenopathy.  Psychiatric/Behavioral:  Negative for confusion.      PHYSICAL EXAMINATION: ECOG PERFORMANCE STATUS: 2 - Symptomatic, <50% confined to bed  Vitals:   03/14/24 0849 03/14/24 0851  BP: (!) 141/75 (!) 157/96   Pulse: 88   Resp: 16   Temp: 97.7 F (36.5 C)   SpO2: 100%     Filed Weights   03/14/24 0849  Weight: 115 lb 14.4 oz (52.6 kg)      Physical Exam Constitutional:      General: She is not in acute distress.    Appearance: She is not diaphoretic.  HENT:     Head: Normocephalic.  Eyes:     General: No scleral  icterus.    Pupils: Pupils are equal, round, and reactive to light.  Cardiovascular:     Rate and Rhythm: Tachycardia present.     Heart sounds: No murmur heard. Pulmonary:     Effort: Pulmonary effort is normal. No respiratory distress.     Comments: Decreased breath sounds bilaterally Abdominal:     General: There is no distension.  Musculoskeletal:        General: Normal range of motion.     Cervical back: Normal range of motion.  Skin:    Findings: No erythema.  Neurological:     Mental Status: She is alert and oriented to person, place, and time. Mental status is at baseline.     Cranial Nerves: No cranial nerve deficit.     Motor: No abnormal muscle tone.  Psychiatric:        Mood and Affect: Affect normal.      LABORATORY DATA:  I have reviewed the data as listed    Latest Ref Rng & Units 03/14/2024    8:37 AM 02/22/2024    8:39 AM 02/01/2024    9:06 AM  CBC  WBC 4.0 - 10.5 K/uL 7.4  6.5  7.2   Hemoglobin 12.0 - 15.0 g/dL 88.4  89.3  88.5   Hematocrit 36.0 - 46.0 % 35.9  33.1  35.1   Platelets 150 - 400 K/uL 241  282  326       Latest Ref Rng & Units 03/14/2024    8:37 AM 02/22/2024    8:39 AM 02/01/2024    9:06 AM  CMP  Glucose 70 - 99 mg/dL 865  875  99   BUN 8 - 23 mg/dL 22  20  18    Creatinine 0.44 - 1.00 mg/dL 8.81  9.04  9.06   Sodium 135 - 145 mmol/L 138  139  134   Potassium 3.5 - 5.1 mmol/L 3.7  3.3  3.5   Chloride 98 - 111 mmol/L 103  104  99   CO2 22 - 32 mmol/L 27  25  27    Calcium  8.9 - 10.3 mg/dL 9.2  9.3  9.4   Total Protein 6.5 - 8.1 g/dL 7.3  7.3  7.3   Total Bilirubin 0.0 - 1.2 mg/dL 0.5  0.5  0.5   Alkaline Phos 38  - 126 U/L 69  65  67   AST 15 - 41 U/L 25  26  27    ALT 0 - 44 U/L 16  16  15       RADIOGRAPHIC STUDIES: I have personally reviewed the radiological images as listed and agreed with the findings in the report. CT CHEST ABDOMEN PELVIS WO CONTRAST Result Date: 02/18/2024 CLINICAL DATA:  Lung cancer restaging * Tracking Code: BO * EXAM: CT CHEST, ABDOMEN AND PELVIS WITHOUT CONTRAST TECHNIQUE: Multidetector CT imaging of the chest, abdomen and pelvis was performed following the standard protocol without IV contrast. Oral enteric contrast was administered. RADIATION DOSE REDUCTION: This exam was performed according to the departmental dose-optimization program which includes automated exposure control, adjustment of the mA and/or kV according to patient size and/or use of iterative reconstruction technique. COMPARISON:  09/21/2023 FINDINGS: CT CHEST FINDINGS Cardiovascular: Right chest port catheter. Aortic atherosclerosis. Normal heart size. Left and right coronary artery calcifications. No pericardial effusion. Mediastinum/Nodes: No significant change in matted soft tissue or treated lymph nodes in the prevascular station measuring 2.7 x 1.2 cm (series 2, image 22). No discretely enlarged mediastinal, hilar, or  axillary lymph nodes. Thyroid  gland, trachea, and esophagus demonstrate no significant findings. Lungs/Pleura: Mild centrilobular emphysema. Unchanged bilateral irregular pulmonary nodules, for example in the peripheral left upper lobe measuring 0.8 x 0.7 cm (series 4, image 41) and multiple ground-glass opacities, for example in the peripheral posterior right upper lobe measuring 1.0 x 0.9 cm (series 4, image 50). Unchanged consolidative opacity of the paramedian left upper lobe measuring 1.7 x 1.1 cm (series 4, image 52). No pleural effusion or pneumothorax. Musculoskeletal: No chest wall abnormality. No acute osseous findings. CT ABDOMEN PELVIS FINDINGS Hepatobiliary: No solid liver abnormality is  seen. Unchanged liver cysts. Gallstones. No gallbladder wall thickening, or biliary dilatation. Pancreas: Unremarkable. No pancreatic ductal dilatation or surrounding inflammatory changes. Spleen: Normal in size without significant abnormality. Adrenals/Urinary Tract: Adrenal glands are unremarkable. Right-sided double-J ureteral stent catheter with formed pigtails in the right renal pelvis and urinary bladder. Unchanged mild right hydronephrosis. Small nonobstructive left calculus. No left-sided hydronephrosis. Endoluminal mass of the right aspect of the urinary bladder, better seen on today's examination and appears to have enlarged at 3.1 x 1.7 cm, difficult to confidently measure on prior although approximately 1.9 x 1.2 cm (series 2, image 103). Stomach/Bowel: Stomach is within normal limits. Appendix appears normal. No evidence of bowel wall thickening, distention, or inflammatory changes. Vascular/Lymphatic: Aortic atherosclerosis. No enlarged abdominal or pelvic lymph nodes. Reproductive: Hysterectomy. Other: No abdominal wall hernia or abnormality. No ascites. Musculoskeletal: No acute osseous findings. IMPRESSION: 1. Unchanged bilateral pulmonary nodules and consolidative opacity of the paramedian left upper lobe. 2. No significant change in matted soft tissue or treated lymph nodes in the prevascular station. No discretely enlarged mediastinal, hilar, or axillary lymph nodes. 3. Endoluminal mass of the right aspect of the urinary bladder, better seen on today's examination and appears to have enlarged at 3.1 x 1.7 cm, difficult to confidently measure on prior although approximately 1.9 x 1.2 cm. This is most consistent with a primary bladder malignancy. 4. Right-sided double-J ureteral stent catheter with formed pigtails in the right renal pelvis and urinary bladder. Unchanged mild right hydronephrosis. 5. Cholelithiasis. 6. Coronary artery disease. Aortic Atherosclerosis (ICD10-I70.0) and Emphysema  (ICD10-J43.9). Electronically Signed   By: Marolyn JONETTA Jaksch M.D.   On: 02/18/2024 08:23

## 2024-03-14 NOTE — Assessment & Plan Note (Signed)
 Continue nutrition supplement.

## 2024-03-14 NOTE — Assessment & Plan Note (Signed)
 Immunotherapy treatment plan as listed above.

## 2024-03-15 LAB — MICROSCOPIC EXAMINATION: WBC, UA: >30 /HPF — AB (ref 0–5)

## 2024-03-15 LAB — URINALYSIS, COMPLETE
Bilirubin, UA: NEGATIVE
Glucose, UA: NEGATIVE
Ketones, UA: NEGATIVE
Nitrite, UA: POSITIVE — AB
Specific Gravity, UA: 1.02 (ref 1.005–1.030)
Urobilinogen, Ur: 0.2 mg/dL (ref 0.2–1.0)
pH, UA: 6 (ref 5.0–7.5)

## 2024-03-15 LAB — T4: T4, Total: 9.7 ug/dL (ref 4.5–12.0)

## 2024-03-17 LAB — CULTURE, URINE COMPREHENSIVE

## 2024-03-19 ENCOUNTER — Encounter
Admission: RE | Admit: 2024-03-19 | Discharge: 2024-03-19 | Disposition: A | Discharge status: Home / Self Care | Source: Ambulatory Visit | Attending: Urology | Admitting: Urology

## 2024-03-19 ENCOUNTER — Other Ambulatory Visit: Payer: Self-pay

## 2024-03-19 VITALS — Ht 63.0 in | Wt 115.0 lb

## 2024-03-19 DIAGNOSIS — I10 Essential (primary) hypertension: Secondary | ICD-10-CM

## 2024-03-19 DIAGNOSIS — E1151 Type 2 diabetes mellitus with diabetic peripheral angiopathy without gangrene: Secondary | ICD-10-CM

## 2024-03-19 NOTE — Patient Instructions (Signed)
 Your procedure is scheduled on: Thursday 03/29/24 Report to the Registration Desk on the 1st floor of the [facility]. To find out your arrival time, please call [phone] between 1PM - 3PM on: Wednesday 03/28/24 If your arrival time is 6:00 am, do not arrive before that time as the [facility] entrance doors do not open until 6:00 am.  REMEMBER: Instructions that are not followed completely may result in serious medical risk, up to and including death; or upon the discretion of your surgeon and anesthesiologist your surgery may need to be rescheduled.  Do not eat food or drink any liquids after midnight the night before surgery.  No gum chewing or hard candies.  One week prior to surgery: Stop Anti-inflammatories (NSAIDS) such as Advil , Aleve, Ibuprofen , Motrin , Naproxen, Naprosyn and Aspirin  based products such as Excedrin, Goody's Powder, BC Powder. Stop your Meloxicam Today 03/19/24  You may however, continue to take Tylenol  if needed for pain up until the day of surgery.  Stop ANY OVER THE COUNTER supplements and vitamins until after surgery.  **Follow recommendations regarding stopping blood thinners.** Last dose of Aspirin  will be Sunday 03/25/24  Continue taking all of your other prescription medications up until the day of surgery.  ON THE DAY OF SURGERY ONLY TAKE THESE MEDICATIONS WITH SIPS OF WATER:  amLODipine  (NORVASC ) 10 MG tablet  loratadine  (CLARITIN ) 10 MG tablet   Use inhalers on the day of surgery and bring to the hospital.  Fleets enema or bowel prep as directed.  No Alcohol for 24 hours before or after surgery.  No Smoking including e-cigarettes for 24 hours before surgery.  No chewable tobacco products for at least 6 hours before surgery.  No nicotine  patches on the day of surgery.  Do not use any recreational drugs for at least a week (preferably 2 weeks) before your surgery.  Please be advised that the combination of cocaine and anesthesia may have  negative outcomes, up to and including death. If you test positive for cocaine, your surgery will be cancelled.  On the morning of surgery brush your teeth with toothpaste and water, you may rinse your mouth with mouthwash if you wish. Do not swallow any toothpaste or mouthwash.  Shower/Bathe prior to your arrival for your procedure  Do not wear lotions, powders, or perfumes.   Do not shave body hair from the neck down 48 hours before surgery.  Wear comfortable clothing (specific to your surgery type) to the hospital.  Do not wear jewelry, make-up, hairpins, clips or nail polish.  For welded (permanent) jewelry: bracelets, anklets, waist bands, etc.  Please have this removed prior to surgery.  If it is not removed, there is a chance that hospital personnel will need to cut it off on the day of surgery.  Contact lenses, hearing aids and dentures may not be worn into surgery.  Do not bring valuables to the hospital. [facility] is not responsible for any missing/lost belongings or valuables.   Notify your doctor if there is any change in your medical condition (cold, fever, infection).  After surgery, you can help prevent lung complications by doing breathing exercises.  Take deep breaths and cough every 1-2 hours. Your doctor may order a device called an Incentive Spirometer to help you take deep breaths.  If you are being discharged the day of surgery, you will not be allowed to drive home. You will need a responsible individual to drive you home and stay with you for 24 hours  after surgery.   Please call the Pre-admissions Testing Dept. at [phone] if you have any questions about these instructions.  Surgery Visitation Policy:  Patients having surgery or a procedure may have two visitors.  Children under the age of 85 must have an adult with them who is not the patient.  Merchandiser, Retail to address health-related social needs:   [email or web address]

## 2024-03-21 ENCOUNTER — Inpatient Hospital Stay: Admission: RE | Admit: 2024-03-21 | Source: Ambulatory Visit

## 2024-03-26 ENCOUNTER — Encounter
Admission: RE | Admit: 2024-03-26 | Discharge: 2024-03-26 | Disposition: A | Source: Ambulatory Visit | Attending: Urology | Admitting: Urology

## 2024-03-26 DIAGNOSIS — Z01810 Encounter for preprocedural cardiovascular examination: Secondary | ICD-10-CM | POA: Diagnosis present

## 2024-03-26 DIAGNOSIS — I10 Essential (primary) hypertension: Secondary | ICD-10-CM | POA: Insufficient documentation

## 2024-03-28 MED ORDER — ORAL CARE MOUTH RINSE: 15.0000 mL | Freq: Once | OROMUCOSAL | Status: AC

## 2024-03-28 MED ORDER — GEMCITABINE CHEMO FOR BLADDER INSTILLATION 2000 MG
2000.0000 mg | Freq: Once | INTRAVENOUS | Status: DC
  Filled 2024-03-28: qty 52.6

## 2024-03-28 MED ORDER — CEFAZOLIN SODIUM-DEXTROSE 2-4 GM/100ML-% IV SOLN
2.0000 g | INTRAVENOUS | Status: AC
  Administered 2024-03-29: 2 g via INTRAVENOUS

## 2024-03-28 MED ORDER — CHLORHEXIDINE GLUCONATE 0.12 % MT SOLN
15.0000 mL | Freq: Once | OROMUCOSAL | Status: AC
  Administered 2024-03-29: 15 mL via OROMUCOSAL

## 2024-03-28 MED ORDER — LACTATED RINGERS IV SOLN: INTRAVENOUS | Status: DC

## 2024-03-29 ENCOUNTER — Encounter: Payer: Self-pay | Admitting: Urology

## 2024-03-29 ENCOUNTER — Ambulatory Visit: Admitting: Certified Registered"

## 2024-03-29 ENCOUNTER — Encounter
Admission: RE | Disposition: A | Discharge status: Home / Self Care | Payer: Self-pay | Source: Home / Self Care | Attending: Urology

## 2024-03-29 ENCOUNTER — Other Ambulatory Visit: Payer: Self-pay

## 2024-03-29 ENCOUNTER — Ambulatory Visit: Admission: RE | Admit: 2024-03-29 | Discharge: 2024-03-29 | Disposition: A | Attending: Urology | Admitting: Urology

## 2024-03-29 ENCOUNTER — Ambulatory Visit

## 2024-03-29 DIAGNOSIS — C679 Malignant neoplasm of bladder, unspecified: Secondary | ICD-10-CM | POA: Diagnosis not present

## 2024-03-29 DIAGNOSIS — C661 Malignant neoplasm of right ureter: Secondary | ICD-10-CM

## 2024-03-29 DIAGNOSIS — E1151 Type 2 diabetes mellitus with diabetic peripheral angiopathy without gangrene: Secondary | ICD-10-CM

## 2024-03-29 DIAGNOSIS — N3289 Other specified disorders of bladder: Secondary | ICD-10-CM

## 2024-03-29 HISTORY — PX: CYSTOSCOPY W/ URETERAL STENT REMOVAL: SHX1430

## 2024-03-29 HISTORY — PX: CYSTOSCOPY W/ URETERAL STENT PLACEMENT: SHX1429

## 2024-03-29 HISTORY — PX: TRANSURETHRAL RESECTION OF BLADDER TUMOR: SHX2575

## 2024-03-29 HISTORY — PX: URETERAL BIOPSY: SHX6688

## 2024-03-29 HISTORY — PX: URETEROSCOPY: SHX842

## 2024-03-29 LAB — GLUCOSE, CAPILLARY: Glucose-Capillary: 141 mg/dL — ABNORMAL HIGH (ref 70–99)

## 2024-03-29 SURGERY — TURBT (TRANSURETHRAL RESECTION OF BLADDER TUMOR)
Anesthesia: General | Laterality: Right

## 2024-03-29 MED ORDER — FENTANYL CITRATE (PF) 100 MCG/2ML IJ SOLN: 25.0000 ug | INTRAMUSCULAR | Status: DC | PRN

## 2024-03-29 MED ORDER — SODIUM CHLORIDE 0.9 % IR SOLN
Status: DC | PRN
  Administered 2024-03-29 (×2): 6000 mL
  Administered 2024-03-29: 3000 mL

## 2024-03-29 MED ORDER — DEXAMETHASONE SOD PHOSPHATE PF 10 MG/ML IJ SOLN
INTRAMUSCULAR | Status: DC | PRN
  Administered 2024-03-29: 4 mg via INTRAVENOUS

## 2024-03-29 MED ORDER — SUGAMMADEX SODIUM 200 MG/2ML IV SOLN
INTRAVENOUS | Status: DC | PRN
  Administered 2024-03-29: 200 mg via INTRAVENOUS

## 2024-03-29 MED ORDER — PHENYLEPHRINE HCL-NACL 20-0.9 MG/250ML-% IV SOLN
INTRAVENOUS | Status: AC
  Filled 2024-03-29: qty 250

## 2024-03-29 MED ORDER — OXYCODONE HCL 5 MG PO TABS: 5.0000 mg | ORAL_TABLET | Freq: Once | ORAL | Status: DC | PRN

## 2024-03-29 MED ORDER — DROPERIDOL 2.5 MG/ML IJ SOLN: 0.6250 mg | Freq: Once | INTRAMUSCULAR | Status: DC | PRN

## 2024-03-29 MED ORDER — ESMOLOL HCL 100 MG/10ML IV SOLN
INTRAVENOUS | Status: DC | PRN
  Administered 2024-03-29 (×7): 20 mg via INTRAVENOUS

## 2024-03-29 MED ORDER — OXYCODONE HCL 5 MG/5ML PO SOLN: 5.0000 mg | Freq: Once | ORAL | Status: DC | PRN

## 2024-03-29 MED ORDER — PROPOFOL 10 MG/ML IV BOLUS
INTRAVENOUS | Status: DC | PRN
  Administered 2024-03-29: 40 mg via INTRAVENOUS
  Administered 2024-03-29 (×2): 20 mg via INTRAVENOUS
  Administered 2024-03-29: 100 mg via INTRAVENOUS
  Administered 2024-03-29: 20 mg via INTRAVENOUS

## 2024-03-29 MED ORDER — HYDRALAZINE HCL 20 MG/ML IJ SOLN
10.0000 mg | Freq: Once | INTRAMUSCULAR | Status: AC
  Administered 2024-03-29: 10 mg via INTRAVENOUS
  Filled 2024-03-29: qty 0.5

## 2024-03-29 MED ORDER — IOHEXOL 180 MG/ML  SOLN
INTRAMUSCULAR | Status: DC | PRN
  Administered 2024-03-29: 10 mL

## 2024-03-29 MED ORDER — CEFAZOLIN SODIUM-DEXTROSE 2-4 GM/100ML-% IV SOLN
INTRAVENOUS | Status: AC
  Filled 2024-03-29: qty 100

## 2024-03-29 MED ORDER — ONDANSETRON HCL 4 MG/2ML IJ SOLN
INTRAMUSCULAR | Status: DC | PRN
  Administered 2024-03-29: 4 mg via INTRAVENOUS

## 2024-03-29 MED ORDER — FENTANYL CITRATE (PF) 100 MCG/2ML IJ SOLN
INTRAMUSCULAR | Status: AC
  Filled 2024-03-29: qty 2

## 2024-03-29 MED ORDER — ACETAMINOPHEN 10 MG/ML IV SOLN: 1000.0000 mg | Freq: Once | INTRAVENOUS | Status: DC | PRN

## 2024-03-29 MED ORDER — FENTANYL CITRATE (PF) 100 MCG/2ML IJ SOLN
INTRAMUSCULAR | Status: DC | PRN
  Administered 2024-03-29: 50 ug via INTRAVENOUS
  Administered 2024-03-29 (×2): 25 ug via INTRAVENOUS

## 2024-03-29 MED ORDER — PHENYLEPHRINE HCL-NACL 20-0.9 MG/250ML-% IV SOLN
INTRAVENOUS | Status: DC | PRN
  Administered 2024-03-29: 30 ug/min via INTRAVENOUS

## 2024-03-29 MED ORDER — PHENYLEPHRINE 80 MCG/ML (10ML) SYRINGE FOR IV PUSH (FOR BLOOD PRESSURE SUPPORT)
PREFILLED_SYRINGE | INTRAVENOUS | Status: DC | PRN
  Administered 2024-03-29 (×4): 80 ug via INTRAVENOUS

## 2024-03-29 MED ORDER — METOPROLOL TARTRATE 5 MG/5ML IV SOLN
INTRAVENOUS | Status: DC | PRN
  Administered 2024-03-29 (×2): 1 mg via INTRAVENOUS
  Administered 2024-03-29: 2 mg via INTRAVENOUS

## 2024-03-29 MED ORDER — LIDOCAINE HCL (CARDIAC) PF 100 MG/5ML IV SOSY
PREFILLED_SYRINGE | INTRAVENOUS | Status: DC | PRN
  Administered 2024-03-29: 40 mg via INTRAVENOUS

## 2024-03-29 MED ORDER — CHLORHEXIDINE GLUCONATE 0.12 % MT SOLN
OROMUCOSAL | Status: AC
  Filled 2024-03-29: qty 15

## 2024-03-29 MED ORDER — HYDRALAZINE HCL 20 MG/ML IJ SOLN
INTRAMUSCULAR | Status: AC
  Filled 2024-03-29: qty 1

## 2024-03-29 SURGICAL SUPPLY — 32 items
BAG DRAIN SIEMENS DORNER NS (MISCELLANEOUS) ×2 IMPLANT
BAG URINE DRAIN 2000ML AR STRL (UROLOGICAL SUPPLIES) ×2 IMPLANT
BRUSH SCRUB EZ 1% IODOPHOR (MISCELLANEOUS) ×2 IMPLANT
BRUSH SCRUB EZ 4% CHG (MISCELLANEOUS) ×2 IMPLANT
CATH FOL 2WAY LX 18X30 (CATHETERS) ×2 IMPLANT
CATH URET FLEX-TIP 2 LUMEN 10F (CATHETERS) ×2 IMPLANT
CATH URETL OPEN 5X70 (CATHETERS) ×2 IMPLANT
CNTNR URN SCR LID CUP LEK RST (MISCELLANEOUS) IMPLANT
DRAPE UTILITY 15X26 TOWEL STRL (DRAPES) ×2 IMPLANT
DRSG TELFA 3X4 N-ADH STERILE (GAUZE/BANDAGES/DRESSINGS) ×2 IMPLANT
ELECTRD BIVAP BIPO 22/24 DONUT (ELECTROSURGICAL) IMPLANT
ELECTRODE LOOP 22F BIPOLAR SML (ELECTROSURGICAL) IMPLANT
ELECTRODE REM PT RTRN 9FT ADLT (ELECTROSURGICAL) IMPLANT
FORCEPS BIOP PIRANHA Y (CUTTING FORCEPS) IMPLANT
GLOVE BIOGEL PI IND STRL 7.5 (GLOVE) ×2 IMPLANT
GOWN STRL REUS W/ TWL LRG LVL3 (GOWN DISPOSABLE) ×4 IMPLANT
GOWN STRL REUS W/ TWL XL LVL3 (GOWN DISPOSABLE) ×2 IMPLANT
GOWN STRL REUS W/TWL XL LVL4 (GOWN DISPOSABLE) ×2 IMPLANT
GUIDEWIRE GREEN .038 145CM (MISCELLANEOUS) ×2 IMPLANT
GUIDEWIRE STR DUAL SENSOR (WIRE) ×2 IMPLANT
KIT TURNOVER CYSTO (KITS) ×2 IMPLANT
LOOP CUT BIPOLAR 24F LRG (ELECTROSURGICAL) IMPLANT
NDL SAFETY ECLIP 18X1.5 (MISCELLANEOUS) ×2 IMPLANT
PACK CYSTO AR (MISCELLANEOUS) ×2 IMPLANT
SET CYSTO IRRIGATION (SET/KITS/TRAYS/PACK) ×2 IMPLANT
SET IRRIG Y-TYPE CYSTO (SET/KITS/TRAYS/PACK) ×2 IMPLANT
SHEATH NAVIGATOR HD 12/14X36 (SHEATH) IMPLANT
SOL .9 NS 3000ML IRR UROMATIC (IV SOLUTION) ×4 IMPLANT
SOLN STERILE WATER 500 ML (IV SOLUTION) ×2 IMPLANT
SURGILUBE 2OZ TUBE FLIPTOP (MISCELLANEOUS) ×2 IMPLANT
SYRINGE TOOMEY IRRIG 70ML (MISCELLANEOUS) ×2 IMPLANT
TUBING THERMACLEAR UROLOGY (TUBING) ×2 IMPLANT

## 2024-03-29 NOTE — Anesthesia Preprocedure Evaluation (Signed)
 Anesthesia Evaluation  Patient identified by MRN, date of birth, ID band Patient awake    Reviewed: Allergy & Precautions, H&P , NPO status , Patient's Chart, lab work & pertinent test results, reviewed documented beta blocker date and time   Airway Mallampati: II  TM Distance: >3 FB Neck ROM: full    Dental  (+) Teeth Intact   Pulmonary shortness of breath, COPD, Current Smoker and Patient abstained from smoking.   Pulmonary exam normal        Cardiovascular Exercise Tolerance: Poor hypertension, On Medications + Peripheral Vascular Disease  Normal cardiovascular exam Rhythm:regular Rate:Normal     Neuro/Psych CVA  negative psych ROS   GI/Hepatic Neg liver ROS,GERD  Medicated,,  Endo/Other  negative endocrine ROSdiabetes    Renal/GU Renal disease  negative genitourinary   Musculoskeletal   Abdominal   Peds  Hematology  (+) Blood dyscrasia, anemia   Anesthesia Other Findings Past Medical History: No date: Atherosclerosis of native arteries of extremity with  intermittent claudication No date: Bladder mass No date: Chronic kidney disease, stage 3a (HCC) No date: COPD (chronic obstructive pulmonary disease) (HCC) No date: Diabetes mellitus without complication (HCC) No date: Gastrointestinal hemorrhage No date: GERD (gastroesophageal reflux disease) No date: Glaucoma No date: History of duodenal ulcer No date: HTN (hypertension) No date: Hypertension No date: Hypokalemia No date: Hypomagnesemia No date: Leukocytosis No date: Lung mass 06/28/2022: Malignant neoplasm of lung (HCC)     Comment:  a.) stage IVA (cT4, cN3, cM1a) favor squamous cell               carcinoma --> TPS 90%, TMB 10, KRAS G12V No date: Malnutrition No date: Normocytic anemia 05/2022: Pericardial effusion No date: Pulmonary embolism (HCC) No date: Sepsis (HCC) No date: Tobacco abuse No date: Type II diabetes mellitus with renal  manifestations (HCC) 12/21/2022: Urothelial carcinoma of bladder (HCC)     Comment:  a.) stage I (cT1, cN0, cM0) Past Surgical History: No date: ABDOMINAL HYSTERECTOMY No date: BACK SURGERY     Comment:  1988 and 1998 12/21/2022: CYSTOSCOPY W/ RETROGRADES; Bilateral     Comment:  Procedure: CYSTOSCOPY WITH RETROGRADE PYELOGRAM;                Surgeon: Twylla Glendia BROCKS, MD;  Location: [facility];                Service: Urology;  Laterality: Bilateral; 03/29/2023: CYSTOSCOPY WITH BIOPSY; N/A     Comment:  Procedure: CYSTOSCOPY WITH BLADDER BIOPSY;  Surgeon:               Twylla Glendia BROCKS, MD;  Location: [facility];  Service:               Urology;  Laterality: N/A; 03/29/2023: CYSTOSCOPY WITH STENT PLACEMENT; Right     Comment:  Procedure: CYSTOSCOPY WITH STENT PLACEMENT;  Surgeon:               Twylla Glendia BROCKS, MD;  Location: [facility];  Service:               Urology;  Laterality: Right; 07/29/2022: ESOPHAGOGASTRODUODENOSCOPY (EGD) WITH PROPOFOL ; N/A     Comment:  Procedure: ESOPHAGOGASTRODUODENOSCOPY (EGD) WITH               PROPOFOL ;  Surgeon: Therisa Bi, MD;  Location: [facility]               ENDOSCOPY;  Service: Gastroenterology;  Laterality: N/A; 01/21/2023: ESOPHAGOGASTRODUODENOSCOPY (EGD) WITH  PROPOFOL ; N/A     Comment:  Procedure: ESOPHAGOGASTRODUODENOSCOPY (EGD) WITH               PROPOFOL ;  Surgeon: Therisa Bi, MD;  Location: [facility]               ENDOSCOPY;  Service: Gastroenterology;  Laterality: N/A; 03/29/2023: HOLMIUM LASER APPLICATION; Right     Comment:  Procedure: HOLMIUM LASER ABLATION;  Surgeon: Twylla Glendia BROCKS, MD;  Location: [facility];  Service: Urology;                Laterality: Right; 01/10/2023: IR IMAGING GUIDED PORT INSERTION 12/21/2022: TRANSURETHRAL RESECTION OF BLADDER TUMOR; N/A     Comment:  Procedure: TRANSURETHRAL RESECTION OF BLADDER TUMOR               (TURBT);  Surgeon: Twylla Glendia BROCKS, MD;  Location: [facility]               ORS;  Service: Urology;   Laterality: N/A; 03/29/2023: TRANSURETHRAL RESECTION OF BLADDER TUMOR; N/A     Comment:  Procedure: TRANSURETHRAL RESECTION OF BLADDER TUMOR               (TURBT);  Surgeon: Twylla Glendia BROCKS, MD;  Location: [facility]               ORS;  Service: Urology;  Laterality: N/A; 03/29/2023: URETEROSCOPY; Right     Comment:  Procedure: DIAGNOSTIC URETEROSCOPY;  Surgeon: Twylla Glendia BROCKS, MD;  Location: [facility];  Service: Urology;                Laterality: Right;   Reproductive/Obstetrics negative OB ROS                              Anesthesia Physical Anesthesia Plan  ASA: 3  Anesthesia Plan: General ETT   Post-op Pain Management:    Induction:   PONV Risk Score and Plan: 3  Airway Management Planned:   Additional Equipment:   Intra-op Plan:   Post-operative Plan:   Informed Consent: I have reviewed the patients History and Physical, chart, labs and discussed the procedure including the risks, benefits and alternatives for the proposed anesthesia with the patient or authorized representative who has indicated his/her understanding and acceptance.     Dental Advisory Given  Plan Discussed with: CRNA  Anesthesia Plan Comments:         Anesthesia Quick Evaluation

## 2024-03-29 NOTE — Transfer of Care (Signed)
 Immediate Anesthesia Transfer of Care Note  Patient: Regina Perkins  Procedure(s) Performed: TURBT (TRANSURETHRAL RESECTION OF BLADDER TUMOR) CYSTOSCOPY, FLEXIBLE, WITH STENT REPLACEMENT (Right) REMOVAL, STENT, URETER, CYSTOSCOPIC (Right) URETEROSCOPY (Right) BIOPSY, URETER (Right)  Patient Location: PACU  Anesthesia Type:General  Level of Consciousness: drowsy  Airway & Oxygen Therapy: Patient Spontanous Breathing and Patient connected to face mask oxygen  Post-op Assessment: Report given to RN  Post vital signs: stable  Last Vitals:  Vitals Value Taken Time  BP 205/86 03/29/24 11:31  Temp    Pulse 54 03/29/24 11:34  Resp 15 03/29/24 11:34  SpO2 100 % 03/29/24 11:34  Vitals shown include unfiled device data.  Last Pain:  Vitals:   03/29/24 0759  TempSrc: Temporal  PainSc: 0-No pain         Complications: No notable events documented.

## 2024-03-29 NOTE — Anesthesia Procedure Notes (Signed)
 Procedure Name: Intubation Date/Time: 03/29/2024 9:53 AM  Performed by: Rosine Shona Jansky, CRNAPre-anesthesia Checklist: Patient identified, Emergency Drugs available, Suction available and Patient being monitored Patient Re-evaluated:Patient Re-evaluated prior to induction Oxygen Delivery Method: Circle system utilized Preoxygenation: Pre-oxygenation with 100% oxygen Induction Type: IV induction Ventilation: Mask ventilation without difficulty Laryngoscope Size: McGrath and 3 Grade View: Grade I Tube type: Oral Tube size: 7.0 mm Number of attempts: 1 Airway Equipment and Method: Stylet and Oral airway Placement Confirmation: ETT inserted through vocal cords under direct vision, positive ETCO2 and breath sounds checked- equal and bilateral Secured at: 21 cm Tube secured with: Tape Dental Injury: Teeth and Oropharynx as per pre-operative assessment

## 2024-03-29 NOTE — Interval H&P Note (Signed)
 History and Physical Interval Note:  03/29/2024 9:33 AM  Regina Perkins  has presented today for surgery, with the diagnosis of Bladder Mass, Bladder Cancer, Right Ureteral Carcinoma.  The various methods of treatment have been discussed with the patient and family. After consideration of risks, benefits and other options for treatment, the patient has consented to  Procedure(s) with comments: TURBT (TRANSURETHRAL RESECTION OF BLADDER TUMOR) (N/A) CYSTOSCOPY, FLEXIBLE, WITH STENT REPLACEMENT (Right) REMOVAL, STENT, URETER, CYSTOSCOPIC (Right) URETEROSCOPY (Right) BIOPSY, URETER (Right) - /Fulguration INSTILLATION, BLADDER (N/A) - Gemcitabine as a surgical intervention.  The patient's history has been reviewed, patient examined, no change in status, stable for surgery.  I have reviewed the patient's chart and labs.  Questions were answered to the patient's satisfaction.    CV:RRR Lungs:clear   Glendia JAYSON Barba

## 2024-03-29 NOTE — Anesthesia Postprocedure Evaluation (Signed)
 Anesthesia Post Note  Patient: Regina Perkins  Procedure(s) Performed: TURBT (TRANSURETHRAL RESECTION OF BLADDER TUMOR) CYSTOSCOPY, FLEXIBLE, WITH STENT REPLACEMENT (Right) REMOVAL, STENT, URETER, CYSTOSCOPIC (Right) URETEROSCOPY (Right) BIOPSY, URETER (Right)  Patient location during evaluation: PACU Anesthesia Type: General Level of consciousness: awake and alert Pain management: pain level controlled Vital Signs Assessment: post-procedure vital signs reviewed and stable Respiratory status: spontaneous breathing, nonlabored ventilation, respiratory function stable and patient connected to nasal cannula oxygen Cardiovascular status: blood pressure returned to baseline and stable Postop Assessment: no apparent nausea or vomiting Anesthetic complications: no   No notable events documented.   Last Vitals:  Vitals:   03/29/24 1215 03/29/24 1252  BP: (!) 146/69 (!) 184/72  Pulse: 62 81  Resp: 17 17  Temp: (!) 36.1 C (!) 36.1 C  SpO2: 95% 98%    Last Pain:  Vitals:   03/29/24 1200  TempSrc:   PainSc: 0-No pain                 Debby Mines

## 2024-03-29 NOTE — Discharge Instructions (Signed)
 Transurethral Resection of Bladder Tumor (TURBT) or Bladder Biopsy   Definition:  Transurethral Resection of the Bladder Tumor is a surgical procedure used to diagnose and remove tumors within the bladder.   General instructions:     Your recent bladder surgery requires very little post hospital care but some definite precautions.  Despite the fact that no skin incisions were used, the area around the bladder incisions are raw and covered with scabs to promote healing and prevent bleeding. Certain precautions are needed to insure that the scabs are not disturbed over the next 2-4 weeks while the healing proceeds.  Because the raw surface inside your bladder and the irritating effects of urine you may expect frequency of urination and/or urgency (a stronger desire to urinate) and perhaps even getting up at night more often. This will usually resolve or improve slowly over the healing period. You may see some blood in your urine over the first 6 weeks. Do not be alarmed, even if the urine was clear for a while. Get off your feet and drink lots of fluids until clearing occurs. If you start to pass clots or don't improve call us.  Diet:  You may return to your normal diet immediately. Because of the raw surface of your bladder, alcohol, spicy foods, foods high in acid and drinks with caffeine may cause irritation or frequency and should be used in moderation. To keep your urine flowing freely and avoid constipation, drink plenty of fluids during the day (8-10 glasses). Tip: Avoid cranberry juice because it is very acidic.  Activity:  Your physical activity doesn't need to be restricted. However, if you are very active, you may see some blood in the urine. We suggest that you reduce your activity under the circumstances until the bleeding has stopped.  Bowels:  It is important to keep your bowels regular during the postoperative period. Straining with bowel movements can cause bleeding. A bowel  movement every other day is reasonable. Use a mild laxative if needed, such as milk of magnesia 2-3 tablespoons, or 2 Dulcolax tablets. Call if you continue to have problems. If you had been taking narcotics for pain, before, during or after your surgery, you may be constipated. Take a laxative if necessary.    Medication:  You should resume your pre-surgery medications unless told not to. In addition you may be given an antibiotic to prevent or treat infection. Antibiotics are not always necessary. All medication should be taken as prescribed until the bottles are finished unless you are having an unusual reaction to one of the drugs.   [facility] [address] [place], [place] [address] [phone]

## 2024-03-29 NOTE — Op Note (Signed)
 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 Airen Dales, M.D.  Anesthesia: General  Complications: None  Intraoperative findings:  Cystoscopy: Papillary sessile right and infero-posterior wall tumor measuring 3 cm 5 cm papillary tumor bladder dome Right ureteroscopy: Tumor noted distal ureter in the superior portion Retrograde pyelography: Renal collecting system normal in appearance without filling defect  EBL: Minimal  Specimens: Posterior wall tumor Base of posterior wall tumor Biopsy posterior wall erythema Tumor bladder dome Biopsy right ureteral tumor      Indication: Regina Perkins is a 83 y.o. female with a history of T1 urothelial carcinoma the bladder who was lost to follow-up after 5 cycles induction BCG.  Refer to the admission H&P for details.  After reviewing the management options for treatment, he elected to proceed with the above surgical procedure(s). We have discussed the potential benefits and risks of the procedure, side effects of the proposed treatment, the likelihood of the patient achieving the goals of the procedure, and any potential problems that might occur during the procedure or recuperation. Informed consent has been obtained.  Description of procedure:  The patient was taken to the operating room and general anesthesia was induced.  The patient was placed in the dorsal lithotomy position, prepped and draped in the usual sterile fashion, and preoperative antibiotics were administered. A preoperative time-out was performed.   A 21 French cystoscope sheath with obturator was lubricated and placed per urethra.  Panendoscopy was  then performed with a 30 degree lens with findings as described above.  A 0.038 Sensor wire was placed through the cystoscope and into the right ureter alongside the stent and advanced into the renal pelvis under fluoroscopic guidance without difficulty.  The cystoscope was removed then repassed.  The stent was grasped with endoscopic forceps and removed.  A 18F continuous-flow resectoscope sheath with obturator was then lubricated and placed per urethra.  An Iglesias resectoscope with loop was then placed into the sheath.  The posterior wall tumor was resected down to superficial muscle.  Hemostasis was obtained with cautery.  Biopsies were then performed with rigid biopsy forceps of the base of bladder tumor and bladder mucosal erythema surrounding this tumor.  The resectoscope was then replaced and the biopsy sites and surrounding erythema were then fulgurated.  Due to the size and location of the dome tumor it was felt complete resection could not safely be performed without risk of bladder perforation.  The tumor was debulked with a combination of initial resection then vaporization with a button electrode.  All tumor was removed via irrigation.  Hemostasis was noted be adequate at all resection and biopsy sites.  The resectoscope was removed and a 4.5 F semirigid ureteroscope was placed per urethra and advanced into the ureter alongside the guidewire.  Tumor was noted in the upper portion of the distal ureter.  The ureter above this area to the UPJ was normal in appearance.  Biopsies of the ureteral tumor were performed with Piranha forceps.  Hemostasis was noted to be adequate.  The ureteroscope was removed and a 4.8 F/20 cm ureteral stent was then placed under fluoroscopic guidance with good curl was noted in the renal pelvis and bladder.  The resectoscope was replaced to ensure adequate hemostasis which was noted.  An 42F Foley catheter was placed.  The balloon was inflated with 10 mL of  sterile water.  The catheter was irrigated with return of clear effluent.  After anesthetic reversal she was transported to the PACU in stable condition.  Plan: Office follow-up for catheter removal 04/02/2024 Will notify with pathology report    Braylin Formby C. Twylla,  MD

## 2024-03-30 ENCOUNTER — Encounter: Payer: Self-pay | Admitting: Urology

## 2024-04-03 ENCOUNTER — Encounter: Payer: Self-pay | Admitting: Physician Assistant

## 2024-04-03 ENCOUNTER — Ambulatory Visit: Admitting: Physician Assistant

## 2024-04-03 VITALS — BP 189/79 | HR 101 | Ht 63.0 in | Wt 121.2 lb

## 2024-04-03 DIAGNOSIS — N3289 Other specified disorders of bladder: Secondary | ICD-10-CM

## 2024-04-03 NOTE — Progress Notes (Addendum)
 Catheter Removal  Patient is present today for a catheter removal.  10ml of water was drained from the balloon. A 18FR foley cath was removed from the bladder, no complications were noted. Patient tolerated well.  Performed by: Myrtha Dante LATHER  Follow up: Return for Will share pathology results when available.

## 2024-04-04 ENCOUNTER — Inpatient Hospital Stay: Attending: Oncology

## 2024-04-04 ENCOUNTER — Inpatient Hospital Stay (HOSPITAL_BASED_OUTPATIENT_CLINIC_OR_DEPARTMENT_OTHER): Admitting: Oncology

## 2024-04-04 ENCOUNTER — Encounter: Payer: Self-pay | Admitting: Oncology

## 2024-04-04 ENCOUNTER — Inpatient Hospital Stay

## 2024-04-04 VITALS — BP 135/64 | HR 108 | Temp 97.3°F | Resp 18 | Wt 121.2 lb

## 2024-04-04 VITALS — BP 138/66 | HR 93 | Temp 98.3°F | Resp 19

## 2024-04-04 DIAGNOSIS — Z5112 Encounter for antineoplastic immunotherapy: Secondary | ICD-10-CM | POA: Diagnosis not present

## 2024-04-04 DIAGNOSIS — F32A Depression, unspecified: Secondary | ICD-10-CM | POA: Insufficient documentation

## 2024-04-04 DIAGNOSIS — Z8551 Personal history of malignant neoplasm of bladder: Secondary | ICD-10-CM | POA: Insufficient documentation

## 2024-04-04 DIAGNOSIS — D649 Anemia, unspecified: Secondary | ICD-10-CM | POA: Diagnosis not present

## 2024-04-04 DIAGNOSIS — Z791 Long term (current) use of non-steroidal anti-inflammatories (NSAID): Secondary | ICD-10-CM | POA: Diagnosis not present

## 2024-04-04 DIAGNOSIS — Z86711 Personal history of pulmonary embolism: Secondary | ICD-10-CM | POA: Insufficient documentation

## 2024-04-04 DIAGNOSIS — Z79899 Other long term (current) drug therapy: Secondary | ICD-10-CM | POA: Diagnosis not present

## 2024-04-04 DIAGNOSIS — N1831 Chronic kidney disease, stage 3a: Secondary | ICD-10-CM | POA: Diagnosis not present

## 2024-04-04 DIAGNOSIS — C3412 Malignant neoplasm of upper lobe, left bronchus or lung: Secondary | ICD-10-CM | POA: Diagnosis present

## 2024-04-04 DIAGNOSIS — C3482 Malignant neoplasm of overlapping sites of left bronchus and lung: Secondary | ICD-10-CM

## 2024-04-04 DIAGNOSIS — Z8711 Personal history of peptic ulcer disease: Secondary | ICD-10-CM | POA: Diagnosis not present

## 2024-04-04 DIAGNOSIS — I129 Hypertensive chronic kidney disease with stage 1 through stage 4 chronic kidney disease, or unspecified chronic kidney disease: Secondary | ICD-10-CM | POA: Diagnosis not present

## 2024-04-04 DIAGNOSIS — F17210 Nicotine dependence, cigarettes, uncomplicated: Secondary | ICD-10-CM | POA: Insufficient documentation

## 2024-04-04 DIAGNOSIS — Z7982 Long term (current) use of aspirin: Secondary | ICD-10-CM | POA: Insufficient documentation

## 2024-04-04 DIAGNOSIS — Z85118 Personal history of other malignant neoplasm of bronchus and lung: Secondary | ICD-10-CM | POA: Diagnosis not present

## 2024-04-04 DIAGNOSIS — Z91041 Radiographic dye allergy status: Secondary | ICD-10-CM | POA: Insufficient documentation

## 2024-04-04 DIAGNOSIS — C787 Secondary malignant neoplasm of liver and intrahepatic bile duct: Secondary | ICD-10-CM | POA: Diagnosis present

## 2024-04-04 DIAGNOSIS — C771 Secondary and unspecified malignant neoplasm of intrathoracic lymph nodes: Secondary | ICD-10-CM | POA: Diagnosis not present

## 2024-04-04 DIAGNOSIS — Z90710 Acquired absence of both cervix and uterus: Secondary | ICD-10-CM | POA: Insufficient documentation

## 2024-04-04 DIAGNOSIS — E1122 Type 2 diabetes mellitus with diabetic chronic kidney disease: Secondary | ICD-10-CM | POA: Insufficient documentation

## 2024-04-04 DIAGNOSIS — C679 Malignant neoplasm of bladder, unspecified: Secondary | ICD-10-CM | POA: Diagnosis not present

## 2024-04-04 DIAGNOSIS — Z7951 Long term (current) use of inhaled steroids: Secondary | ICD-10-CM | POA: Diagnosis not present

## 2024-04-04 LAB — CBC WITH DIFFERENTIAL (CANCER CENTER ONLY)
Abs Immature Granulocytes: 0.06 K/uL (ref 0.00–0.07)
Basophils Absolute: 0 K/uL (ref 0.0–0.1)
Basophils Relative: 0 %
Eosinophils Absolute: 0.2 K/uL (ref 0.0–0.5)
Eosinophils Relative: 2 %
HCT: 33.2 % — ABNORMAL LOW (ref 36.0–46.0)
Hemoglobin: 10.6 g/dL — ABNORMAL LOW (ref 12.0–15.0)
Immature Granulocytes: 1 %
Lymphocytes Relative: 15 %
Lymphs Abs: 1.6 K/uL (ref 0.7–4.0)
MCH: 28 pg (ref 26.0–34.0)
MCHC: 31.9 g/dL (ref 30.0–36.0)
MCV: 87.8 fL (ref 80.0–100.0)
Monocytes Absolute: 0.6 K/uL (ref 0.1–1.0)
Monocytes Relative: 6 %
Neutro Abs: 8.3 K/uL — ABNORMAL HIGH (ref 1.7–7.7)
Neutrophils Relative %: 76 %
Platelet Count: 294 K/uL (ref 150–400)
RBC: 3.78 MIL/uL — ABNORMAL LOW (ref 3.87–5.11)
RDW: 16.4 % — ABNORMAL HIGH (ref 11.5–15.5)
WBC Count: 10.8 K/uL — ABNORMAL HIGH (ref 4.0–10.5)
nRBC: 0 % (ref 0.0–0.2)

## 2024-04-04 LAB — SURGICAL PATHOLOGY

## 2024-04-04 LAB — CMP (CANCER CENTER ONLY)
ALT: 21 U/L (ref 0–44)
AST: 28 U/L (ref 15–41)
Albumin: 4 g/dL (ref 3.5–5.0)
Alkaline Phosphatase: 81 U/L (ref 38–126)
Anion gap: 12 (ref 5–15)
BUN: 14 mg/dL (ref 8–23)
CO2: 28 mmol/L (ref 22–32)
Calcium: 10 mg/dL (ref 8.9–10.3)
Chloride: 101 mmol/L (ref 98–111)
Creatinine: 1.16 mg/dL — ABNORMAL HIGH (ref 0.44–1.00)
GFR, Estimated: 47 mL/min — ABNORMAL LOW (ref >60)
Glucose, Bld: 187 mg/dL — ABNORMAL HIGH (ref 70–99)
Potassium: 3.8 mmol/L (ref 3.5–5.1)
Sodium: 141 mmol/L (ref 135–145)
Total Bilirubin: 0.3 mg/dL (ref 0.0–1.2)
Total Protein: 7 g/dL (ref 6.5–8.1)

## 2024-04-04 MED ORDER — SODIUM CHLORIDE 0.9 % IV SOLN
Freq: Once | INTRAVENOUS | Status: AC
  Filled 2024-04-04: qty 250

## 2024-04-04 MED ORDER — SODIUM CHLORIDE 0.9 % IV SOLN
200.0000 mg | Freq: Once | INTRAVENOUS | Status: AC
  Administered 2024-04-04: 200 mg via INTRAVENOUS
  Filled 2024-04-04: qty 8

## 2024-04-04 NOTE — Progress Notes (Signed)
 Hematology/Oncology Progress note [phone] [phone]       REASON OF VISIT Stage IV Lung squamous cell carcinoma  ASSESSMENT & PLAN:   Cancer Staging  Malignant neoplasm of lung (HCC) Staging form: Lung, AJCC 8th Edition - Clinical: Stage IVA (cT4, cN3, cM1a) - Signed by Babara Call, MD on 06/28/2022  Urothelial carcinoma of bladder [facility]) Staging form: Urinary Bladder, AJCC 8th Edition - Clinical stage from 12/21/2022: Stage I (cT1, cN0, cM0) - Signed by Babara Call, MD on 12/29/2022   Malignant neoplasm of lung (HCC) Non-small cell lung cancer, favor squamous cell carcinoma, stage IV- TPS 90%, TMB 10, KRAS G12V MRI brain with and without contrast -  Left occipital focus of prior hemorrhage with mild linear contrast enhancement. Possible small metastasis or microhemmorhagic event.  TPS 90%, CT in Oct 2025 showed stable disease Labs are reviewed and discussed with patient.  Proceed with Keytruda    Encounter for antineoplastic immunotherapy Immunotherapy treatment plan as listed above.  Normocytic anemia Chronic anemia.  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  03/29/2023 Non-muscle invasive bladder cancer. S/p TURBT Patient is s/p intravesical BCG treatments-she is off  suppressive antibiotics Discussed with urology. Oct 2025 CT showed increased bladder mass.  S/p  repeat cystoscopy and resection pathology is pending. .     Orders Placed This Encounter  Procedures   CBC with Differential (Cancer Center Only)    Standing Status:   Future    Expected Date:   05/16/2024    Expiration Date:   05/16/2025   CMP (Cancer Center only)    Standing Status:   Future    Expected Date:   05/16/2024    Expiration Date:   05/16/2025   T4    Standing Status:   Future    Expected Date:   05/16/2024    Expiration Date:   05/16/2025   TSH    Standing Status:   Future     Expected Date:   05/16/2024    Expiration Date:   05/16/2025   Follow up in 3 weeks.   All questions were answered. The patient knows to call the clinic with any problems, questions or concerns.  Call Babara, MD, PhD [facility] Hematology Oncology 04/04/2024       HISTORY OF PRESENTING ILLNESS:  Regina Perkins 83 y.o. 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 I have reviewed her chart and materials related to her cancer extensively and collaborated history with the patient. Summary of oncologic history is as follows: Oncology History  Malignant neoplasm of lung (HCC)  05/24/2022 Imaging   CT chest angiogram showed 1. No evidence for pulmonary embolism. 2. Left upper lobe/prevascular mass worrisome for neoplasm. 3. Mediastinal and hilar lymphadenopathy. 4. Multiple pulmonary nodules measuring up to 11 mm worrisome for metastatic disease. 5. Small left pleural effusion. 6. Patchy ground-glass and airspace opacities in the left upper lobe worrisome for infection. 7. Moderate-sized pericardial effusion. 8. Cholelithiasis. 9. Nonobstructing left renal calculus   05/25/2022 Imaging   CT abdomen pelvis wo contrast 1. No acute findings or explanation for the patient's symptoms. 2. No evidence of primary malignancy or metastatic disease within the abdomen or pelvis on noncontrast imaging.  3. Cholelithiasis without evidence of cholecystitis or biliary dilatation. 4. Nonobstructing left renal calculus. 5. Moderate stool throughout the colon suggesting constipation.  6.  Aortic Atherosclerosis    05/26/2022 Initial Diagnosis   Malignant neoplasm of lung - TPS 90%, TMB 10, KRAS G12V  -05/22/2022 in the emergency room, she was found to have hypoxia with oxygen levels of 86% on room air, improved to 94 with 2 L of oxygen.  D-dimer was elevated at 1.29, troponin negative.  Lower extremity Doppler  was negative for DVT.  VQ scan high probability PE (Large segmental perfusion defect of the left upper lobe without corresponding radiographic abnormality. Additional bilateral wedge-shaped subsegmental branch perfusion defects .  Patient was admitted and started on heparin  Echocardiogram showed no RV strain.  Moderate pericardial effusion. Over her hospitalization, she continues to have shortness of breath with minimal exertion. 05/24/2022, CT chest angiogram showed left lung mass with bilateral lung nodules.   06/16/2022 Imaging   PET scan  1. 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 No evidence of metastatic disease in the abdomen or pelvis. 2. Moderate pericardial effusion. 3. Tiny left pleural effusion. 4. Left renal stone. 5. Aortic atherosclerosis (ICD10-I70.0). Coronary artery calcification. 6. 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    06/28/2022 Cancer Staging   Staging form: Lung, AJCC 8th Edition - Clinical: Stage IVA (cT4, cN3, cM1a) - Signed by Babara Call, MD on 06/28/2022   07/14/2022 -  Chemotherapy   Patient is on Treatment Plan : LUNG NSCLC Pembrolizumab  (200) q21d     09/29/2022 Imaging   CT chest abdomen pelvis w contrast showed Bilateral pulmonary nodules, some of which are waxing/waning, but favoring multifocal lung carcinoma/metastases.   Improving thoracic nodal metastases, as above.  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 Consider cystoscopy as clinically warranted.  Aortic Atherosclerosis  (ICD10-I70.0) and Emphysema (ICD10-J43.9)   01/06/2023 Imaging   CT chest abdomen pelvis w contrast showed 1. Interval decrease in size of multiple spiculated and subsolid pulmonary nodules, consistent with treatment response. 2. No significant change in matted post treatment prevascular and left superior mediastinal soft tissue. 3. Diminished size of an endoluminal bladder mass seen on prior examination, soft tissue in this vicinity now measuring no greater than 2.1 x 1.4 cm, previously at least 4.7 x 2.1 cm. Presumably this has been at least partially resected. 4. No evidence of lymphadenopathy or metastatic disease in the abdomen or pelvis. 5. Cholelithiasis. 6. Nonobstructive left nephrolithiasis. 7. Coronary artery disease.   01/10/2023 Procedure   Medi port placed by IR   05/11/2023 Imaging   CT chest abdomen pelvis w contrast showed 1. Unchanged matted, treated prevascular and left superior mediastinal soft tissue. 2. Multiple bilateral spiculated and ground-glass pulmonary nodules not significantly changed. 3. No residual mass of the posterior right aspect of the urinary bladder. Mild residual thickening in this vicinity. Interval placement of right-sided double-J ureteral stent catheter. 4. No evidence of lymphadenopathy or metastatic disease in the abdomen or pelvis. 5. Emphysema and diffuse bilateral bronchial wall thickening. 6. Coronary artery disease.   Aortic Atherosclerosis (ICD10-I70.0) and Emphysema (ICD10-J43.9).    02/15/2024 Imaging   CT chest abdomen pelvis wo contrast showed  1. Unchanged bilateral pulmonary nodules and consolidative opacity of the paramedian left upper lobe. 2. No  significant change in matted soft tissue or treated lymph nodes in the prevascular station. No discretely enlarged mediastinal, hilar, or axillary lymph nodes. 3. Endoluminal mass of the right aspect of the urinary bladder, better seen on today's examination and appears to  have enlarged at 3.1 x 1.7 cm, difficult to confidently measure on prior although approximately 1.9 x 1.2 cm. This is most consistent with a primary bladder malignancy. 4. Right-sided double-J ureteral stent catheter with formed pigtails in the right renal pelvis and urinary bladder. Unchanged mild right hydronephrosis. 5. Cholelithiasis. 6. Coronary artery disease.     Urothelial carcinoma of bladder (HCC)  10/06/2022 Initial Diagnosis   Urothelial carcinoma of bladder S/p TURBT  1. 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 Bladder, biopsy, Base of tumor HIGH GRADE PAPILLARY UROTHELIAL CARCINOMA SUBMUCOSA AND MUSCULARIS PROPRIA IS PRESENT AND NOT INVOLVED BY CARCINOMA  Postprocedure, patient developed gross hematuria and was admitted to the hospital.  She received PRBC transfusion.  Hemoglobin improved and she was discharged.    12/21/2022 Cancer Staging   Staging form: Urinary Bladder, AJCC 8th Edition - Clinical stage from 12/21/2022: Stage I (cT1, cN0, cM0) - Signed by Babara Call, MD on 12/29/2022 WHO/ISUP grade (low/high): High Grade Histologic grading system: 2 grade system   02/15/2024 Imaging   CT chest abdomen pelvis wo contrast showed  1. Unchanged bilateral pulmonary nodules and consolidative opacity of the paramedian left upper lobe. 2. No significant change in matted soft tissue or treated lymph nodes in the prevascular station. No discretely enlarged mediastinal, hilar, or axillary lymph nodes. 3. Endoluminal mass of the right aspect of the urinary bladder, better seen on today's examination and appears to have enlarged at 3.1 x 1.7 cm, difficult to confidently measure on prior although approximately 1.9 x 1.2 cm. This is most consistent with a primary bladder  malignancy. 4. Right-sided double-J ureteral stent catheter with formed pigtails in the right renal pelvis and urinary bladder. Unchanged mild right hydronephrosis. 5. Cholelithiasis. 6. Coronary artery disease.       INTERVAL HISTORY Sherrell L Xu is a 83 y.o. female who has above history reviewed by me today presents for follow up visit for Lung squamous cell carcinoma She tolerates keytuda well. Denies skin rash, diarrhea.  Patient has no new complaints.  Denies chest pain, chest palpitation. She denies headache, focal weakness or slurred speech.  S/p cystoscopy and bladder tumor resection.    MEDICAL HISTORY:  Past Medical History:  Diagnosis Date   Atherosclerosis of native arteries of extremity with intermittent claudication    Bladder mass    Chronic kidney disease, stage 3a (HCC)    COPD (chronic obstructive pulmonary disease) (HCC)    Diabetes mellitus without complication (HCC)    Gastrointestinal hemorrhage    GERD (gastroesophageal reflux disease)    Glaucoma    History of duodenal ulcer    HTN (hypertension)    Hypertension    Hypokalemia    Hypomagnesemia    Leukocytosis    Lung mass    Malignant neoplasm of lung (HCC) 06/28/2022   a.) stage IVA (cT4, cN3, cM1a) favor squamous cell carcinoma --> TPS 90%, TMB 10, KRAS G12V   Malnutrition    Normocytic anemia    Pericardial effusion 05/2022   Pulmonary embolism (HCC)    Sepsis (HCC)    Tobacco abuse  Type II diabetes mellitus with renal manifestations (HCC)    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   a.) stage I (cT1, cN0, cM0)    SURGICAL HISTORY: Past Surgical History:  Procedure Laterality Date   ABDOMINAL HYSTERECTOMY     BACK SURGERY     1988 and 1998   CYSTOSCOPY W/ RETROGRADES Bilateral 12/21/2022   Procedure: CYSTOSCOPY WITH RETROGRADE PYELOGRAM;  Surgeon: Twylla Glendia BROCKS, MD;  Location: [facility];  Service: Urology;  Laterality: Bilateral;   CYSTOSCOPY W/ URETERAL STENT  PLACEMENT Right 03/29/2024   Procedure: CYSTOSCOPY, FLEXIBLE, WITH STENT REPLACEMENT;  Surgeon: Twylla Glendia BROCKS, MD;  Location: [facility];  Service: Urology;  Laterality: Right;   CYSTOSCOPY W/ URETERAL STENT REMOVAL Right 03/29/2024   Procedure: REMOVAL, STENT, URETER, CYSTOSCOPIC;  Surgeon: Twylla Glendia BROCKS, MD;  Location: [facility];  Service: Urology;  Laterality: Right;   CYSTOSCOPY WITH BIOPSY N/A 03/29/2023   Procedure: CYSTOSCOPY WITH BLADDER BIOPSY;  Surgeon: Twylla Glendia BROCKS, MD;  Location: [facility];  Service: Urology;  Laterality: N/A;   CYSTOSCOPY WITH STENT PLACEMENT Right 03/29/2023   Procedure: CYSTOSCOPY WITH STENT PLACEMENT;  Surgeon: Twylla Glendia BROCKS, MD;  Location: [facility];  Service: Urology;  Laterality: Right;   ESOPHAGOGASTRODUODENOSCOPY (EGD) WITH PROPOFOL  N/A 07/29/2022   Procedure: ESOPHAGOGASTRODUODENOSCOPY (EGD) WITH PROPOFOL ;  Surgeon: Therisa Bi, MD;  Location: [facility];  Service: Gastroenterology;  Laterality: N/A;   ESOPHAGOGASTRODUODENOSCOPY (EGD) WITH PROPOFOL  N/A 01/21/2023   Procedure: ESOPHAGOGASTRODUODENOSCOPY (EGD) WITH PROPOFOL ;  Surgeon: Therisa Bi, MD;  Location: [facility];  Service: Gastroenterology;  Laterality: N/A;   HOLMIUM LASER APPLICATION Right 03/29/2023   Procedure: HOLMIUM LASER ABLATION;  Surgeon: Twylla Glendia BROCKS, MD;  Location: [facility];  Service: Urology;  Laterality: Right;   IR IMAGING GUIDED PORT INSERTION  01/10/2023   TRANSURETHRAL RESECTION OF BLADDER TUMOR N/A 12/21/2022   Procedure: TRANSURETHRAL RESECTION OF BLADDER TUMOR (TURBT);  Surgeon: Twylla Glendia BROCKS, MD;  Location: [facility];  Service: Urology;  Laterality: N/A;   TRANSURETHRAL RESECTION OF BLADDER TUMOR N/A 03/29/2023   Procedure: TRANSURETHRAL RESECTION OF BLADDER TUMOR (TURBT);  Surgeon: Twylla Glendia BROCKS, MD;  Location: [facility];  Service: Urology;  Laterality: N/A;   TRANSURETHRAL RESECTION OF BLADDER TUMOR N/A 03/29/2024   Procedure: TURBT (TRANSURETHRAL RESECTION OF  BLADDER TUMOR);  Surgeon: Twylla Glendia BROCKS, MD;  Location: [facility];  Service: Urology;  Laterality: N/A;   URETERAL BIOPSY Right 03/29/2024   Procedure: BIOPSY, URETER;  Surgeon: Twylla Glendia BROCKS, MD;  Location: [facility];  Service: Urology;  Laterality: Right;  /Fulguration   URETEROSCOPY Right 03/29/2023   Procedure: DIAGNOSTIC URETEROSCOPY;  Surgeon: Twylla Glendia BROCKS, MD;  Location: [facility];  Service: Urology;  Laterality: Right;   URETEROSCOPY Right 03/29/2024   Procedure: URETEROSCOPY;  Surgeon: Twylla Glendia BROCKS, MD;  Location: [facility];  Service: Urology;  Laterality: Right;    SOCIAL HISTORY: Social History   Socioeconomic History   Marital status: Widowed    Spouse name: Not on file   Number of children: Not on file   Years of education: Not on file   Highest education level: Not on file  Occupational History   Not on file  Tobacco Use   Smoking status: Every Day    Current packs/day: 0.25    Types: Cigarettes   Smokeless tobacco: Never  Vaping Use   Vaping status: Never Used  Substance and Sexual Activity   Alcohol use: No   Drug use: No   Sexual  activity: Not on file  Other Topics Concern   Not on file  Social History Narrative   Not on file   Social Drivers of Health   Financial Resource Strain: Low Risk  (06/28/2022)   Overall Financial Resource Strain (CARDIA)    Difficulty of Paying Living Expenses: Not very hard  Food Insecurity: No Food Insecurity (12/22/2022)   Hunger Vital Sign    Worried About Running Out of Food in the Last Year: Never true    Ran Out of Food in the Last Year: Never true  Transportation Needs: No Transportation Needs (12/22/2022)   PRAPARE - Administrator, Civil Service (Medical): No    Lack of Transportation (Non-Medical): No  Physical Activity: Not on file  Stress: No Stress Concern Present (06/28/2022)   [employer] of Occupational Health - Occupational Stress Questionnaire    Feeling of Stress : Not at all  Social  Connections: Not on file  Intimate Partner Violence: Not At Risk (12/22/2022)   Humiliation, Afraid, Rape, and Kick questionnaire    Fear of Current or Ex-Partner: No    Emotionally Abused: No    Physically Abused: No    Sexually Abused: No    FAMILY HISTORY: Family History  Problem Relation Age of Onset   Asthma Mother    Heart disease Mother    Hypertension Mother    Diabetes Mother    Stroke Mother    Diabetes Sister    Diabetes Brother     ALLERGIES:  is allergic to ivp dye [iodinated contrast media] and shellfish allergy.  MEDICATIONS:  Current Outpatient Medications  Medication Sig Dispense Refill   albuterol  (VENTOLIN  HFA) 108 (90 Base) MCG/ACT inhaler Inhale 2 puffs into the lungs every 4 (four) hours as needed for wheezing or shortness of breath.     amLODipine  (NORVASC ) 10 MG tablet Take 10 mg by mouth every morning.     aspirin  EC 81 MG tablet Take 1 tablet (81 mg total) by mouth daily. Swallow whole. 30 tablet 12   cyanocobalamin  (VITAMIN B12) 1000 MCG tablet Take 1 tablet (1,000 mcg total) by mouth daily. 90 tablet 0   fluticasone -salmeterol (ADVAIR) 250-50 MCG/ACT AEPB Inhale 1 puff into the lungs.     lidocaine -prilocaine  (EMLA ) cream Apply 1 Application topically as needed. Apply to port and cover with saran wrap 1-2 hours prior to port access 30 g 1   lisinopril  (ZESTRIL ) 40 MG tablet Take 40 mg by mouth every morning.     loratadine  (CLARITIN ) 10 MG tablet Take 10 mg by mouth daily.     meloxicam (MOBIC) 7.5 MG tablet Take 7.5-15 mg by mouth daily.     OXYGEN Inhale 2 L into the lungs as directed.     No current facility-administered medications for this visit.    Review of Systems  Constitutional:  Negative for appetite change, chills, fatigue and fever.  HENT:   Negative for hearing loss and voice change.   Eyes:  Negative for eye problems.  Respiratory:  Negative for chest tightness, cough and shortness of breath.   Cardiovascular:  Negative for chest  pain.  Gastrointestinal:  Negative for abdominal distention, abdominal pain and blood in stool.  Endocrine: Negative for hot flashes.  Genitourinary:  Negative for difficulty urinating and frequency.   Musculoskeletal:  Negative for arthralgias.  Skin:  Negative for itching and rash.  Neurological:  Negative for extremity weakness.  Hematological:  Negative for adenopathy.  Psychiatric/Behavioral:  Negative for confusion.  PHYSICAL EXAMINATION: ECOG PERFORMANCE STATUS: 2 - Symptomatic, <50% confined to bed  Vitals:   04/04/24 0931 04/04/24 0937  BP: (!) 153/70 135/64  Pulse: (!) 108   Resp: 18   Temp: (!) 97.3 F (36.3 C)   SpO2: 98%     Filed Weights   04/04/24 0931  Weight: 121 lb 3.2 oz (55 kg)      Physical Exam Constitutional:      General: She is not in acute distress.    Appearance: She is not diaphoretic.  HENT:     Head: Normocephalic.  Eyes:     General: No scleral icterus.    Pupils: Pupils are equal, round, and reactive to light.  Cardiovascular:     Rate and Rhythm: Tachycardia present.     Heart sounds: No murmur heard. Pulmonary:     Effort: Pulmonary effort is normal. No respiratory distress.     Comments: Decreased breath sounds bilaterally Abdominal:     General: There is no distension.  Musculoskeletal:        General: Normal range of motion.     Cervical back: Normal range of motion.  Skin:    Findings: No erythema.  Neurological:     Mental Status: She is alert and oriented to person, place, and time. Mental status is at baseline.     Cranial Nerves: No cranial nerve deficit.     Motor: No abnormal muscle tone.  Psychiatric:        Mood and Affect: Affect normal.      LABORATORY DATA:  I have reviewed the data as listed    Latest Ref Rng & Units 04/04/2024    9:10 AM 03/14/2024    8:37 AM 02/22/2024    8:39 AM  CBC  WBC 4.0 - 10.5 K/uL 10.8  7.4  6.5   Hemoglobin 12.0 - 15.0 g/dL 89.3  88.4  89.3   Hematocrit 36.0 -  46.0 % 33.2  35.9  33.1   Platelets 150 - 400 K/uL 294  241  282       Latest Ref Rng & Units 04/04/2024    9:10 AM 03/14/2024    8:37 AM 02/22/2024    8:39 AM  CMP  Glucose 70 - 99 mg/dL 812  865  875   BUN 8 - 23 mg/dL 14  22  20    Creatinine 0.44 - 1.00 mg/dL 8.83  8.81  9.04   Sodium 135 - 145 mmol/L 141  138  139   Potassium 3.5 - 5.1 mmol/L 3.8  3.7  3.3   Chloride 98 - 111 mmol/L 101  103  104   CO2 22 - 32 mmol/L 28  27  25    Calcium  8.9 - 10.3 mg/dL 89.9  9.2  9.3   Total Protein 6.5 - 8.1 g/dL 7.0  7.3  7.3   Total Bilirubin 0.0 - 1.2 mg/dL 0.3  0.5  0.5   Alkaline Phos 38 - 126 U/L 81  69  65   AST 15 - 41 U/L 28  25  26    ALT 0 - 44 U/L 21  16  16       RADIOGRAPHIC STUDIES: I have personally reviewed the radiological images as listed and agreed with the findings in the report. DG OR UROLOGY CYSTO IMAGE ([facility] ONLY) Result Date: 03/29/2024 There is no interpretation for this exam.  This order is for images obtained during a surgical procedure.  Please See Surgeries Tab for more  information regarding the procedure.

## 2024-04-04 NOTE — Assessment & Plan Note (Signed)
 Non-small cell lung cancer, favor squamous cell carcinoma, stage IV- TPS 90%, TMB 10, KRAS G12V MRI brain with and without contrast -  Left occipital focus of prior hemorrhage with mild linear contrast enhancement. Possible small metastasis or microhemmorhagic event.  TPS 90%, CT in Oct 2025 showed stable disease Labs are reviewed and discussed with patient.  Proceed with Keytruda 

## 2024-04-04 NOTE — Assessment & Plan Note (Signed)
 Immunotherapy treatment plan as listed above.

## 2024-04-04 NOTE — Assessment & Plan Note (Signed)
 12/21/2022 Non-muscle invasive bladder cancer. S/p TURBT, T1 lesion, high grade.  03/29/2023 Non-muscle invasive bladder cancer. S/p TURBT Patient is s/p intravesical BCG treatments-she is off  suppressive antibiotics Discussed with urology. Oct 2025 CT showed increased bladder mass.  S/p  repeat cystoscopy and resection pathology is pending. SABRA

## 2024-04-04 NOTE — Assessment & Plan Note (Signed)
 Chronic anemia.  Hemoglobin is stable

## 2024-04-06 ENCOUNTER — Other Ambulatory Visit

## 2024-04-06 ENCOUNTER — Inpatient Hospital Stay: Admitting: Internal Medicine

## 2024-04-06 VITALS — BP 154/82 | HR 97 | Resp 18 | Ht 63.0 in | Wt 121.0 lb

## 2024-04-06 DIAGNOSIS — I63231 Cerebral infarction due to unspecified occlusion or stenosis of right carotid arteries: Secondary | ICD-10-CM

## 2024-04-06 DIAGNOSIS — Z5112 Encounter for antineoplastic immunotherapy: Secondary | ICD-10-CM | POA: Diagnosis not present

## 2024-04-06 LAB — LIPID PANEL
Cholesterol: 153 mg/dL (ref 0–200)
HDL: 58 mg/dL (ref >40)
LDL Cholesterol: 81 mg/dL (ref 0–99)
Total CHOL/HDL Ratio: 2.6 ratio
Triglycerides: 70 mg/dL (ref <150)
VLDL: 14 mg/dL (ref 0–40)

## 2024-04-06 NOTE — Progress Notes (Signed)
 [facility] at [facility] [address]  [place], [place] [address] [phone]   Interval Evaluation  Date of Service: 04/06/2024 Patient Name: Regina Perkins Patient MRN: [id number] Patient DOB: 1941-02-16 Provider: Arthea MARLA Manns, MD  Identifying Statement:  Regina Perkins is a 83 y.o. 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 No evidence for pulmonary embolism. 2. Left upper lobe/prevascular mass worrisome for neoplasm. 3. Mediastinal and hilar lymphadenopathy. 4. Multiple pulmonary nodules measuring up to 11 mm worrisome for metastatic disease. 5. Small left pleural effusion. 6. Patchy ground-glass and airspace opacities in the left upper lobe worrisome for infection. 7. Moderate-sized pericardial effusion. 8. Cholelithiasis. 9. Nonobstructing left renal calculus   05/25/2022 Imaging   CT abdomen pelvis wo contrast 1. No acute findings or explanation for the patient's symptoms. 2. No evidence of primary malignancy or metastatic disease within the abdomen or pelvis on noncontrast imaging.  3. Cholelithiasis without evidence of cholecystitis or biliary dilatation. 4. Nonobstructing left renal calculus. 5. Moderate stool throughout the colon suggesting constipation. 6.  Aortic Atherosclerosis    05/26/2022 Initial Diagnosis   Malignant neoplasm of lung - TPS 90%, TMB 10, KRAS G12V  -05/22/2022 in the emergency room, she was found to have hypoxia with oxygen levels of 86% on room air, improved to 94 with 2 L of oxygen.  D-dimer was elevated at 1.29, troponin negative.  Lower extremity Doppler was negative for DVT.  VQ scan high probability PE (Large segmental perfusion defect of the left upper lobe without corresponding radiographic abnormality. Additional  bilateral wedge-shaped subsegmental branch perfusion defects .  Patient was admitted and started on heparin  Echocardiogram showed no RV strain.  Moderate pericardial effusion. Over her hospitalization, she continues to have shortness of breath with minimal exertion. 05/24/2022, CT chest angiogram showed left lung mass with bilateral lung nodules.   06/16/2022 Imaging   PET scan  1. 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 No evidence of metastatic disease in the abdomen or pelvis. 2. Moderate pericardial effusion. 3. Tiny left pleural effusion. 4. Left renal stone. 5. Aortic atherosclerosis (ICD10-I70.0). Coronary artery calcification. 6. 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    06/28/2022 Cancer Staging   Staging form: Lung, AJCC 8th Edition - Clinical: Stage IVA (cT4, cN3, cM1a) - Signed by Babara Call, MD on 06/28/2022   07/14/2022 -  Chemotherapy   Patient is on Treatment Plan : LUNG NSCLC Pembrolizumab  (200) q21d     09/29/2022 Imaging   CT chest abdomen pelvis w contrast showed Bilateral pulmonary nodules, some of which are waxing/waning, but favoring multifocal lung carcinoma/metastases.   Improving thoracic nodal metastases, as above.  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 Consider cystoscopy as clinically warranted.  Aortic Atherosclerosis (ICD10-I70.0) and Emphysema (ICD10-J43.9)   01/06/2023 Imaging   CT chest abdomen pelvis w contrast showed 1. Interval decrease in size of multiple spiculated and  subsolid pulmonary nodules, consistent with treatment response. 2. No  significant change in matted post treatment prevascular and left superior mediastinal soft tissue. 3. Diminished size of an endoluminal bladder mass seen on prior examination, soft tissue in this vicinity now measuring no greater than 2.1 x 1.4 cm, previously at least 4.7 x 2.1 cm. Presumably this has been at least partially resected. 4. No evidence of lymphadenopathy or metastatic disease in the abdomen or pelvis. 5. Cholelithiasis. 6. Nonobstructive left nephrolithiasis. 7. Coronary artery disease.   01/10/2023 Procedure   Medi port placed by IR   05/11/2023 Imaging   CT chest abdomen pelvis w contrast showed 1. Unchanged matted, treated prevascular and left superior mediastinal soft tissue. 2. Multiple bilateral spiculated and ground-glass pulmonary nodules not significantly changed. 3. No residual mass of the posterior right aspect of the urinary bladder. Mild residual thickening in this vicinity. Interval placement of right-sided double-J ureteral stent catheter. 4. No evidence of lymphadenopathy or metastatic disease in the abdomen or pelvis. 5. Emphysema and diffuse bilateral bronchial wall thickening. 6. Coronary artery disease.   Aortic Atherosclerosis (ICD10-I70.0) and Emphysema (ICD10-J43.9).    02/15/2024 Imaging   CT chest abdomen pelvis wo contrast showed  1. Unchanged bilateral pulmonary nodules and consolidative opacity of the paramedian left upper lobe. 2. No significant change in matted soft tissue or treated lymph nodes in the prevascular station. No discretely enlarged mediastinal, hilar, or axillary lymph nodes. 3. Endoluminal mass of the right aspect of the urinary bladder, better seen on today's examination and appears to have enlarged at 3.1 x 1.7 cm, difficult to confidently measure on prior although approximately 1.9 x 1.2 cm. This is most consistent with a primary bladder  malignancy. 4. Right-sided double-J ureteral stent catheter with formed pigtails in the right renal pelvis and urinary bladder. Unchanged mild right hydronephrosis. 5. Cholelithiasis. 6. Coronary artery disease.     Urothelial carcinoma of bladder (HCC)  10/06/2022 Initial Diagnosis   Urothelial carcinoma of bladder S/p TURBT  1. 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 Bladder, biopsy, Base of tumor HIGH GRADE PAPILLARY UROTHELIAL CARCINOMA SUBMUCOSA AND MUSCULARIS PROPRIA IS PRESENT AND NOT INVOLVED BY CARCINOMA  Postprocedure, patient developed gross hematuria and was admitted to the hospital.  She received PRBC transfusion.  Hemoglobin improved and she was discharged.    12/21/2022 Cancer Staging   Staging form: Urinary Bladder, AJCC 8th Edition - Clinical stage from 12/21/2022: Stage I (cT1, cN0, cM0) - Signed by Babara Call, MD on 12/29/2022 WHO/ISUP grade (low/high): High Grade Histologic grading system: 2 grade system   02/15/2024 Imaging   CT chest abdomen pelvis wo contrast showed  1. Unchanged bilateral pulmonary nodules and consolidative opacity of the paramedian left upper lobe. 2. No significant change in matted soft tissue or treated lymph nodes in the prevascular station. No discretely enlarged mediastinal, hilar, or axillary lymph nodes. 3. Endoluminal mass of the right aspect of the urinary bladder, better seen on today's examination and appears to have enlarged at 3.1 x 1.7 cm, difficult to confidently measure on prior although approximately 1.9 x 1.2 cm. This is most consistent with a primary bladder malignancy. 4. Right-sided double-J ureteral stent catheter with formed pigtails in the right renal pelvis and urinary bladder. Unchanged mild  right hydronephrosis. 5. Cholelithiasis. 6. Coronary artery disease.       Interval History: Regina Perkins presents today for clinical follow  up.  She describes no new or progressive neurologic deficits.  No headaches or seizures.  At home she is walking without assistance.  Remains independent with activities of living except for grocery shopping. Continues on Keytruda  with Dr. Babara.  Also recently diagnosed with bladder cancer, had surgery for this.   H+P (08/19/23) Patient presents today to evaluate recent findings on MRI brain.  She denies any neurologic symptoms.  No slurred speech, hand weakness, or gait difficulties aside from orthopedic limitations.  She is taking aspirin  81mg  daily since March.  Continues on Keytruda  with Dr. Babara.  Medications: Current Outpatient Medications on File Prior to Visit  Medication Sig Dispense Refill   albuterol  (VENTOLIN  HFA) 108 (90 Base) MCG/ACT inhaler Inhale 2 puffs into the lungs every 4 (four) hours as needed for wheezing or shortness of breath.     amLODipine  (NORVASC ) 10 MG tablet Take 10 mg by mouth every morning.     aspirin  EC 81 MG tablet Take 1 tablet (81 mg total) by mouth daily. Swallow whole. 30 tablet 12   cyanocobalamin  (VITAMIN B12) 1000 MCG tablet Take 1 tablet (1,000 mcg total) by mouth daily. 90 tablet 0   fluticasone -salmeterol (ADVAIR) 250-50 MCG/ACT AEPB Inhale 1 puff into the lungs.     lidocaine -prilocaine  (EMLA ) cream Apply 1 Application topically as needed. Apply to port and cover with saran wrap 1-2 hours prior to port access 30 g 1   lisinopril  (ZESTRIL ) 40 MG tablet Take 40 mg by mouth every morning.     loratadine  (CLARITIN ) 10 MG tablet Take 10 mg by mouth daily.     meloxicam (MOBIC) 7.5 MG tablet Take 7.5-15 mg by mouth daily.     OXYGEN Inhale 2 L into the lungs as directed.     No current facility-administered medications on file prior to visit.    Allergies:  Allergies  Allergen Reactions   Ivp Dye [Iodinated  Contrast Media]    Shellfish Allergy Other (See Comments)    Reaction: unknown    Past Medical History:  Past Medical History:  Diagnosis Date   Atherosclerosis of native arteries of extremity with intermittent claudication    Bladder mass    Chronic kidney disease, stage 3a (HCC)    COPD (chronic obstructive pulmonary disease) (HCC)    Diabetes mellitus without complication (HCC)    Gastrointestinal hemorrhage    GERD (gastroesophageal reflux disease)    Glaucoma    History of duodenal ulcer    HTN (hypertension)    Hypertension    Hypokalemia    Hypomagnesemia    Leukocytosis    Lung mass    Malignant neoplasm of lung (HCC) 06/28/2022   a.) stage IVA (cT4, cN3, cM1a) favor squamous cell carcinoma --> TPS 90%, TMB 10, KRAS G12V   Malnutrition    Normocytic anemia    Pericardial effusion 05/2022   Pulmonary embolism (HCC)    Sepsis (HCC)    Tobacco abuse    Type II diabetes mellitus with renal manifestations (HCC)    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   a.) stage I (cT1, cN0, cM0)   Past Surgical History:  Past Surgical History:  Procedure Laterality Date   ABDOMINAL HYSTERECTOMY     BACK SURGERY     1988 and 1998   CYSTOSCOPY W/ RETROGRADES Bilateral 12/21/2022   Procedure: CYSTOSCOPY WITH RETROGRADE PYELOGRAM;  Surgeon: Twylla Glendia BROCKS, MD;  Location: [facility];  Service: Urology;  Laterality: Bilateral;   CYSTOSCOPY W/ URETERAL STENT  PLACEMENT Right 03/29/2024   Procedure: CYSTOSCOPY, FLEXIBLE, WITH STENT REPLACEMENT;  Surgeon: Twylla Glendia BROCKS, MD;  Location: [facility];  Service: Urology;  Laterality: Right;   CYSTOSCOPY W/ URETERAL STENT REMOVAL Right 03/29/2024   Procedure: REMOVAL, STENT, URETER, CYSTOSCOPIC;  Surgeon: Twylla Glendia BROCKS, MD;  Location: [facility];  Service: Urology;  Laterality: Right;   CYSTOSCOPY WITH BIOPSY N/A 03/29/2023   Procedure: CYSTOSCOPY WITH BLADDER BIOPSY;  Surgeon: Twylla Glendia BROCKS, MD;  Location: [facility];  Service: Urology;   Laterality: N/A;   CYSTOSCOPY WITH STENT PLACEMENT Right 03/29/2023   Procedure: CYSTOSCOPY WITH STENT PLACEMENT;  Surgeon: Twylla Glendia BROCKS, MD;  Location: [facility];  Service: Urology;  Laterality: Right;   ESOPHAGOGASTRODUODENOSCOPY (EGD) WITH PROPOFOL  N/A 07/29/2022   Procedure: ESOPHAGOGASTRODUODENOSCOPY (EGD) WITH PROPOFOL ;  Surgeon: Therisa Bi, MD;  Location: [facility];  Service: Gastroenterology;  Laterality: N/A;   ESOPHAGOGASTRODUODENOSCOPY (EGD) WITH PROPOFOL  N/A 01/21/2023   Procedure: ESOPHAGOGASTRODUODENOSCOPY (EGD) WITH PROPOFOL ;  Surgeon: Therisa Bi, MD;  Location: [facility] ENDOSCOPY;  Service: Gastroenterology;  Laterality: N/A;   HOLMIUM LASER APPLICATION Right 03/29/2023   Procedure: HOLMIUM LASER ABLATION;  Surgeon: Twylla Glendia BROCKS, MD;  Location: [facility];  Service: Urology;  Laterality: Right;   IR IMAGING GUIDED PORT INSERTION  01/10/2023   TRANSURETHRAL RESECTION OF BLADDER TUMOR N/A 12/21/2022   Procedure: TRANSURETHRAL RESECTION OF BLADDER TUMOR (TURBT);  Surgeon: Twylla Glendia BROCKS, MD;  Location: [facility];  Service: Urology;  Laterality: N/A;   TRANSURETHRAL RESECTION OF BLADDER TUMOR N/A 03/29/2023   Procedure: TRANSURETHRAL RESECTION OF BLADDER TUMOR (TURBT);  Surgeon: Twylla Glendia BROCKS, MD;  Location: [facility];  Service: Urology;  Laterality: N/A;   TRANSURETHRAL RESECTION OF BLADDER TUMOR N/A 03/29/2024   Procedure: TURBT (TRANSURETHRAL RESECTION OF BLADDER TUMOR);  Surgeon: Twylla Glendia BROCKS, MD;  Location: [facility];  Service: Urology;  Laterality: N/A;   URETERAL BIOPSY Right 03/29/2024   Procedure: BIOPSY, URETER;  Surgeon: Twylla Glendia BROCKS, MD;  Location: [facility];  Service: Urology;  Laterality: Right;  /Fulguration   URETEROSCOPY Right 03/29/2023   Procedure: DIAGNOSTIC URETEROSCOPY;  Surgeon: Twylla Glendia BROCKS, MD;  Location: [facility];  Service: Urology;  Laterality: Right;   URETEROSCOPY Right 03/29/2024   Procedure: URETEROSCOPY;  Surgeon: Twylla Glendia BROCKS, MD;   Location: [facility];  Service: Urology;  Laterality: Right;   Social History:  Social History   Socioeconomic History   Marital status: Widowed    Spouse name: Not on file   Number of children: Not on file   Years of education: Not on file   Highest education level: Not on file  Occupational History   Not on file  Tobacco Use   Smoking status: Every Day    Current packs/day: 0.25    Types: Cigarettes   Smokeless tobacco: Never  Vaping Use   Vaping status: Never Used  Substance and Sexual Activity   Alcohol use: No   Drug use: No   Sexual activity: Not on file  Other Topics Concern   Not on file  Social History Narrative   Not on file   Social Drivers of Health   Tobacco Use: High Risk (04/04/2024)   Patient History    Smoking Tobacco Use: Every Day    Smokeless Tobacco Use: Never    Passive Exposure: Not on file  Financial Resource Strain: Low Risk (06/28/2022)   Overall Financial Resource Strain (CARDIA)    Difficulty of Paying Living Expenses: Not very hard  Food Insecurity:  No Food Insecurity (12/22/2022)   Hunger Vital Sign    Worried About Running Out of Food in the Last Year: Never true    Ran Out of Food in the Last Year: Never true  Transportation Needs: No Transportation Needs (12/22/2022)   PRAPARE - Administrator, Civil Service (Medical): No    Lack of Transportation (Non-Medical): No  Physical Activity: Not on file  Stress: No Stress Concern Present (06/28/2022)   [employer] of Occupational Health - Occupational Stress Questionnaire    Feeling of Stress : Not at all  Social Connections: Not on file  Intimate Partner Violence: Not At Risk (12/22/2022)   Humiliation, Afraid, Rape, and Kick questionnaire    Fear of Current or Ex-Partner: No    Emotionally Abused: No    Physically Abused: No    Sexually Abused: No  Depression (PHQ2-9): Low Risk (04/04/2024)   Depression (PHQ2-9)    PHQ-2 Score: 0  Alcohol Screen: Not on file  Housing:  Unknown (02/13/2024)   Received from [facility]   Epic    Unable to Pay for Housing in the Last Year: Not on file    Number of Times Moved in the Last Year: Not on file    At any time in the past 12 months, were you homeless or living in a shelter (including now)?: No  Utilities: Not At Risk (12/22/2022)   [facility] Utilities    Threatened with loss of utilities: No  Health Literacy: Not on file   Family History:  Family History  Problem Relation Age of Onset   Asthma Mother    Heart disease Mother    Hypertension Mother    Diabetes Mother    Stroke Mother    Diabetes Sister    Diabetes Brother     Review of Systems: Constitutional: Doesn't report fevers, chills or abnormal weight loss Eyes: Doesn't report blurriness of vision Ears, nose, mouth, throat, and face: Doesn't report sore throat Respiratory: Doesn't report cough, dyspnea or wheezes Cardiovascular: Doesn't report palpitation, chest discomfort  Gastrointestinal:  Doesn't report nausea, constipation, diarrhea GU: Doesn't report incontinence Skin: Doesn't report skin rashes Neurological: Per HPI Musculoskeletal: Doesn't report joint pain Behavioral/Psych: Doesn't report anxiety  Physical Exam: Vitals:   04/06/24 1035  BP: (!) 154/82  Pulse: 97  Resp: 18    KPS: 80. General: Alert, cooperative, pleasant, in no acute distress Head: Normal EENT: No conjunctival injection or scleral icterus.  Lungs: Resp effort normal Cardiac: Regular rate Abdomen: Non-distended abdomen Skin: No rashes cyanosis or petechiae. Extremities: No clubbing or edema  Neurologic Exam: Mental Status: Awake, alert, attentive to examiner. Oriented to self and environment. Language is fluent with intact comprehension.  Cranial Nerves: Visual acuity is grossly normal. Visual fields are full. Extra-ocular movements intact. No ptosis. Face is symmetric Motor: Tone and bulk are normal. Power is full in both arms and legs.  Reflexes are symmetric, no pathologic reflexes present.  Sensory: Intact to light touch Gait: Normal.   Labs: I have reviewed the data as listed    Component Value Date/Time   NA 141 04/04/2024 0910   NA 134 (L) 01/16/2012 1722   K 3.8 04/04/2024 0910   K 3.1 (L) 01/16/2012 1722   CL 101 04/04/2024 0910   CL 97 (L) 01/16/2012 1722   CO2 28 04/04/2024 0910   CO2 27 01/16/2012 1722   GLUCOSE 187 (H) 04/04/2024 0910   GLUCOSE 198 (H) 01/16/2012 1722  BUN 14 04/04/2024 0910   BUN 13 01/16/2012 1722   CREATININE 1.16 (H) 04/04/2024 0910   CREATININE 0.99 01/16/2012 1722   CALCIUM  10.0 04/04/2024 0910   CALCIUM  9.2 01/16/2012 1722   PROT 7.0 04/04/2024 0910   PROT 8.1 01/16/2012 1722   ALBUMIN  4.0 04/04/2024 0910   ALBUMIN  2.9 (L) 01/16/2012 1722   AST 28 04/04/2024 0910   ALT 21 04/04/2024 0910   ALT 22 01/16/2012 1722   ALKPHOS 81 04/04/2024 0910   ALKPHOS 87 01/16/2012 1722   BILITOT 0.3 04/04/2024 0910   GFRNONAA 47 (L) 04/04/2024 0910   GFRNONAA 57 (L) 01/16/2012 1722   GFRAA 47 (L) 01/01/2016 0457   GFRAA >60 01/16/2012 1722   Lab Results  Component Value Date   WBC 10.8 (H) 04/04/2024   NEUTROABS 8.3 (H) 04/04/2024   HGB 10.6 (L) 04/04/2024   HCT 33.2 (L) 04/04/2024   MCV 87.8 04/04/2024   PLT 294 04/04/2024   Lipid Panel     Component Value Date/Time   CHOL 157 07/27/2023 0839   TRIG 67 07/27/2023 0839   HDL 64 07/27/2023 0839   CHOLHDL 2.5 07/27/2023 0839   VLDL 13 07/27/2023 0839   LDLCALC 80 07/27/2023 0839     Assessment/Plan 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  Based on recent workup, vascular imaging, etiology is likely large vessel disease with distal thromboembolism.  There is marked narrowing of the carotid inferior to the bifurcation.  Fortunately she remains asymptomatic.  She is not a good surgical candidate  based on her cancer history, age.  Will manage with medications at this time.  Recommended she continue ASA 81mg  daily; will defer statin pending repeat lipid panel, prior LDL was 80.  Counseled on lifestyle, diet and exercise, smoking cessation.  She is actively smoking.  She is agreeable with this plan.    We appreciate the opportunity to participate in the care of Regina Perkins.   We ask that Regina Perkins return to clinic in with recurrent neurologic symptoms, or otherwise as needed.  All questions were answered. The patient knows to call the clinic with any problems, questions or concerns. No barriers to learning were detected.  The total time spent in the encounter was 30 minutes and more than 50% was on counseling and review of test results   Arthea MARLA Manns, MD Medical Director of Neuro-Oncology [facility] 04/06/2024 10:09 AM

## 2024-04-13 ENCOUNTER — Telehealth: Payer: Self-pay | Admitting: Urology

## 2024-04-13 NOTE — Telephone Encounter (Signed)
 I contacted Regina Perkins to discuss her bladder pathology results.  Her catheter was removed 5 days postop and she states she is doing well.  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  Fragment muscle was present which was negative for cancer She also had a large 5 cm dome tumor.  Due to location and risk perforation it was only partially resected.  Pathology showed HG urothelial carcinoma.  No muscularis identified She had abnormality in the right ureter which was biopsied with pathology showing inflamed and degenerating soft tissue  The above pathology report was discussed and that her dome tumor was only partially resected.  We discussed possible recommendation of a repeat cystoscopy with further tumor debulking.  She does have metastatic lung cancer and will discuss with Dr. Babara

## 2024-04-25 ENCOUNTER — Inpatient Hospital Stay (HOSPITAL_BASED_OUTPATIENT_CLINIC_OR_DEPARTMENT_OTHER): Admitting: Oncology

## 2024-04-25 ENCOUNTER — Inpatient Hospital Stay

## 2024-04-25 ENCOUNTER — Encounter: Payer: Self-pay | Admitting: Oncology

## 2024-04-25 VITALS — BP 160/79 | HR 100 | Temp 97.4°F | Resp 18 | Wt 117.7 lb

## 2024-04-25 VITALS — BP 141/68 | HR 96

## 2024-04-25 DIAGNOSIS — C3482 Malignant neoplasm of overlapping sites of left bronchus and lung: Secondary | ICD-10-CM

## 2024-04-25 DIAGNOSIS — D649 Anemia, unspecified: Secondary | ICD-10-CM | POA: Diagnosis not present

## 2024-04-25 DIAGNOSIS — Z5112 Encounter for antineoplastic immunotherapy: Secondary | ICD-10-CM | POA: Diagnosis not present

## 2024-04-25 DIAGNOSIS — C679 Malignant neoplasm of bladder, unspecified: Secondary | ICD-10-CM | POA: Diagnosis not present

## 2024-04-25 LAB — CBC WITH DIFFERENTIAL (CANCER CENTER ONLY)
Abs Immature Granulocytes: 0.04 K/uL (ref 0.00–0.07)
Basophils Absolute: 0 K/uL (ref 0.0–0.1)
Basophils Relative: 0 %
Eosinophils Absolute: 0.2 K/uL (ref 0.0–0.5)
Eosinophils Relative: 2 %
HCT: 32.9 % — ABNORMAL LOW (ref 36.0–46.0)
Hemoglobin: 10.6 g/dL — ABNORMAL LOW (ref 12.0–15.0)
Immature Granulocytes: 0 %
Lymphocytes Relative: 16 %
Lymphs Abs: 1.5 K/uL (ref 0.7–4.0)
MCH: 27.7 pg (ref 26.0–34.0)
MCHC: 32.2 g/dL (ref 30.0–36.0)
MCV: 86.1 fL (ref 80.0–100.0)
Monocytes Absolute: 0.7 K/uL (ref 0.1–1.0)
Monocytes Relative: 7 %
Neutro Abs: 7.1 K/uL (ref 1.7–7.7)
Neutrophils Relative %: 75 %
Platelet Count: 317 K/uL (ref 150–400)
RBC: 3.82 MIL/uL — ABNORMAL LOW (ref 3.87–5.11)
RDW: 16.2 % — ABNORMAL HIGH (ref 11.5–15.5)
WBC Count: 9.5 K/uL (ref 4.0–10.5)
nRBC: 0 % (ref 0.0–0.2)

## 2024-04-25 LAB — CMP (CANCER CENTER ONLY)
ALT: 13 U/L (ref 0–44)
AST: 23 U/L (ref 15–41)
Albumin: 3.9 g/dL (ref 3.5–5.0)
Alkaline Phosphatase: 86 U/L (ref 38–126)
Anion gap: 12 (ref 5–15)
BUN: 12 mg/dL (ref 8–23)
CO2: 28 mmol/L (ref 22–32)
Calcium: 9.4 mg/dL (ref 8.9–10.3)
Chloride: 98 mmol/L (ref 98–111)
Creatinine: 0.97 mg/dL (ref 0.44–1.00)
GFR, Estimated: 58 mL/min — ABNORMAL LOW
Glucose, Bld: 178 mg/dL — ABNORMAL HIGH (ref 70–99)
Potassium: 3.4 mmol/L — ABNORMAL LOW (ref 3.5–5.1)
Sodium: 137 mmol/L (ref 135–145)
Total Bilirubin: 0.3 mg/dL (ref 0.0–1.2)
Total Protein: 7.4 g/dL (ref 6.5–8.1)

## 2024-04-25 MED ORDER — SODIUM CHLORIDE 0.9 % IV SOLN
Freq: Once | INTRAVENOUS | Status: AC
  Filled 2024-04-25: qty 250

## 2024-04-25 MED ORDER — SODIUM CHLORIDE 0.9 % IV SOLN
200.0000 mg | Freq: Once | INTRAVENOUS | Status: AC
  Administered 2024-04-25: 200 mg via INTRAVENOUS
  Filled 2024-04-25: qty 200

## 2024-04-25 NOTE — Assessment & Plan Note (Signed)
 Non-small cell lung cancer, favor squamous cell carcinoma, stage IV- TPS 90%, TMB 10, KRAS G12V MRI brain with and without contrast -  Left occipital focus of prior hemorrhage with mild linear contrast enhancement. Possible small metastasis or microhemmorhagic event.  TPS 90%, CT in Oct 2025 showed stable disease Labs are reviewed and discussed with patient.  Proceed with Keytruda 

## 2024-04-25 NOTE — Assessment & Plan Note (Signed)
 Immunotherapy treatment plan as listed above.

## 2024-04-25 NOTE — Patient Instructions (Signed)
 [facility] - A DEPT OF [facility]  Discharge Instructions: Thank you for choosing [facility] to provide your oncology and hematology care.  If you have a lab appointment with the Cancer Center, please go directly to the Cancer Center and check in at the registration area.  Wear comfortable clothing and clothing appropriate for easy access to any Portacath or PICC line.   We strive to give you quality time with your provider. You may need to reschedule your appointment if you arrive late (15 or more minutes).  Arriving late affects you and other patients whose appointments are after yours.  Also, if you miss three or more appointments without notifying the office, you may be dismissed from the clinic at the provider's discretion.      For prescription refill requests, have your pharmacy contact our office and allow 72 hours for refills to be completed.    Today you received the following chemotherapy and/or immunotherapy agents Keytruda        To help prevent nausea and vomiting after your treatment, we encourage you to take your nausea medication as directed.  BELOW ARE SYMPTOMS THAT SHOULD BE REPORTED IMMEDIATELY: *FEVER GREATER THAN 100.4 F (38 C) OR HIGHER *CHILLS OR SWEATING *NAUSEA AND VOMITING THAT IS NOT CONTROLLED WITH YOUR NAUSEA MEDICATION *UNUSUAL SHORTNESS OF BREATH *UNUSUAL BRUISING OR BLEEDING *URINARY PROBLEMS (pain or burning when urinating, or frequent urination) *BOWEL PROBLEMS (unusual diarrhea, constipation, pain near the anus) TENDERNESS IN MOUTH AND THROAT WITH OR WITHOUT PRESENCE OF ULCERS (sore throat, sores in mouth, or a toothache) UNUSUAL RASH, SWELLING OR PAIN  UNUSUAL VAGINAL DISCHARGE OR ITCHING   Items with * indicate a potential emergency and should be followed up as soon as possible or go to the Emergency Department if any problems should occur.  Please show the CHEMOTHERAPY ALERT CARD or IMMUNOTHERAPY  ALERT CARD at check-in to the Emergency Department and triage nurse.  Should you have questions after your visit or need to cancel or reschedule your appointment, please contact [facility] - A DEPT OF [facility]  [phone] and follow the prompts.  Office hours are 8:00 a.m. to 4:30 p.m. Monday - Friday. Please note that voicemails left after 4:00 p.m. may not be returned until the following business day.  We are closed weekends and major holidays. You have access to a nurse at all times for urgent questions. Please call the main number to the clinic [phone] and follow the prompts.  For any non-urgent questions, you may also contact your provider using MyChart. We now offer e-Visits for anyone 3 and older to request care online for non-urgent symptoms. For details visit [email or web address].   Also download the MyChart app! Go to the app store, search MyChart, open the app, select [facility], and log in with your MyChart username and password.

## 2024-04-27 ENCOUNTER — Encounter: Payer: Self-pay | Admitting: Oncology

## 2024-04-27 NOTE — Assessment & Plan Note (Signed)
 12/21/2022 Non-muscle invasive bladder cancer. S/p TURBT, T1 lesion, high grade.  03/29/2023 Non-muscle invasive bladder cancer. S/p TURBT Patient is s/p intravesical BCG treatments-she is off  suppressive antibiotics Discussed with urology. Oct 2025 CT showed increased bladder mass.  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 probable T1.  - large 5 cm dome tumor.  - HG papillary urothelial carcinoma muscular propria not identified.  Urology recommendation was reviewed. Possible repeat cystoscopy with further tumor debulking.

## 2024-04-27 NOTE — Assessment & Plan Note (Signed)
 Chronic anemia.  Hemoglobin is stable

## 2024-04-27 NOTE — Progress Notes (Signed)
 " Hematology/Oncology Progress note [phone] Fax:([phone]) [phone]       REASON OF VISIT Stage IV Lung squamous cell carcinoma  ASSESSMENT & PLAN:   Cancer Staging  Malignant neoplasm of lung (HCC) Staging form: Lung, AJCC 8th Edition - Clinical: Stage IVA (cT4, cN3, cM1a) - Signed by Babara Call, MD on 06/28/2022  Urothelial carcinoma of bladder [facility]) Staging form: Urinary Bladder, AJCC 8th Edition - Clinical stage from 12/21/2022: Stage I (cT1, cN0, cM0) - Signed by Babara Call, MD on 12/29/2022   Malignant neoplasm of lung (HCC) Non-small cell lung cancer, favor squamous cell carcinoma, stage IV- TPS 90%, TMB 10, KRAS G12V MRI brain with and without contrast -  Left occipital focus of prior hemorrhage with mild linear contrast enhancement. Possible small metastasis or microhemmorhagic event.  TPS 90%, CT in Oct 2025 showed stable disease Labs are reviewed and discussed with patient.  Proceed with Keytruda    Encounter for antineoplastic immunotherapy Immunotherapy treatment plan as listed above.  Normocytic anemia Chronic anemia.  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  03/29/2023 Non-muscle invasive bladder cancer. S/p TURBT Patient is s/p intravesical BCG treatments-she is off  suppressive antibiotics Discussed with urology. Oct 2025 CT showed increased bladder mass.  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 probable T1.  - large 5 cm dome tumor.  - HG papillary urothelial carcinoma muscular propria not identified.  Urology recommendation was reviewed. Possible repeat cystoscopy with further tumor debulking.    No orders of the defined types were placed in this encounter.  Follow up in 3 weeks.   All questions were answered. The patient knows to call the  clinic with any problems, questions or concerns.  Call Babara, MD, PhD [facility] Hematology Oncology 04/25/2024       HISTORY OF PRESENTING ILLNESS:  Regina Perkins 84 y.o. 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 I have reviewed her chart and materials related to her cancer extensively and collaborated history with the patient. Summary of oncologic history is as follows: Oncology History  Malignant neoplasm of lung (HCC)  05/24/2022 Imaging   CT chest angiogram showed 1. No evidence for pulmonary embolism. 2. Left upper lobe/prevascular mass worrisome for neoplasm. 3. Mediastinal and hilar lymphadenopathy. 4. Multiple pulmonary nodules measuring up to 11 mm worrisome for metastatic disease. 5. Small left pleural effusion. 6. Patchy ground-glass and airspace opacities in the left upper lobe worrisome for infection. 7. Moderate-sized pericardial effusion. 8. Cholelithiasis. 9. Nonobstructing left renal calculus   05/25/2022 Imaging   CT abdomen pelvis wo contrast 1. No acute findings or explanation for the patient's symptoms. 2. No evidence of primary malignancy or metastatic disease within the abdomen or pelvis on noncontrast imaging.  3. Cholelithiasis without evidence of cholecystitis or biliary dilatation. 4. Nonobstructing left renal calculus. 5. Moderate stool throughout the colon suggesting constipation. 6.  Aortic Atherosclerosis    05/26/2022 Initial Diagnosis   Malignant neoplasm of lung - TPS 90%, TMB 10, KRAS G12V  -05/22/2022 in the emergency room, she was found to have hypoxia with oxygen levels of 86% on room air, improved to 94 with 2 L of oxygen.  D-dimer was elevated at 1.29, troponin negative.  Lower extremity Doppler was negative for DVT.  VQ scan high  probability PE (Large segmental perfusion defect of the left upper lobe without corresponding radiographic  abnormality. Additional bilateral wedge-shaped subsegmental branch perfusion defects .  Patient was admitted and started on heparin  Echocardiogram showed no RV strain.  Moderate pericardial effusion. Over her hospitalization, she continues to have shortness of breath with minimal exertion. 05/24/2022, CT chest angiogram showed left lung mass with bilateral lung nodules.   06/16/2022 Imaging   PET scan  1. 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 No evidence of metastatic disease in the abdomen or pelvis. 2. Moderate pericardial effusion. 3. Tiny left pleural effusion. 4. Left renal stone. 5. Aortic atherosclerosis (ICD10-I70.0). Coronary artery calcification. 6. 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    06/28/2022 Cancer Staging   Staging form: Lung, AJCC 8th Edition - Clinical: Stage IVA (cT4, cN3, cM1a) - Signed by Babara Call, MD on 06/28/2022   07/14/2022 -  Chemotherapy   Patient is on Treatment Plan : LUNG NSCLC Pembrolizumab  (200) q21d     09/29/2022 Imaging   CT chest abdomen pelvis w contrast showed Bilateral pulmonary nodules, some of which are waxing/waning, but favoring multifocal lung carcinoma/metastases.   Improving thoracic nodal metastases, as above.  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 Consider cystoscopy as clinically warranted.  Aortic Atherosclerosis (ICD10-I70.0) and Emphysema (ICD10-J43.9)   01/06/2023 Imaging   CT chest abdomen pelvis w contrast showed 1. Interval decrease in size of  multiple spiculated and subsolid pulmonary nodules, consistent with treatment response. 2. No significant change in matted post treatment prevascular and left superior mediastinal soft tissue. 3. Diminished size of an endoluminal bladder mass seen on prior examination, soft tissue in this vicinity now measuring no greater than 2.1 x 1.4 cm, previously at least 4.7 x 2.1 cm. Presumably this has been at least partially resected. 4. No evidence of lymphadenopathy or metastatic disease in the abdomen or pelvis. 5. Cholelithiasis. 6. Nonobstructive left nephrolithiasis. 7. Coronary artery disease.   01/10/2023 Procedure   Medi port placed by IR   05/11/2023 Imaging   CT chest abdomen pelvis w contrast showed 1. Unchanged matted, treated prevascular and left superior mediastinal soft tissue. 2. Multiple bilateral spiculated and ground-glass pulmonary nodules not significantly changed. 3. No residual mass of the posterior right aspect of the urinary bladder. Mild residual thickening in this vicinity. Interval placement of right-sided double-J ureteral stent catheter. 4. No evidence of lymphadenopathy or metastatic disease in the abdomen or pelvis. 5. Emphysema and diffuse bilateral bronchial wall thickening. 6. Coronary artery disease.   Aortic Atherosclerosis (ICD10-I70.0) and Emphysema (ICD10-J43.9).    02/15/2024 Imaging   CT chest abdomen pelvis wo contrast showed  1. Unchanged bilateral pulmonary nodules and consolidative opacity of the paramedian left upper lobe. 2. No significant change in matted soft tissue or treated lymph nodes in the prevascular station. No discretely enlarged mediastinal, hilar, or axillary lymph nodes. 3. Endoluminal mass of the right aspect of the urinary bladder, better seen on today's examination and appears to have enlarged at 3.1 x 1.7 cm, difficult to confidently measure on prior although approximately 1.9 x 1.2 cm. This is most consistent with  a primary bladder malignancy.  4. Right-sided double-J ureteral stent catheter with formed pigtails in the right renal pelvis and urinary bladder. Unchanged mild right hydronephrosis. 5. Cholelithiasis. 6. Coronary artery disease.     Urothelial carcinoma of bladder (HCC)  10/06/2022 Initial Diagnosis   Urothelial carcinoma of bladder S/p TURBT  1. 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 Bladder, biopsy, Base of tumor HIGH GRADE PAPILLARY UROTHELIAL CARCINOMA SUBMUCOSA AND MUSCULARIS PROPRIA IS PRESENT AND NOT INVOLVED BY CARCINOMA  Postprocedure, patient developed gross hematuria and was admitted to the hospital.  She received PRBC transfusion.  Hemoglobin improved and she was discharged.    12/21/2022 Cancer Staging   Staging form: Urinary Bladder, AJCC 8th Edition - Clinical stage from 12/21/2022: Stage I (cT1, cN0, cM0) - Signed by Babara Call, MD on 12/29/2022 WHO/ISUP grade (low/high): High Grade Histologic grading system: 2 grade system   02/15/2024 Imaging   CT chest abdomen pelvis wo contrast showed  1. Unchanged bilateral pulmonary nodules and consolidative opacity of the paramedian left upper lobe. 2. No significant change in matted soft tissue or treated lymph nodes in the prevascular station. No discretely enlarged mediastinal, hilar, or axillary lymph nodes. 3. Endoluminal mass of the right aspect of the urinary bladder, better seen on today's examination and appears to have enlarged at 3.1 x 1.7 cm, difficult to confidently measure on prior although approximately 1.9 x 1.2 cm. This is most consistent with a primary bladder malignancy. 4. Right-sided double-J ureteral stent catheter with formed pigtails in the right renal pelvis and urinary bladder. Unchanged mild  right hydronephrosis. 5. Cholelithiasis. 6. Coronary artery disease.       INTERVAL HISTORY Lelah L Marsan is a 84 y.o. female who has above history reviewed by me today presents for follow up visit for Lung squamous cell carcinoma She tolerates keytuda well. Denies skin rash, diarrhea.  Patient has no new complaints.  Denies chest pain, chest palpitation. She denies headache, focal weakness or slurred speech.  S/p cystoscopy and bladder tumor resection.    MEDICAL HISTORY:  Past Medical History:  Diagnosis Date   Atherosclerosis of native arteries of extremity with intermittent claudication    Bladder mass    Chronic kidney disease, stage 3a (HCC)    COPD (chronic obstructive pulmonary disease) (HCC)    Diabetes mellitus without complication (HCC)    Gastrointestinal hemorrhage    GERD (gastroesophageal reflux disease)    Glaucoma    History of duodenal ulcer    HTN (hypertension)    Hypertension    Hypokalemia    Hypomagnesemia    Leukocytosis    Lung mass    Malignant neoplasm of lung (HCC) 06/28/2022   a.) stage IVA (cT4, cN3, cM1a) favor squamous cell carcinoma --> TPS 90%, TMB 10, KRAS G12V   Malnutrition    Normocytic anemia    Pericardial effusion 05/2022   Pulmonary embolism (HCC)    Sepsis (HCC)    Tobacco abuse    Type II diabetes mellitus with renal manifestations (HCC)    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   a.) stage I (cT1, cN0, cM0)    SURGICAL HISTORY: Past Surgical History:  Procedure Laterality Date   ABDOMINAL HYSTERECTOMY     BACK SURGERY     1988 and 1998   CYSTOSCOPY W/ RETROGRADES Bilateral 12/21/2022   Procedure: CYSTOSCOPY WITH RETROGRADE PYELOGRAM;  Surgeon: Twylla Glendia BROCKS, MD;  Location: [facility];  Service: Urology;  Laterality: Bilateral;   CYSTOSCOPY W/ URETERAL STENT PLACEMENT Right 03/29/2024   Procedure: CYSTOSCOPY, FLEXIBLE, WITH STENT REPLACEMENT;  Surgeon: Twylla Glendia BROCKS, MD;  Location: [facility];  Service:  Urology;  Laterality: Right;   CYSTOSCOPY W/ URETERAL STENT REMOVAL Right 03/29/2024   Procedure: REMOVAL, STENT, URETER, CYSTOSCOPIC;  Surgeon: Twylla Glendia BROCKS, MD;  Location: [facility];  Service: Urology;  Laterality: Right;   CYSTOSCOPY WITH BIOPSY N/A 03/29/2023   Procedure: CYSTOSCOPY WITH BLADDER BIOPSY;  Surgeon: Twylla Glendia BROCKS, MD;  Location: [facility];  Service: Urology;  Laterality: N/A;   CYSTOSCOPY WITH STENT PLACEMENT Right 03/29/2023   Procedure: CYSTOSCOPY WITH STENT PLACEMENT;  Surgeon: Twylla Glendia BROCKS, MD;  Location: [facility];  Service: Urology;  Laterality: Right;   ESOPHAGOGASTRODUODENOSCOPY (EGD) WITH PROPOFOL  N/A 07/29/2022   Procedure: ESOPHAGOGASTRODUODENOSCOPY (EGD) WITH PROPOFOL ;  Surgeon: Therisa Bi, MD;  Location: [facility];  Service: Gastroenterology;  Laterality: N/A;   ESOPHAGOGASTRODUODENOSCOPY (EGD) WITH PROPOFOL  N/A 01/21/2023   Procedure: ESOPHAGOGASTRODUODENOSCOPY (EGD) WITH PROPOFOL ;  Surgeon: Therisa Bi, MD;  Location: [facility] ENDOSCOPY;  Service: Gastroenterology;  Laterality: N/A;   HOLMIUM LASER APPLICATION Right 03/29/2023   Procedure: HOLMIUM LASER ABLATION;  Surgeon: Twylla Glendia BROCKS, MD;  Location: [facility];  Service: Urology;  Laterality: Right;   IR IMAGING GUIDED PORT INSERTION  01/10/2023   TRANSURETHRAL RESECTION OF BLADDER TUMOR N/A 12/21/2022   Procedure: TRANSURETHRAL RESECTION OF BLADDER TUMOR (TURBT);  Surgeon: Twylla Glendia BROCKS, MD;  Location: [facility];  Service: Urology;  Laterality: N/A;   TRANSURETHRAL RESECTION OF BLADDER TUMOR N/A 03/29/2023   Procedure: TRANSURETHRAL RESECTION OF BLADDER TUMOR (TURBT);  Surgeon: Twylla Glendia BROCKS, MD;  Location: [facility];  Service: Urology;  Laterality: N/A;   TRANSURETHRAL RESECTION OF BLADDER TUMOR N/A 03/29/2024   Procedure: TURBT (TRANSURETHRAL RESECTION OF BLADDER TUMOR);  Surgeon: Twylla Glendia BROCKS, MD;  Location: [facility];  Service: Urology;  Laterality: N/A;   URETERAL BIOPSY Right 03/29/2024    Procedure: BIOPSY, URETER;  Surgeon: Twylla Glendia BROCKS, MD;  Location: [facility];  Service: Urology;  Laterality: Right;  /Fulguration   URETEROSCOPY Right 03/29/2023   Procedure: DIAGNOSTIC URETEROSCOPY;  Surgeon: Twylla Glendia BROCKS, MD;  Location: [facility];  Service: Urology;  Laterality: Right;   URETEROSCOPY Right 03/29/2024   Procedure: URETEROSCOPY;  Surgeon: Twylla Glendia BROCKS, MD;  Location: [facility];  Service: Urology;  Laterality: Right;    SOCIAL HISTORY: Social History   Socioeconomic History   Marital status: Widowed    Spouse name: Not on file   Number of children: Not on file   Years of education: Not on file   Highest education level: Not on file  Occupational History   Not on file  Tobacco Use   Smoking status: Every Day    Current packs/day: 0.25    Types: Cigarettes   Smokeless tobacco: Never  Vaping Use   Vaping status: Never Used  Substance and Sexual Activity   Alcohol use: No   Drug use: No   Sexual activity: Not on file  Other Topics Concern   Not on file  Social History Narrative   Not on file   Social Drivers of Health   Tobacco Use: High Risk (04/25/2024)   Patient History    Smoking Tobacco Use: Every Day    Smokeless Tobacco Use: Never    Passive Exposure: Not on file  Financial Resource Strain: Low Risk (06/28/2022)  Overall Financial Resource Strain (CARDIA)    Difficulty of Paying Living Expenses: Not very hard  Food Insecurity: No Food Insecurity (12/22/2022)   Hunger Vital Sign    Worried About Running Out of Food in the Last Year: Never true    Ran Out of Food in the Last Year: Never true  Transportation Needs: No Transportation Needs (12/22/2022)   PRAPARE - Administrator, Civil Service (Medical): No    Lack of Transportation (Non-Medical): No  Physical Activity: Not on file  Stress: No Stress Concern Present (06/28/2022)   [employer] of Occupational Health - Occupational Stress Questionnaire    Feeling of Stress : Not  at all  Social Connections: Not on file  Intimate Partner Violence: Not At Risk (12/22/2022)   Humiliation, Afraid, Rape, and Kick questionnaire    Fear of Current or Ex-Partner: No    Emotionally Abused: No    Physically Abused: No    Sexually Abused: No  Depression (PHQ2-9): Low Risk (04/25/2024)   Depression (PHQ2-9)    PHQ-2 Score: 0  Alcohol Screen: Not on file  Housing: Unknown (02/13/2024)   Received from [facility]   Epic    Unable to Pay for Housing in the Last Year: Not on file    Number of Times Moved in the Last Year: Not on file    At any time in the past 12 months, were you homeless or living in a shelter (including now)?: No  Utilities: Not At Risk (12/22/2022)   [facility] Utilities    Threatened with loss of utilities: No  Health Literacy: Not on file    FAMILY HISTORY: Family History  Problem Relation Age of Onset   Asthma Mother    Heart disease Mother    Hypertension Mother    Diabetes Mother    Stroke Mother    Diabetes Sister    Diabetes Brother     ALLERGIES:  is allergic to ivp dye [iodinated contrast media] and shellfish allergy.  MEDICATIONS:  Current Outpatient Medications  Medication Sig Dispense Refill   albuterol  (VENTOLIN  HFA) 108 (90 Base) MCG/ACT inhaler Inhale 2 puffs into the lungs every 4 (four) hours as needed for wheezing or shortness of breath.     amLODipine  (NORVASC ) 10 MG tablet Take 10 mg by mouth every morning.     aspirin  EC 81 MG tablet Take 1 tablet (81 mg total) by mouth daily. Swallow whole. 30 tablet 12   cyanocobalamin  (VITAMIN B12) 1000 MCG tablet Take 1 tablet (1,000 mcg total) by mouth daily. 90 tablet 0   fluticasone -salmeterol (ADVAIR) 250-50 MCG/ACT AEPB Inhale 1 puff into the lungs.     lidocaine -prilocaine  (EMLA ) cream Apply 1 Application topically as needed. Apply to port and cover with saran wrap 1-2 hours prior to port access 30 g 1   lisinopril  (ZESTRIL ) 40 MG tablet Take 40 mg by mouth every  morning.     loratadine  (CLARITIN ) 10 MG tablet Take 10 mg by mouth daily.     meloxicam (MOBIC) 7.5 MG tablet Take 7.5-15 mg by mouth daily.     OXYGEN Inhale 2 L into the lungs as directed.     No current facility-administered medications for this visit.    Review of Systems  Constitutional:  Negative for appetite change, chills, fatigue and fever.  HENT:   Negative for hearing loss and voice change.   Eyes:  Negative for eye problems.  Respiratory:  Negative for chest tightness, cough  and shortness of breath.   Cardiovascular:  Negative for chest pain.  Gastrointestinal:  Negative for abdominal distention, abdominal pain and blood in stool.  Endocrine: Negative for hot flashes.  Genitourinary:  Negative for difficulty urinating and frequency.   Musculoskeletal:  Negative for arthralgias.  Skin:  Negative for itching and rash.  Neurological:  Negative for extremity weakness.  Hematological:  Negative for adenopathy.  Psychiatric/Behavioral:  Negative for confusion.      PHYSICAL EXAMINATION: ECOG PERFORMANCE STATUS: 2 - Symptomatic, <50% confined to bed  Vitals:   04/25/24 0840 04/25/24 0850  BP: (!) 175/92 (!) 160/79  Pulse: 100   Resp: 18   Temp: (!) 97.4 F (36.3 C)   SpO2: 100%     Filed Weights   04/25/24 0840  Weight: 117 lb 11.2 oz (53.4 kg)      Physical Exam Constitutional:      General: She is not in acute distress.    Appearance: She is not diaphoretic.  HENT:     Head: Normocephalic.  Eyes:     General: No scleral icterus.    Pupils: Pupils are equal, round, and reactive to light.  Cardiovascular:     Rate and Rhythm: Tachycardia present.     Heart sounds: No murmur heard. Pulmonary:     Effort: Pulmonary effort is normal. No respiratory distress.     Comments: Decreased breath sounds bilaterally Abdominal:     General: There is no distension.  Musculoskeletal:        General: Normal range of motion.     Cervical back: Normal range of  motion.  Skin:    Findings: No erythema.  Neurological:     Mental Status: She is alert and oriented to person, place, and time. Mental status is at baseline.     Cranial Nerves: No cranial nerve deficit.     Motor: No abnormal muscle tone.  Psychiatric:        Mood and Affect: Affect normal.      LABORATORY DATA:  I have reviewed the data as listed    Latest Ref Rng & Units 04/25/2024    8:19 AM 04/04/2024    9:10 AM 03/14/2024    8:37 AM  CBC  WBC 4.0 - 10.5 K/uL 9.5  10.8  7.4   Hemoglobin 12.0 - 15.0 g/dL 89.3  89.3  88.4   Hematocrit 36.0 - 46.0 % 32.9  33.2  35.9   Platelets 150 - 400 K/uL 317  294  241       Latest Ref Rng & Units 04/25/2024    8:19 AM 04/04/2024    9:10 AM 03/14/2024    8:37 AM  CMP  Glucose 70 - 99 mg/dL 821  812  865   BUN 8 - 23 mg/dL 12  14  22    Creatinine 0.44 - 1.00 mg/dL 9.02  8.83  8.81   Sodium 135 - 145 mmol/L 137  141  138   Potassium 3.5 - 5.1 mmol/L 3.4  3.8  3.7   Chloride 98 - 111 mmol/L 98  101  103   CO2 22 - 32 mmol/L 28  28  27    Calcium  8.9 - 10.3 mg/dL 9.4  89.9  9.2   Total Protein 6.5 - 8.1 g/dL 7.4  7.0  7.3   Total Bilirubin 0.0 - 1.2 mg/dL 0.3  0.3  0.5   Alkaline Phos 38 - 126 U/L 86  81  69   AST 15 -  41 U/L 23  28  25    ALT 0 - 44 U/L 13  21  16       RADIOGRAPHIC STUDIES: I have personally reviewed the radiological images as listed and agreed with the findings in the report. DG OR UROLOGY CYSTO IMAGE ([facility] ONLY) Result Date: 03/29/2024 There is no interpretation for this exam.  This order is for images obtained during a surgical procedure.  Please See Surgeries Tab for more information regarding the procedure.        "

## 2024-05-14 ENCOUNTER — Encounter: Payer: Self-pay | Admitting: Oncology

## 2024-05-16 ENCOUNTER — Telehealth: Payer: Self-pay | Admitting: Oncology

## 2024-05-16 ENCOUNTER — Encounter: Payer: Self-pay | Admitting: Oncology

## 2024-05-16 ENCOUNTER — Inpatient Hospital Stay (HOSPITAL_BASED_OUTPATIENT_CLINIC_OR_DEPARTMENT_OTHER): Admitting: Oncology

## 2024-05-16 ENCOUNTER — Inpatient Hospital Stay: Attending: Oncology

## 2024-05-16 ENCOUNTER — Inpatient Hospital Stay

## 2024-05-16 VITALS — BP 150/76

## 2024-05-16 VITALS — BP 160/89 | HR 98 | Temp 98.4°F | Resp 16 | Wt 119.0 lb

## 2024-05-16 DIAGNOSIS — D649 Anemia, unspecified: Secondary | ICD-10-CM | POA: Insufficient documentation

## 2024-05-16 DIAGNOSIS — C3482 Malignant neoplasm of overlapping sites of left bronchus and lung: Secondary | ICD-10-CM

## 2024-05-16 DIAGNOSIS — C679 Malignant neoplasm of bladder, unspecified: Secondary | ICD-10-CM | POA: Insufficient documentation

## 2024-05-16 DIAGNOSIS — C3412 Malignant neoplasm of upper lobe, left bronchus or lung: Secondary | ICD-10-CM | POA: Insufficient documentation

## 2024-05-16 DIAGNOSIS — Z5112 Encounter for antineoplastic immunotherapy: Secondary | ICD-10-CM | POA: Diagnosis not present

## 2024-05-16 DIAGNOSIS — C771 Secondary and unspecified malignant neoplasm of intrathoracic lymph nodes: Secondary | ICD-10-CM | POA: Insufficient documentation

## 2024-05-16 DIAGNOSIS — C787 Secondary malignant neoplasm of liver and intrahepatic bile duct: Secondary | ICD-10-CM | POA: Diagnosis present

## 2024-05-16 LAB — CBC WITH DIFFERENTIAL (CANCER CENTER ONLY)
Abs Immature Granulocytes: 0.03 K/uL (ref 0.00–0.07)
Basophils Absolute: 0 K/uL (ref 0.0–0.1)
Basophils Relative: 0 %
Eosinophils Absolute: 0.2 K/uL (ref 0.0–0.5)
Eosinophils Relative: 2 %
HCT: 32.7 % — ABNORMAL LOW (ref 36.0–46.0)
Hemoglobin: 10.3 g/dL — ABNORMAL LOW (ref 12.0–15.0)
Immature Granulocytes: 0 %
Lymphocytes Relative: 19 %
Lymphs Abs: 1.4 K/uL (ref 0.7–4.0)
MCH: 27.6 pg (ref 26.0–34.0)
MCHC: 31.5 g/dL (ref 30.0–36.0)
MCV: 87.7 fL (ref 80.0–100.0)
Monocytes Absolute: 0.4 K/uL (ref 0.1–1.0)
Monocytes Relative: 5 %
Neutro Abs: 5.3 K/uL (ref 1.7–7.7)
Neutrophils Relative %: 74 %
Platelet Count: 405 K/uL — ABNORMAL HIGH (ref 150–400)
RBC: 3.73 MIL/uL — ABNORMAL LOW (ref 3.87–5.11)
RDW: 17.2 % — ABNORMAL HIGH (ref 11.5–15.5)
WBC Count: 7.3 K/uL (ref 4.0–10.5)
nRBC: 0 % (ref 0.0–0.2)

## 2024-05-16 LAB — CMP (CANCER CENTER ONLY)
ALT: 13 U/L (ref 0–44)
AST: 22 U/L (ref 15–41)
Albumin: 4.2 g/dL (ref 3.5–5.0)
Alkaline Phosphatase: 82 U/L (ref 38–126)
Anion gap: 12 (ref 5–15)
BUN: 17 mg/dL (ref 8–23)
CO2: 28 mmol/L (ref 22–32)
Calcium: 9.8 mg/dL (ref 8.9–10.3)
Chloride: 101 mmol/L (ref 98–111)
Creatinine: 1 mg/dL (ref 0.44–1.00)
GFR, Estimated: 56 mL/min — ABNORMAL LOW
Glucose, Bld: 102 mg/dL — ABNORMAL HIGH (ref 70–99)
Potassium: 3.5 mmol/L (ref 3.5–5.1)
Sodium: 141 mmol/L (ref 135–145)
Total Bilirubin: 0.2 mg/dL (ref 0.0–1.2)
Total Protein: 7.5 g/dL (ref 6.5–8.1)

## 2024-05-16 LAB — TSH: TSH: 1.25 u[IU]/mL (ref 0.350–4.500)

## 2024-05-16 MED ORDER — SODIUM CHLORIDE 0.9 % IV SOLN
200.0000 mg | Freq: Once | INTRAVENOUS | Status: AC
  Administered 2024-05-16: 200 mg via INTRAVENOUS
  Filled 2024-05-16: qty 8

## 2024-05-16 MED ORDER — SODIUM CHLORIDE 0.9 % IV SOLN
Freq: Once | INTRAVENOUS | Status: AC
  Filled 2024-05-16: qty 250

## 2024-05-16 NOTE — Assessment & Plan Note (Signed)
 Non-small cell lung cancer, favor squamous cell carcinoma, stage IV- TPS 90%, TMB 10, KRAS G12V MRI brain with and without contrast -  Left occipital focus of prior hemorrhage with mild linear contrast enhancement. Possible small metastasis or microhemmorhagic event.  TPS 90%, CT in Oct 2025 showed stable disease Labs are reviewed and discussed with patient.  Proceed with Keytruda 

## 2024-05-16 NOTE — Progress Notes (Signed)
Pt in for follow up and treatment, denies any concerns today. 

## 2024-05-16 NOTE — Telephone Encounter (Signed)
 Called pt to make aware of appt info for next appt - left vm - sent out updated AVS - LH

## 2024-05-16 NOTE — Assessment & Plan Note (Signed)
 Immunotherapy treatment plan as listed above.

## 2024-05-16 NOTE — Assessment & Plan Note (Addendum)
 12/21/2022 Non-muscle invasive bladder cancer. S/p TURBT, T1 lesion, high grade.  03/29/2023 Non-muscle invasive bladder cancer. S/p TURBT Patient is s/p intravesical BCG treatments-she is off  suppressive antibiotics Discussed with urology. Oct 2025 CT showed increased bladder mass.  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 probable T1.  - large 5 cm dome tumor.  - HG papillary urothelial carcinoma muscular propria not identified.  Urology recommendation was reviewed. Possible repeat cystoscopy with further tumor debulking. Patient expresses desire not to proceed with any treatments.  She understands that bladder tumor may continue to progress and cause complication.

## 2024-05-16 NOTE — Assessment & Plan Note (Signed)
 Chronic anemia.  Hemoglobin is stable

## 2024-05-16 NOTE — Progress Notes (Signed)
 " Hematology/Oncology Progress note [phone] [phone]       REASON OF VISIT Stage IV Lung squamous cell carcinoma  ASSESSMENT & PLAN:   Cancer Staging  Malignant neoplasm of lung (HCC) Staging form: Lung, AJCC 8th Edition - Clinical: Stage IVA (cT4, cN3, cM1a) - Signed by Babara Call, MD on 06/28/2022  Urothelial carcinoma of bladder [facility]) Staging form: Urinary Bladder, AJCC 8th Edition - Clinical stage from 12/21/2022: Stage I (cT1, cN0, cM0) - Signed by Babara Call, MD on 12/29/2022   Malignant neoplasm of lung (HCC) Non-small cell lung cancer, favor squamous cell carcinoma, stage IV- TPS 90%, TMB 10, KRAS G12V MRI brain with and without contrast -  Left occipital focus of prior hemorrhage with mild linear contrast enhancement. Possible small metastasis or microhemmorhagic event.  TPS 90%, CT in Oct 2025 showed stable disease Labs are reviewed and discussed with patient.  Proceed with Keytruda    Encounter for antineoplastic immunotherapy Immunotherapy treatment plan as listed above.  Normocytic anemia Chronic anemia.  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  03/29/2023 Non-muscle invasive bladder cancer. S/p TURBT Patient is s/p intravesical BCG treatments-she is off  suppressive antibiotics Discussed with urology. Oct 2025 CT showed increased bladder mass.  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 probable T1.  - large 5 cm dome tumor.  - HG papillary urothelial carcinoma muscular propria not identified.  Urology recommendation was reviewed. Possible repeat cystoscopy with further tumor debulking. Patient expresses desire not to proceed with any treatments.  She understands that bladder tumor may continue to progress and cause  complication.    Orders Placed This Encounter  Procedures   CBC with Differential (Cancer Center Only)    Standing Status:   Future    Expected Date:   06/06/2024    Expiration Date:   06/06/2025   CMP (Cancer Center only)    Standing Status:   Future    Expected Date:   06/06/2024    Expiration Date:   06/06/2025   CBC with Differential (Cancer Center Only)    Standing Status:   Future    Expected Date:   06/27/2024    Expiration Date:   06/27/2025   CMP (Cancer Center only)    Standing Status:   Future    Expected Date:   06/27/2024    Expiration Date:   06/27/2025   Follow up in 3 weeks.   All questions were answered. The patient knows to call the clinic with any problems, questions or concerns.  Call Babara, MD, PhD [facility] Hematology Oncology 05/16/2024       HISTORY OF PRESENTING ILLNESS:  Regina Perkins 84 y.o. 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 I have reviewed her chart and materials related to her cancer extensively and collaborated history with the patient. Summary of oncologic history is as follows: Oncology History  Malignant neoplasm of lung (HCC)  05/24/2022 Imaging   CT chest angiogram showed 1. No evidence for pulmonary embolism. 2. Left upper lobe/prevascular mass worrisome for neoplasm. 3. Mediastinal and hilar lymphadenopathy. 4. Multiple pulmonary nodules measuring up to 11 mm worrisome for metastatic disease. 5. Small left pleural effusion. 6. Patchy ground-glass and airspace opacities in the left upper lobe worrisome for infection. 7.  Moderate-sized pericardial effusion. 8. Cholelithiasis. 9. Nonobstructing left renal calculus   05/25/2022 Imaging   CT abdomen pelvis wo contrast 1. No acute findings or explanation for the patient's symptoms. 2. No evidence of primary malignancy or metastatic disease within the abdomen or pelvis on noncontrast  imaging.  3. Cholelithiasis without evidence of cholecystitis or biliary dilatation. 4. Nonobstructing left renal calculus. 5. Moderate stool throughout the colon suggesting constipation. 6.  Aortic Atherosclerosis    05/26/2022 Initial Diagnosis   Malignant neoplasm of lung - TPS 90%, TMB 10, KRAS G12V  -05/22/2022 in the emergency room, she was found to have hypoxia with oxygen levels of 86% on room air, improved to 94 with 2 L of oxygen.  D-dimer was elevated at 1.29, troponin negative.  Lower extremity Doppler was negative for DVT.  VQ scan high probability PE (Large segmental perfusion defect of the left upper lobe without corresponding radiographic abnormality. Additional bilateral wedge-shaped subsegmental branch perfusion defects .  Patient was admitted and started on heparin  Echocardiogram showed no RV strain.  Moderate pericardial effusion. Over her hospitalization, she continues to have shortness of breath with minimal exertion. 05/24/2022, CT chest angiogram showed left lung mass with bilateral lung nodules.   06/16/2022 Imaging   PET scan  1. 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 No evidence of metastatic disease in the abdomen or pelvis. 2. Moderate pericardial effusion. 3. Tiny left pleural effusion. 4. Left renal stone. 5. Aortic atherosclerosis (ICD10-I70.0). Coronary artery calcification. 6. 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    06/28/2022 Cancer Staging   Staging form: Lung, AJCC 8th  Edition - Clinical: Stage IVA (cT4, cN3, cM1a) - Signed by Babara Call, MD on 06/28/2022   07/14/2022 -  Chemotherapy   Patient is on Treatment Plan : LUNG NSCLC Pembrolizumab  (200) q21d     09/29/2022 Imaging   CT chest abdomen pelvis w contrast showed Bilateral pulmonary nodules, some of which are waxing/waning, but favoring multifocal lung carcinoma/metastases.   Improving thoracic nodal metastases, as above.  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 Consider cystoscopy as clinically warranted.  Aortic Atherosclerosis (ICD10-I70.0) and Emphysema (ICD10-J43.9)   01/06/2023 Imaging   CT chest abdomen pelvis w contrast showed 1. Interval decrease in size of multiple spiculated and subsolid pulmonary nodules, consistent with treatment response. 2. No significant change in matted post treatment prevascular and left superior mediastinal soft tissue. 3. Diminished size of an endoluminal bladder mass seen on prior examination, soft tissue in this vicinity now measuring no greater than 2.1 x 1.4 cm, previously at least 4.7 x 2.1 cm. Presumably this has been at least partially resected. 4. No evidence of lymphadenopathy or metastatic disease in the abdomen or pelvis. 5. Cholelithiasis. 6. Nonobstructive left nephrolithiasis. 7. Coronary artery disease.   01/10/2023 Procedure   Medi port placed by IR   05/11/2023 Imaging   CT chest abdomen pelvis w contrast showed 1. Unchanged matted, treated prevascular and left superior mediastinal soft tissue. 2. Multiple bilateral spiculated and ground-glass pulmonary nodules not significantly changed. 3. No residual mass of the posterior right aspect of the urinary bladder. Mild residual thickening in this vicinity. Interval  placement of right-sided double-J ureteral stent catheter. 4. No evidence of lymphadenopathy or metastatic disease in the abdomen or pelvis. 5. Emphysema and diffuse bilateral bronchial wall thickening. 6. Coronary artery disease.    Aortic Atherosclerosis (ICD10-I70.0) and Emphysema (ICD10-J43.9).    02/15/2024 Imaging   CT chest abdomen pelvis wo contrast showed  1. Unchanged bilateral pulmonary nodules and consolidative opacity of the paramedian left upper lobe. 2. No significant change in matted soft tissue or treated lymph nodes in the prevascular station. No discretely enlarged mediastinal, hilar, or axillary lymph nodes. 3. Endoluminal mass of the right aspect of the urinary bladder, better seen on today's examination and appears to have enlarged at 3.1 x 1.7 cm, difficult to confidently measure on prior although approximately 1.9 x 1.2 cm. This is most consistent with a primary bladder malignancy. 4. Right-sided double-J ureteral stent catheter with formed pigtails in the right renal pelvis and urinary bladder. Unchanged mild right hydronephrosis. 5. Cholelithiasis. 6. Coronary artery disease.     Urothelial carcinoma of bladder (HCC)  10/06/2022 Initial Diagnosis   Urothelial carcinoma of bladder S/p TURBT  1. 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 Bladder, biopsy, Base of tumor HIGH GRADE PAPILLARY UROTHELIAL CARCINOMA SUBMUCOSA AND MUSCULARIS PROPRIA IS PRESENT AND NOT INVOLVED BY CARCINOMA  Postprocedure, patient developed gross hematuria and was admitted to the hospital.  She received PRBC transfusion.  Hemoglobin improved and she was discharged.    12/21/2022 Cancer Staging   Staging form: Urinary Bladder, AJCC 8th Edition - Clinical stage from 12/21/2022: Stage I (cT1, cN0, cM0) - Signed by Babara Call, MD on 12/29/2022 WHO/ISUP grade (low/high): High Grade Histologic grading system: 2 grade system   02/15/2024 Imaging   CT chest abdomen pelvis wo contrast showed  1. Unchanged bilateral  pulmonary nodules and consolidative opacity of the paramedian left upper lobe. 2. No significant change in matted soft tissue or treated lymph nodes in the prevascular station. No discretely enlarged mediastinal, hilar, or axillary lymph nodes. 3. Endoluminal mass of the right aspect of the urinary bladder, better seen on today's examination and appears to have enlarged at 3.1 x 1.7 cm, difficult to confidently measure on prior although approximately 1.9 x 1.2 cm. This is most consistent with a primary bladder malignancy. 4. Right-sided double-J ureteral stent catheter with formed pigtails in the right renal pelvis and urinary bladder. Unchanged mild right hydronephrosis. 5. Cholelithiasis. 6. Coronary artery disease.       INTERVAL HISTORY Kiira L Blecher is a 84 y.o. female who has above history reviewed by me today presents for follow up visit for Lung squamous cell carcinoma She tolerates keytuda well. Denies skin rash, diarrhea.  Patient has no new complaints.  Denies chest pain, chest palpitation. She denies headache, focal weakness or slurred speech.  S/p cystoscopy and bladder tumor resection.  Denies hematuria, abdominal pain, bladder spasm.   MEDICAL HISTORY:  Past Medical History:  Diagnosis Date   Atherosclerosis of native arteries of extremity with intermittent claudication    Bladder mass    Chronic kidney disease, stage 3a (HCC)    COPD (chronic obstructive pulmonary disease) (HCC)    Diabetes mellitus without complication (HCC)    Gastrointestinal hemorrhage    GERD (gastroesophageal reflux disease)    Glaucoma    History of duodenal ulcer    HTN (hypertension)  Hypertension    Hypokalemia    Hypomagnesemia    Leukocytosis    Lung mass    Malignant neoplasm of lung (HCC) 06/28/2022   a.) stage IVA (cT4, cN3, cM1a) favor squamous cell carcinoma --> TPS 90%, TMB 10, KRAS G12V   Malnutrition    Normocytic anemia    Pericardial effusion 05/2022    Pulmonary embolism (HCC)    Sepsis (HCC)    Tobacco abuse    Type II diabetes mellitus with renal manifestations (HCC)    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   a.) stage I (cT1, cN0, cM0)    SURGICAL HISTORY: Past Surgical History:  Procedure Laterality Date   ABDOMINAL HYSTERECTOMY     BACK SURGERY     1988 and 1998   CYSTOSCOPY W/ RETROGRADES Bilateral 12/21/2022   Procedure: CYSTOSCOPY WITH RETROGRADE PYELOGRAM;  Surgeon: Twylla Glendia BROCKS, MD;  Location: [facility];  Service: Urology;  Laterality: Bilateral;   CYSTOSCOPY W/ URETERAL STENT PLACEMENT Right 03/29/2024   Procedure: CYSTOSCOPY, FLEXIBLE, WITH STENT REPLACEMENT;  Surgeon: Twylla Glendia BROCKS, MD;  Location: [facility];  Service: Urology;  Laterality: Right;   CYSTOSCOPY W/ URETERAL STENT REMOVAL Right 03/29/2024   Procedure: REMOVAL, STENT, URETER, CYSTOSCOPIC;  Surgeon: Twylla Glendia BROCKS, MD;  Location: [facility];  Service: Urology;  Laterality: Right;   CYSTOSCOPY WITH BIOPSY N/A 03/29/2023   Procedure: CYSTOSCOPY WITH BLADDER BIOPSY;  Surgeon: Twylla Glendia BROCKS, MD;  Location: [facility];  Service: Urology;  Laterality: N/A;   CYSTOSCOPY WITH STENT PLACEMENT Right 03/29/2023   Procedure: CYSTOSCOPY WITH STENT PLACEMENT;  Surgeon: Twylla Glendia BROCKS, MD;  Location: [facility];  Service: Urology;  Laterality: Right;   ESOPHAGOGASTRODUODENOSCOPY (EGD) WITH PROPOFOL  N/A 07/29/2022   Procedure: ESOPHAGOGASTRODUODENOSCOPY (EGD) WITH PROPOFOL ;  Surgeon: Therisa Bi, MD;  Location: [facility];  Service: Gastroenterology;  Laterality: N/A;   ESOPHAGOGASTRODUODENOSCOPY (EGD) WITH PROPOFOL  N/A 01/21/2023   Procedure: ESOPHAGOGASTRODUODENOSCOPY (EGD) WITH PROPOFOL ;  Surgeon: Therisa Bi, MD;  Location: [facility] ENDOSCOPY;  Service: Gastroenterology;  Laterality: N/A;   HOLMIUM LASER APPLICATION Right 03/29/2023   Procedure: HOLMIUM LASER ABLATION;  Surgeon: Twylla Glendia BROCKS, MD;  Location: [facility];  Service: Urology;  Laterality: Right;   IR  IMAGING GUIDED PORT INSERTION  01/10/2023   TRANSURETHRAL RESECTION OF BLADDER TUMOR N/A 12/21/2022   Procedure: TRANSURETHRAL RESECTION OF BLADDER TUMOR (TURBT);  Surgeon: Twylla Glendia BROCKS, MD;  Location: [facility];  Service: Urology;  Laterality: N/A;   TRANSURETHRAL RESECTION OF BLADDER TUMOR N/A 03/29/2023   Procedure: TRANSURETHRAL RESECTION OF BLADDER TUMOR (TURBT);  Surgeon: Twylla Glendia BROCKS, MD;  Location: [facility];  Service: Urology;  Laterality: N/A;   TRANSURETHRAL RESECTION OF BLADDER TUMOR N/A 03/29/2024   Procedure: TURBT (TRANSURETHRAL RESECTION OF BLADDER TUMOR);  Surgeon: Twylla Glendia BROCKS, MD;  Location: [facility];  Service: Urology;  Laterality: N/A;   URETERAL BIOPSY Right 03/29/2024   Procedure: BIOPSY, URETER;  Surgeon: Twylla Glendia BROCKS, MD;  Location: [facility];  Service: Urology;  Laterality: Right;  /Fulguration   URETEROSCOPY Right 03/29/2023   Procedure: DIAGNOSTIC URETEROSCOPY;  Surgeon: Twylla Glendia BROCKS, MD;  Location: [facility];  Service: Urology;  Laterality: Right;   URETEROSCOPY Right 03/29/2024   Procedure: URETEROSCOPY;  Surgeon: Twylla Glendia BROCKS, MD;  Location: [facility];  Service: Urology;  Laterality: Right;    SOCIAL HISTORY: Social History   Socioeconomic History   Marital status: Widowed    Spouse name: Not on file   Number of children: Not  on file   Years of education: Not on file   Highest education level: Not on file  Occupational History   Not on file  Tobacco Use   Smoking status: Every Day    Current packs/day: 0.25    Types: Cigarettes   Smokeless tobacco: Never  Vaping Use   Vaping status: Never Used  Substance and Sexual Activity   Alcohol use: No   Drug use: No   Sexual activity: Not on file  Other Topics Concern   Not on file  Social History Narrative   Not on file   Social Drivers of Health   Tobacco Use: High Risk (05/16/2024)   Patient History    Smoking Tobacco Use: Every Day    Smokeless Tobacco Use: Never    Passive Exposure:  Not on file  Financial Resource Strain: Low Risk (06/28/2022)   Overall Financial Resource Strain (CARDIA)    Difficulty of Paying Living Expenses: Not very hard  Food Insecurity: No Food Insecurity (12/22/2022)   Hunger Vital Sign    Worried About Running Out of Food in the Last Year: Never true    Ran Out of Food in the Last Year: Never true  Transportation Needs: No Transportation Needs (12/22/2022)   PRAPARE - Administrator, Civil Service (Medical): No    Lack of Transportation (Non-Medical): No  Physical Activity: Not on file  Stress: No Stress Concern Present (06/28/2022)   [employer] of Occupational Health - Occupational Stress Questionnaire    Feeling of Stress : Not at all  Social Connections: Not on file  Intimate Partner Violence: Not At Risk (12/22/2022)   Humiliation, Afraid, Rape, and Kick questionnaire    Fear of Current or Ex-Partner: No    Emotionally Abused: No    Physically Abused: No    Sexually Abused: No  Depression (PHQ2-9): Low Risk (05/16/2024)   Depression (PHQ2-9)    PHQ-2 Score: 0  Alcohol Screen: Not on file  Housing: Unknown (02/13/2024)   Received from [facility]   Epic    Unable to Pay for Housing in the Last Year: Not on file    Number of Times Moved in the Last Year: Not on file    At any time in the past 12 months, were you homeless or living in a shelter (including now)?: No  Utilities: Not At Risk (12/22/2022)   [facility] Utilities    Threatened with loss of utilities: No  Health Literacy: Not on file    FAMILY HISTORY: Family History  Problem Relation Age of Onset   Asthma Mother    Heart disease Mother    Hypertension Mother    Diabetes Mother    Stroke Mother    Diabetes Sister    Diabetes Brother     ALLERGIES:  is allergic to ivp dye [iodinated contrast media] and shellfish allergy.  MEDICATIONS:  Current Outpatient Medications  Medication Sig Dispense Refill   albuterol  (VENTOLIN  HFA) 108 (90  Base) MCG/ACT inhaler Inhale 2 puffs into the lungs every 4 (four) hours as needed for wheezing or shortness of breath.     amLODipine  (NORVASC ) 10 MG tablet Take 10 mg by mouth every morning.     aspirin  EC 81 MG tablet Take 1 tablet (81 mg total) by mouth daily. Swallow whole. 30 tablet 12   cyanocobalamin  (VITAMIN B12) 1000 MCG tablet Take 1 tablet (1,000 mcg total) by mouth daily. 90 tablet 0   fluticasone -salmeterol (ADVAIR) 250-50 MCG/ACT  AEPB Inhale 1 puff into the lungs.     lidocaine -prilocaine  (EMLA ) cream Apply 1 Application topically as needed. Apply to port and cover with saran wrap 1-2 hours prior to port access 30 g 1   lisinopril  (ZESTRIL ) 40 MG tablet Take 40 mg by mouth every morning.     loratadine  (CLARITIN ) 10 MG tablet Take 10 mg by mouth daily.     meloxicam (MOBIC) 7.5 MG tablet Take 7.5-15 mg by mouth daily.     OXYGEN Inhale 2 L into the lungs as directed.     No current facility-administered medications for this visit.    Review of Systems  Constitutional:  Negative for appetite change, chills, fatigue and fever.  HENT:   Negative for hearing loss and voice change.   Eyes:  Negative for eye problems.  Respiratory:  Negative for chest tightness, cough and shortness of breath.   Cardiovascular:  Negative for chest pain.  Gastrointestinal:  Negative for abdominal distention, abdominal pain and blood in stool.  Endocrine: Negative for hot flashes.  Genitourinary:  Negative for difficulty urinating and frequency.   Musculoskeletal:  Negative for arthralgias.  Skin:  Negative for itching and rash.  Neurological:  Negative for extremity weakness.  Hematological:  Negative for adenopathy.  Psychiatric/Behavioral:  Negative for confusion.      PHYSICAL EXAMINATION: ECOG PERFORMANCE STATUS: 2 - Symptomatic, <50% confined to bed  Vitals:   05/16/24 0929  BP: (!) 160/89  Pulse: 98  Resp: 16  Temp: 98.4 F (36.9 C)  SpO2: 98%    Filed Weights   05/16/24 0929   Weight: 119 lb (54 kg)      Physical Exam Constitutional:      General: She is not in acute distress.    Appearance: She is not diaphoretic.  HENT:     Head: Normocephalic.  Eyes:     General: No scleral icterus.    Pupils: Pupils are equal, round, and reactive to light.  Cardiovascular:     Rate and Rhythm: Tachycardia present.     Heart sounds: No murmur heard. Pulmonary:     Effort: Pulmonary effort is normal. No respiratory distress.     Comments: Decreased breath sounds bilaterally Abdominal:     General: There is no distension.  Musculoskeletal:        General: Normal range of motion.     Cervical back: Normal range of motion.  Skin:    Findings: No erythema.  Neurological:     Mental Status: She is alert and oriented to person, place, and time. Mental status is at baseline.     Cranial Nerves: No cranial nerve deficit.     Motor: No abnormal muscle tone.  Psychiatric:        Mood and Affect: Affect normal.      LABORATORY DATA:  I have reviewed the data as listed    Latest Ref Rng & Units 05/16/2024    9:01 AM 04/25/2024    8:19 AM 04/04/2024    9:10 AM  CBC  WBC 4.0 - 10.5 K/uL 7.3  9.5  10.8   Hemoglobin 12.0 - 15.0 g/dL 89.6  89.3  89.3   Hematocrit 36.0 - 46.0 % 32.7  32.9  33.2   Platelets 150 - 400 K/uL 405  317  294       Latest Ref Rng & Units 05/16/2024    9:01 AM 04/25/2024    8:19 AM 04/04/2024    9:10 AM  CMP  Glucose 70 - 99 mg/dL 897  821  812   BUN 8 - 23 mg/dL 17  12  14    Creatinine 0.44 - 1.00 mg/dL 8.99  9.02  8.83   Sodium 135 - 145 mmol/L 141  137  141   Potassium 3.5 - 5.1 mmol/L 3.5  3.4  3.8   Chloride 98 - 111 mmol/L 101  98  101   CO2 22 - 32 mmol/L 28  28  28    Calcium  8.9 - 10.3 mg/dL 9.8  9.4  89.9   Total Protein 6.5 - 8.1 g/dL 7.5  7.4  7.0   Total Bilirubin 0.0 - 1.2 mg/dL 0.2  0.3  0.3   Alkaline Phos 38 - 126 U/L 82  86  81   AST 15 - 41 U/L 22  23  28    ALT 0 - 44 U/L 13  13  21       RADIOGRAPHIC  STUDIES: I have personally reviewed the radiological images as listed and agreed with the findings in the report. No results found.       "

## 2024-05-16 NOTE — Patient Instructions (Signed)
 [facility] - A DEPT OF [facility]  Discharge Instructions: Thank you for choosing [facility] to provide your oncology and hematology care.  If you have a lab appointment with the Cancer Center, please go directly to the Cancer Center and check in at the registration area.  Wear comfortable clothing and clothing appropriate for easy access to any Portacath or PICC line.   We strive to give you quality time with your provider. You may need to reschedule your appointment if you arrive late (15 or more minutes).  Arriving late affects you and other patients whose appointments are after yours.  Also, if you miss three or more appointments without notifying the office, you may be dismissed from the clinic at the provider's discretion.      For prescription refill requests, have your pharmacy contact our office and allow 72 hours for refills to be completed.    Today you received the following chemotherapy and/or immunotherapy agents KEYTRUDA      To help prevent nausea and vomiting after your treatment, we encourage you to take your nausea medication as directed.  BELOW ARE SYMPTOMS THAT SHOULD BE REPORTED IMMEDIATELY: *FEVER GREATER THAN 100.4 F (38 C) OR HIGHER *CHILLS OR SWEATING *NAUSEA AND VOMITING THAT IS NOT CONTROLLED WITH YOUR NAUSEA MEDICATION *UNUSUAL SHORTNESS OF BREATH *UNUSUAL BRUISING OR BLEEDING *URINARY PROBLEMS (pain or burning when urinating, or frequent urination) *BOWEL PROBLEMS (unusual diarrhea, constipation, pain near the anus) TENDERNESS IN MOUTH AND THROAT WITH OR WITHOUT PRESENCE OF ULCERS (sore throat, sores in mouth, or a toothache) UNUSUAL RASH, SWELLING OR PAIN  UNUSUAL VAGINAL DISCHARGE OR ITCHING   Items with * indicate a potential emergency and should be followed up as soon as possible or go to the Emergency Department if any problems should occur.  Please show the CHEMOTHERAPY ALERT CARD or IMMUNOTHERAPY  ALERT CARD at check-in to the Emergency Department and triage nurse.  Should you have questions after your visit or need to cancel or reschedule your appointment, please contact [facility] - A DEPT OF [facility] [place]  [phone] and follow the prompts.  Office hours are 8:00 a.m. to 4:30 p.m. Monday - Friday. Please note that voicemails left after 4:00 p.m. may not be returned until the following business day.  We are closed weekends and major holidays. You have access to a nurse at all times for urgent questions. Please call the main number to the clinic [phone] and follow the prompts.  For any non-urgent questions, you may also contact your provider using MyChart. We now offer e-Visits for anyone 71 and older to request care online for non-urgent symptoms. For details visit [email or web address].   Also download the MyChart app! Go to the app store, search "MyChart", open the app, select [place], and log in with your MyChart username and password.   Pembrolizumab Injection What is this medication? PEMBROLIZUMAB (PEM broe LIZ ue mab) treats some types of cancer. It works by helping your immune system slow or stop the spread of cancer cells. It is a monoclonal antibody. This medicine may be used for other purposes; ask your health care provider or pharmacist if you have questions. COMMON BRAND NAME(S): Keytruda What should I tell my care team before I take this medication? They need to know if you have any of these conditions: Allogeneic stem cell transplant (uses someone else's stem cells) Autoimmune diseases, such as Crohn  disease, ulcerative colitis, lupus History of chest radiation Nervous system problems, such as Guillain-Barre syndrome, myasthenia gravis Organ transplant An unusual or allergic reaction to pembrolizumab, other medications, foods, dyes, or preservatives Pregnant or trying to get pregnant Breast-feeding How should I use this  medication? This medication is injected into a vein. It is given by your care team in a hospital or clinic setting. A special MedGuide will be given to you before each treatment. Be sure to read this information carefully each time. Talk to your care team about the use of this medication in children. While it may be prescribed for children as young as 6 months for selected conditions, precautions do apply. Overdosage: If you think you have taken too much of this medicine contact a poison control center or emergency room at once. NOTE: This medicine is only for you. Do not share this medicine with others. What if I miss a dose? Keep appointments for follow-up doses. It is important not to miss your dose. Call your care team if you are unable to keep an appointment. What may interact with this medication? Interactions have not been studied. This list may not describe all possible interactions. Give your health care provider a list of all the medicines, herbs, non-prescription drugs, or dietary supplements you use. Also tell them if you smoke, drink alcohol, or use illegal drugs. Some items may interact with your medicine. What should I watch for while using this medication? Your condition will be monitored carefully while you are receiving this medication. You may need blood work while taking this medication. This medication may cause serious skin reactions. They can happen weeks to months after starting the medication. Contact your care team right away if you notice fevers or flu-like symptoms with a rash. The rash may be red or purple and then turn into blisters or peeling of the skin. You may also notice a red rash with swelling of the face, lips, or lymph nodes in your neck or under your arms. Tell your care team right away if you have any change in your eyesight. Talk to your care team if you may be pregnant. Serious birth defects can occur if you take this medication during pregnancy and for 4  months after the last dose. You will need a negative pregnancy test before starting this medication. Contraception is recommended while taking this medication and for 4 months after the last dose. Your care team can help you find the option that works for you. Do not breastfeed while taking this medication and for 4 months after the last dose. What side effects may I notice from receiving this medication? Side effects that you should report to your care team as soon as possible: Allergic reactions--skin rash, itching, hives, swelling of the face, lips, tongue, or throat Dry cough, shortness of breath or trouble breathing Eye pain, redness, irritation, or discharge with blurry or decreased vision Heart muscle inflammation--unusual weakness or fatigue, shortness of breath, chest pain, fast or irregular heartbeat, dizziness, swelling of the ankles, feet, or hands Hormone gland problems--headache, sensitivity to light, unusual weakness or fatigue, dizziness, fast or irregular heartbeat, increased sensitivity to cold or heat, excessive sweating, constipation, hair loss, increased thirst or amount of urine, tremors or shaking, irritability Infusion reactions--chest pain, shortness of breath or trouble breathing, feeling faint or lightheaded Kidney injury (glomerulonephritis)--decrease in the amount of urine, red or dark brown urine, foamy or bubbly urine, swelling of the ankles, hands, or feet Liver injury--right upper belly pain,  loss of appetite, nausea, light-colored stool, dark yellow or brown urine, yellowing skin or eyes, unusual weakness or fatigue Pain, tingling, or numbness in the hands or feet, muscle weakness, change in vision, confusion or trouble speaking, loss of balance or coordination, trouble walking, seizures Rash, fever, and swollen lymph nodes Redness, blistering, peeling, or loosening of the skin, including inside the mouth Sudden or severe stomach pain, bloody diarrhea, fever, nausea,  vomiting Side effects that usually do not require medical attention (report to your care team if they continue or are bothersome): Bone, joint, or muscle pain Diarrhea Fatigue Loss of appetite Nausea Skin rash This list may not describe all possible side effects. Call your doctor for medical advice about side effects. You may report side effects to FDA at 1-800-FDA-1088. Where should I keep my medication? This medication is given in a hospital or clinic. It will not be stored at home. NOTE: This sheet is a summary. It may not cover all possible information. If you have questions about this medicine, talk to your doctor, pharmacist, or health care provider.  2024 [employer] Standard (2021-08-25 00:00:00)

## 2024-05-17 ENCOUNTER — Telehealth: Payer: Self-pay | Admitting: Oncology

## 2024-05-17 LAB — T4: T4, Total: 9.1 ug/dL (ref 4.5–12.0)

## 2024-05-17 NOTE — Telephone Encounter (Signed)
 Pt called to confirm appt date/time - LH

## 2024-05-29 ENCOUNTER — Other Ambulatory Visit: Payer: Self-pay | Admitting: Oncology

## 2024-06-06 ENCOUNTER — Inpatient Hospital Stay: Admitting: Oncology

## 2024-06-06 ENCOUNTER — Inpatient Hospital Stay

## 2024-06-27 ENCOUNTER — Inpatient Hospital Stay

## 2024-06-27 ENCOUNTER — Inpatient Hospital Stay: Admitting: Oncology
